# Patient Record
Sex: Male | Born: 1945 | Race: White | Hispanic: No | Marital: Married | Smoking: Former smoker
Health system: Southern US, Community
[De-identification: ages and names within clinical notes are randomized; demographics above are authoritative.]

## PROBLEM LIST (undated history)

## (undated) DIAGNOSIS — C801 Malignant (primary) neoplasm, unspecified: Secondary | ICD-10-CM

## (undated) DIAGNOSIS — G8929 Other chronic pain: Secondary | ICD-10-CM

## (undated) DIAGNOSIS — N400 Enlarged prostate without lower urinary tract symptoms: Secondary | ICD-10-CM

## (undated) DIAGNOSIS — U071 COVID-19: Secondary | ICD-10-CM

## (undated) DIAGNOSIS — M4800 Spinal stenosis, site unspecified: Secondary | ICD-10-CM

## (undated) DIAGNOSIS — M1990 Unspecified osteoarthritis, unspecified site: Secondary | ICD-10-CM

## (undated) DIAGNOSIS — R519 Headache, unspecified: Secondary | ICD-10-CM

## (undated) DIAGNOSIS — K219 Gastro-esophageal reflux disease without esophagitis: Secondary | ICD-10-CM

## (undated) DIAGNOSIS — K589 Irritable bowel syndrome without diarrhea: Secondary | ICD-10-CM

## (undated) DIAGNOSIS — Z87898 Personal history of other specified conditions: Secondary | ICD-10-CM

## (undated) DIAGNOSIS — I4891 Unspecified atrial fibrillation: Secondary | ICD-10-CM

## (undated) DIAGNOSIS — Z9889 Other specified postprocedural states: Secondary | ICD-10-CM

## (undated) DIAGNOSIS — R112 Nausea with vomiting, unspecified: Secondary | ICD-10-CM

## (undated) DIAGNOSIS — R51 Headache: Secondary | ICD-10-CM

## (undated) DIAGNOSIS — R42 Dizziness and giddiness: Secondary | ICD-10-CM

## (undated) DIAGNOSIS — M75100 Unspecified rotator cuff tear or rupture of unspecified shoulder, not specified as traumatic: Secondary | ICD-10-CM

## (undated) DIAGNOSIS — J449 Chronic obstructive pulmonary disease, unspecified: Secondary | ICD-10-CM

## (undated) HISTORY — PX: UPPER GASTROINTESTINAL ENDOSCOPY: SHX188

## (undated) HISTORY — PX: NASAL POLYP SURGERY: SHX186

## (undated) HISTORY — PX: COLONOSCOPY: SHX174

## (undated) HISTORY — PX: HERNIA REPAIR: SHX51

---

## 2004-07-04 ENCOUNTER — Ambulatory Visit: Payer: Self-pay | Admitting: Rheumatology

## 2005-04-24 ENCOUNTER — Ambulatory Visit: Payer: Self-pay | Admitting: General Practice

## 2005-05-12 ENCOUNTER — Emergency Department: Payer: Self-pay | Admitting: Emergency Medicine

## 2005-05-12 ENCOUNTER — Other Ambulatory Visit: Payer: Self-pay

## 2005-06-21 ENCOUNTER — Ambulatory Visit: Payer: Self-pay | Admitting: Pain Medicine

## 2005-06-29 ENCOUNTER — Ambulatory Visit: Payer: Self-pay | Admitting: Pain Medicine

## 2005-07-05 ENCOUNTER — Ambulatory Visit: Payer: Self-pay | Admitting: Pain Medicine

## 2005-07-27 ENCOUNTER — Ambulatory Visit: Payer: Self-pay | Admitting: Pain Medicine

## 2005-08-17 ENCOUNTER — Ambulatory Visit: Payer: Self-pay | Admitting: Pain Medicine

## 2005-08-31 ENCOUNTER — Ambulatory Visit: Payer: Self-pay | Admitting: Pain Medicine

## 2005-09-18 ENCOUNTER — Ambulatory Visit: Payer: Self-pay | Admitting: Pain Medicine

## 2005-10-03 ENCOUNTER — Encounter: Payer: Self-pay | Admitting: Pain Medicine

## 2005-10-05 ENCOUNTER — Ambulatory Visit: Payer: Self-pay | Admitting: Pain Medicine

## 2005-10-06 ENCOUNTER — Encounter: Payer: Self-pay | Admitting: Pain Medicine

## 2005-11-01 ENCOUNTER — Ambulatory Visit: Payer: Self-pay | Admitting: Pain Medicine

## 2005-11-29 ENCOUNTER — Ambulatory Visit: Payer: Self-pay | Admitting: Pain Medicine

## 2006-01-10 ENCOUNTER — Ambulatory Visit: Payer: Self-pay | Admitting: Pain Medicine

## 2006-02-07 ENCOUNTER — Ambulatory Visit: Payer: Self-pay | Admitting: Physician Assistant

## 2006-03-12 ENCOUNTER — Ambulatory Visit: Payer: Self-pay | Admitting: Pain Medicine

## 2006-04-06 ENCOUNTER — Ambulatory Visit: Payer: Self-pay | Admitting: Physician Assistant

## 2006-05-08 HISTORY — PX: NECK SURGERY: SHX720

## 2006-05-16 ENCOUNTER — Ambulatory Visit: Payer: Self-pay | Admitting: Pain Medicine

## 2006-06-14 ENCOUNTER — Ambulatory Visit: Payer: Self-pay | Admitting: Physician Assistant

## 2006-07-13 ENCOUNTER — Ambulatory Visit: Payer: Self-pay | Admitting: Physician Assistant

## 2006-08-14 ENCOUNTER — Ambulatory Visit: Payer: Self-pay | Admitting: Physician Assistant

## 2006-08-21 ENCOUNTER — Ambulatory Visit: Payer: Self-pay | Admitting: Internal Medicine

## 2006-09-14 ENCOUNTER — Ambulatory Visit: Payer: Self-pay | Admitting: Physician Assistant

## 2006-10-19 ENCOUNTER — Ambulatory Visit: Payer: Self-pay | Admitting: Physician Assistant

## 2006-11-14 ENCOUNTER — Ambulatory Visit: Payer: Self-pay | Admitting: Physician Assistant

## 2006-12-14 ENCOUNTER — Ambulatory Visit: Payer: Self-pay | Admitting: Physician Assistant

## 2006-12-28 ENCOUNTER — Ambulatory Visit: Payer: Self-pay | Admitting: Physician Assistant

## 2007-04-30 ENCOUNTER — Emergency Department: Payer: Self-pay | Admitting: Emergency Medicine

## 2007-06-18 ENCOUNTER — Ambulatory Visit: Payer: Self-pay | Admitting: Physician Assistant

## 2009-02-17 ENCOUNTER — Ambulatory Visit: Payer: Self-pay | Admitting: Internal Medicine

## 2011-04-18 ENCOUNTER — Ambulatory Visit: Payer: Self-pay | Admitting: Internal Medicine

## 2011-12-19 ENCOUNTER — Ambulatory Visit: Payer: Self-pay

## 2012-06-14 ENCOUNTER — Emergency Department: Payer: Self-pay | Admitting: Emergency Medicine

## 2012-06-14 LAB — BASIC METABOLIC PANEL
Anion Gap: 6 — ABNORMAL LOW (ref 7–16)
BUN: 22 mg/dL — ABNORMAL HIGH (ref 7–18)
Calcium, Total: 8.6 mg/dL (ref 8.5–10.1)
Chloride: 103 mmol/L (ref 98–107)
Co2: 31 mmol/L (ref 21–32)
EGFR (African American): >60
Glucose: 91 mg/dL (ref 65–99)
Potassium: 3.9 mmol/L (ref 3.5–5.1)
Sodium: 140 mmol/L (ref 136–145)

## 2012-06-14 LAB — CK TOTAL AND CKMB (NOT AT ARMC): CK, Total: 59 U/L (ref 35–232)

## 2012-06-14 LAB — CBC
Platelet: 290 10*3/uL (ref 150–440)
WBC: 7 10*3/uL (ref 3.8–10.6)

## 2012-06-14 LAB — TROPONIN I: Troponin-I: <0.02 ng/mL

## 2012-06-27 ENCOUNTER — Ambulatory Visit: Payer: Self-pay | Admitting: Otolaryngology

## 2013-04-02 ENCOUNTER — Ambulatory Visit: Payer: Self-pay | Admitting: Otolaryngology

## 2014-02-09 ENCOUNTER — Ambulatory Visit: Payer: Self-pay | Admitting: Otolaryngology

## 2014-03-18 ENCOUNTER — Ambulatory Visit: Payer: Self-pay | Admitting: Unknown Physician Specialty

## 2014-03-20 LAB — EXPECTORATED SPUTUM ASSESSMENT W REFEX TO RESP CULTURE

## 2014-10-08 ENCOUNTER — Other Ambulatory Visit: Payer: Self-pay | Admitting: Otolaryngology

## 2014-10-08 DIAGNOSIS — R5382 Chronic fatigue, unspecified: Secondary | ICD-10-CM

## 2014-10-08 DIAGNOSIS — R911 Solitary pulmonary nodule: Secondary | ICD-10-CM

## 2014-10-08 DIAGNOSIS — R059 Cough, unspecified: Secondary | ICD-10-CM

## 2014-10-08 DIAGNOSIS — R05 Cough: Secondary | ICD-10-CM

## 2014-10-08 DIAGNOSIS — J320 Chronic maxillary sinusitis: Secondary | ICD-10-CM

## 2014-10-14 ENCOUNTER — Ambulatory Visit: Admission: RE | Admit: 2014-10-14 | Payer: BLUE CROSS/BLUE SHIELD | Source: Ambulatory Visit

## 2014-10-14 ENCOUNTER — Ambulatory Visit
Admission: RE | Admit: 2014-10-14 | Discharge: 2014-10-14 | Disposition: A | Discharge status: Home / Self Care | Payer: BLUE CROSS/BLUE SHIELD | Source: Ambulatory Visit | Attending: Otolaryngology | Admitting: Otolaryngology

## 2014-10-14 ENCOUNTER — Ambulatory Visit: Payer: BLUE CROSS/BLUE SHIELD

## 2014-10-14 DIAGNOSIS — R911 Solitary pulmonary nodule: Secondary | ICD-10-CM | POA: Diagnosis present

## 2014-10-14 DIAGNOSIS — J320 Chronic maxillary sinusitis: Secondary | ICD-10-CM

## 2014-10-14 DIAGNOSIS — J439 Emphysema, unspecified: Secondary | ICD-10-CM | POA: Insufficient documentation

## 2014-10-14 DIAGNOSIS — R059 Cough, unspecified: Secondary | ICD-10-CM

## 2014-10-14 DIAGNOSIS — R918 Other nonspecific abnormal finding of lung field: Secondary | ICD-10-CM | POA: Diagnosis not present

## 2014-10-14 DIAGNOSIS — I700 Atherosclerosis of aorta: Secondary | ICD-10-CM | POA: Diagnosis not present

## 2014-10-14 DIAGNOSIS — R5383 Other fatigue: Secondary | ICD-10-CM | POA: Diagnosis present

## 2014-10-14 DIAGNOSIS — R05 Cough: Secondary | ICD-10-CM

## 2014-10-14 DIAGNOSIS — R5382 Chronic fatigue, unspecified: Secondary | ICD-10-CM

## 2015-02-05 DIAGNOSIS — Z8719 Personal history of other diseases of the digestive system: Secondary | ICD-10-CM | POA: Insufficient documentation

## 2015-02-05 DIAGNOSIS — K219 Gastro-esophageal reflux disease without esophagitis: Secondary | ICD-10-CM | POA: Insufficient documentation

## 2015-02-05 DIAGNOSIS — K581 Irritable bowel syndrome with constipation: Secondary | ICD-10-CM | POA: Insufficient documentation

## 2015-02-05 DIAGNOSIS — Z87898 Personal history of other specified conditions: Secondary | ICD-10-CM | POA: Insufficient documentation

## 2015-02-05 DIAGNOSIS — Z86010 Personal history of colonic polyps: Secondary | ICD-10-CM | POA: Insufficient documentation

## 2015-04-02 DIAGNOSIS — E781 Pure hyperglyceridemia: Secondary | ICD-10-CM | POA: Insufficient documentation

## 2015-05-26 ENCOUNTER — Encounter: Payer: Self-pay | Admitting: *Deleted

## 2015-06-01 NOTE — Discharge Instructions (Signed)
[facility] °[facility] °ENDOSCOPIC SINUS SURGERY °[facility] ° °What is Functional Endoscopic Sinus Surgery? ° The Surgery involves making the natural openings of the sinuses larger by removing the bony partitions that separate the sinuses from the nasal cavity.  The natural sinus lining is preserved as much as possible to allow the sinuses to resume normal function after the surgery.  In some patients nasal polyps (excessively swollen lining of the sinuses) may be removed to relieve obstruction of the sinus openings.  The surgery is performed through the nose using lighted scopes, which eliminates the need for incisions on the face.  A septoplasty is a different procedure which is sometimes performed with sinus surgery.  It involves straightening the boy partition that separates the two sides of your nose.  A crooked or deviated septum may need repair if is obstructing the sinuses or nasal airflow.  Turbinate reduction is also often performed during sinus surgery.  The turbinates are bony proturberances from the side walls of the nose which swell and can obstruct the nose in patients with sinus and allergy problems.  Their size can be surgically reduced to help relieve nasal obstruction. ° °What Can Sinus Surgery Do For Me? ° Sinus surgery can reduce the frequency of sinus infections requiring antibiotic treatment.  This can provide improvement in nasal congestion, post-nasal drainage, facial pressure and nasal obstruction.  Surgery will NOT prevent you from ever having an infection again, so it usually only for patients who get infections 4 or more times yearly requiring antibiotics, or for infections that do not clear with antibiotics.  It will not cure nasal allergies, so patients with allergies may still require medication to treat their allergies after surgery. Surgery may improve headaches related to sinusitis, however, some people will continue to  require medication to control sinus headaches related to allergies.  Surgery will do nothing for other forms of headache (migraine, tension or cluster). ° °What Are the Risks of Endoscopic Sinus Surgery? ° Current techniques allow surgery to be performed safely with little risk, however, there are rare complications that patients should be aware of.  Because the sinuses are located around the eyes, there is risk of eye injury, including blindness, though again, this would be quite rare. This is usually a result of bleeding behind the eye during surgery, which puts the vision oat risk, though there are treatments to protect the vision and prevent permanent disrupted by surgery causing a leak of the spinal fluid that surrounds the brain.  More serious complications would include bleeding inside the brain cavity or damage to the brain.  Again, all of these complications are uncommon, and spinal fluid leaks can be safely managed surgically if they occur.  The most common complication of sinus surgery is bleeding from the nose, which may require packing or cauterization of the nose.  Continued sinus have polyps may experience recurrence of the polyps requiring revision surgery.  Alterations of sense of smell or injury to the tear ducts are also rare complications.  ° °What is the Surgery Like, and what is the Recovery? ° The Surgery usually takes a couple of hours to perform, and is usually performed under a general anesthetic (completely asleep).  Patients are usually discharged home after a couple of hours.  Sometimes during surgery it is necessary to pack the nose to control bleeding, and the packing is left in place for 24 - 48 hours, and removed by your surgeon.    If a septoplasty was performed during the procedure, there is often a splint placed which must be removed after 5-7 days.   °Discomfort: Pain is usually mild to moderate, and can be controlled by prescription pain medication or acetaminophen (Tylenol).   Aspirin, Ibuprofen (Advil, Motrin), or Naprosyn (Aleve) should be avoided, as they can cause increased bleeding.  Most patients feel sinus pressure like they have a bad head cold for several days.  Sleeping with your head elevated can help reduce swelling and facial pressure, as can ice packs over the face.  A humidifier may be helpful to keep the mucous and blood from drying in the nose.  ° °Diet: There are no specific diet restrictions, however, you should generally start with clear liquids and a light diet of bland foods because the anesthetic can cause some nausea.  Advance your diet depending on how your stomach feels.  Taking your pain medication with food will often help reduce stomach upset which pain medications can cause. ° °Nasal Saline Irrigation: It is important to remove blood clots and dried mucous from the nose as it is healing.  This is done by having you irrigate the nose at least 3 - 4 times daily with a salt water solution.  We recommend using NeilMed Sinus Rinse (available at the drug store).  Fill the squeeze bottle with the solution, bend over a sink, and insert the tip of the squeeze bottle into the nose ½ of an inch.  Point the tip of the squeeze bottle towards the inside corner of the eye on the same side your irrigating.  Squeeze the bottle and gently irrigate the nose.  If you bend forward as you do this, most of the fluid will flow back out of the nose, instead of down your throat.   The solution should be warm, near body temperature, when you irrigate.   Each time you irrigate, you should use a full squeeze bottle.  ° °Note that if you are instructed to use Nasal Steroid Sprays at any time after your surgery, irrigate with saline BEFORE using the steroid spray, so you do not wash it all out of the nose. °Another product, Nasal Saline Gel (such as AYR Nasal Saline Gel) can be applied in each nostril 3 - 4 times daily to moisture the nose and reduce scabbing or crusting. ° °Bleeding:   Bloody drainage from the nose can be expected for several days, and patients are instructed to irrigate their nose frequently with salt water to help remove mucous and blood clots.  The drainage may be dark red or brown, though some fresh blood may be seen intermittently, especially after irrigation.  Do not blow you nose, as bleeding may occur. If you must sneeze, keep your mouth open to allow air to escape through your mouth. ° °If heavy bleeding occurs: Irrigate the nose with saline to rinse out clots, then spray the nose 3 - 4 times with Afrin Nasal Decongestant Spray.  The spray will constrict the blood vessels to slow bleeding.  Pinch the lower half of your nose shut to apply pressure, and lay down with your head elevated.  Ice packs over the nose may help as well. If bleeding persists despite these measures, you should notify your doctor.  Do not use the Afrin routinely to control nasal congestion after surgery, as it can result in worsening congestion and may affect healing.  ° ° ° °Activity: Return to work varies among patients. Most patients will be   out of work at least 5 - 7 days to recover.  Patient may return to work after they are off of narcotic pain medication, and feeling well enough to perform the functions of their job.  Patients must avoid heavy lifting (over 10 pounds) or strenuous physical for 2 weeks after surgery, so your employer may need to assign you to light duty, or keep you out of work longer if light duty is not possible.  NOTE: you should not drive, operate dangerous machinery, do any mentally demanding tasks or make any important legal or financial decisions while on narcotic pain medication and recovering from the general anesthetic.  °  °Call Your Doctor Immediately if You Have Any of the Following: °1. Bleeding that you cannot control with the above measures °2. Loss of vision, double vision, bulging of the eye or black eyes. °3. Fever over 101 degrees °4. Neck stiffness with  severe headache, fever, nausea and change in mental state. °You are always encourage to call anytime with concerns, however, please call with requests for pain medication refills during office hours. ° °Office Endoscopy: During follow-up visits your doctor will remove any packing or splints that may have been placed and evaluate and clean your sinuses endoscopically.  Topical anesthetic will be used to make this as comfortable as possible, though you may want to take your pain medication prior to the visit.  How often this will need to be done varies from patient to patient.  After complete recovery from the surgery, you may need follow-up endoscopy from time to time, particularly if there is concern of recurrent infection or nasal polyps. ° °General Anesthesia, Adult, Care After °Refer to this sheet in the next few weeks. These instructions provide you with information on caring for yourself after your procedure. Your health care provider may also give you more specific instructions. Your treatment has been planned according to current medical practices, but problems sometimes occur. Call your health care provider if you have any problems or questions after your procedure. °WHAT TO EXPECT AFTER THE PROCEDURE °After the procedure, it is typical to experience: °· Sleepiness. °· Nausea and vomiting. °HOME CARE INSTRUCTIONS °· For the first 24 hours after general anesthesia: °¨ Have a responsible person with you. °¨ Do not drive a car. If you are alone, do not take public transportation. °¨ Do not drink alcohol. °¨ Do not take medicine that has not been prescribed by your health care provider. °¨ Do not sign important papers or make important decisions. °¨ You may resume a normal diet and activities as directed by your health care provider. °· Change bandages (dressings) as directed. °· If you have questions or problems that seem related to general anesthesia, call the hospital and ask for the anesthetist or  anesthesiologist on call. °SEEK MEDICAL CARE IF: °· You have nausea and vomiting that continue the day after anesthesia. °· You develop a rash. °SEEK IMMEDIATE MEDICAL CARE IF:  °· You have difficulty breathing. °· You have chest pain. °· You have any allergic problems. °  °This information is not intended to replace advice given to you by your health care provider. Make sure you discuss any questions you have with your health care provider. °  °Document Released: 07/31/2000 Document Revised: 05/15/2014 Document Reviewed: 08/23/2011 °Elsevier Interactive Patient Education ©2016 [employer] ° °

## 2015-06-02 ENCOUNTER — Ambulatory Visit: Payer: BLUE CROSS/BLUE SHIELD | Admitting: Anesthesiology

## 2015-06-02 ENCOUNTER — Encounter
Admission: RE | Disposition: A | Discharge status: Home / Self Care | Payer: Self-pay | Source: Ambulatory Visit | Attending: Otolaryngology

## 2015-06-02 ENCOUNTER — Ambulatory Visit
Admission: RE | Admit: 2015-06-02 | Discharge: 2015-06-02 | Disposition: A | Discharge status: Home / Self Care | Payer: BLUE CROSS/BLUE SHIELD | Source: Ambulatory Visit | Attending: Otolaryngology | Admitting: Otolaryngology

## 2015-06-02 DIAGNOSIS — K219 Gastro-esophageal reflux disease without esophagitis: Secondary | ICD-10-CM | POA: Diagnosis not present

## 2015-06-02 DIAGNOSIS — J449 Chronic obstructive pulmonary disease, unspecified: Secondary | ICD-10-CM | POA: Diagnosis not present

## 2015-06-02 DIAGNOSIS — F17200 Nicotine dependence, unspecified, uncomplicated: Secondary | ICD-10-CM | POA: Insufficient documentation

## 2015-06-02 DIAGNOSIS — J329 Chronic sinusitis, unspecified: Secondary | ICD-10-CM | POA: Insufficient documentation

## 2015-06-02 HISTORY — DX: Headache, unspecified: R51.9

## 2015-06-02 HISTORY — PX: EXCISION CHONCHA BULLOSA: SHX6435

## 2015-06-02 HISTORY — DX: Nausea with vomiting, unspecified: R11.2

## 2015-06-02 HISTORY — DX: Dizziness and giddiness: R42

## 2015-06-02 HISTORY — DX: Unspecified rotator cuff tear or rupture of unspecified shoulder, not specified as traumatic: M75.100

## 2015-06-02 HISTORY — DX: Headache: R51

## 2015-06-02 HISTORY — DX: Gastro-esophageal reflux disease without esophagitis: K21.9

## 2015-06-02 HISTORY — DX: Unspecified osteoarthritis, unspecified site: M19.90

## 2015-06-02 HISTORY — DX: Other specified postprocedural states: Z98.890

## 2015-06-02 HISTORY — DX: Chronic obstructive pulmonary disease, unspecified: J44.9

## 2015-06-02 HISTORY — PX: MAXILLARY ANTROSTOMY: SHX2003

## 2015-06-02 SURGERY — MAXILLARY ANTROSTOMY
Anesthesia: General | Site: Nose | Laterality: Bilateral | Wound class: Clean Contaminated

## 2015-06-02 MED ORDER — LACTATED RINGERS IV SOLN
INTRAVENOUS | Status: DC
  Administered 2015-06-02: 09:00:00 via INTRAVENOUS

## 2015-06-02 MED ORDER — DEXAMETHASONE SODIUM PHOSPHATE 4 MG/ML IJ SOLN
INTRAMUSCULAR | Status: DC | PRN
  Administered 2015-06-02: 8 mg via INTRAVENOUS

## 2015-06-02 MED ORDER — PROMETHAZINE HCL 12.5 MG PO TABS: 12.5000 mg | ORAL_TABLET | Freq: Four times a day (QID) | ORAL | Status: DC | PRN

## 2015-06-02 MED ORDER — ONDANSETRON HCL 4 MG/2ML IJ SOLN
4.0000 mg | Freq: Once | INTRAMUSCULAR | Status: AC
  Administered 2015-06-02: 4 mg via INTRAVENOUS

## 2015-06-02 MED ORDER — LACTATED RINGERS IV SOLN: 500.0000 mL | INTRAVENOUS | Status: DC

## 2015-06-02 MED ORDER — FENTANYL CITRATE (PF) 100 MCG/2ML IJ SOLN
INTRAMUSCULAR | Status: DC | PRN
  Administered 2015-06-02: 100 ug via INTRAVENOUS

## 2015-06-02 MED ORDER — PROPOFOL 10 MG/ML IV BOLUS
INTRAVENOUS | Status: DC | PRN
  Administered 2015-06-02: 150 mg via INTRAVENOUS

## 2015-06-02 MED ORDER — ROCURONIUM BROMIDE 100 MG/10ML IV SOLN
INTRAVENOUS | Status: DC | PRN
  Administered 2015-06-02: 20 mg via INTRAVENOUS

## 2015-06-02 MED ORDER — SUCCINYLCHOLINE CHLORIDE 20 MG/ML IJ SOLN
INTRAMUSCULAR | Status: DC | PRN
  Administered 2015-06-02: 100 mg via INTRAVENOUS

## 2015-06-02 MED ORDER — OXYCODONE HCL 5 MG/5ML PO SOLN: 5.0000 mg | Freq: Once | ORAL | Status: AC | PRN

## 2015-06-02 MED ORDER — ALBUTEROL SULFATE HFA 108 (90 BASE) MCG/ACT IN AERS
2.0000 | INHALATION_SPRAY | RESPIRATORY_TRACT | Status: AC
  Administered 2015-06-02: 2 via RESPIRATORY_TRACT

## 2015-06-02 MED ORDER — ACETAMINOPHEN 10 MG/ML IV SOLN
1000.0000 mg | Freq: Once | INTRAVENOUS | Status: AC
  Administered 2015-06-02: 1000 mg via INTRAVENOUS

## 2015-06-02 MED ORDER — LIDOCAINE HCL (CARDIAC) 20 MG/ML IV SOLN
INTRAVENOUS | Status: DC | PRN
  Administered 2015-06-02: 50 mg via INTRAVENOUS

## 2015-06-02 MED ORDER — OXYCODONE-ACETAMINOPHEN 5-325 MG PO TABS: 1.0000 | ORAL_TABLET | ORAL | Status: DC | PRN

## 2015-06-02 MED ORDER — HYDROMORPHONE HCL 1 MG/ML IJ SOLN
0.2500 mg | INTRAMUSCULAR | Status: DC | PRN
  Administered 2015-06-02 (×2): 0.3 mg via INTRAVENOUS
  Administered 2015-06-02: 0.4 mg via INTRAVENOUS

## 2015-06-02 MED ORDER — ONDANSETRON HCL 4 MG/2ML IJ SOLN
4.0000 mg | Freq: Once | INTRAMUSCULAR | Status: AC | PRN
  Administered 2015-06-02: 4 mg via INTRAVENOUS

## 2015-06-02 MED ORDER — FENTANYL CITRATE (PF) 100 MCG/2ML IJ SOLN
25.0000 ug | INTRAMUSCULAR | Status: DC | PRN
  Administered 2015-06-02 (×4): 25 ug via INTRAVENOUS

## 2015-06-02 MED ORDER — MIDAZOLAM HCL 5 MG/5ML IJ SOLN
INTRAMUSCULAR | Status: DC | PRN
  Administered 2015-06-02: 2 mg via INTRAVENOUS

## 2015-06-02 MED ORDER — OXYMETAZOLINE HCL 0.05 % NA SOLN
NASAL | Status: DC | PRN
  Administered 2015-06-02: 1 via TOPICAL

## 2015-06-02 MED ORDER — OXYCODONE HCL 5 MG PO TABS
5.0000 mg | ORAL_TABLET | Freq: Once | ORAL | Status: AC | PRN
  Administered 2015-06-02: 5 mg via ORAL

## 2015-06-02 MED ORDER — SULFAMETHOXAZOLE-TRIMETHOPRIM 800-160 MG PO TABS: 1.0000 | ORAL_TABLET | Freq: Two times a day (BID) | ORAL | Status: DC

## 2015-06-02 MED ORDER — LIDOCAINE-EPINEPHRINE 1 %-1:100000 IJ SOLN
INTRAMUSCULAR | Status: DC | PRN
  Administered 2015-06-02: 4 mL

## 2015-06-02 SURGICAL SUPPLY — 25 items
BATTERY INSTRU NAVIGATION (MISCELLANEOUS) ×12 IMPLANT
CANISTER SUCT 1200ML W/VALVE (MISCELLANEOUS) ×3 IMPLANT
COAG SUCT 10F 3.5MM HAND CTRL (MISCELLANEOUS) ×3 IMPLANT
DEVICE INFLATION 20/61 (MISCELLANEOUS) IMPLANT
DRAPE HEAD BAR (DRAPES) ×3 IMPLANT
DRESSING NASL FOAM PST OP SINU (MISCELLANEOUS) IMPLANT
DRSG NASAL 4CM NASOPORE (MISCELLANEOUS) IMPLANT
DRSG NASAL FOAM POST OP SINU (MISCELLANEOUS)
GLOVE BIO SURGEON STRL SZ7.5 (GLOVE) ×9 IMPLANT
IRRIGATOR 4MM STR (IRRIGATION / IRRIGATOR) ×3 IMPLANT
IV NS 500ML (IV SOLUTION) ×2
IV NS 500ML BAXH (IV SOLUTION) ×1 IMPLANT
KIT ROOM TURNOVER OR (KITS) ×3 IMPLANT
NAVIGATION MASK REG  ST (MISCELLANEOUS) ×3 IMPLANT
NS IRRIG 500ML POUR BTL (IV SOLUTION) ×3 IMPLANT
PACK DRAPE NASAL/ENT (PACKS) ×3 IMPLANT
PACKING NASAL EPIS 4X2.4 XEROG (MISCELLANEOUS) IMPLANT
PAD GROUND ADULT SPLIT (MISCELLANEOUS) ×3 IMPLANT
PATTIES SURGICAL .5 X3 (DISPOSABLE) ×3 IMPLANT
SET HANDPIECE IRR DIEGO (MISCELLANEOUS) ×3 IMPLANT
SOL ANTI-FOG 6CC FOG-OUT (MISCELLANEOUS) ×1 IMPLANT
SOL FOG-OUT ANTI-FOG 6CC (MISCELLANEOUS) ×2
STRAP BODY AND KNEE 60X3 (MISCELLANEOUS) ×6 IMPLANT
SYRINGE 10CC LL (SYRINGE) ×3 IMPLANT
WATER STERILE IRR 500ML POUR (IV SOLUTION) IMPLANT

## 2015-06-02 NOTE — Anesthesia Postprocedure Evaluation (Signed)
Anesthesia Post Note  Patient: Riley Martin  Procedure(s) Performed: Procedure(s) (LRB): MAXILLARY ANTROSTOMY (Bilateral) BILATERAL CHONCHA BULLOSA (Bilateral)  Patient location during evaluation: PACU Anesthesia Type: General Level of consciousness: awake and alert Pain management: pain level controlled Vital Signs Assessment: post-procedure vital signs reviewed and stable Respiratory status: spontaneous breathing, nonlabored ventilation and respiratory function stable Cardiovascular status: blood pressure returned to baseline and stable Postop Assessment: no signs of nausea or vomiting Anesthetic complications: no    DANIEL D KOVACS

## 2015-06-02 NOTE — Progress Notes (Signed)
MD vaught notified that pt stating pain was 10/10. No new orders at this time

## 2015-06-02 NOTE — Progress Notes (Signed)
Pt stating his pain was much improved with pain medicine. Pt stated that he normally drinks coffee everyday, pt given 2 cups of coffee stating he believed that may have helped his headache

## 2015-06-02 NOTE — Anesthesia Preprocedure Evaluation (Addendum)
Anesthesia Evaluation  Patient identified by MRN, date of birth, ID band Patient awake    Reviewed: Allergy & Precautions, H&P , NPO status , Patient's Chart, lab work & pertinent test results, reviewed documented beta blocker date and time   History of Anesthesia Complications (+) PONV and history of anesthetic complications  Airway Mallampati: II  TM Distance: >3 FB Neck ROM: full    Dental no notable dental hx.    Pulmonary COPD,  COPD inhaler, Current Smoker,   Mild expiratory wheeze at the bases B/L Pulmonary exam normal  + wheezing      Cardiovascular Exercise Tolerance: Good negative cardio ROS   Rhythm:regular Rate:Normal     Neuro/Psych  Headaches, negative psych ROS   GI/Hepatic Neg liver ROS, GERD  Medicated,  Endo/Other  negative endocrine ROS  Renal/GU negative Renal ROS  negative genitourinary   Musculoskeletal   Abdominal   Peds  Hematology negative hematology ROS (+)   Anesthesia Other Findings   Reproductive/Obstetrics negative OB ROS                            Anesthesia Physical Anesthesia Plan  ASA: II  Anesthesia Plan: General   Post-op Pain Management:    Induction:   Airway Management Planned:   Additional Equipment:   Intra-op Plan:   Post-operative Plan:   Informed Consent: I have reviewed the patients History and Physical, chart, labs and discussed the procedure including the risks, benefits and alternatives for the proposed anesthesia with the patient or authorized representative who has indicated his/her understanding and acceptance.   Dental Advisory Given  Plan Discussed with: CRNA  Anesthesia Plan Comments:         Anesthesia Quick Evaluation

## 2015-06-02 NOTE — Progress Notes (Signed)
Patient stating his pain is 10/10 but noted to fall asleep and sats drop to 88%. Will continue to monitor.

## 2015-06-02 NOTE — Transfer of Care (Signed)
Immediate Anesthesia Transfer of Care Note  Patient: Riley Martin  Procedure(s) Performed: Procedure(s): MAXILLARY ANTROSTOMY (Bilateral) BILATERAL CHONCHA BULLOSA (Bilateral)  Patient Location: PACU  Anesthesia Type: General  Level of Consciousness: awake, alert  and patient cooperative  Airway and Oxygen Therapy: Patient Spontanous Breathing and Patient connected to supplemental oxygen  Post-op Assessment: Post-op Vital signs reviewed, Patient's Cardiovascular Status Stable, Respiratory Function Stable, Patent Airway and No signs of Nausea or vomiting  Post-op Vital Signs: Reviewed and stable  Complications: No apparent anesthesia complications

## 2015-06-02 NOTE — Addendum Note (Signed)
Addendum  created 06/02/15 1155 by Esmeralda Links, MD   Modules edited: Orders, PRL Based Order Sets

## 2015-06-02 NOTE — Op Note (Signed)
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  The patient was taken to the operating room and with the patient in a comfortable supine position,  general orotracheal anesthesia was induced without difficulty.  A proper time-out was performed.  The Stryker image guidance system was set up and calibrated in the normal fashion with an acceptable error of 0.70mm.       The patient next received preoperative Afrin spray for topical decongestion and vasoconstriction and 1% Xylocaine with 1:100,000 epinephrine, 4 cc's, was infiltrated into the inferior turbinates, septum, and anterior middle turbinates bilaterally.  Several minutes were allowed for this to take effect.  Cottoniod pledgets soaked in Afrin were placed into both nasal cavities and left while the patient was prepped and draped in the standard fashion.  The materials were removed from the nose and observed to be intact and correct in number.  The nose was next inspected with a zero degree endoscope and the concha bullosa was noted to be blocking the airway bilaterally. Afin soaked pledgets were placed lateral to the turbinates for approximately one minute.  After this, a Soil scientist was used to open the left concha bullosa and the lateral wall was removed with Grimwald forceps.  Mucoid and polypoid contents was noted.  Hemostasis  was achieved with a Bovie suction cautery and the remaining medial wall of the concha bullosa was medialized and Afrin soaked pledgets were placed.  The patient's large obstructing right concha bullosa was opened in a the exact same manner with similar findings except no polypoid contents was noted.  At this time attention was directed to the patient's maxillary sinuses.  The patient's uncinate process was brought forward and infractured bilaterally with a ball tipped probe.  Using a pediatric backbiting forcep, the uncinate was removed bilaterally.  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  On the left, a ball tipped probe was placed through the natural ostia and this was used to create a larger opening.  Using a Diego microdebrider, the maxillary antrostomy was enlarged for a widely patent maxillary antrostomy.  This was repeated in a simlar fashion on the patient's right side.  Hemostasis was performed with topical Afrin soaked pledgets.  Visualization with a 30 degree endoscope was used to examine the bilateral maxillary antrostomies which were noted to be widely patent and in continuity with the natural os bilaterally.  At this time with all diseased sinuses opened, the patient's nasal cavity was examinated and copiously irrigated with sterile saline.  Meticulous hemostasis was continued and all sinuses were examined and noted to be widely patent.  Stamberger sinufoam was placed into the patient's open sinuses as well as lateral to the middle turbinate bilaterally.  Xerogel was placed lateral to the residual middle turbinate bilaterally to ensure proper medialization.  Hemostasis was continued and care of the patient was transferred to Anesthesia.  Dispo:   PACU to home  Plan: Ice, elevation, narcotic analgesia and prophylactic antibiotics f  We will reevaluate the patient in the office in 7 days.  Return to work in 7-10 days, strenuous activities in two  weeks.   Riley Martin 06/02/2015 10:53 AM

## 2015-06-02 NOTE — Anesthesia Procedure Notes (Addendum)
Procedure Name: Intubation Date/Time: 06/02/2015 9:43 AM Performed by: Londell Moh Pre-anesthesia Checklist: Patient identified, Emergency Drugs available, Suction available, Patient being monitored and Timeout performed Patient Re-evaluated:Patient Re-evaluated prior to inductionOxygen Delivery Method: Circle system utilized Preoxygenation: Pre-oxygenation with 100% oxygen Intubation Type: IV induction Ventilation: Mask ventilation without difficulty Laryngoscope Size: Mac and 3 Grade View: Grade I Tube type: Oral Rae Tube size: 7.5 mm Number of attempts: 1 Airway Equipment and Method: LTA kit utilized Placement Confirmation: ETT inserted through vocal cords under direct vision,  positive ETCO2 and breath sounds checked- equal and bilateral Tube secured with: Tape Dental Injury: Teeth and Oropharynx as per pre-operative assessment

## 2015-06-03 ENCOUNTER — Encounter: Payer: Self-pay | Admitting: Otolaryngology

## 2015-06-03 NOTE — H&P (Signed)
..  History and Physical paper copy reviewed and updated date of procedure and will be scanned into system.  

## 2015-06-04 LAB — SURGICAL PATHOLOGY

## 2015-12-17 ENCOUNTER — Other Ambulatory Visit: Payer: Self-pay | Admitting: Family Medicine

## 2015-12-17 DIAGNOSIS — R634 Abnormal weight loss: Secondary | ICD-10-CM

## 2015-12-21 ENCOUNTER — Ambulatory Visit
Admission: RE | Admit: 2015-12-21 | Discharge: 2015-12-21 | Disposition: A | Discharge status: Home / Self Care | Payer: BLUE CROSS/BLUE SHIELD | Source: Ambulatory Visit | Attending: Family Medicine | Admitting: Family Medicine

## 2015-12-21 DIAGNOSIS — M5136 Other intervertebral disc degeneration, lumbar region: Secondary | ICD-10-CM | POA: Insufficient documentation

## 2015-12-21 DIAGNOSIS — R634 Abnormal weight loss: Secondary | ICD-10-CM

## 2015-12-21 DIAGNOSIS — I700 Atherosclerosis of aorta: Secondary | ICD-10-CM | POA: Diagnosis not present

## 2015-12-21 DIAGNOSIS — K5730 Diverticulosis of large intestine without perforation or abscess without bleeding: Secondary | ICD-10-CM | POA: Diagnosis not present

## 2015-12-24 ENCOUNTER — Emergency Department
Admission: EM | Admit: 2015-12-24 | Discharge: 2015-12-24 | Disposition: A | Discharge status: Home / Self Care | Payer: BLUE CROSS/BLUE SHIELD | Attending: Emergency Medicine | Admitting: Emergency Medicine

## 2015-12-24 ENCOUNTER — Encounter: Payer: Self-pay | Admitting: Emergency Medicine

## 2015-12-24 DIAGNOSIS — J449 Chronic obstructive pulmonary disease, unspecified: Secondary | ICD-10-CM | POA: Diagnosis not present

## 2015-12-24 DIAGNOSIS — F17200 Nicotine dependence, unspecified, uncomplicated: Secondary | ICD-10-CM | POA: Diagnosis not present

## 2015-12-24 DIAGNOSIS — R531 Weakness: Secondary | ICD-10-CM | POA: Diagnosis present

## 2015-12-24 DIAGNOSIS — R112 Nausea with vomiting, unspecified: Secondary | ICD-10-CM | POA: Insufficient documentation

## 2015-12-24 DIAGNOSIS — Z79899 Other long term (current) drug therapy: Secondary | ICD-10-CM | POA: Insufficient documentation

## 2015-12-24 DIAGNOSIS — R197 Diarrhea, unspecified: Secondary | ICD-10-CM | POA: Insufficient documentation

## 2015-12-24 LAB — URINALYSIS COMPLETE WITH MICROSCOPIC (ARMC ONLY)
BACTERIA UA: NONE SEEN
Bilirubin Urine: NEGATIVE
GLUCOSE, UA: NEGATIVE mg/dL
HGB URINE DIPSTICK: NEGATIVE
Ketones, ur: NEGATIVE mg/dL
Leukocytes, UA: NEGATIVE
NITRITE: NEGATIVE
PH: 5 (ref 5.0–8.0)
PROTEIN: NEGATIVE mg/dL
SPECIFIC GRAVITY, URINE: 1.02 (ref 1.005–1.030)

## 2015-12-24 LAB — BASIC METABOLIC PANEL
ANION GAP: 7 (ref 5–15)
BUN: 22 mg/dL — ABNORMAL HIGH (ref 6–20)
CO2: 26 mmol/L (ref 22–32)
Calcium: 8.9 mg/dL (ref 8.9–10.3)
Chloride: 105 mmol/L (ref 101–111)
Creatinine, Ser: 1.17 mg/dL (ref 0.61–1.24)
GLUCOSE: 155 mg/dL — AB (ref 65–99)
POTASSIUM: 4 mmol/L (ref 3.5–5.1)
Sodium: 138 mmol/L (ref 135–145)

## 2015-12-24 LAB — CBC
HEMATOCRIT: 40.7 % (ref 40.0–52.0)
HEMOGLOBIN: 14.1 g/dL (ref 13.0–18.0)
MCH: 33.7 pg (ref 26.0–34.0)
MCHC: 34.6 g/dL (ref 32.0–36.0)
MCV: 97.2 fL (ref 80.0–100.0)
Platelets: 253 10*3/uL (ref 150–440)
RBC: 4.19 MIL/uL — AB (ref 4.40–5.90)
RDW: 12.3 % (ref 11.5–14.5)
WBC: 9.1 10*3/uL (ref 3.8–10.6)

## 2015-12-24 LAB — HEPATIC FUNCTION PANEL
ALBUMIN: 3.8 g/dL (ref 3.5–5.0)
ALK PHOS: 50 U/L (ref 38–126)
ALT: 10 U/L — ABNORMAL LOW (ref 17–63)
AST: 19 U/L (ref 15–41)
Bilirubin, Direct: <0.1 mg/dL — ABNORMAL LOW (ref 0.1–0.5)
TOTAL PROTEIN: 7.2 g/dL (ref 6.5–8.1)
Total Bilirubin: 0.4 mg/dL (ref 0.3–1.2)

## 2015-12-24 LAB — LIPASE, BLOOD: LIPASE: 13 U/L (ref 11–51)

## 2015-12-24 MED ORDER — SODIUM CHLORIDE 0.9 % IV BOLUS (SEPSIS)
1000.0000 mL | Freq: Once | INTRAVENOUS | Status: AC
  Administered 2015-12-24: 1000 mL via INTRAVENOUS

## 2015-12-24 MED ORDER — DICYCLOMINE HCL 10 MG/ML IM SOLN
20.0000 mg | Freq: Once | INTRAMUSCULAR | Status: AC
  Administered 2015-12-24: 20 mg via INTRAMUSCULAR
  Filled 2015-12-24: qty 2

## 2015-12-24 MED ORDER — KETOROLAC TROMETHAMINE 30 MG/ML IJ SOLN
30.0000 mg | Freq: Once | INTRAMUSCULAR | Status: AC
  Administered 2015-12-24: 30 mg via INTRAVENOUS
  Filled 2015-12-24: qty 1

## 2015-12-24 MED ORDER — ONDANSETRON 4 MG PO TBDP: ORAL_TABLET | ORAL | 0 refills | Status: DC

## 2015-12-24 MED ORDER — ONDANSETRON HCL 4 MG/2ML IJ SOLN
4.0000 mg | Freq: Once | INTRAMUSCULAR | Status: AC
  Administered 2015-12-24: 4 mg via INTRAVENOUS
  Filled 2015-12-24: qty 2

## 2015-12-24 MED ORDER — DICYCLOMINE HCL 10 MG PO CAPS: 10.0000 mg | ORAL_CAPSULE | Freq: Three times a day (TID) | ORAL | 0 refills | Status: DC | PRN

## 2015-12-24 NOTE — ED Triage Notes (Signed)
Pt to ed with c/o weakness x several days.  Pt family reports abd cramping and vomiting yesterday.  Reports had ct scan 3 days ago.  Pt also reports diarrhea.

## 2015-12-24 NOTE — Discharge Instructions (Signed)
Stay hydrated.   Take zofran for nausea.   Take bentyl for cramps.   Take tylenol, motrin for fevers.  See your doctor.   Return to ER if you have worse nausea, vomiting, dehydration, abdominal pain

## 2015-12-24 NOTE — ED Provider Notes (Signed)
[facility] Provider Note   CSN: [id number] Arrival date & time: 12/24/15  1318     History   Chief Complaint Chief Complaint  Patient presents with  . Weakness    HPI Riley Martin is a 70 y.o. male hx of COPD, GERD, here with abdominal pain, vomiting. Patient states that he has been having abdominal cramps for the last 4-5 days. Patient saw primary care doctor 3 days ago and had a CT abdomen pelvis that was unremarkable. He states that he had to drink PO contrast for the CT and since then has worsening abdominal cramps as well as loose stool and vomiting. Worsening vomiting and loose stools and abdominal cramps since yesterday. Denies any sick contacts or recent travel or fevers or bad food.  The history is provided by the patient.    Past Medical History:  Diagnosis Date  . Arthritis   . COPD (chronic obstructive pulmonary disease) ([place])   . GERD (gastroesophageal reflux disease)   . Headache   . Orthostatic dizziness   . PONV (postoperative nausea and vomiting)   . Torn rotator cuff    left    There are no active problems to display for this patient.   Past Surgical History:  Procedure Laterality Date  . COLONOSCOPY    . EXCISION CHONCHA BULLOSA Bilateral 06/02/2015   Procedure: BILATERAL CHONCHA BULLOSA;  Surgeon: Carloyn Manner, MD;  Location: [facility];  Service: ENT;  Laterality: Bilateral;  . MAXILLARY ANTROSTOMY Bilateral 06/02/2015   Procedure: MAXILLARY ANTROSTOMY;  Surgeon: Carloyn Manner, MD;  Location: [facility];  Service: ENT;  Laterality: Bilateral;  . NASAL POLYP SURGERY    . NECK SURGERY  2008   no limitations       Home Medications    Prior to Admission medications   Medication Sig Start Date End Date Taking? Authorizing Provider  Fluticasone-Salmeterol (ADVAIR) 500-50 MCG/DOSE AEPB Inhale 1 puff into the lungs 2 (two) times daily as needed.    Historical Provider, MD  omeprazole (PRILOSEC) 40 MG  capsule Take 40 mg by mouth 2 (two) times daily.    Historical Provider, MD  oxyCODONE-acetaminophen (ROXICET) 5-325 MG tablet Take 1-2 tablets by mouth every 4 (four) hours as needed for severe pain. 06/02/15   Carloyn Manner, MD  promethazine (PHENERGAN) 12.5 MG tablet Take 1 tablet (12.5 mg total) by mouth every 6 (six) hours as needed for nausea or vomiting. 06/02/15   Carloyn Manner, MD  sulfamethoxazole-trimethoprim (BACTRIM DS,SEPTRA DS) 800-160 MG tablet Take 1 tablet by mouth 2 (two) times daily. 06/02/15   Carloyn Manner, MD    Family History History reviewed. No pertinent family history.  Social History Social History  Substance Use Topics  . Smoking status: Current Every Day Smoker    Packs/day: 0.50    Years: 35.00  . Smokeless tobacco: Never Used  . Alcohol use No     Allergies   Ivp dye [iodinated diagnostic agents] and Tramadol   Review of Systems Review of Systems  Gastrointestinal: Positive for abdominal pain, diarrhea and vomiting.  All other systems reviewed and are negative.    Physical Exam Updated Vital Signs BP (!) 107/54 (BP Location: Right Arm)   Pulse 76   Temp 99 F (37.2 C) (Oral)   Resp 16   Ht 6' (1.829 m)   Wt 131 lb (59.4 kg)   SpO2 93%   BMI 17.77 kg/m   Physical Exam  Constitutional: He is oriented to person, place,  and time. He appears well-developed.  HENT:  Head: Normocephalic.  MM slightly dry   Eyes: EOM are normal. Pupils are equal, round, and reactive to light.  Neck: Normal range of motion. Neck supple.  Cardiovascular: Normal rate, regular rhythm and normal heart sounds.   Pulmonary/Chest: Effort normal and breath sounds normal.  Abdominal: Soft. Bowel sounds are normal. He exhibits no distension. There is no tenderness. There is no guarding.  Musculoskeletal: Normal range of motion.  Neurological: He is alert and oriented to person, place, and time.  Skin: Skin is warm.  Psychiatric: He has a normal mood and  affect.  Nursing note and vitals reviewed.    ED Treatments / Results  Labs (all labs ordered are listed, but only abnormal results are displayed) Labs Reviewed  BASIC METABOLIC PANEL - Abnormal; Notable for the following:       Result Value   Glucose, Bld 155 (*)    BUN 22 (*)    All other components within normal limits  CBC - Abnormal; Notable for the following:    RBC 4.19 (*)    All other components within normal limits  URINALYSIS COMPLETEWITH MICROSCOPIC ([facility] ONLY) - Abnormal; Notable for the following:    Color, Urine YELLOW (*)    APPearance CLEAR (*)    Squamous Epithelial / LPF 0-5 (*)    All other components within normal limits  HEPATIC FUNCTION PANEL - Abnormal; Notable for the following:    ALT 10 (*)    Bilirubin, Direct <0.1 (*)    All other components within normal limits  LIPASE, BLOOD    EKG  EKG Interpretation None      ED ECG REPORT I, Wandra Arthurs, the attending physician, personally viewed and interpreted this ECG.   Date: 12/24/2015  EKG Time: 13:30 pm  Rate: 111  Rhythm: sinus tachycardia  Axis: normal  Intervals:none  ST&T Change: PVC, nonspecific changes     Radiology No results found.  Procedures Procedures (including critical care time)  Medications Ordered in ED Medications  sodium chloride 0.9 % bolus 1,000 mL (0 mLs Intravenous Stopped 12/24/15 1806)  ondansetron (ZOFRAN) injection 4 mg (4 mg Intravenous Given 12/24/15 1658)  ketorolac (TORADOL) 30 MG/ML injection 30 mg (30 mg Intravenous Given 12/24/15 1658)  dicyclomine (BENTYL) injection 20 mg (20 mg Intramuscular Given 12/24/15 1658)  sodium chloride 0.9 % bolus 1,000 mL (0 mLs Intravenous Stopped 12/24/15 1933)     Initial Impression / Assessment and Plan / ED Course  I have reviewed the triage vital signs and the nursing notes.  Pertinent labs & imaging results that were available during my care of the patient were reviewed by me and considered in my medical  decision making (see chart for details).  Clinical Course    Riley Martin is a 70 y.o. male here with abdominal cramps, vomiting, diarrhea. CT 3 days ago unremarkable. He has low grade temp and slightly tachycardic in the ED. Likely gastro. Will get labs, UA. Will hydrate and give zofran and bentyl.   8:41 PM Tolerated PO fluids and food. Labs and UA unremarkable. Will dc home with zofran, bentyl.    Final Clinical Impressions(s) / ED Diagnoses   Final diagnoses:  None    New Prescriptions New Prescriptions   No medications on file     Drenda Freeze, MD 12/24/15 2041

## 2016-01-27 ENCOUNTER — Other Ambulatory Visit: Payer: Self-pay | Admitting: Physical Medicine and Rehabilitation

## 2016-01-27 DIAGNOSIS — M47812 Spondylosis without myelopathy or radiculopathy, cervical region: Secondary | ICD-10-CM

## 2016-02-05 ENCOUNTER — Ambulatory Visit
Admission: RE | Admit: 2016-02-05 | Discharge: 2016-02-05 | Disposition: A | Discharge status: Home / Self Care | Payer: BLUE CROSS/BLUE SHIELD | Source: Ambulatory Visit | Attending: Physical Medicine and Rehabilitation | Admitting: Physical Medicine and Rehabilitation

## 2016-02-05 DIAGNOSIS — M47812 Spondylosis without myelopathy or radiculopathy, cervical region: Secondary | ICD-10-CM

## 2016-06-21 DIAGNOSIS — Z7185 Encounter for immunization safety counseling: Secondary | ICD-10-CM | POA: Insufficient documentation

## 2016-06-21 DIAGNOSIS — Z7189 Other specified counseling: Secondary | ICD-10-CM | POA: Insufficient documentation

## 2016-06-21 DIAGNOSIS — Z862 Personal history of diseases of the blood and blood-forming organs and certain disorders involving the immune mechanism: Secondary | ICD-10-CM | POA: Insufficient documentation

## 2016-07-28 ENCOUNTER — Other Ambulatory Visit: Payer: Self-pay | Admitting: Otolaryngology

## 2016-07-28 ENCOUNTER — Ambulatory Visit
Admission: RE | Admit: 2016-07-28 | Discharge: 2016-07-28 | Disposition: A | Discharge status: Home / Self Care | Payer: BLUE CROSS/BLUE SHIELD | Source: Ambulatory Visit | Attending: Otolaryngology | Admitting: Otolaryngology

## 2016-07-28 DIAGNOSIS — R05 Cough: Secondary | ICD-10-CM

## 2016-07-28 DIAGNOSIS — R059 Cough, unspecified: Secondary | ICD-10-CM

## 2016-09-11 NOTE — Progress Notes (Signed)
[facility] Consultation      Assessment and Plan:  Asthmatic bronchitis-- bacterial (strep pneumonia and moraxella).  -Has already been treated appropriately with a course of Bactrim, which should be sufficient. --Will use Incruz inhaler once daily for 2 weeks, for residual bronchitis symptoms, in addition to his Advair, which should be continued.  Chronic rhinitis.  --Use flonase daily regularly. May be contributing to cough.   Nicotine abuse.  --Discussed the importance of smoking cessation, spent > 3 min in discussion.   Possible COPD.  --Will send for PFT.     Date: 09/11/2016  MRN# [id number] Riley Martin 10-Jun-1945    Riley Martin is a 71 y.o. old male seen in consultation for chief complaint of:    Chief Complaint  Patient presents with  . Advice Only    Referred by Dr. Pryor Ochoa cough. Patient says cough getting much better. He had pneumonia. Patient has some chest tightness.  . Cough    HPI:   The patient had been seeing Dr. Pryor Ochoa for sinus issues. His grand daughter is 79 yo and gives his several colds.  His most recent one was about 6 weeks ago he has had a persistent cough, he was producing a lot phlegm. He has been on advair and feels that it is helping. He does feel that his chest is a bit tight, he continues to smoke about half ppd. He notes that his chest is less open than it was before this came on him.  He has never quit for more than a day. He has tried chantix but did not like it, it made him depressed.   He has flonase but does not use it regularly. He does have reflux symptoms which are controlled with omeprazole every night, if he does not take it his symptoms are worse.   Images personally reviewed, chest x-ray; 07/28/16 ; normal. Lower cuts of lungs on CTAP 12/21/15; **CT chest 02/10/15; normal. Lungs, a few tiny lung nodules, unchanged from 2015.   **Review of outside ENT records, Dr. Pryor Ochoa; patient was seen for chronic  cough, history of chronic maxillary sinusitis. Has been noted to have periodic wheezing, was started empirically on Advair, sinus rinse. Upper respiratory culture. 07/19/16 is positive for heavy growth of both strep pneumonia and Moraxella catarrhalis. This appeared to be some sensitive to the Bactrim that the patient had been on  PMHX:   Past Medical History:  Diagnosis Date  . Arthritis   . COPD (chronic obstructive pulmonary disease) ([place])   . GERD (gastroesophageal reflux disease)   . Headache   . Orthostatic dizziness   . PONV (postoperative nausea and vomiting)   . Torn rotator cuff    left   Surgical Hx:  Past Surgical History:  Procedure Laterality Date  . COLONOSCOPY    . EXCISION CHONCHA BULLOSA Bilateral 06/02/2015   Procedure: BILATERAL CHONCHA BULLOSA;  Surgeon: Carloyn Manner, MD;  Location: [facility];  Service: ENT;  Laterality: Bilateral;  . MAXILLARY ANTROSTOMY Bilateral 06/02/2015   Procedure: MAXILLARY ANTROSTOMY;  Surgeon: Carloyn Manner, MD;  Location: [facility];  Service: ENT;  Laterality: Bilateral;  . NASAL POLYP SURGERY    . NECK SURGERY  2008   no limitations   Family Hx:  History reviewed. No pertinent family history. Social Hx:   Social History  Substance Use Topics  . Smoking status: Current Every Day Smoker    Packs/day: 0.50    Years: 35.00  . Smokeless  tobacco: Never Used  . Alcohol use No   Medication:   Reviewed.    Allergies:  Ivp dye [iodinated diagnostic agents] and Tramadol  Review of Systems: Gen:  Denies  fever, sweats, chills HEENT: Denies blurred vision, double vision. bleeds, sore throat Cvc:  No dizziness, chest pain. Resp:   Denies cough or sputum production, shortness of breath Gi: Denies swallowing difficulty, stomach pain. Gu:  Denies bladder incontinence, burning urine Ext:   No Joint pain, stiffness. Skin: No skin rash,  hives  Endoc:  No polyuria, polydipsia. Psych: No depression,  insomnia. Other:  All other systems were reviewed with the patient and were negative other that what is mentioned in the HPI.   Physical Examination:   VS: BP 110/64 (BP Location: Right Arm, Patient Position: Sitting, Cuff Size: Normal)   Pulse (!) 47   Resp 16   Ht 6' (1.829 m)   Wt 142 lb (64.4 kg)   SpO2 96%   BMI 19.26 kg/m   General Appearance: No distress  Neuro:without focal findings,  speech normal,  HEENT: PERRLA, EOM intact.   Pulmonary: normal breath sounds, No wheezing.  CardiovascularNormal S1,S2.  No m/r/g.   Abdomen: Benign, Soft, non-tender. Renal:  No costovertebral tenderness  GU:  No performed at this time. Endoc: No evident thyromegaly, no signs of acromegaly. Skin:   warm, no rashes, no ecchymosis  Extremities: normal, no cyanosis, clubbing.  Other findings:    LABORATORY PANEL:   CBC No results for input(s): WBC, HGB, HCT, PLT in the last 168 hours. ------------------------------------------------------------------------------------------------------------------  Chemistries  No results for input(s): NA, K, CL, CO2, GLUCOSE, BUN, CREATININE, CALCIUM, MG, AST, ALT, ALKPHOS, BILITOT in the last 168 hours.  Invalid input(s): GFRCGP ------------------------------------------------------------------------------------------------------------------  Cardiac Enzymes No results for input(s): TROPONINI in the last 168 hours. ------------------------------------------------------------  RADIOLOGY:  No results found.     Thank  you for the consultation and for allowing [facility] to assist in the care of your patient. Our recommendations are noted above.  Please contact us if we can be of further service.   Marda Stalker, MD.  Board Certified in Internal Medicine, Pulmonary Medicine, [place], and Sleep Medicine.  [facility] Office Number: [phone]  Patricia Pesa, M.D.  Merton Border, M.D  09/11/2016

## 2016-09-13 ENCOUNTER — Encounter: Payer: Self-pay | Admitting: Internal Medicine

## 2016-09-13 ENCOUNTER — Ambulatory Visit (INDEPENDENT_AMBULATORY_CARE_PROVIDER_SITE_OTHER): Payer: BLUE CROSS/BLUE SHIELD | Admitting: Internal Medicine

## 2016-09-13 VITALS — BP 110/64 | HR 47 | Resp 16 | Ht 72.0 in | Wt 142.0 lb

## 2016-09-13 DIAGNOSIS — R05 Cough: Secondary | ICD-10-CM | POA: Diagnosis not present

## 2016-09-13 DIAGNOSIS — J4541 Moderate persistent asthma with (acute) exacerbation: Secondary | ICD-10-CM

## 2016-09-13 DIAGNOSIS — M4800 Spinal stenosis, site unspecified: Secondary | ICD-10-CM | POA: Insufficient documentation

## 2016-09-13 DIAGNOSIS — N400 Enlarged prostate without lower urinary tract symptoms: Secondary | ICD-10-CM | POA: Insufficient documentation

## 2016-09-13 DIAGNOSIS — R059 Cough, unspecified: Secondary | ICD-10-CM

## 2016-09-13 DIAGNOSIS — F17200 Nicotine dependence, unspecified, uncomplicated: Secondary | ICD-10-CM | POA: Insufficient documentation

## 2016-09-13 MED ORDER — UMECLIDINIUM BROMIDE 62.5 MCG/INH IN AEPB: 1.0000 | INHALATION_SPRAY | Freq: Every day | RESPIRATORY_TRACT | 0 refills | Status: DC

## 2016-09-13 MED ORDER — FLUTICASONE PROPIONATE 50 MCG/ACT NA SUSP: 2.0000 | Freq: Every day | NASAL | 2 refills | Status: DC

## 2016-09-13 NOTE — Patient Instructions (Addendum)
--  Will perform lung function test ( complete) in 2 months.   --Will give sample of Incruz daily for 2 weeks. Then stop.   --Use flonase 2 sprays in each nostril once daily.   --Quitting smoking is the most important thing that you can do for your health.  --Quitting smoking will have greater affect on your health than any medicine that we can give you.

## 2016-11-14 ENCOUNTER — Ambulatory Visit: Payer: Medicare Other | Attending: Internal Medicine

## 2017-02-07 ENCOUNTER — Other Ambulatory Visit: Payer: Self-pay | Admitting: Internal Medicine

## 2017-02-20 ENCOUNTER — Other Ambulatory Visit: Payer: Self-pay | Admitting: Family Medicine

## 2017-02-20 DIAGNOSIS — R1032 Left lower quadrant pain: Secondary | ICD-10-CM

## 2017-05-03 ENCOUNTER — Other Ambulatory Visit: Payer: Self-pay | Admitting: Internal Medicine

## 2017-05-03 ENCOUNTER — Telehealth: Payer: Self-pay | Admitting: Internal Medicine

## 2017-05-03 DIAGNOSIS — J4520 Mild intermittent asthma, uncomplicated: Secondary | ICD-10-CM

## 2017-05-03 DIAGNOSIS — R059 Cough, unspecified: Secondary | ICD-10-CM

## 2017-05-03 DIAGNOSIS — R05 Cough: Secondary | ICD-10-CM

## 2017-05-03 DIAGNOSIS — J4541 Moderate persistent asthma with (acute) exacerbation: Secondary | ICD-10-CM

## 2017-05-03 NOTE — Telephone Encounter (Signed)
Scheduled for over due fu with Ram 1-28 needs pft prior to visit

## 2017-05-29 ENCOUNTER — Ambulatory Visit: Payer: BLUE CROSS/BLUE SHIELD | Attending: Internal Medicine

## 2017-05-29 DIAGNOSIS — J4541 Moderate persistent asthma with (acute) exacerbation: Secondary | ICD-10-CM | POA: Insufficient documentation

## 2017-05-29 DIAGNOSIS — R05 Cough: Secondary | ICD-10-CM | POA: Diagnosis not present

## 2017-05-29 DIAGNOSIS — R059 Cough, unspecified: Secondary | ICD-10-CM

## 2017-05-29 MED ORDER — ALBUTEROL SULFATE (2.5 MG/3ML) 0.083% IN NEBU
2.5000 mg | INHALATION_SOLUTION | Freq: Once | RESPIRATORY_TRACT | Status: AC
  Administered 2017-05-29: 2.5 mg via RESPIRATORY_TRACT
  Filled 2017-05-29: qty 3

## 2017-06-01 NOTE — Progress Notes (Addendum)
[facility] Consultation      Assessment and Plan:  COPD.  -Moderate COPD seen on most recent pulmonary function testing, with significant bronchial reactivity, likely related to smoking. --Restart Incruse inhaler.  --Discussed that if he stopped smoking he may be able to get off this inhaler. -Discussed lung cancer screening today, he is going to consider it. -Prevnar vaccine status unknown  Chronic rhinitis.  --doing better.   Nicotine abuse.  --Discussed the importance of smoking cessation, spent > 3 min in discussion.  -I sent the prescription for Chantix starter pack.  Meds ordered this encounter  Medications  . varenicline (CHANTIX STARTING MONTH PAK) 0.5 MG X 11 & 1 MG X 42 tablet    Sig: Take one 0.5 mg tablet by mouth once daily for 3 days, then increase to one 0.5 mg tablet twice daily for 4 days, then increase to one 1 mg tablet twice daily.    Dispense:  53 tablet    Refill:  0  . umeclidinium bromide (INCRUSE ELLIPTA) 62.5 MCG/INH AEPB    Sig: Inhale 1 puff into the lungs daily.    Dispense:  1 each    Refill:  6   Return in about 6 months (around 12/02/2017).      Date: 06/01/2017  MRN# [id number] Fernie Grimm Forbush May 25, 1945    Wrigley Plasencia Koenigsberg is a 72 y.o. old male seen in consultation for chief complaint of:    Chief Complaint  Patient presents with  . Asthma    Pt here f/u with PFT: pt is only using Albuterol rescue which is controlling asthma.    HPI:   The patient had been seeing Dr. Pryor Ochoa for sinus issues. His grand daughter is 39 yo and gives his several colds.  He notes that his breathing has been doing ok. He is not using advair or incruse because they ran out and he did not continue them. He is not sure that they were helping.   He has not had any further exacerbations and has overall been feeling well. He is smoking half ppd and is wanting to quit.   He has flonase but does not use it regularly. He does have reflux  symptoms which are controlled with omeprazole every night, if he does not take it his symptoms are worse.   Images personally reviewed, chest x-ray; 07/28/16 ; normal. Lower cuts of lungs on CTAP 12/21/15; **CT chest 02/10/15; normal. Lungs, a few tiny lung nodules, unchanged from 2015.  **PFT 05/29/17; FVC equals 83% predicted, FEV1 equals 55% predicted there is 24% improvement with bronchodilator therapy.  Ratio is 52%.  Flow volume loop is obstructed forced expiratory time is 12 seconds TLC equals 120% predicted, FRC equals 122%, ratio is elevated consistent with air trapping.  DLCO equal 71%. -Overall this test is consistent with moderate obstructive lung disease with reversibility, and air trapping.   **Review of outside ENT records, Dr. Pryor Ochoa; patient was seen for chronic cough, history of chronic maxillary sinusitis. Has been noted to have periodic wheezing, was started empirically on Advair, sinus rinse. Upper respiratory culture. 07/19/16 is positive for heavy growth of both strep pneumonia and Moraxella catarrhalis. This appeared to be some sensitive to the Bactrim that the patient had been on  Social Hx:   Social History   Tobacco Use  . Smoking status: Current Every Day Smoker    Packs/day: 0.50    Years: 35.00    Pack years: 17.50  . Smokeless tobacco:  Never Used  Substance Use Topics  . Alcohol use: No  . Drug use: No   Medication:   Reviewed.    Allergies:  Tramadol; Ivp dye [iodinated diagnostic agents]; and Pregabalin  Review of Systems: Gen:  Denies  fever, sweats, chills HEENT: Denies blurred vision, double vision. bleeds, sore throat Cvc:  No dizziness, chest pain. Resp:   Denies cough or sputum production, shortness of breath Gi: Denies swallowing difficulty, stomach pain. Gu:  Denies bladder incontinence, burning urine Ext:   No Joint pain, stiffness. Skin: No skin rash,  hives  Endoc:  No polyuria, polydipsia. Psych: No depression, insomnia. Other:  All  other systems were reviewed with the patient and were negative other that what is mentioned in the HPI.   Physical Examination:   VS: BP (!) 148/72 (BP Location: Left Arm, Cuff Size: Normal)   Pulse (!) 104   Resp 16   Ht 6' (1.829 m)   Wt 143 lb (64.9 kg)   SpO2 94%   BMI 19.39 kg/m   General Appearance: No distress  Neuro:without focal findings,  speech normal,  HEENT: PERRLA, EOM intact.   Pulmonary: normal breath sounds, No wheezing.  CardiovascularNormal S1,S2.  No m/r/g.   Abdomen: Benign, Soft, non-tender. Renal:  No costovertebral tenderness  GU:  No performed at this time. Endoc: No evident thyromegaly, no signs of acromegaly. Skin:   warm, no rashes, no ecchymosis  Extremities: normal, no cyanosis, clubbing.  Other findings:    LABORATORY PANEL:   CBC No results for input(s): WBC, HGB, HCT, PLT in the last 168 hours. ------------------------------------------------------------------------------------------------------------------  Chemistries  No results for input(s): NA, K, CL, CO2, GLUCOSE, BUN, CREATININE, CALCIUM, MG, AST, ALT, ALKPHOS, BILITOT in the last 168 hours.  Invalid input(s): GFRCGP ------------------------------------------------------------------------------------------------------------------  Cardiac Enzymes No results for input(s): TROPONINI in the last 168 hours. ------------------------------------------------------------  RADIOLOGY:  No results found.     Thank  you for the consultation and for allowing [facility] to assist in the care of your patient. Our recommendations are noted above.  Please contact us if we can be of further service.   Marda Stalker, MD.  Board Certified in Internal Medicine, Pulmonary Medicine, [place], and Sleep Medicine.  [facility] Office Number: [phone]  Patricia Pesa, M.D.  Merton Border, M.D  06/01/2017

## 2017-06-04 ENCOUNTER — Ambulatory Visit (INDEPENDENT_AMBULATORY_CARE_PROVIDER_SITE_OTHER): Payer: BLUE CROSS/BLUE SHIELD | Admitting: Internal Medicine

## 2017-06-04 ENCOUNTER — Encounter: Payer: Self-pay | Admitting: Internal Medicine

## 2017-06-04 VITALS — BP 148/72 | HR 104 | Resp 16 | Ht 72.0 in | Wt 143.0 lb

## 2017-06-04 DIAGNOSIS — J449 Chronic obstructive pulmonary disease, unspecified: Secondary | ICD-10-CM | POA: Diagnosis not present

## 2017-06-04 DIAGNOSIS — F17210 Nicotine dependence, cigarettes, uncomplicated: Secondary | ICD-10-CM | POA: Diagnosis not present

## 2017-06-04 MED ORDER — UMECLIDINIUM BROMIDE 62.5 MCG/INH IN AEPB: 1.0000 | INHALATION_SPRAY | Freq: Every day | RESPIRATORY_TRACT | 6 refills | Status: DC

## 2017-06-04 MED ORDER — VARENICLINE TARTRATE 0.5 MG X 11 & 1 MG X 42 PO MISC: ORAL | 0 refills | Status: DC

## 2017-06-04 NOTE — Patient Instructions (Signed)
Use incruse inhaler one puff once daily, continue albuterol as needed.   --Quitting smoking is the most important thing that you can do for your health.  --Quitting smoking will have greater affect on your health than any medicine that we can give you.    --The best way to quit is to set a quit date, usually a day that has meaning like someone's birthday.  --Start any medication prescribed for quitting one week before you quit date. Then toss out the cigarettes on your quit date.  --If you start smoking again, start from scratch--set another quit day and try again!

## 2017-06-22 DIAGNOSIS — Z0000 Encounter for general adult medical examination without abnormal findings: Secondary | ICD-10-CM | POA: Insufficient documentation

## 2017-09-13 ENCOUNTER — Other Ambulatory Visit: Payer: Self-pay | Admitting: Internal Medicine

## 2017-09-14 ENCOUNTER — Other Ambulatory Visit: Payer: Self-pay | Admitting: Internal Medicine

## 2017-09-14 MED ORDER — TIOTROPIUM BROMIDE MONOHYDRATE 18 MCG IN CAPS: 18.0000 ug | ORAL_CAPSULE | Freq: Every day | RESPIRATORY_TRACT | 5 refills | Status: DC

## 2017-09-14 NOTE — Telephone Encounter (Signed)
Can try switch to spiriva handihaler once daily.

## 2017-09-14 NOTE — Telephone Encounter (Signed)
Incruse changed to Spiriva Resp.

## 2017-11-10 ENCOUNTER — Other Ambulatory Visit: Payer: Self-pay | Admitting: Internal Medicine

## 2017-11-12 ENCOUNTER — Other Ambulatory Visit: Payer: Self-pay | Admitting: Internal Medicine

## 2019-01-03 DIAGNOSIS — N1832 Chronic kidney disease, stage 3b: Secondary | ICD-10-CM | POA: Insufficient documentation

## 2019-01-03 DIAGNOSIS — N183 Chronic kidney disease, stage 3 unspecified: Secondary | ICD-10-CM | POA: Insufficient documentation

## 2019-01-09 ENCOUNTER — Other Ambulatory Visit: Payer: Self-pay | Admitting: Gastroenterology

## 2019-01-09 DIAGNOSIS — R634 Abnormal weight loss: Secondary | ICD-10-CM

## 2019-01-09 DIAGNOSIS — K5909 Other constipation: Secondary | ICD-10-CM

## 2019-01-09 DIAGNOSIS — R194 Change in bowel habit: Secondary | ICD-10-CM

## 2019-01-21 ENCOUNTER — Ambulatory Visit
Admission: RE | Admit: 2019-01-21 | Discharge: 2019-01-21 | Disposition: A | Discharge status: Home / Self Care | Payer: BC Managed Care – PPO | Source: Ambulatory Visit | Attending: Gastroenterology | Admitting: Gastroenterology

## 2019-01-21 ENCOUNTER — Other Ambulatory Visit: Payer: Self-pay

## 2019-01-21 DIAGNOSIS — R634 Abnormal weight loss: Secondary | ICD-10-CM | POA: Diagnosis not present

## 2019-01-21 DIAGNOSIS — K5909 Other constipation: Secondary | ICD-10-CM | POA: Diagnosis present

## 2019-01-21 DIAGNOSIS — R194 Change in bowel habit: Secondary | ICD-10-CM | POA: Diagnosis present

## 2019-03-07 ENCOUNTER — Other Ambulatory Visit: Payer: Self-pay | Admitting: Student

## 2019-03-07 DIAGNOSIS — R630 Anorexia: Secondary | ICD-10-CM

## 2019-03-07 DIAGNOSIS — R634 Abnormal weight loss: Secondary | ICD-10-CM

## 2019-03-07 DIAGNOSIS — R110 Nausea: Secondary | ICD-10-CM

## 2019-03-17 ENCOUNTER — Other Ambulatory Visit: Payer: Self-pay

## 2019-03-17 ENCOUNTER — Encounter
Admission: RE | Admit: 2019-03-17 | Discharge: 2019-03-17 | Disposition: A | Discharge status: Home / Self Care | Payer: BC Managed Care – PPO | Source: Ambulatory Visit | Attending: Student | Admitting: Student

## 2019-03-17 DIAGNOSIS — R630 Anorexia: Secondary | ICD-10-CM | POA: Diagnosis not present

## 2019-03-17 DIAGNOSIS — R110 Nausea: Secondary | ICD-10-CM | POA: Insufficient documentation

## 2019-03-17 DIAGNOSIS — R634 Abnormal weight loss: Secondary | ICD-10-CM

## 2019-03-17 MED ORDER — TECHNETIUM TC 99M SULFUR COLLOID: 2.0000 | Freq: Once | INTRAVENOUS | Status: DC | PRN

## 2019-11-06 ENCOUNTER — Other Ambulatory Visit: Payer: Self-pay | Admitting: Family Medicine

## 2019-11-06 ENCOUNTER — Other Ambulatory Visit (HOSPITAL_COMMUNITY): Payer: Self-pay | Admitting: Family Medicine

## 2019-11-06 DIAGNOSIS — F5101 Primary insomnia: Secondary | ICD-10-CM | POA: Insufficient documentation

## 2019-11-06 DIAGNOSIS — R1032 Left lower quadrant pain: Secondary | ICD-10-CM

## 2019-11-17 ENCOUNTER — Ambulatory Visit
Admission: RE | Admit: 2019-11-17 | Discharge: 2019-11-17 | Disposition: A | Discharge status: Home / Self Care | Payer: BC Managed Care – PPO | Source: Ambulatory Visit | Attending: Family Medicine | Admitting: Family Medicine

## 2019-11-17 ENCOUNTER — Other Ambulatory Visit: Payer: Self-pay

## 2019-11-17 DIAGNOSIS — R1032 Left lower quadrant pain: Secondary | ICD-10-CM | POA: Diagnosis present

## 2020-06-21 ENCOUNTER — Emergency Department: Payer: BC Managed Care – PPO

## 2020-06-21 ENCOUNTER — Inpatient Hospital Stay
Admission: EM | Admit: 2020-06-21 | Discharge: 2020-06-23 | DRG: 309 | Disposition: A | Discharge status: Home / Self Care | Payer: BC Managed Care – PPO | Attending: Internal Medicine | Admitting: Internal Medicine

## 2020-06-21 ENCOUNTER — Encounter: Payer: Self-pay | Admitting: Family Medicine

## 2020-06-21 ENCOUNTER — Inpatient Hospital Stay: Payer: BC Managed Care – PPO

## 2020-06-21 ENCOUNTER — Other Ambulatory Visit: Payer: Self-pay

## 2020-06-21 ENCOUNTER — Inpatient Hospital Stay
Admit: 2020-06-21 | Discharge: 2020-06-21 | Disposition: A | Discharge status: Home / Self Care | Payer: BC Managed Care – PPO | Attending: Internal Medicine | Admitting: Internal Medicine

## 2020-06-21 DIAGNOSIS — I452 Bifascicular block: Secondary | ICD-10-CM | POA: Diagnosis present

## 2020-06-21 DIAGNOSIS — J440 Chronic obstructive pulmonary disease with acute lower respiratory infection: Secondary | ICD-10-CM | POA: Diagnosis not present

## 2020-06-21 DIAGNOSIS — E861 Hypovolemia: Secondary | ICD-10-CM | POA: Diagnosis present

## 2020-06-21 DIAGNOSIS — N400 Enlarged prostate without lower urinary tract symptoms: Secondary | ICD-10-CM | POA: Diagnosis present

## 2020-06-21 DIAGNOSIS — I129 Hypertensive chronic kidney disease with stage 1 through stage 4 chronic kidney disease, or unspecified chronic kidney disease: Secondary | ICD-10-CM | POA: Diagnosis present

## 2020-06-21 DIAGNOSIS — K219 Gastro-esophageal reflux disease without esophagitis: Secondary | ICD-10-CM | POA: Diagnosis present

## 2020-06-21 DIAGNOSIS — G8929 Other chronic pain: Secondary | ICD-10-CM | POA: Diagnosis present

## 2020-06-21 DIAGNOSIS — J432 Centrilobular emphysema: Secondary | ICD-10-CM | POA: Diagnosis present

## 2020-06-21 DIAGNOSIS — Z888 Allergy status to other drugs, medicaments and biological substances status: Secondary | ICD-10-CM | POA: Diagnosis not present

## 2020-06-21 DIAGNOSIS — N1831 Chronic kidney disease, stage 3a: Secondary | ICD-10-CM | POA: Diagnosis present

## 2020-06-21 DIAGNOSIS — J209 Acute bronchitis, unspecified: Secondary | ICD-10-CM | POA: Diagnosis present

## 2020-06-21 DIAGNOSIS — N179 Acute kidney failure, unspecified: Secondary | ICD-10-CM | POA: Diagnosis present

## 2020-06-21 DIAGNOSIS — Z79899 Other long term (current) drug therapy: Secondary | ICD-10-CM

## 2020-06-21 DIAGNOSIS — I4891 Unspecified atrial fibrillation: Secondary | ICD-10-CM | POA: Diagnosis present

## 2020-06-21 DIAGNOSIS — I480 Paroxysmal atrial fibrillation: Secondary | ICD-10-CM | POA: Diagnosis present

## 2020-06-21 DIAGNOSIS — Z885 Allergy status to narcotic agent status: Secondary | ICD-10-CM

## 2020-06-21 DIAGNOSIS — J019 Acute sinusitis, unspecified: Secondary | ICD-10-CM | POA: Diagnosis present

## 2020-06-21 DIAGNOSIS — Z7952 Long term (current) use of systemic steroids: Secondary | ICD-10-CM | POA: Diagnosis not present

## 2020-06-21 DIAGNOSIS — K589 Irritable bowel syndrome without diarrhea: Secondary | ICD-10-CM | POA: Diagnosis present

## 2020-06-21 DIAGNOSIS — F17210 Nicotine dependence, cigarettes, uncomplicated: Secondary | ICD-10-CM | POA: Diagnosis present

## 2020-06-21 DIAGNOSIS — Z20822 Contact with and (suspected) exposure to covid-19: Secondary | ICD-10-CM | POA: Diagnosis present

## 2020-06-21 DIAGNOSIS — R7989 Other specified abnormal findings of blood chemistry: Secondary | ICD-10-CM | POA: Diagnosis present

## 2020-06-21 LAB — CBC
HCT: 38.3 % — ABNORMAL LOW (ref 39.0–52.0)
Hemoglobin: 12.6 g/dL — ABNORMAL LOW (ref 13.0–17.0)
MCH: 33.1 pg (ref 26.0–34.0)
MCHC: 32.9 g/dL (ref 30.0–36.0)
MCV: 100.5 fL — ABNORMAL HIGH (ref 80.0–100.0)
Platelets: 158 10*3/uL (ref 150–400)
RBC: 3.81 MIL/uL — ABNORMAL LOW (ref 4.22–5.81)
RDW: 12.8 % (ref 11.5–15.5)
WBC: 7.7 10*3/uL (ref 4.0–10.5)
nRBC: 0 % (ref 0.0–0.2)

## 2020-06-21 LAB — CBC WITH DIFFERENTIAL/PLATELET
Abs Immature Granulocytes: 0.03 10*3/uL (ref 0.00–0.07)
Basophils Absolute: 0 10*3/uL (ref 0.0–0.1)
Basophils Relative: 0 %
Eosinophils Absolute: 0 10*3/uL (ref 0.0–0.5)
Eosinophils Relative: 0 %
HCT: 42.3 % (ref 39.0–52.0)
Hemoglobin: 13.6 g/dL (ref 13.0–17.0)
Immature Granulocytes: 0 %
Lymphocytes Relative: 8 %
Lymphs Abs: 0.6 10*3/uL — ABNORMAL LOW (ref 0.7–4.0)
MCH: 32.7 pg (ref 26.0–34.0)
MCHC: 32.2 g/dL (ref 30.0–36.0)
MCV: 101.7 fL — ABNORMAL HIGH (ref 80.0–100.0)
Monocytes Absolute: 0.6 10*3/uL (ref 0.1–1.0)
Monocytes Relative: 8 %
Neutro Abs: 6.3 10*3/uL (ref 1.7–7.7)
Neutrophils Relative %: 84 %
Platelets: 179 10*3/uL (ref 150–400)
RBC: 4.16 MIL/uL — ABNORMAL LOW (ref 4.22–5.81)
RDW: 12.6 % (ref 11.5–15.5)
WBC: 7.6 10*3/uL (ref 4.0–10.5)
nRBC: 0 % (ref 0.0–0.2)

## 2020-06-21 LAB — COMPREHENSIVE METABOLIC PANEL
ALT: 15 U/L (ref 0–44)
AST: 29 U/L (ref 15–41)
Albumin: 4.2 g/dL (ref 3.5–5.0)
Alkaline Phosphatase: 36 U/L — ABNORMAL LOW (ref 38–126)
Anion gap: 14 (ref 5–15)
BUN: 37 mg/dL — ABNORMAL HIGH (ref 8–23)
CO2: 21 mmol/L — ABNORMAL LOW (ref 22–32)
Calcium: 9 mg/dL (ref 8.9–10.3)
Chloride: 102 mmol/L (ref 98–111)
Creatinine, Ser: 1.75 mg/dL — ABNORMAL HIGH (ref 0.61–1.24)
GFR, Estimated: 40 mL/min — ABNORMAL LOW (ref >60)
Glucose, Bld: 144 mg/dL — ABNORMAL HIGH (ref 70–99)
Potassium: 4.3 mmol/L (ref 3.5–5.1)
Sodium: 137 mmol/L (ref 135–145)
Total Bilirubin: 0.6 mg/dL (ref 0.3–1.2)
Total Protein: 7.3 g/dL (ref 6.5–8.1)

## 2020-06-21 LAB — BRAIN NATRIURETIC PEPTIDE: B Natriuretic Peptide: 152.9 pg/mL — ABNORMAL HIGH (ref 0.0–100.0)

## 2020-06-21 LAB — MAGNESIUM: Magnesium: 2 mg/dL (ref 1.7–2.4)

## 2020-06-21 LAB — URINALYSIS, COMPLETE (UACMP) WITH MICROSCOPIC
Bacteria, UA: NONE SEEN
Bilirubin Urine: NEGATIVE
Glucose, UA: NEGATIVE mg/dL
Ketones, ur: NEGATIVE mg/dL
Leukocytes,Ua: NEGATIVE
Nitrite: NEGATIVE
Protein, ur: NEGATIVE mg/dL
Specific Gravity, Urine: 1.012 (ref 1.005–1.030)
Squamous Epithelial / HPF: NONE SEEN (ref 0–5)
WBC, UA: NONE SEEN WBC/hpf (ref 0–5)
pH: 5 (ref 5.0–8.0)

## 2020-06-21 LAB — ECHOCARDIOGRAM COMPLETE
Height: 72 in
S' Lateral: 3 cm
Weight: 2320 oz

## 2020-06-21 LAB — RESP PANEL BY RT-PCR (FLU A&B, COVID) ARPGX2
Influenza A by PCR: NEGATIVE
Influenza B by PCR: NEGATIVE
SARS Coronavirus 2 by RT PCR: NEGATIVE

## 2020-06-21 LAB — TROPONIN I (HIGH SENSITIVITY)
Troponin I (High Sensitivity): 6 ng/L (ref <18)
Troponin I (High Sensitivity): 8 ng/L (ref <18)

## 2020-06-21 LAB — FIBRIN DERIVATIVES D-DIMER (ARMC ONLY): Fibrin derivatives D-dimer (ARMC): 1263.87 ng/mL (FEU) — ABNORMAL HIGH (ref 0.00–499.00)

## 2020-06-21 LAB — T4, FREE: Free T4: 0.78 ng/dL (ref 0.61–1.12)

## 2020-06-21 LAB — TSH: TSH: 1.334 u[IU]/mL (ref 0.350–4.500)

## 2020-06-21 MED ORDER — FLUTICASONE PROPIONATE 50 MCG/ACT NA SUSP
1.0000 | Freq: Every day | NASAL | Status: DC
  Administered 2020-06-21 – 2020-06-23 (×3): 1 via NASAL
  Filled 2020-06-21: qty 16

## 2020-06-21 MED ORDER — IOHEXOL 350 MG/ML SOLN
60.0000 mL | Freq: Once | INTRAVENOUS | Status: AC | PRN
  Administered 2020-06-21: 60 mL via INTRAVENOUS

## 2020-06-21 MED ORDER — OXYCODONE HCL 5 MG PO TABS
5.0000 mg | ORAL_TABLET | ORAL | Status: DC | PRN
Start: 2020-06-21 — End: 2020-06-23

## 2020-06-21 MED ORDER — PANTOPRAZOLE SODIUM 20 MG PO TBEC
20.0000 mg | DELAYED_RELEASE_TABLET | Freq: Every day | ORAL | Status: DC
Start: 2020-06-21 — End: 2020-06-23
  Administered 2020-06-21 – 2020-06-23 (×3): 20 mg via ORAL
  Filled 2020-06-21 (×3): qty 1

## 2020-06-21 MED ORDER — APIXABAN 5 MG PO TABS
5.0000 mg | ORAL_TABLET | Freq: Two times a day (BID) | ORAL | Status: DC
  Administered 2020-06-21 – 2020-06-23 (×5): 5 mg via ORAL
  Filled 2020-06-21 (×5): qty 1

## 2020-06-21 MED ORDER — DIPHENHYDRAMINE HCL 25 MG PO CAPS
50.0000 mg | ORAL_CAPSULE | Freq: Once | ORAL | Status: AC
  Administered 2020-06-21: 50 mg via ORAL
  Filled 2020-06-21: qty 2

## 2020-06-21 MED ORDER — DILTIAZEM HCL 60 MG PO TABS
30.0000 mg | ORAL_TABLET | Freq: Four times a day (QID) | ORAL | Status: DC
  Administered 2020-06-21: 30 mg via ORAL
  Filled 2020-06-21: qty 1

## 2020-06-21 MED ORDER — AMOXICILLIN-POT CLAVULANATE 875-125 MG PO TABS
1.0000 | ORAL_TABLET | Freq: Two times a day (BID) | ORAL | Status: DC
  Administered 2020-06-21 – 2020-06-22 (×3): 1 via ORAL
  Filled 2020-06-21 (×3): qty 1

## 2020-06-21 MED ORDER — ALBUTEROL SULFATE HFA 108 (90 BASE) MCG/ACT IN AERS
2.0000 | INHALATION_SPRAY | Freq: Four times a day (QID) | RESPIRATORY_TRACT | Status: DC | PRN
  Filled 2020-06-21 (×2): qty 6.7

## 2020-06-21 MED ORDER — PREDNISONE 10 MG (21) PO TBPK: ORAL_TABLET | ORAL | Status: DC

## 2020-06-21 MED ORDER — TAMSULOSIN HCL 0.4 MG PO CAPS
0.4000 mg | ORAL_CAPSULE | Freq: Every day | ORAL | Status: DC
  Administered 2020-06-21 – 2020-06-23 (×3): 0.4 mg via ORAL
  Filled 2020-06-21 (×3): qty 1

## 2020-06-21 MED ORDER — DILTIAZEM HCL 25 MG/5ML IV SOLN
10.0000 mg | Freq: Once | INTRAVENOUS | Status: AC
  Administered 2020-06-21: 10 mg via INTRAVENOUS
  Filled 2020-06-21: qty 5

## 2020-06-21 MED ORDER — HYDROCORTISONE NA SUCCINATE PF 250 MG IJ SOLR
200.0000 mg | Freq: Once | INTRAMUSCULAR | Status: AC
  Administered 2020-06-21: 200 mg via INTRAVENOUS
  Filled 2020-06-21: qty 200

## 2020-06-21 MED ORDER — ONDANSETRON HCL 4 MG/2ML IJ SOLN: 4.0000 mg | Freq: Four times a day (QID) | INTRAMUSCULAR | Status: DC | PRN

## 2020-06-21 MED ORDER — GUAIFENESIN ER 600 MG PO TB12
600.0000 mg | ORAL_TABLET | Freq: Two times a day (BID) | ORAL | Status: DC
  Administered 2020-06-21 – 2020-06-23 (×5): 600 mg via ORAL
  Filled 2020-06-21 (×5): qty 1

## 2020-06-21 MED ORDER — SODIUM CHLORIDE 0.9 % IV BOLUS
500.0000 mL | Freq: Once | INTRAVENOUS | Status: AC
  Administered 2020-06-21: 500 mL via INTRAVENOUS

## 2020-06-21 MED ORDER — OXYCODONE HCL ER 15 MG PO T12A
15.0000 mg | EXTENDED_RELEASE_TABLET | Freq: Three times a day (TID) | ORAL | Status: DC
  Administered 2020-06-21 – 2020-06-23 (×7): 15 mg via ORAL
  Filled 2020-06-21 (×7): qty 1

## 2020-06-21 MED ORDER — LINACLOTIDE 145 MCG PO CAPS
145.0000 ug | ORAL_CAPSULE | Freq: Every day | ORAL | Status: DC
  Administered 2020-06-21 – 2020-06-23 (×3): 145 ug via ORAL
  Filled 2020-06-21 (×4): qty 1

## 2020-06-21 MED ORDER — ACETAMINOPHEN 325 MG PO TABS: 650.0000 mg | ORAL_TABLET | ORAL | Status: DC | PRN

## 2020-06-21 MED ORDER — OXYCODONE-ACETAMINOPHEN 10-325 MG PO TABS: 1.0000 | ORAL_TABLET | ORAL | Status: DC | PRN

## 2020-06-21 MED ORDER — ONDANSETRON HCL 4 MG/2ML IJ SOLN: 4.0000 mg | Freq: Once | INTRAMUSCULAR | Status: DC | PRN

## 2020-06-21 MED ORDER — DILTIAZEM HCL 25 MG/5ML IV SOLN
15.0000 mg | Freq: Once | INTRAVENOUS | Status: AC
  Administered 2020-06-21: 15 mg via INTRAVENOUS
  Filled 2020-06-21: qty 5

## 2020-06-21 MED ORDER — ZOLPIDEM TARTRATE 5 MG PO TABS: 5.0000 mg | ORAL_TABLET | Freq: Every evening | ORAL | Status: DC | PRN

## 2020-06-21 MED ORDER — ALPRAZOLAM 0.25 MG PO TABS: 0.2500 mg | ORAL_TABLET | Freq: Two times a day (BID) | ORAL | Status: DC | PRN

## 2020-06-21 MED ORDER — ENOXAPARIN SODIUM 40 MG/0.4ML ~~LOC~~ SOLN
40.0000 mg | SUBCUTANEOUS | Status: DC
  Filled 2020-06-21: qty 0.4

## 2020-06-21 MED ORDER — ASPIRIN EC 325 MG PO TBEC
325.0000 mg | DELAYED_RELEASE_TABLET | Freq: Every day | ORAL | Status: DC
  Administered 2020-06-21: 325 mg via ORAL
  Filled 2020-06-21: qty 1

## 2020-06-21 MED ORDER — DILTIAZEM HCL 60 MG PO TABS
30.0000 mg | ORAL_TABLET | Freq: Once | ORAL | Status: AC
  Administered 2020-06-21: 30 mg via ORAL
  Filled 2020-06-21: qty 1

## 2020-06-21 MED ORDER — DILTIAZEM HCL ER COATED BEADS 180 MG PO CP24
180.0000 mg | ORAL_CAPSULE | Freq: Every day | ORAL | Status: DC
  Administered 2020-06-21 – 2020-06-23 (×3): 180 mg via ORAL
  Filled 2020-06-21 (×4): qty 1

## 2020-06-21 MED ORDER — OXYCODONE-ACETAMINOPHEN 5-325 MG PO TABS: 1.0000 | ORAL_TABLET | ORAL | Status: DC | PRN

## 2020-06-21 MED ORDER — LISINOPRIL 5 MG PO TABS
2.5000 mg | ORAL_TABLET | Freq: Every day | ORAL | Status: DC
  Administered 2020-06-21 – 2020-06-23 (×3): 2.5 mg via ORAL
  Filled 2020-06-21 (×3): qty 1

## 2020-06-21 NOTE — ED Provider Notes (Signed)
[facility] Emergency Department Provider Note  ____________________________________________   Event Date/Time   First MD Initiated Contact with Patient 06/21/20 [phone]     (approximate)  I have reviewed the triage vital signs and the nursing notes.   HISTORY  Chief Complaint Shortness of Breath    HPI Riley Martin is a 75 y.o. male with COPD who comes in with shortness of breath.  Patient states has not been feeling well for 3 weeks.  Had a recent COVID test and was put on antibiotics and steroids.  Patient called EMS due to worsening shortness of breath.  States has been getting worse over the past 3 days, constant, nothing makes it better including the steroids antibiotics, worse with ambulation.  Patient was noted to be in A. fib with RVR with EMS.  Patient states that he might of had a history of A. fib remotely but is not on any medications for it.  Denies any abdominal pain, leg swelling.  Patient does not have his COVID vaccines.  Patient's initial oxygen level was 91% with EMS.          Past Medical History:  Diagnosis Date  . Arthritis   . COPD (chronic obstructive pulmonary disease) ([place])   . GERD (gastroesophageal reflux disease)   . Headache   . Orthostatic dizziness   . PONV (postoperative nausea and vomiting)   . Torn rotator cuff    left    Patient Active Problem List   Diagnosis Date Noted  . BPH (benign prostatic hyperplasia) 09/13/2016  . Spinal stenosis 09/13/2016  . Tobacco dependence 09/13/2016  . History of normocytic normochromic anemia 06/21/2016  . Vaccine counseling 06/21/2016  . Essential hypertriglyceridemia 04/02/2015  . History of adenomatous polyp of colon 02/05/2015  . History of gastroesophageal reflux (GERD) 02/05/2015  . History of insomnia 02/05/2015  . Irritable bowel syndrome with constipation 02/05/2015    Past Surgical History:  Procedure Laterality Date  . COLONOSCOPY    . EXCISION CHONCHA  BULLOSA Bilateral 06/02/2015   Procedure: BILATERAL CHONCHA BULLOSA;  Surgeon: Carloyn Manner, MD;  Location: [facility];  Service: ENT;  Laterality: Bilateral;  . MAXILLARY ANTROSTOMY Bilateral 06/02/2015   Procedure: MAXILLARY ANTROSTOMY;  Surgeon: Carloyn Manner, MD;  Location: [facility];  Service: ENT;  Laterality: Bilateral;  . NASAL POLYP SURGERY    . NECK SURGERY  2008   no limitations    Prior to Admission medications   Medication Sig Start Date End Date Taking? Authorizing Provider  dicyclomine (BENTYL) 10 MG capsule Take 1 capsule (10 mg total) by mouth 3 (three) times daily as needed for spasms. 12/24/15 01/07/16  Drenda Freeze, MD  fluticasone [facility]) 50 MCG/ACT nasal spray SPRAY 2 SPRAYS INTO EACH NOSTRIL EVERY DAY 02/07/17   Laverle Hobby, MD  INCRUSE ELLIPTA 62.5 MCG/INH AEPB Please specify directions, refills and quantity 11/12/17   Laverle Hobby, MD  omeprazole (PRILOSEC) 40 MG capsule Take 40 mg by mouth 2 (two) times daily.    [provider]  oxyCODONE-acetaminophen (ROXICET) 5-325 MG tablet Take 1-2 tablets by mouth every 4 (four) hours as needed for severe pain. 06/02/15   Carloyn Manner, MD  SPIRIVA RESPIMAT 2.5 MCG/ACT AERS Please specify directions, refills and quantity 09/14/17   Laverle Hobby, MD  tamsulosin (FLOMAX) 0.4 MG CAPS capsule Take 0.4 mg by mouth daily. 09/03/16   [provider]  varenicline (CHANTIX STARTING MONTH PAK) 0.5 MG X 11 & 1 MG X  42 tablet Take one 0.5 mg tablet by mouth once daily for 3 days, then increase to one 0.5 mg tablet twice daily for 4 days, then increase to one 1 mg tablet twice daily. 06/04/17   Laverle Hobby, MD  VENTOLIN HFA 108 (90 Base) MCG/ACT inhaler Inhale 2 puffs into the lungs every 6 (six) hours as needed. 08/07/16   [provider]    Allergies Tramadol, Ivp dye [iodinated diagnostic agents], and Pregabalin  No family history on  file.  Social History Social History   Tobacco Use  . Smoking status: Current Every Day Smoker    Packs/day: 0.50    Years: 35.00    Pack years: 17.50    Types: Cigarettes  . Smokeless tobacco: Never Used  Vaping Use  . Vaping Use: Never used  Substance Use Topics  . Alcohol use: No  . Drug use: No      Review of Systems Constitutional: No fever/chills Eyes: No visual changes. ENT: No sore throat. Cardiovascular: Positive chest pain Respiratory: Positive for SOB, cough, congestion Gastrointestinal: No abdominal pain.  No nausea, no vomiting.  No diarrhea.  No constipation. Genitourinary: Negative for dysuria. Musculoskeletal: Negative for back pain. Skin: Negative for rash. Neurological: Negative for headaches, focal weakness or numbness. All other ROS negative ____________________________________________   PHYSICAL EXAM:  VITAL SIGNS: Blood pressure 126/65, pulse (!) 116, temperature 98.5 F (36.9 C), temperature source Oral, resp. rate (!) 25, height 6' (1.829 m), weight 65.8 kg, SpO2 96 %.   Constitutional: Alert and oriented. Well appearing with mild distress Eyes: Conjunctivae are normal. EOMI. Head: Atraumatic. Nose: No congestion/rhinnorhea. Mouth/Throat: Mucous membranes are moist.   Neck: No stridor. Trachea Midline. FROM Cardiovascular: Irregular and fast rhythm grossly normal heart sounds.  Good peripheral circulation. Respiratory: Increased work of breathing, frequently coughing Gastrointestinal: Soft and nontender. No distention. No abdominal bruits.  Musculoskeletal: No lower extremity tenderness nor edema.  No joint effusions. Neurologic:  Normal speech and language. No gross focal neurologic deficits are appreciated.  Skin:  Skin is warm, dry and intact. No rash noted. Psychiatric: Mood and affect are normal. Speech and behavior are normal. GU: Deferred   ____________________________________________   LABS (all labs ordered are listed, but  only abnormal results are displayed)  Labs Reviewed  CBC WITH DIFFERENTIAL/PLATELET - Abnormal; Notable for the following components:      Result Value   RBC 4.16 (*)    MCV 101.7 (*)    Lymphs Abs 0.6 (*)    All other components within normal limits  COMPREHENSIVE METABOLIC PANEL - Abnormal; Notable for the following components:   CO2 21 (*)    Glucose, Bld 144 (*)    BUN 37 (*)    Creatinine, Ser 1.75 (*)    Alkaline Phosphatase 36 (*)    GFR, Estimated 40 (*)    All other components within normal limits  BRAIN NATRIURETIC PEPTIDE - Abnormal; Notable for the following components:   B Natriuretic Peptide 152.9 (*)    All other components within normal limits  URINALYSIS, COMPLETE (UACMP) WITH MICROSCOPIC - Abnormal; Notable for the following components:   Color, Urine STRAW (*)    APPearance CLEAR (*)    Hgb urine dipstick SMALL (*)    All other components within normal limits  FIBRIN DERIVATIVES D-DIMER ([facility] ONLY) - Abnormal; Notable for the following components:   Fibrin derivatives D-dimer [facility]) 9,562.13 (*)    All other components within normal limits  CBC - Abnormal; Notable  for the following components:   RBC 3.81 (*)    Hemoglobin 12.6 (*)    HCT 38.3 (*)    MCV 100.5 (*)    All other components within normal limits  RESP PANEL BY RT-PCR (FLU A&B, COVID) ARPGX2  MAGNESIUM  TSH  T4, FREE  BASIC METABOLIC PANEL  CBC  TROPONIN I (HIGH SENSITIVITY)  TROPONIN I (HIGH SENSITIVITY)   ____________________________________________   ED ECG REPORT I, Vanessa [place], the attending physician, personally viewed and interpreted this ECG.  A. fib with RVR with widened QRS secondary to right bundle branch block, no ST elevation, T wave inversion lead III, occasional PVC  Atrial fibrillation rate of 137, no ST elevation, T wave inversion in lead V3, right bundle branch block and left posterior fascicular block ____________________________________________  RADIOLOGY I,  Vanessa [place], personally viewed and evaluated these images (plain radiographs) as part of my medical decision making, as well as reviewing the written report by the radiologist.  ED MD interpretation: No pneumonia  Official radiology report(s): DG Chest Portable 1 View  Result Date: 06/21/2020 CLINICAL DATA:  Shortness of breath. COVID negative. With RVR. Upon arrival patient is in supraventricular tachycardia. EXAM: PORTABLE CHEST 1 VIEW COMPARISON:  Chest x-ray 07/28/2016 FINDINGS: The heart size and mediastinal contours are unchanged. Aortic arch calcifications. No focal consolidation. No pulmonary edema. No pleural effusion. No pneumothorax. No acute osseous abnormality. IMPRESSION: No active disease. Electronically Signed   By: Iven Finn M.D.   On: 06/21/2020 02:13    ____________________________________________   PROCEDURES  Procedure(s) performed (including Critical Care):  .Critical Care Performed by: Vanessa [place], MD Authorized by: Vanessa [place], MD   Critical care provider statement:    Critical care time (minutes):  45   Critical care was necessary to treat or prevent imminent or life-threatening deterioration of the following conditions:  Cardiac failure   Critical care was time spent personally by me on the following activities:  Discussions with consultants, evaluation of patient's response to treatment, examination of patient, ordering and performing treatments and interventions, ordering and review of laboratory studies, ordering and review of radiographic studies, pulse oximetry, re-evaluation of patient's condition, obtaining history from patient or surrogate and review of old charts  .1-3 Lead EKG Interpretation Performed by: Vanessa [place], MD Authorized by: Vanessa [place], MD     Interpretation: abnormal     ECG rate:  Patient alternates between normal sinus, sinus tachycardia and A. fib with RVR   Ectopy: none     Conduction: normal        ____________________________________________   INITIAL IMPRESSION / ASSESSMENT AND PLAN / ED COURSE   Riley Martin was evaluated in Emergency Department on 06/21/2020 for the symptoms described in the history of present illness. He was evaluated in the context of the global COVID-19 pandemic, which necessitated consideration that the patient might be at risk for infection with the SARS-CoV-2 virus that causes COVID-19. Institutional protocols and algorithms that pertain to the evaluation of patients at risk for COVID-19 are in a state of rapid change based on information released by regulatory bodies including the CDC and federal and state organizations. These policies and algorithms were followed during the patient's care in the ED.     Pt presents with SOB.  Most likely secondary to arrhythmia but does not explain why he has been feeling ill for the past 3 weeks given when he was at his prior doctor's appointment he had  an EKG done that was normal sinus.   Differential includes: PNA-will get xray to evaluation Anemia-CBC to evaluate ACS- will get trops Arrhythmia-Will get EKG and keep on monitor.  COVID- will get testing per algorithm. PE-given shortness of breath for 3 weeks will get D-dimer  2:06 AM Patient was given 10 mg of diltiazem.  Initially slowed him down and went to reevaluate him 10 minutes later and he converted to normal sinus at that time.  Patient went back into atrial fibrillation have to be given another 15 mg of diltiazem and some oral dealt sure to keep them down.  Heart rates are now in the 100s.   Given continued SOB for prolong period I think pt needs ddimer to evalute for PE.  Ddimer elevated. Allergy to contrast so will prep. NO h/o anaphylaxis. Pt agreeable to trying prep.     Patient is a 75 year old with new onset atrial fibrillation will discuss with hospital team for admission for cardiac monitoring and further  work-up.   ____________________________________________   FINAL CLINICAL IMPRESSION(S) / ED DIAGNOSES   Final diagnoses:  Atrial fibrillation with rapid ventricular response ([place])     MEDICATIONS GIVEN DURING THIS VISIT:  Medications  diltiazem (CARDIZEM) injection 10 mg (10 mg Intravenous Given 06/21/20 0155)  diltiazem (CARDIZEM) tablet 30 mg (30 mg Oral Given 06/21/20 0226)  diltiazem (CARDIZEM) injection 15 mg (15 mg Intravenous Given 06/21/20 0244)  sodium chloride 0.9 % bolus 500 mL (0 mLs Intravenous Stopped 06/21/20 0357)     ED Discharge Orders    None       Note:  This document was prepared using Dragon voice recognition software and may include unintentional dictation errors.   Vanessa [place], MD 06/21/20 [phone]

## 2020-06-21 NOTE — Progress Notes (Signed)
Brief hospitalist update note.  This is a nonbillable note.  Please see same-day H&P from Dr. Sidney Ace for full billable details.  Briefly, this is a 75 year old male presented to the emergency department was found to be in atrial fibrillation with rapid ventricular response.  No previous endorse history of arrhythmia.  Patient presented with worsening dyspnea.  On admission the patient initially required Cardizem gtt. however has been transitioned to long-acting p.o. Cardizem per the cardiology service.  [facility] clinic cardiology on consult.  Echocardiogram ordered and pending.  CTA thorax negative for pulmonary embolism.  Cardiology discussed with patient and agree to start on anticoagulation.  Eliquis 5 twice daily started.  Further management pending initial clinical course.  Ralene Muskrat MD

## 2020-06-21 NOTE — Discharge Instructions (Addendum)
Atrial Fibrillation  Atrial fibrillation is a type of heartbeat that is irregular or fast. If you have this condition, your heart beats without any order. This makes it hard for your heart to pump blood in a normal way. Atrial fibrillation may come and go, or it may become a long-lasting problem. If this condition is not treated, it can put you at higher risk for stroke, heart failure, and other heart problems. What are the causes? This condition may be caused by diseases that damage the heart. They include:  High blood pressure.  Heart failure.  Heart valve disease.  Heart surgery. Other causes include:  Diabetes.  Thyroid disease.  Being overweight.  Kidney disease. Sometimes the cause is not known. What increases the risk? You are more likely to develop this condition if:  You are older.  You smoke.  You exercise often and very hard.  You have a family history of this condition.  You are a man.  You use drugs.  You drink a lot of alcohol.  You have lung conditions, such as emphysema, pneumonia, or COPD.  You have sleep apnea. What are the signs or symptoms? Common symptoms of this condition include:  A feeling that your heart is beating very fast.  Chest pain or discomfort.  Feeling short of breath.  Suddenly feeling light-headed or weak.  Getting tired easily during activity.  Fainting.  Sweating. In some cases, there are no symptoms. How is this treated? Treatment for this condition depends on underlying conditions and how you feel when you have atrial fibrillation. They include:  Medicines to: ? Prevent blood clots. ? Treat heart rate or heart rhythm problems.  Using devices, such as a pacemaker, to correct heart rhythm problems.  Doing surgery to remove the part of the heart that sends bad signals.  Closing an area where clots can form in the heart (left atrial appendage). In some cases, your doctor will treat other underlying  conditions. Follow these instructions at home: Medicines  Take over-the-counter and prescription medicines only as told by your doctor.  Do not take any new medicines without first talking to your doctor.  If you are taking blood thinners: ? Talk with your doctor before you take any medicines that have aspirin or NSAIDs, such as ibuprofen, in them. ? Take your medicine exactly as told by your doctor. Take it at the same time each day. ? Avoid activities that could hurt or bruise you. Follow instructions about how to prevent falls. ? Wear a bracelet that says you are taking blood thinners. Or, carry a card that lists what medicines you take. Lifestyle  Do not use any products that have nicotine or tobacco in them. These include cigarettes, e-cigarettes, and chewing tobacco. If you need help quitting, ask your doctor.  Eat heart-healthy foods. Talk with your doctor about the right eating plan for you.  Exercise regularly as told by your doctor.  Do not drink alcohol.  Lose weight if you are overweight.  Do not use drugs, including cannabis.      General instructions  If you have a condition that causes breathing to stop for a short period of time (apnea), treat it as told by your doctor.  Keep a healthy weight. Do not use diet pills unless your doctor says they are safe for you. Diet pills may make heart problems worse.  Keep all follow-up visits as told by your doctor. This is important. Contact a doctor if:  You notice  a change in the speed, rhythm, or strength of your heartbeat.  You are taking a blood-thinning medicine and you get more bruising.  You get tired more easily when you move or exercise.  You have a sudden change in weight. Get help right away if:  You have pain in your chest or your belly (abdomen).  You have trouble breathing.  You have side effects of blood thinners, such as blood in your vomit, poop (stool), or pee (urine), or bleeding that cannot  stop.  You have any signs of a stroke. "BE FAST" is an easy way to remember the main warning signs: ? B - Balance. Signs are dizziness, sudden trouble walking, or loss of balance. ? E - Eyes. Signs are trouble seeing or a change in how you see. ? F - Face. Signs are sudden weakness or loss of feeling in the face, or the face or eyelid drooping on one side. ? A - Arms. Signs are weakness or loss of feeling in an arm. This happens suddenly and usually on one side of the body. ? S - Speech. Signs are sudden trouble speaking, slurred speech, or trouble understanding what people say. ? T - Time. Time to call emergency services. Write down what time symptoms started.  You have other signs of a stroke, such as: ? A sudden, very bad headache with no known cause. ? Feeling like you may vomit (nausea). ? Vomiting. ? A seizure. These symptoms may be an emergency. Do not wait to see if the symptoms will go away. Get medical help right away. Call your local emergency services (911 in the [place] Do not drive yourself to the hospital.   Summary  Atrial fibrillation is a type of heartbeat that is irregular or fast.  You are at higher risk of this condition if you smoke, are older, have diabetes, or are overweight.  Follow your doctor's instructions about medicines, diet, exercise, and follow-up visits.  Get help right away if you have signs or symptoms of a stroke.  Get help right away if you cannot catch your breath, or you have chest pain or discomfort. This information is not intended to replace advice given to you by your health care provider. Make sure you discuss any questions you have with your health care provider. Document Revised: 10/16/2018 Document Reviewed: 10/16/2018 Elsevier Patient Education  [place] on my medicine - ELIQUIS (apixaban) This medication education was reviewed with me or my healthcare representative as part of my discharge preparation. The  pharmacist that spoke with me during my hospital stay was: ____________________________ (pharmacist name) WHY WAS [place]? Eliquis was prescribed for you to reduce the risk of a blood clot forming that can cause a stroke if you have a medical condition called atrial fibrillation (a type of irregular heartbeat). WHAT DO YOU NEED TO KNOW ABOUT ELIQUIS ? Take your Eliquis TWICE DAILY - one tablet in the morning and one tablet in the evening with or without food. If you have difficulty swallowing the tablet whole please discuss with your pharmacist how to take the medication safely. Take Eliquis exactly as prescribed by your doctor and DO NOT stop taking Eliquis without talking to the doctor who prescribed the medication. Stopping may increase your risk of developing a stroke. Refill your prescription before you run out. After discharge, you should have regular check-up appointments with your healthcare provider that is prescribing your Eliquis. In the future your dose  may need to be changed if your kidney function or weight changes by a significant amount or as you get older. WHAT DO YOU DO IF YOU MISS A DOSE? If you miss a dose, take it as soon as you remember on the same day and resume taking twice daily. Do not take more than one dose of ELIQUIS at the same time to make up a missed dose. IMPORTANT SAFETY INFORMATION A possible side effect of Eliquis is bleeding. You should call your healthcare provider right away if you experience any of the following: ? Bleeding from an injury or your nose that does not stop. ? Unusual colored urine (red or dark brown) or unusual colored stools (red or black). ? Unusual bruising for unknown reasons. ? A serious fall or if you hit your head (even if there is no bleeding). Some medicines may interact with Eliquis and might increase your risk of bleeding or clotting while on Eliquis. To help avoid this, consult your healthcare  provider or pharmacist prior to using any new prescription or non-prescription medications, including herbals, vitamins, non-steroidal anti-inflammatory drugs (NSAIDs) and supplements. This website has more information on Eliquis (apixaban): [email or web address].no.

## 2020-06-21 NOTE — ED Notes (Signed)
Ambulatory to bathroom no problem

## 2020-06-21 NOTE — ED Triage Notes (Signed)
Pt arrives EMS for c/o of shortness of breath. Pt states he has been sick for 3 weeks and was put on an antibiotic but was COVID neg. EMs reported pt in a-fib with RVR. Upon arrival pt is in SVT with HR 160s-170s.

## 2020-06-21 NOTE — Progress Notes (Signed)
*  PRELIMINARY RESULTS* Echocardiogram 2D Echocardiogram has been performed.  Wallie Char Oden [place] 06/21/2020, 12:43 PM

## 2020-06-21 NOTE — Consult Note (Signed)
CARDIOLOGY CONSULT NOTE               Patient ID: Riley Martin MRN: [id number] DOB/AGE: 77/13/1947 77 y.o.  Admit date: 06/21/2020 Referring Physician Priscella Mann Primary Physician [place] Primary Cardiologist none per patient Reason for Consultation atrial fibrillation with RVR  HPI: 75 year old male referred for evaluation of a atrial fibrillation with RVR. The patient has a history of centrilobular emphysema, GERD, and CKD on low dose lisinopril. Sinus arrhythmia was noted previously, but the patient denies a prior history of atrial fibrillation. The patient reports a 4-day history of worsening shortness of breath in the setting of a 46-month history of sinusitis. He was placed on antibiotics and steroids as outpatient. He reports a recent history of shortness of breath with minimal activities, such as showering. He denies orthopnea or exertional chest pain. He reports chest soreness secondary to excessive coughing. He reports a recent history of mild pedal edema. The patient presented to [facility] ER and was noted to be in atrial fibrillation with RVR with SpO2 91%. Chest xray was negative for pulmonary edema or active disease. ECG revealed atrial fibrillation at a rate of 137 bpm with RBBB and LPFB. The patient received 10 and 15 mg of diltiazem with improvement of rates to the 100s. Admission labs notable for creatinine 1.75, BUN 37, high sensitivity troponin 6 and 8, BNP 152, TSH 1.33, T4 0.78, D-dimer 1263, and Covid negative. The patient denies a history of MI, stroke, heart failure, diabetes, CKD, PE/DVT. Currently the patient reports feeling better than when he arrived, denies palpitations. He continues to cough. He has a chads vasc score of 2 (age). The patient has no documented history of hypertension.   Review of systems complete and found to be negative unless listed above     Past Medical History:  Diagnosis Date  . Arthritis   . COPD (chronic obstructive pulmonary disease)  ([place])   . GERD (gastroesophageal reflux disease)   . Headache   . Orthostatic dizziness   . PONV (postoperative nausea and vomiting)   . Torn rotator cuff    left    Past Surgical History:  Procedure Laterality Date  . COLONOSCOPY    . EXCISION CHONCHA BULLOSA Bilateral 06/02/2015   Procedure: BILATERAL CHONCHA BULLOSA;  Surgeon: Carloyn Manner, MD;  Location: [place];  Service: ENT;  Laterality: Bilateral;  . MAXILLARY ANTROSTOMY Bilateral 06/02/2015   Procedure: MAXILLARY ANTROSTOMY;  Surgeon: Carloyn Manner, MD;  Location: [place];  Service: ENT;  Laterality: Bilateral;  . NASAL POLYP SURGERY    . NECK SURGERY  2008   no limitations    (Not in a hospital admission)  Social History   Socioeconomic History  . Marital status: Married    Spouse name: Not on file  . Number of children: Not on file  . Years of education: Not on file  . Highest education level: Not on file  Occupational History  . Not on file  Tobacco Use  . Smoking status: Current Every Day Smoker    Packs/day: 0.50    Years: 35.00    Pack years: 17.50    Types: Cigarettes  . Smokeless tobacco: Never Used  Vaping Use  . Vaping Use: Never used  Substance and Sexual Activity  . Alcohol use: No  . Drug use: No  . Sexual activity: Not on file  Other Topics Concern  . Not on file  Social History Narrative  . Not on file   Social  Determinants of Health   Financial Resource Strain: Not on file  Food Insecurity: Not on file  Transportation Needs: Not on file  Physical Activity: Not on file  Stress: Not on file  Social Connections: Not on file  Intimate Partner Violence: Not on file    No family history on file.    Review of systems complete and found to be negative unless listed above      PHYSICAL EXAM  General: Well developed, well nourished, in no acute distress, lying at a slight incline in bed HEENT:  Normocephalic and atramatic Neck:  No JVD.  Lungs: normal  effort of breathing on room air, deep inspiration induces coughing Heart: irregularly irregular. without gallops or murmurs.  Abdomen: nondistended Msk:  Back normal, gait not assessed.Normal strength and tone for age. Extremities: No clubbing, cyanosis or edema.   Neuro: Alert and oriented X 3. Psych:  Good affect, responds appropriately  Labs:   Lab Results  Component Value Date   WBC 7.7 06/21/2020   HGB 12.6 (L) 06/21/2020   HCT 38.3 (L) 06/21/2020   MCV 100.5 (H) 06/21/2020   PLT 158 06/21/2020    Recent Labs  Lab 06/21/20 0152  NA 137  K 4.3  CL 102  CO2 21*  BUN 37*  CREATININE 1.75*  CALCIUM 9.0  PROT 7.3  BILITOT 0.6  ALKPHOS 36*  ALT 15  AST 29  GLUCOSE 144*   Lab Results  Component Value Date   CKTOTAL 59 06/14/2012   CKMB 1.4 06/14/2012   TROPONINI < 0.02 06/14/2012   No results found for: CHOL No results found for: HDL No results found for: LDLCALC No results found for: TRIG No results found for: CHOLHDL No results found for: LDLDIRECT    Radiology: DG Chest Portable 1 View  Result Date: 06/21/2020 CLINICAL DATA:  Shortness of breath. COVID negative. With RVR. Upon arrival patient is in supraventricular tachycardia. EXAM: PORTABLE CHEST 1 VIEW COMPARISON:  Chest x-ray 07/28/2016 FINDINGS: The heart size and mediastinal contours are unchanged. Aortic arch calcifications. No focal consolidation. No pulmonary edema. No pleural effusion. No pneumothorax. No acute osseous abnormality. IMPRESSION: No active disease. Electronically Signed   By: Iven Finn M.D.   On: 06/21/2020 02:13    EKG: atrial fibrillation, rate 137 bpm, RBBB, LPFB  ASSESSMENT AND PLAN:  1. New onset atrial fibrillation in the setting of sinusitis and bronchitis. Chads vasc score of 2 (age). On diltiazem 30 mg q 6 hours. 2. Centrilobular emphysema, on Trelegy and Advair at home, currently receiving DuoNebs and prednisone 3. AKI on CKD, on low dose lisinopril 4. Mildly elevated  BNP, does not appear to be in heart failure; likely elevated secondary to atrial fibrillation with RVR  Recommendations: 1. Agree with current therapy 2. Switch from diltiazem 30 mg q 6 hours to Cardizem CD 180 mg daily 3. Discussed anticoagulation with the patient and he opts to start. Will await findings of CTA before starting Eliquis; if patient has PE, will start Eliquis 10 mg BID 4. Obtain 2D echocardiogram 5. Avoid nephrotoxic agents 6. Further recommendations pending patient's initial course.  Signed: Clabe Seal PA-C 06/21/2020, 8:26 AM

## 2020-06-21 NOTE — H&P (Addendum)
[facility]   PATIENT NAME: Riley Martin    MR#:  [id number]  DATE OF BIRTH:  1945-10-24  DATE OF ADMISSION:  06/21/2020  PRIMARY CARE PHYSICIAN: [name], MD   Patient is coming from: Home REQUESTING/REFERRING PHYSICIAN: Marjean Donna, MD  CHIEF COMPLAINT:   Chief Complaint  Patient presents with  . Shortness of Breath    HISTORY OF PRESENT ILLNESS:  Riley Martin is a 75 y.o. Caucasian male with medical history significant for COPD, GERD, as well as previous history of brief atrial fibrillation, who presented to the emergency room with acute onset of acute onset of worsening dyspnea with associated chest pain and palpitations.  He has been having cough productive of clear thick sputum.  He was recently treated on an outpatient basis with p.o. Levaquin for acute sinusitis and bronchitis.  Outpatient chest x-ray was clear.  His T-max was 99.8.  No dysuria, oliguria or hematuria or flank pain.  No nausea vomiting or diarrhea.  He has not been vaccinated for COVID-19 and refuses to do so as he thinks people die from the shot and despite discussion about vaccine safety he continues to refuse being vaccinated while he is here. ED Course: When he came to the ER heart rate was 171 with otherwise normal vital signs.  Labs were remarkable for a BUN of 37 creatinine 1.75 compared to 22/1.17 on 12/24/2015 with magnesium of 2 and potassium 4.3 and unremarkable CBC.  BNP was 152.9 and high-sensitivity troponin I was 6, and later 8.  Influenza antigens and COVID-19 PCR came back negative.   EKG as reviewed by me : Showed wide-complex tachycardia initially with a rate of 171 with multiform PVCs, right bundle branch block and left posterior fascicular block.  Another repeat EKG within 10 minutes showed atrial fibrillation with rapid ventricle response of 137 with PVCs, right bundle branch block and left posterior fascicular block. Imaging: 2 view x-ray showed no acute cardiopulmonary  disease.  The patient was given 10 mg of IV Cardizem, 50 mg of IV Cardizem later around and then 30 mg of p.o. Cardizem and his heart rate was in the 90s during my interview but still in atrial fibrillation.  For preparation for chest CTA, he was given prep for IV contrast allergy with 50 mg p.o. Benadryl and 200 mg of IV Solu-Cortef.  He was also given 500 mill IV normal saline bolus and 4 mg of IV Zofran.  He will be admitted to a progressive unit bed for further evaluation and management.  PAST MEDICAL HISTORY:   Past Medical History:  Diagnosis Date  . Arthritis   . COPD (chronic obstructive pulmonary disease) ([place])   . GERD (gastroesophageal reflux disease)   . Headache   . Orthostatic dizziness   . PONV (postoperative nausea and vomiting)   . Torn rotator cuff    left    PAST SURGICAL HISTORY:   Past Surgical History:  Procedure Laterality Date  . COLONOSCOPY    . EXCISION CHONCHA BULLOSA Bilateral 06/02/2015   Procedure: BILATERAL CHONCHA BULLOSA;  Surgeon: Carloyn Manner, MD;  Location: [place];  Service: ENT;  Laterality: Bilateral;  . MAXILLARY ANTROSTOMY Bilateral 06/02/2015   Procedure: MAXILLARY ANTROSTOMY;  Surgeon: Carloyn Manner, MD;  Location: [place];  Service: ENT;  Laterality: Bilateral;  . NASAL POLYP SURGERY    . NECK SURGERY  2008   no limitations    SOCIAL HISTORY:   Social History  Tobacco Use  . Smoking status: Current Every Day Smoker    Packs/day: 0.50    Years: 35.00    Pack years: 17.50    Types: Cigarettes  . Smokeless tobacco: Never Used  Substance Use Topics  . Alcohol use: No    FAMILY HISTORY:  Positive for MI into these brothers. DRUG ALLERGIES:   Allergies  Allergen Reactions  . Tramadol Other (See Comments) and Shortness Of Breath    Other reaction(s): Other (See Comments) Other Reaction: tachy Dizziness and unsteady gait.  Clementeen Hoof [Iodinated Diagnostic Agents] Other (See Comments)    Dry  heaves, patient states he felt like he was on fire and about to pass out.   . Pregabalin Palpitations    Other reaction(s): Other (See Comments) Other Reaction: edema    REVIEW OF SYSTEMS:   ROS As per history of present illness. All pertinent systems were reviewed above. Constitutional, HEENT, cardiovascular, respiratory, GI, GU, musculoskeletal, neuro, psychiatric, endocrine, integumentary and hematologic systems were reviewed and are otherwise negative/unremarkable except for positive findings mentioned above in the HPI.   MEDICATIONS AT HOME:   Prior to Admission medications   Medication Sig Start Date End Date Taking? Authorizing Provider  ADVAIR DISKUS 250-50 MCG/DOSE AEPB INHALE ONE PUFF BY MOUTH TWICE A DAY. [place] MOUTH OUT AFTER USE 06/03/20  Yes [provider]  fluticasone (FLONASE) 50 MCG/ACT nasal spray SPRAY 2 SPRAYS INTO EACH NOSTRIL EVERY DAY 02/07/17  Yes Laverle Hobby, MD  levofloxacin (LEVAQUIN) 750 MG tablet ONE TAB BY MOUTH DAILY X 10 DAYS 06/15/20  Yes [provider]  LINZESS 290 MCG CAPS capsule TAKE 1 CAPSULE ([place]) BY MOUTH ONCE DAILY 04/16/20  Yes [provider]  lisinopril (ZESTRIL) 2.5 MG tablet Take 2.5 mg by mouth daily. 06/05/20  Yes [provider]  oxyCODONE-acetaminophen (PERCOCET) 10-325 MG tablet Take 1 tablet by mouth 5 (five) times daily as needed. 05/23/20  Yes [provider]  OXYCONTIN 15 MG 12 hr tablet Take 15 mg by mouth every 8 (eight) hours. 05/23/20  Yes [provider]  pantoprazole (PROTONIX) 20 MG tablet Take 20 mg by mouth daily. 06/15/20  Yes [provider]  predniSONE (STERAPRED UNI-PAK 21 TAB) 10 MG (21) TBPK tablet TAKE 6 TABLETS ON DAY 1 AS DIRECTED ON PACKAGE AND DECREASE BY 1 TAB EACH DAY FOR A TOTAL OF 6 DAYS 06/15/20  Yes [provider]  tamsulosin (FLOMAX) 0.4 MG CAPS capsule Take 0.4 mg by mouth daily. 09/03/16  Yes [provider]  VENTOLIN  HFA 108 (90 Base) MCG/ACT inhaler Inhale 2 puffs into the lungs every 6 (six) hours as needed. 08/07/16  Yes [provider]      VITAL SIGNS:  Blood pressure 127/77, pulse (!) 105, temperature 98.5 F (36.9 C), temperature source Oral, resp. rate 13, height 6' (1.829 m), weight 65.8 kg, SpO2 95 %.  PHYSICAL EXAMINATION:  Physical Exam  GENERAL:  75 y.o.-year-old Caucasian male patient lying in the bed with no acute distress.  EYES: Pupils equal, round, reactive to light and accommodation. No scleral icterus. Extraocular muscles intact.  HEENT: Head atraumatic, normocephalic. Oropharynx and nasopharynx clear.  NECK:  Supple, no jugular venous distention. No thyroid enlargement, no tenderness.  LUNGS: Normal breath sounds bilaterally, no wheezing, rales,rhonchi or crepitation. No use of accessory muscles of respiration.  CARDIOVASCULAR: Irregular irregular rhythm S1, S2 normal. No murmurs, rubs, or gallops.  ABDOMEN: Soft, nondistended, nontender. Bowel sounds present. No organomegaly  or mass.  EXTREMITIES: No pedal edema, cyanosis, or clubbing.  NEUROLOGIC: Cranial nerves II through XII are intact. Muscle strength 5/5 in all extremities. Sensation intact. Gait not checked.  PSYCHIATRIC: The patient is alert and oriented x 3.  Normal affect and good eye contact. SKIN: No obvious rash, lesion, or ulcer.   LABORATORY PANEL:   CBC Recent Labs  Lab 06/21/20 0152  WBC 7.6  HGB 13.6  HCT 42.3  PLT 179   ------------------------------------------------------------------------------------------------------------------  Chemistries  Recent Labs  Lab 06/21/20 0152  NA 137  K 4.3  CL 102  CO2 21*  GLUCOSE 144*  BUN 37*  CREATININE 1.75*  CALCIUM 9.0  MG 2.0  AST 29  ALT 15  ALKPHOS 36*  BILITOT 0.6   ------------------------------------------------------------------------------------------------------------------  Cardiac Enzymes No results for input(s): TROPONINI  in the last 168 hours. ------------------------------------------------------------------------------------------------------------------  RADIOLOGY:  DG Chest Portable 1 View  Result Date: 06/21/2020 CLINICAL DATA:  Shortness of breath. COVID negative. With RVR. Upon arrival patient is in supraventricular tachycardia. EXAM: PORTABLE CHEST 1 VIEW COMPARISON:  Chest x-ray 07/28/2016 FINDINGS: The heart size and mediastinal contours are unchanged. Aortic arch calcifications. No focal consolidation. No pulmonary edema. No pleural effusion. No pneumothorax. No acute osseous abnormality. IMPRESSION: No active disease. Electronically Signed   By: Iven Finn M.D.   On: 06/21/2020 02:13      IMPRESSION AND PLAN:  Active Problems:   Atrial fibrillation with rapid ventricular response ([place]) 1.  Paroxysmal atrial fibrillation with rapid ventricular response. -The patient will be admitted to a progressive unit bed. -We will continue him on p.o. Cardizem drip can be later on switched to long-acting Cardizem CD. -2D echo will be obtained and a cardiology consult. -We will obtain a TSH level.  Added magnesium level came back normal at 2. -We will hold off his long-acting beta agonist and his inhalers. -I notified Dr. Ubaldo Glassing about the patient as he is acute nodal clinic patient.  2.  Elevated BNP with negative chest x-ray. -This could certainly be related to his atrial fibrillation with RVR.  He does not manifest clinical acute CHF.  3.  Recent acute sinusitis and bronchitis. -We will replace his Levaquin with p.o. Augmentin to avoid QT prolongation and exacerbation of his arrhythmias. -We will add mucolytic therapy as well as as needed duo nebs.  4.  Acute kidney injury, likely hypovolemic. -We will hydrate with IV normal saline and follow BMP. -We will avoid nephrotoxins.  5.  COPD without acute exacerbation. -I will hold off his Advair Diskus and Trelegy and place him on duo nebs as mentioned  above. -We will continue his prednisone taper though.  6.  Essential hypertension. -We will hold off his Zestril given acute kidney injury.  7.  BPH. -We will continue his Flomax.  DVT prophylaxis: Lovenox. Code Status: full code.   Family Communication:  The plan of care was discussed in details with the patient and his wife who was with him in the room.. I answered all questions. The patient agreed to proceed with the above mentioned plan. Further management will depend upon hospital course. Disposition Plan: Back to previous home environment Consults called: Cardiology consult to Dr.Fath. All the records are reviewed and case discussed with ED provider.  Status is: Inpatient  Remains inpatient appropriate because:Hemodynamically unstable, Ongoing diagnostic testing needed not appropriate for outpatient work up, Unsafe d/c plan, IV treatments appropriate due to intensity of illness or inability to take PO and Inpatient level of  care appropriate due to severity of illness   Dispo: The patient is from: Home              Anticipated d/c is to: Home              Anticipated d/c date is: 2 days              Patient currently is not medically stable to d/c.   Difficult to place patient No   TOTAL TIME TAKING CARE OF THIS PATIENT: 55 minutes.    Christel Mormon M.D on 06/21/2020 at 5:29 AM  [facility]   From 7 PM-7 AM, contact night-coverage [email or web address]  CC: Primary care physician; [name], MD

## 2020-06-21 NOTE — Consult Note (Signed)
ANTICOAGULATION CONSULT NOTE - Initial Consult  Pharmacy Consult for apixaban Indication: atrial fibrillation  Allergies  Allergen Reactions  . Tramadol Other (See Comments) and Shortness Of Breath    Other reaction(s): Other (See Comments) Other Reaction: tachy Dizziness and unsteady gait.  Clementeen Hoof [Iodinated Diagnostic Agents] Other (See Comments)    Dry heaves, patient states he felt like he was on fire and about to pass out.   . Pregabalin Palpitations    Other reaction(s): Other (See Comments) Other Reaction: edema    Patient Measurements: Height: 6' (182.9 cm) Weight: 65.8 kg (145 lb) IBW/kg (Calculated) : 77.6  Vital Signs: BP: 125/58 (02/14 1300) Pulse Rate: 38 (02/14 1300)  Labs: Recent Labs    06/21/20 0152 06/21/20 0354 06/21/20 0603  HGB 13.6  --  12.6*  HCT 42.3  --  38.3*  PLT 179  --  158  CREATININE 1.75*  --   --   TROPONINIHS 6 8  --     Estimated Creatinine Clearance: 33.4 mL/min (A) (by C-G formula based on SCr of 1.75 mg/dL (H)).   Medical History: Past Medical History:  Diagnosis Date  . Arthritis   . COPD (chronic obstructive pulmonary disease) ([place])   . GERD (gastroesophageal reflux disease)   . Headache   . Orthostatic dizziness   . PONV (postoperative nausea and vomiting)   . Torn rotator cuff    left    Medications:  No PTA APT or AC No AC allergies  Assessment: 75 year old male presented to the emergency department with worsening dyspnea, was found to be in atrial fibrillation with rapid ventricular response. CTA negative for PE. Pharmacy has been consulted to start apixaban.  CHA2DS2-VASc score of 2 Hgb 12.6 and plts 158  Goal of Therapy:  Monitor platelets by anticoagulation protocol: Yes   Plan:  Start apixaban 5 mg BID Monitor CBC, s/s of bleed   Darnelle Bos, PharmD  06/21/2020,2:14 PM

## 2020-06-22 ENCOUNTER — Encounter: Payer: Self-pay | Admitting: Family Medicine

## 2020-06-22 DIAGNOSIS — I4891 Unspecified atrial fibrillation: Secondary | ICD-10-CM | POA: Diagnosis not present

## 2020-06-22 LAB — CBC
HCT: 37.6 % — ABNORMAL LOW (ref 39.0–52.0)
Hemoglobin: 12.4 g/dL — ABNORMAL LOW (ref 13.0–17.0)
MCH: 33.3 pg (ref 26.0–34.0)
MCHC: 33 g/dL (ref 30.0–36.0)
MCV: 101.1 fL — ABNORMAL HIGH (ref 80.0–100.0)
Platelets: 146 10*3/uL — ABNORMAL LOW (ref 150–400)
RBC: 3.72 MIL/uL — ABNORMAL LOW (ref 4.22–5.81)
RDW: 12.8 % (ref 11.5–15.5)
WBC: 6.4 10*3/uL (ref 4.0–10.5)
nRBC: 0 % (ref 0.0–0.2)

## 2020-06-22 LAB — BASIC METABOLIC PANEL
Anion gap: 6 (ref 5–15)
BUN: 39 mg/dL — ABNORMAL HIGH (ref 8–23)
CO2: 31 mmol/L (ref 22–32)
Calcium: 8.4 mg/dL — ABNORMAL LOW (ref 8.9–10.3)
Chloride: 101 mmol/L (ref 98–111)
Creatinine, Ser: 1.66 mg/dL — ABNORMAL HIGH (ref 0.61–1.24)
GFR, Estimated: 42 mL/min — ABNORMAL LOW (ref >60)
Glucose, Bld: 92 mg/dL (ref 70–99)
Potassium: 4.3 mmol/L (ref 3.5–5.1)
Sodium: 138 mmol/L (ref 135–145)

## 2020-06-22 MED ORDER — ACETAMINOPHEN 325 MG PO TABS: 650.0000 mg | ORAL_TABLET | ORAL | Status: DC | PRN

## 2020-06-22 MED ORDER — IPRATROPIUM-ALBUTEROL 0.5-2.5 (3) MG/3ML IN SOLN
3.0000 mL | RESPIRATORY_TRACT | Status: DC | PRN
  Administered 2020-06-22 – 2020-06-23 (×4): 3 mL via RESPIRATORY_TRACT
  Filled 2020-06-22 (×4): qty 3

## 2020-06-22 MED ORDER — MOMETASONE FURO-FORMOTEROL FUM 200-5 MCG/ACT IN AERO
2.0000 | INHALATION_SPRAY | Freq: Two times a day (BID) | RESPIRATORY_TRACT | Status: DC
  Administered 2020-06-22 – 2020-06-23 (×2): 2 via RESPIRATORY_TRACT
  Filled 2020-06-22: qty 8.8

## 2020-06-22 MED ORDER — SODIUM CHLORIDE 0.9% FLUSH
10.0000 mL | Freq: Two times a day (BID) | INTRAVENOUS | Status: DC
  Administered 2020-06-22: 10 mL via INTRAVENOUS

## 2020-06-22 NOTE — Progress Notes (Signed)
[facility] Cardiology    SUBJECTIVE: The patient reports feeling weak with exertional dyspnea this morning. He reports chest soreness with prolonged coughing. He denies palpitations. He states he thinks he would feel a lot better if he could cough up his chest congestion.   Vitals:   06/21/20 2200 06/22/20 0324 06/22/20 0632 06/22/20 0816  BP: (!) 128/56 113/64  135/61  Pulse: 67 66  71  Resp: 17 17  16   Temp: 97.7 F (36.5 C) 98.3 F (36.8 C)  98.4 F (36.9 C)  TempSrc: Oral   Oral  SpO2: 99% 96%  98%  Weight:   61.5 kg   Height:         Intake/Output Summary (Last 24 hours) at 06/22/2020 1751 Last data filed at 06/21/2020 2205 Gross per 24 hour  Intake 0 ml  Output 0 ml  Net 0 ml      PHYSICAL EXAM  General: Well developed, well nourished, in no acute distress, sitting on side of bed HEENT:  Normocephalic and atramatic Neck:  No JVD.  Lungs: course breath sounds, deep inspiration induces coughing, no wheezing Heart: irregularly irregular. Normal S1 and S2 without gallops or murmurs.  Abdomen: nondistended Msk:  Back normal, gait not assessed. Strength seems normal for age Extremities: No clubbing, cyanosis or edema.   Neuro: Alert and oriented X 3. Psych:  Good affect, responds appropriately   LABS: Basic Metabolic Panel: Recent Labs    06/21/20 0152 06/22/20 0554  NA 137 138  K 4.3 4.3  CL 102 101  CO2 21* 31  GLUCOSE 144* 92  BUN 37* 39*  CREATININE 1.75* 1.66*  CALCIUM 9.0 8.4*  MG 2.0  --    Liver Function Tests: Recent Labs    06/21/20 0152  AST 29  ALT 15  ALKPHOS 36*  BILITOT 0.6  PROT 7.3  ALBUMIN 4.2   No results for input(s): LIPASE, AMYLASE in the last 72 hours. CBC: Recent Labs    06/21/20 0152 06/21/20 0603 06/22/20 0554  WBC 7.6 7.7 6.4  NEUTROABS 6.3  --   --   HGB 13.6 12.6* 12.4*  HCT 42.3 38.3* 37.6*  MCV 101.7* 100.5* 101.1*  PLT 179 158 146*   Cardiac Enzymes: No results for input(s): CKTOTAL, CKMB, CKMBINDEX,  TROPONINI in the last 72 hours. BNP: Invalid input(s): POCBNP D-Dimer: No results for input(s): DDIMER in the last 72 hours. Hemoglobin A1C: No results for input(s): HGBA1C in the last 72 hours. Fasting Lipid Panel: No results for input(s): CHOL, HDL, LDLCALC, TRIG, CHOLHDL, LDLDIRECT in the last 72 hours. Thyroid Function Tests: Recent Labs    06/21/20 0152  TSH 1.334   Anemia Panel: No results for input(s): VITAMINB12, FOLATE, FERRITIN, TIBC, IRON, RETICCTPCT in the last 72 hours.  CT Angio Chest PE W and/or Wo Contrast  Result Date: 06/21/2020 CLINICAL DATA:  Shortness of breath. EXAM: CT ANGIOGRAPHY CHEST WITH CONTRAST TECHNIQUE: Multidetector CT imaging of the chest was performed using the standard protocol during bolus administration of intravenous contrast. Multiplanar CT image reconstructions and MIPs were obtained to evaluate the vascular anatomy. CONTRAST:  26mL OMNIPAQUE IOHEXOL 350 MG/ML SOLN COMPARISON:  October 14, 2014. FINDINGS: Cardiovascular: Satisfactory opacification of the pulmonary arteries to the segmental level. No evidence of pulmonary embolism. Normal heart size. No pericardial effusion. Atherosclerosis of thoracic aorta is noted without aneurysm formation. Mediastinum/Nodes: No enlarged mediastinal, hilar, or axillary lymph nodes. Thyroid gland, trachea, and esophagus demonstrate no significant findings. Lungs/Pleura: No pneumothorax or pleural  effusion is noted. Mild biapical scarring is noted. Stable calcified granuloma is noted in left upper lobe. No definite acute abnormality is noted. Upper Abdomen: No acute abnormality. Musculoskeletal: No chest wall abnormality. No acute or significant osseous findings. Review of the MIP images confirms the above findings. IMPRESSION: 1. No definite evidence of pulmonary embolus. 2. Aortic atherosclerosis. Aortic Atherosclerosis (ICD10-I70.0). Electronically Signed   By: Marijo Conception M.D.   On: 06/21/2020 10:41   DG Chest  Portable 1 View  Result Date: 06/21/2020 CLINICAL DATA:  Shortness of breath. COVID negative. With RVR. Upon arrival patient is in supraventricular tachycardia. EXAM: PORTABLE CHEST 1 VIEW COMPARISON:  Chest x-ray 07/28/2016 FINDINGS: The heart size and mediastinal contours are unchanged. Aortic arch calcifications. No focal consolidation. No pulmonary edema. No pleural effusion. No pneumothorax. No acute osseous abnormality. IMPRESSION: No active disease. Electronically Signed   By: Iven Finn M.D.   On: 06/21/2020 02:13   ECHOCARDIOGRAM COMPLETE  Result Date: 06/21/2020    ECHOCARDIOGRAM REPORT   Patient Name:   [facility] Date of Exam: 06/21/2020 Medical Rec #:  [id number]          Height:       72.0 in Accession #:    [id number]         Weight:       145.0 lb Date of Birth:  April 14, 1946          BSA:          1.858 m Patient Age:    75 years           BP:           128/70 mmHg Patient Gender: M                  HR:           80 bpm. Exam Location:  [facility] Procedure: 2D Echo, Limited Color Doppler and Cardiac Doppler Indications:     I48.91 Atrial Fibrillation  History:         Patient has no prior history of Echocardiogram examinations.                  COPD, Arrythmias:Atrial Fibrillation; Risk Factors:Former                  Smoker.  Sonographer:     Charmayne Sheer RDCS (AE) Referring Phys:  [id number] Sidney Ace Diagnosing Phys: Isaias Cowman MD  Sonographer Comments: Technically challenging study due to limited acoustic windows, no apical window and no subcostal window. IMPRESSIONS  1. Left ventricular ejection fraction, by estimation, is 50 to 55%. The left ventricle has low normal function. The left ventricle has no regional wall motion abnormalities. Left ventricular diastolic parameters are indeterminate.  2. Right ventricular systolic function is normal. The right ventricular size is normal.  3. The mitral valve is normal in structure. Mild mitral valve regurgitation. No evidence of  mitral stenosis.  4. The aortic valve is normal in structure. Aortic valve regurgitation is not visualized. No aortic stenosis is present.  5. The inferior vena cava is normal in size with greater than 50% respiratory variability, suggesting right atrial pressure of 3 mmHg. FINDINGS  Left Ventricle: Left ventricular ejection fraction, by estimation, is 50 to 55%. The left ventricle has low normal function. The left ventricle has no regional wall motion abnormalities. The left ventricular internal cavity size was normal in size. There is no left ventricular hypertrophy. Left ventricular  diastolic parameters are indeterminate. Right Ventricle: The right ventricular size is normal. No increase in right ventricular wall thickness. Right ventricular systolic function is normal. Left Atrium: Left atrial size was normal in size. Right Atrium: Right atrial size was normal in size. Pericardium: There is no evidence of pericardial effusion. Mitral Valve: The mitral valve is normal in structure. Mild mitral valve regurgitation. No evidence of mitral valve stenosis. Tricuspid Valve: The tricuspid valve is normal in structure. Tricuspid valve regurgitation is mild . No evidence of tricuspid stenosis. Aortic Valve: The aortic valve is normal in structure. Aortic valve regurgitation is not visualized. No aortic stenosis is present. Pulmonic Valve: The pulmonic valve was normal in structure. Pulmonic valve regurgitation is not visualized. No evidence of pulmonic stenosis. Aorta: The aortic root is normal in size and structure. Venous: The inferior vena cava is normal in size with greater than 50% respiratory variability, suggesting right atrial pressure of 3 mmHg. IAS/Shunts: No atrial level shunt detected by color flow Doppler.  LEFT VENTRICLE PLAX 2D LVIDd:         4.10 cm LVIDs:         3.00 cm LV PW:         1.00 cm LV IVS:        0.80 cm LVOT diam:     2.00 cm LVOT Area:     3.14 cm  LEFT ATRIUM         Index LA diam:    3.30  cm 1.78 cm/m                        PULMONIC VALVE AORTA                 PV Vmax:       1.03 m/s Ao Root diam: 2.50 cm PV Vmean:      75.200 cm/s                       PV VTI:        0.142 m                       PV Peak grad:  4.2 mmHg                       PV Mean grad:  3.0 mmHg   SHUNTS Systemic Diam: 2.00 cm Isaias Cowman MD Electronically signed by Isaias Cowman MD Signature Date/Time: 06/21/2020/1:10:30 PM    Final      Echo LVEF 50-55%. No RWMA, mild MR  TELEMETRY: atrial fibrillation, rate controlled  ASSESSMENT AND PLAN:  Active Problems:   Atrial fibrillation with rapid ventricular response (HCC)    1. New onset atrial fibrillation in the setting of sinusitis and bronchitis. Chads vasc score of 2 (age). Started on Cardizem CD 180 mg daily and is now rate controlled. Started on Eliquis 5 mg BID for stroke prevention. 2. Centrilobular emphysema, on Trelegy and Advair at home, currently receiving DuoNebs and prednisone 3. AKI on CKD, on low dose lisinopril 4. Mildly elevated BNP, does not appear to be in heart failure; likely elevated secondary to atrial fibrillation with RVR  Recommendations: 1. Agree with current therapy 2. Continue Cardizem CD 180 mg daily for rate control 3. Continue Eliquis 5 mg BID for stroke prevention 4. Avoid nephrotoxic agents 5. Recommend mucolytic and nebulizer 6. Consider discharge tomorrow if patient's overall condition  improves.   Clabe Seal, PA-C 06/22/2020 8:32 AM

## 2020-06-22 NOTE — Progress Notes (Signed)
PROGRESS NOTE    Riley Martin  ZDG:[id number] DOB: 1945/09/04 DOA: 06/21/2020 PCP: [name], MD  Brief Narrative:  75 year old male presented to the emergency department was found to be in atrial fibrillation with rapid ventricular response.  No previous endorse history of arrhythmia.  Patient presented with worsening dyspnea.  On admission the patient initially required Cardizem gtt. however has been transitioned to long-acting p.o. Cardizem per the cardiology service.  [facility] clinic cardiology on consult.CTA thorax negative for pulmonary embolism.  Cardiology discussed with patient and agree to start on anticoagulation.  Eliquis 5 twice daily started.  TTE performed, EF 50 to 55% with no wall motion abnormalities.  Diastolic parameters indeterminate.  Clinically does not appear in heart failure.  Patient remains in atrial fibrillation but rate is overall controlled.  Remains on p.o. Cardizem and p.o. Eliquis.  Endorses cough and chest congestion.   Assessment & Plan:   Active Problems:   Atrial fibrillation with rapid ventricular response (HCC)   Paroxysmal atrial fibrillation rate ventricular response Patient was on Cardizem gtt. Now switched to Cardizem CD Remains in atrial fibrillation but rate controlled Electrolytes overall within normal limits Echocardiogram EF 50 to 59%, diastolic parameters indeterminate Plan: Continue Cardizem CD Monitor on telemetry Patient may be stable for discharge 06/23/20 if rate remains controlled Appreciate cardiology involvement  Recent acute sinusitis and bronchitis Continue p.o. Augmentin Scheduled mucolytics Every 4 hours DuoNebs as needed Possible DC 2/16  Acute kidney injury Improving No IV fluids Possible prerenal azotemia Encourage p.o. fluid intake  COPD without acute exacerbation Resume home Dulera As needed duo nebs Replaced Levaquin with Augmentin given risk of QTC prolongation  IBS Continue home  Linzess  Chronic pain Continue home narcotic regimen  GERD PPI  Essential hypertension Continue lisinopril  DVT prophylaxis: Apixaban Code Status: Full Family Communication: None today Disposition Plan: Status is: Inpatient  Remains inpatient appropriate because:Inpatient level of care appropriate due to severity of illness   Dispo: The patient is from: Home              Anticipated d/c is to: Home              Anticipated d/c date is: 1 day              Patient currently is not medically stable to d/c.   Difficult to place patient No  Anticipated date of discharge 06/23/2020.  Discharge home if atrial fibrillation rate controlled and cough and congestion improved.     Level of care: Progressive Cardiac  Consultants:   Cardiology-[facility]  Procedures:   None  Antimicrobials:   Augmentin   Subjective: Patient seen and examined.  Denies chest pain however does endorse cough and chest congestion.  Objective: Vitals:   06/22/20 0324 06/22/20 0632 06/22/20 0816 06/22/20 1129  BP: 113/64  135/61 105/62  Pulse: 66  71 70  Resp: 17  16 14   Temp: 98.3 F (36.8 C)  98.4 F (36.9 C) 98.2 F (36.8 C)  TempSrc:   Oral Oral  SpO2: 96%  98% 98%  Weight:  61.5 kg    Height:        Intake/Output Summary (Last 24 hours) at 06/22/2020 1418 Last data filed at 06/22/2020 1358 Gross per 24 hour  Intake 480 ml  Output 0 ml  Net 480 ml   Filed Weights   06/21/20 0149 06/21/20 2159 06/22/20 5638  Weight: 65.8 kg 62.1 kg 61.5 kg    Examination:  General exam:  Appears calm and comfortable  Respiratory system: Scattered bibasilar crackles.  Normal work of breathing.  Room air  cardiovascular system: Regular rate, irregular rhythm, no murmurs, no pedal edema Gastrointestinal system: Abdomen is nondistended, soft and nontender. No organomegaly or masses felt. Normal bowel sounds heard. Central nervous system: Alert and oriented. No focal neurological  deficits. Extremities: Symmetric 5 x 5 power. Skin: No rashes, lesions or ulcers Psychiatry: Judgement and insight appear normal. Mood & affect appropriate.     Data Reviewed: I have personally reviewed following labs and imaging studies  CBC: Recent Labs  Lab 06/21/20 0152 06/21/20 0603 06/22/20 0554  WBC 7.6 7.7 6.4  NEUTROABS 6.3  --   --   HGB 13.6 12.6* 12.4*  HCT 42.3 38.3* 37.6*  MCV 101.7* 100.5* 101.1*  PLT 179 158 856*   Basic Metabolic Panel: Recent Labs  Lab 06/21/20 0152 06/22/20 0554  NA 137 138  K 4.3 4.3  CL 102 101  CO2 21* 31  GLUCOSE 144* 92  BUN 37* 39*  CREATININE 1.75* 1.66*  CALCIUM 9.0 8.4*  MG 2.0  --    GFR: Estimated Creatinine Clearance: 32.9 mL/min (A) (by C-G formula based on SCr of 1.66 mg/dL (H)). Liver Function Tests: Recent Labs  Lab 06/21/20 0152  AST 29  ALT 15  ALKPHOS 36*  BILITOT 0.6  PROT 7.3  ALBUMIN 4.2   No results for input(s): LIPASE, AMYLASE in the last 168 hours. No results for input(s): AMMONIA in the last 168 hours. Coagulation Profile: No results for input(s): INR, PROTIME in the last 168 hours. Cardiac Enzymes: No results for input(s): CKTOTAL, CKMB, CKMBINDEX, TROPONINI in the last 168 hours. BNP (last 3 results) No results for input(s): PROBNP in the last 8760 hours. HbA1C: No results for input(s): HGBA1C in the last 72 hours. CBG: No results for input(s): GLUCAP in the last 168 hours. Lipid Profile: No results for input(s): CHOL, HDL, LDLCALC, TRIG, CHOLHDL, LDLDIRECT in the last 72 hours. Thyroid Function Tests: Recent Labs    06/21/20 0152  TSH 1.334  FREET4 0.78   Anemia Panel: No results for input(s): VITAMINB12, FOLATE, FERRITIN, TIBC, IRON, RETICCTPCT in the last 72 hours. Sepsis Labs: No results for input(s): PROCALCITON, LATICACIDVEN in the last 168 hours.  Recent Results (from the past 240 hour(s))  Resp Panel by RT-PCR (Flu A&B, Covid) Nasopharyngeal Swab     Status: None    Collection Time: 06/21/20  1:54 AM   Specimen: Nasopharyngeal Swab; Nasopharyngeal(NP) swabs in vial transport medium  Result Value Ref Range Status   SARS Coronavirus 2 by RT PCR NEGATIVE NEGATIVE Final    Comment: (NOTE) SARS-CoV-2 target nucleic acids are NOT DETECTED.  The SARS-CoV-2 RNA is generally detectable in upper respiratory specimens during the acute phase of infection. The lowest concentration of SARS-CoV-2 viral copies this assay can detect is 138 copies/mL. A negative result does not preclude SARS-Cov-2 infection and should not be used as the sole basis for treatment or other patient management decisions. A negative result may occur with  improper specimen collection/handling, submission of specimen other than nasopharyngeal swab, presence of viral mutation(s) within the areas targeted by this assay, and inadequate number of viral copies(<138 copies/mL). A negative result must be combined with clinical observations, patient history, and epidemiological information. The expected result is Negative.  Fact Sheet for Patients:  EntrepreneurPulse.com.au  Fact Sheet for Healthcare Providers:  IncredibleEmployment.be  This test is no t yet approved or cleared by the [place]  States FDA and  has been authorized for detection and/or diagnosis of SARS-CoV-2 by FDA under an Emergency Use Authorization (EUA). This EUA will remain  in effect (meaning this test can be used) for the duration of the COVID-19 declaration under Section 564(b)(1) of the Act, 21 U.S.C.section 360bbb-3(b)(1), unless the authorization is terminated  or revoked sooner.       Influenza A by PCR NEGATIVE NEGATIVE Final   Influenza B by PCR NEGATIVE NEGATIVE Final    Comment: (NOTE) The Xpert Xpress SARS-CoV-2/FLU/RSV plus assay is intended as an aid in the diagnosis of influenza from Nasopharyngeal swab specimens and should not be used as a sole basis for treatment.  Nasal washings and aspirates are unacceptable for Xpert Xpress SARS-CoV-2/FLU/RSV testing.  Fact Sheet for Patients: EntrepreneurPulse.com.au  Fact Sheet for Healthcare Providers: IncredibleEmployment.be  This test is not yet approved or cleared by the [place] FDA and has been authorized for detection and/or diagnosis of SARS-CoV-2 by FDA under an Emergency Use Authorization (EUA). This EUA will remain in effect (meaning this test can be used) for the duration of the COVID-19 declaration under Section 564(b)(1) of the Act, 21 U.S.C. section 360bbb-3(b)(1), unless the authorization is terminated or revoked.  Performed at [facility], [address], [place], [place] [address]          Radiology Studies: CT Angio Chest PE W and/or Wo Contrast  Result Date: 06/21/2020 CLINICAL DATA:  Shortness of breath. EXAM: CT ANGIOGRAPHY CHEST WITH CONTRAST TECHNIQUE: Multidetector CT imaging of the chest was performed using the standard protocol during bolus administration of intravenous contrast. Multiplanar CT image reconstructions and MIPs were obtained to evaluate the vascular anatomy. CONTRAST:  46mL OMNIPAQUE IOHEXOL 350 MG/ML SOLN COMPARISON:  October 14, 2014. FINDINGS: Cardiovascular: Satisfactory opacification of the pulmonary arteries to the segmental level. No evidence of pulmonary embolism. Normal heart size. No pericardial effusion. Atherosclerosis of thoracic aorta is noted without aneurysm formation. Mediastinum/Nodes: No enlarged mediastinal, hilar, or axillary lymph nodes. Thyroid gland, trachea, and esophagus demonstrate no significant findings. Lungs/Pleura: No pneumothorax or pleural effusion is noted. Mild biapical scarring is noted. Stable calcified granuloma is noted in left upper lobe. No definite acute abnormality is noted. Upper Abdomen: No acute abnormality. Musculoskeletal: No chest wall abnormality. No acute or significant  osseous findings. Review of the MIP images confirms the above findings. IMPRESSION: 1. No definite evidence of pulmonary embolus. 2. Aortic atherosclerosis. Aortic Atherosclerosis (ICD10-I70.0). Electronically Signed   By: Marijo Conception M.D.   On: 06/21/2020 10:41   DG Chest Portable 1 View  Result Date: 06/21/2020 CLINICAL DATA:  Shortness of breath. COVID negative. With RVR. Upon arrival patient is in supraventricular tachycardia. EXAM: PORTABLE CHEST 1 VIEW COMPARISON:  Chest x-ray 07/28/2016 FINDINGS: The heart size and mediastinal contours are unchanged. Aortic arch calcifications. No focal consolidation. No pulmonary edema. No pleural effusion. No pneumothorax. No acute osseous abnormality. IMPRESSION: No active disease. Electronically Signed   By: Iven Finn M.D.   On: 06/21/2020 02:13   ECHOCARDIOGRAM COMPLETE  Result Date: 06/21/2020    ECHOCARDIOGRAM REPORT   Patient Name:   [facility] Date of Exam: 06/21/2020 Medical Rec #:  [id number]          Height:       72.0 in Accession #:    [id number]         Weight:       145.0 lb Date of Birth:  05-Aug-1945  BSA:          1.858 m Patient Age:    62 years           BP:           128/70 mmHg Patient Gender: M                  HR:           80 bpm. Exam Location:  [facility] Procedure: 2D Echo, Limited Color Doppler and Cardiac Doppler Indications:     I48.91 Atrial Fibrillation  History:         Patient has no prior history of Echocardiogram examinations.                  COPD, Arrythmias:Atrial Fibrillation; Risk Factors:Former                  Smoker.  Sonographer:     Charmayne Sheer RDCS (AE) Referring Phys:  [id number] Sidney Ace Diagnosing Phys: Isaias Cowman MD  Sonographer Comments: Technically challenging study due to limited acoustic windows, no apical window and no subcostal window. IMPRESSIONS  1. Left ventricular ejection fraction, by estimation, is 50 to 55%. The left ventricle has low normal function. The left ventricle  has no regional wall motion abnormalities. Left ventricular diastolic parameters are indeterminate.  2. Right ventricular systolic function is normal. The right ventricular size is normal.  3. The mitral valve is normal in structure. Mild mitral valve regurgitation. No evidence of mitral stenosis.  4. The aortic valve is normal in structure. Aortic valve regurgitation is not visualized. No aortic stenosis is present.  5. The inferior vena cava is normal in size with greater than 50% respiratory variability, suggesting right atrial pressure of 3 mmHg. FINDINGS  Left Ventricle: Left ventricular ejection fraction, by estimation, is 50 to 55%. The left ventricle has low normal function. The left ventricle has no regional wall motion abnormalities. The left ventricular internal cavity size was normal in size. There is no left ventricular hypertrophy. Left ventricular diastolic parameters are indeterminate. Right Ventricle: The right ventricular size is normal. No increase in right ventricular wall thickness. Right ventricular systolic function is normal. Left Atrium: Left atrial size was normal in size. Right Atrium: Right atrial size was normal in size. Pericardium: There is no evidence of pericardial effusion. Mitral Valve: The mitral valve is normal in structure. Mild mitral valve regurgitation. No evidence of mitral valve stenosis. Tricuspid Valve: The tricuspid valve is normal in structure. Tricuspid valve regurgitation is mild . No evidence of tricuspid stenosis. Aortic Valve: The aortic valve is normal in structure. Aortic valve regurgitation is not visualized. No aortic stenosis is present. Pulmonic Valve: The pulmonic valve was normal in structure. Pulmonic valve regurgitation is not visualized. No evidence of pulmonic stenosis. Aorta: The aortic root is normal in size and structure. Venous: The inferior vena cava is normal in size with greater than 50% respiratory variability, suggesting right atrial pressure  of 3 mmHg. IAS/Shunts: No atrial level shunt detected by color flow Doppler.  LEFT VENTRICLE PLAX 2D LVIDd:         4.10 cm LVIDs:         3.00 cm LV PW:         1.00 cm LV IVS:        0.80 cm LVOT diam:     2.00 cm LVOT Area:     3.14 cm  LEFT ATRIUM  Index LA diam:    3.30 cm 1.78 cm/m                        PULMONIC VALVE AORTA                 PV Vmax:       1.03 m/s Ao Root diam: 2.50 cm PV Vmean:      75.200 cm/s                       PV VTI:        0.142 m                       PV Peak grad:  4.2 mmHg                       PV Mean grad:  3.0 mmHg   SHUNTS Systemic Diam: 2.00 cm Isaias Cowman MD Electronically signed by Isaias Cowman MD Signature Date/Time: 06/21/2020/1:10:30 PM    Final         Scheduled Meds: . apixaban  5 mg Oral BID  . diltiazem  180 mg Oral Daily  . fluticasone  1 spray Each Nare Daily  . guaiFENesin  600 mg Oral BID  . linaclotide  145 mcg Oral QAC breakfast  . lisinopril  2.5 mg Oral Daily  . oxyCODONE  15 mg Oral Q8H  . pantoprazole  20 mg Oral Daily  . tamsulosin  0.4 mg Oral Daily   Continuous Infusions:   LOS: 1 day    Time spent: 25 minutes    Sidney Ace, MD [facility] Pager [phone]  If 7PM-7AM, please contact night-coverage 06/22/2020, 2:18 PM

## 2020-06-22 NOTE — Plan of Care (Signed)

## 2020-06-23 DIAGNOSIS — I4891 Unspecified atrial fibrillation: Secondary | ICD-10-CM | POA: Diagnosis not present

## 2020-06-23 MED ORDER — APIXABAN 5 MG PO TABS: 5.0000 mg | ORAL_TABLET | Freq: Two times a day (BID) | ORAL | 0 refills | Status: DC

## 2020-06-23 MED ORDER — DILTIAZEM HCL ER COATED BEADS 180 MG PO CP24: 180.0000 mg | ORAL_CAPSULE | Freq: Every day | ORAL | 0 refills | Status: DC

## 2020-06-23 MED ORDER — AMOXICILLIN-POT CLAVULANATE 875-125 MG PO TABS: 1.0000 | ORAL_TABLET | Freq: Two times a day (BID) | ORAL | 0 refills | Status: AC

## 2020-06-23 NOTE — Plan of Care (Signed)

## 2020-06-23 NOTE — Progress Notes (Signed)
[facility]    SUBJECTIVE: The patient reports feeling well this morning. He denies palpitations. He reports that his cough and breathing are better. He is eager to go home today.   Vitals:   06/22/20 1616 06/22/20 1925 06/23/20 0410 06/23/20 0822  BP: 105/60 (!) 96/59 111/60 124/74  Pulse: 68 68 (!) 57 66  Resp: 15 16 16 16   Temp: 98.6 F (37 C) 98.1 F (36.7 C) (!) 97.3 F (36.3 C) 98.2 F (36.8 C)  TempSrc: Oral Oral Oral Oral  SpO2: 95%  96% 95%  Weight:   60.8 kg   Height:         Intake/Output Summary (Last 24 hours) at 06/23/2020 2353 Last data filed at 06/22/2020 1358 Gross per 24 hour  Intake 480 ml  Output --  Net 480 ml      PHYSICAL EXAM  General: Well developed, well nourished, in no acute distress, smiling, sitting up in bed finishing breakfast HEENT:  Normocephalic and atramatic Neck:  No JVD.  Lungs: Clear bilaterally to auscultation, normal effort of breathing on room air Heart: irregularly irregular . Normal S1 and S2 without gallops or murmurs.  Abdomen: nondistended Msk:  Gait not assessed, normal strength and tone for age Extremities: No clubbing, cyanosis or edema.   Neuro: Alert and oriented X 3. Psych:  Good affect, responds appropriately   LABS: Basic Metabolic Panel: Recent Labs    06/21/20 0152 06/22/20 0554  NA 137 138  K 4.3 4.3  CL 102 101  CO2 21* 31  GLUCOSE 144* 92  BUN 37* 39*  CREATININE 1.75* 1.66*  CALCIUM 9.0 8.4*  MG 2.0  --    Liver Function Tests: Recent Labs    06/21/20 0152  AST 29  ALT 15  ALKPHOS 36*  BILITOT 0.6  PROT 7.3  ALBUMIN 4.2   No results for input(s): LIPASE, AMYLASE in the last 72 hours. CBC: Recent Labs    06/21/20 0152 06/21/20 0603 06/22/20 0554  WBC 7.6 7.7 6.4  NEUTROABS 6.3  --   --   HGB 13.6 12.6* 12.4*  HCT 42.3 38.3* 37.6*  MCV 101.7* 100.5* 101.1*  PLT 179 158 146*   Cardiac Enzymes: No results for input(s): CKTOTAL, CKMB, CKMBINDEX, TROPONINI in the last 72  hours. BNP: Invalid input(s): POCBNP D-Dimer: No results for input(s): DDIMER in the last 72 hours. Hemoglobin A1C: No results for input(s): HGBA1C in the last 72 hours. Fasting Lipid Panel: No results for input(s): CHOL, HDL, LDLCALC, TRIG, CHOLHDL, LDLDIRECT in the last 72 hours. Thyroid Function Tests: Recent Labs    06/21/20 0152  TSH 1.334   Anemia Panel: No results for input(s): VITAMINB12, FOLATE, FERRITIN, TIBC, IRON, RETICCTPCT in the last 72 hours.  CT Angio Chest PE W and/or Wo Contrast  Result Date: 06/21/2020 CLINICAL DATA:  Shortness of breath. EXAM: CT ANGIOGRAPHY CHEST WITH CONTRAST TECHNIQUE: Multidetector CT imaging of the chest was performed using the standard protocol during bolus administration of intravenous contrast. Multiplanar CT image reconstructions and MIPs were obtained to evaluate the vascular anatomy. CONTRAST:  14mL OMNIPAQUE IOHEXOL 350 MG/ML SOLN COMPARISON:  October 14, 2014. FINDINGS: Cardiovascular: Satisfactory opacification of the pulmonary arteries to the segmental level. No evidence of pulmonary embolism. Normal heart size. No pericardial effusion. Atherosclerosis of thoracic aorta is noted without aneurysm formation. Mediastinum/Nodes: No enlarged mediastinal, hilar, or axillary lymph nodes. Thyroid gland, trachea, and esophagus demonstrate no significant findings. Lungs/Pleura: No pneumothorax or pleural effusion is noted. Mild  biapical scarring is noted. Stable calcified granuloma is noted in left upper lobe. No definite acute abnormality is noted. Upper Abdomen: No acute abnormality. Musculoskeletal: No chest wall abnormality. No acute or significant osseous findings. Review of the MIP images confirms the above findings. IMPRESSION: 1. No definite evidence of pulmonary embolus. 2. Aortic atherosclerosis. Aortic Atherosclerosis (ICD10-I70.0). Electronically Signed   By: Marijo Conception M.D.   On: 06/21/2020 10:41   ECHOCARDIOGRAM COMPLETE  Result Date:  06/21/2020    ECHOCARDIOGRAM REPORT   Patient Name:   [facility] Chokshi Date of Exam: 06/21/2020 Medical Rec #:  [id number]          Height:       72.0 in Accession #:    [id number]         Weight:       145.0 lb Date of Birth:  1945/09/09          BSA:          1.858 m Patient Age:    75 years           BP:           128/70 mmHg Patient Gender: M                  HR:           80 bpm. Exam Location:  [facility] Procedure: 2D Echo, Limited Color Doppler and Cardiac Doppler Indications:     I48.91 Atrial Fibrillation  History:         Patient has no prior history of Echocardiogram examinations.                  COPD, Arrythmias:Atrial Fibrillation; Risk Factors:Former                  Smoker.  Sonographer:     Charmayne Sheer RDCS (AE) Referring Phys:  [id number] Sidney Ace Diagnosing Phys: Isaias Cowman MD  Sonographer Comments: Technically challenging study due to limited acoustic windows, no apical window and no subcostal window. IMPRESSIONS  1. Left ventricular ejection fraction, by estimation, is 50 to 55%. The left ventricle has low normal function. The left ventricle has no regional wall motion abnormalities. Left ventricular diastolic parameters are indeterminate.  2. Right ventricular systolic function is normal. The right ventricular size is normal.  3. The mitral valve is normal in structure. Mild mitral valve regurgitation. No evidence of mitral stenosis.  4. The aortic valve is normal in structure. Aortic valve regurgitation is not visualized. No aortic stenosis is present.  5. The inferior vena cava is normal in size with greater than 50% respiratory variability, suggesting right atrial pressure of 3 mmHg. FINDINGS  Left Ventricle: Left ventricular ejection fraction, by estimation, is 50 to 55%. The left ventricle has low normal function. The left ventricle has no regional wall motion abnormalities. The left ventricular internal cavity size was normal in size. There is no left ventricular hypertrophy.  Left ventricular diastolic parameters are indeterminate. Right Ventricle: The right ventricular size is normal. No increase in right ventricular wall thickness. Right ventricular systolic function is normal. Left Atrium: Left atrial size was normal in size. Right Atrium: Right atrial size was normal in size. Pericardium: There is no evidence of pericardial effusion. Mitral Valve: The mitral valve is normal in structure. Mild mitral valve regurgitation. No evidence of mitral valve stenosis. Tricuspid Valve: The tricuspid valve is normal in structure. Tricuspid valve regurgitation is mild . No evidence  of tricuspid stenosis. Aortic Valve: The aortic valve is normal in structure. Aortic valve regurgitation is not visualized. No aortic stenosis is present. Pulmonic Valve: The pulmonic valve was normal in structure. Pulmonic valve regurgitation is not visualized. No evidence of pulmonic stenosis. Aorta: The aortic root is normal in size and structure. Venous: The inferior vena cava is normal in size with greater than 50% respiratory variability, suggesting right atrial pressure of 3 mmHg. IAS/Shunts: No atrial level shunt detected by color flow Doppler.  LEFT VENTRICLE PLAX 2D LVIDd:         4.10 cm LVIDs:         3.00 cm LV PW:         1.00 cm LV IVS:        0.80 cm LVOT diam:     2.00 cm LVOT Area:     3.14 cm  LEFT ATRIUM         Index LA diam:    3.30 cm 1.78 cm/m                        PULMONIC VALVE AORTA                 PV Vmax:       1.03 m/s Ao Root diam: 2.50 cm PV Vmean:      75.200 cm/s                       PV VTI:        0.142 m                       PV Peak grad:  4.2 mmHg                       PV Mean grad:  3.0 mmHg   SHUNTS Systemic Diam: 2.00 cm Isaias Cowman MD Electronically signed by Isaias Cowman MD Signature Date/Time: 06/21/2020/1:10:30 PM    Final      Echo LVEF 50-55%, mild MR  TELEMETRY: atrial fibrillation, rate controlled  ASSESSMENT AND PLAN:  Active Problems:    Atrial fibrillation with rapid ventricular response ([place])    1. New onset atrial fibrillationin the setting of sinusitis and bronchitis. Chads vasc score of 2 (age). Started on Cardizem CD 180 mg daily and is now rate controlled. Started on Eliquis 5 mg BID for stroke prevention. 2. Centrilobular emphysema,on Trelegy and Advair at home, currently receiving DuoNebs, prednisone, and antibiotic 3. AKIon CKD, on low dose lisinopril 4. Mildly elevated BNP,does not appear to be in heart failure; likely elevated secondary to atrial fibrillation with RVR  Recommendations: 1. Agree with current therapy 2.Continue Cardizem CD 180 mg daily for rate control 3. Continue Eliquis 5 mg BID for stroke prevention 4. Follow up with Dr. Saralyn Pilar in 1 week 5. Recommend discharge today   Clabe Seal, Hershal Coria 06/23/2020 8:53 AM Sign off for now; please call with any questions.

## 2020-06-23 NOTE — TOC Progression Note (Signed)
Transition of Care [facility]) - Progression Note    Patient Details  Name: Riley Martin MRN: [id number] Date of Birth: [birth year]  [facility]) CM/SW [place], RN Phone Number: 06/23/2020, 11:35 AM  Clinical Narrative:  Patient for discharge today per Attending during Transition rounds, Eliquis coupon recommended. Patient not in room, Eliquis coupon left on bedside table.          Expected Discharge Plan and Services           Expected Discharge Date: 06/23/20                                     Social Determinants of Health (SDOH) Interventions    Readmission Risk Interventions No flowsheet data found.

## 2020-06-25 NOTE — Discharge Summary (Signed)
[facility] [place] NAME: Riley Martin    MR#:  [id number]  DATE OF BIRTH:  April 29, 1946  DATE OF ADMISSION:  06/21/2020   ADMITTING PHYSICIAN: Christel Mormon, MD  DATE OF DISCHARGE: 06/23/2020 11:29 AM  PRIMARY CARE PHYSICIAN: [name], MD   ADMISSION DIAGNOSIS:  Atrial fibrillation with rapid ventricular response ([place]) [I48.91] DISCHARGE DIAGNOSIS:  Active Problems:   Atrial fibrillation with rapid ventricular response ([place])  SECONDARY DIAGNOSIS:   Past Medical History:  Diagnosis Date  . Arthritis   . COPD (chronic obstructive pulmonary disease) ([place])   . GERD (gastroesophageal reflux disease)   . Headache   . Orthostatic dizziness   . PONV (postoperative nausea and vomiting)   . Torn rotator cuff    left   HOSPITAL COURSE:  75 year old male admitted for atrial fibrillation with rapid ventricular response. No previous endorse history of arrhythmia. Patient presented with worsening dyspnea. On admission the patient initially required Cardizem gtt. however has been transitioned to long-acting p.o. Cardizem per the cardiology service. CTA negative for pulmonary embolism. Cardiology discussed with patient and agree to start on anticoagulation. Eliquis 5 twice daily started.  TTE performed, EF 50 to 55% with no wall motion abnormalities.  Diastolic parameters indeterminate.  Clinically does not appear in heart failure.  Paroxysmal atrial fibrillation rate ventricular response In the setting of sinusitis and bronchitis ChadVasc score of 2 Patient was initially on Cardizem gtt -> switched to Cardizem CD 180 mg once daily with good control of HR Remains in atrial fibrillation but rate controlled Electrolytes within normal limits Echocardiogram EF 50 to 56%, diastolic parameters indeterminate Started Eliquis 5 mg po BID for stroke prevention  Recent acute sinusitis and bronchitis Treated with Augmentin, scheduled mucolytics & Every 4 hours  DuoNebs as needed with good improvement  Acute kidney injury Improved with IV hydration. Due to prerenal azotemia Encouraged p.o. fluid intake  COPD without acute exacerbation Stable.   IBS Continue home Linzess  Chronic pain Continue home narcotic regimen  GERD PPI  Essential hypertension Continue lisinopril  Patient was eager to go home on 2/16 and after d/w cardio he was D/C home in stable condition.   DISCHARGE CONDITIONS:  stable CONSULTS OBTAINED:   DRUG ALLERGIES:   Allergies  Allergen Reactions  . Tramadol Other (See Comments) and Shortness Of Breath    Other reaction(s): Other (See Comments) Other Reaction: tachy Dizziness and unsteady gait.  Clementeen Hoof [Iodinated Diagnostic Agents] Other (See Comments)    Dry heaves, patient states he felt like he was on fire and about to pass out.   . Pregabalin Palpitations    Other reaction(s): Other (See Comments) Other Reaction: edema   DISCHARGE MEDICATIONS:   Allergies as of 06/23/2020      Reactions   Tramadol Other (See Comments), Shortness Of Breath   Other reaction(s): Other (See Comments) Other Reaction: tachy Dizziness and unsteady gait.   Ivp Dye [iodinated Diagnostic Agents] Other (See Comments)   Dry heaves, patient states he felt like he was on fire and about to pass out.    Pregabalin Palpitations   Other reaction(s): Other (See Comments) Other Reaction: edema      Medication List    STOP taking these medications   levofloxacin 750 MG tablet Commonly known as: LEVAQUIN   predniSONE 10 MG (21) Tbpk tablet Commonly known as: STERAPRED UNI-PAK 21 TAB     TAKE these medications   Advair Diskus 250-50 MCG/DOSE Aepb  Generic drug: Fluticasone-Salmeterol INHALE ONE PUFF BY MOUTH TWICE A DAY. [place] MOUTH OUT AFTER USE   amoxicillin-clavulanate 875-125 MG tablet Commonly known as: Augmentin Take 1 tablet by mouth every 12 (twelve) hours for 3 days.   apixaban 5 MG Tabs tablet Commonly  known as: ELIQUIS Take 1 tablet (5 mg total) by mouth 2 (two) times daily.   diltiazem 180 MG 24 hr capsule Commonly known as: CARDIZEM CD Take 1 capsule (180 mg total) by mouth daily.   fluticasone 50 MCG/ACT nasal spray Commonly known as: FLONASE SPRAY 2 SPRAYS INTO EACH NOSTRIL EVERY DAY   Linzess 290 MCG Caps capsule Generic drug: linaclotide TAKE 1 CAPSULE (290 MCG TOTAL) BY MOUTH ONCE DAILY   lisinopril 2.5 MG tablet Commonly known as: ZESTRIL Take 2.5 mg by mouth daily.   oxyCODONE-acetaminophen 10-325 MG tablet Commonly known as: PERCOCET Take 1 tablet by mouth 5 (five) times daily as needed.   OxyCONTIN 15 mg 12 hr tablet Generic drug: oxyCODONE Take 15 mg by mouth every 8 (eight) hours.   pantoprazole 20 MG tablet Commonly known as: PROTONIX Take 20 mg by mouth daily.   tamsulosin 0.4 MG Caps capsule Commonly known as: FLOMAX Take 0.4 mg by mouth daily.   Ventolin HFA 108 (90 Base) MCG/ACT inhaler Generic drug: albuterol Inhale 2 puffs into the lungs every 6 (six) hours as needed.      DISCHARGE INSTRUCTIONS:   DIET:  Cardiac diet DISCHARGE CONDITION:  Good ACTIVITY:  Activity as tolerated OXYGEN:  Home Oxygen: No.  Oxygen Delivery: room air DISCHARGE LOCATION:  home   If you experience worsening of your admission symptoms, develop shortness of breath, life threatening emergency, suicidal or homicidal thoughts you must seek medical attention immediately by calling 911 or calling your MD immediately  if symptoms less severe.  You Must read complete instructions/literature along with all the possible adverse reactions/side effects for all the Medicines you take and that have been prescribed to you. Take any new Medicines after you have completely understood and accpet all the possible adverse reactions/side effects.   Please note  You were cared for by a hospitalist during your hospital stay. If you have any questions about your discharge  medications or the care you received while you were in the hospital after you are discharged, you can call the unit and asked to speak with the hospitalist on call if the hospitalist that took care of you is not available. Once you are discharged, your primary care physician will handle any further medical issues. Please note that NO REFILLS for any discharge medications will be authorized once you are discharged, as it is imperative that you return to your primary care physician (or establish a relationship with a primary care physician if you do not have one) for your aftercare needs so that they can reassess your need for medications and monitor your lab values.    On the day of Discharge:  VITAL SIGNS:  Blood pressure 124/74, pulse 66, temperature 98.2 F (36.8 C), temperature source Oral, resp. rate 16, height 6' (1.829 m), weight 60.8 kg, SpO2 95 %. PHYSICAL EXAMINATION:  GENERAL:  75 y.o.-year-old patient lying in the bed with no acute distress.  EYES: Pupils equal, round, reactive to light and accommodation. No scleral icterus. Extraocular muscles intact.  HEENT: Head atraumatic, normocephalic. Oropharynx and nasopharynx clear.  NECK:  Supple, no jugular venous distention. No thyroid enlargement, no tenderness.  LUNGS: Normal breath sounds bilaterally, no wheezing, rales,rhonchi or  crepitation. No use of accessory muscles of respiration.  CARDIOVASCULAR: S1, S2 normal. No murmurs, rubs, or gallops.  ABDOMEN: Soft, non-tender, non-distended. Bowel sounds present. No organomegaly or mass.  EXTREMITIES: No pedal edema, cyanosis, or clubbing.  NEUROLOGIC: Cranial nerves II through XII are intact. Muscle strength 5/5 in all extremities. Sensation intact. Gait not checked.  PSYCHIATRIC: The patient is alert and oriented x 3.  SKIN: No obvious rash, lesion, or ulcer.  DATA REVIEW:   CBC Recent Labs  Lab 06/22/20 0554  WBC 6.4  HGB 12.4*  HCT 37.6*  PLT 146*    Chemistries  Recent  Labs  Lab 06/21/20 0152 06/22/20 0554  NA 137 138  K 4.3 4.3  CL 102 101  CO2 21* 31  GLUCOSE 144* 92  BUN 37* 39*  CREATININE 1.75* 1.66*  CALCIUM 9.0 8.4*  MG 2.0  --   AST 29  --   ALT 15  --   ALKPHOS 36*  --   BILITOT 0.6  --      Outpatient follow-up  Follow-up Information    Isaias Cowman, MD. Go on 07/05/2020.   Specialty: Cardiology Why: @ 11:30am Contact information: [address] [facility] [place] [address] [phone]        [name], MD. Schedule an appointment as soon as possible for a visit on 06/25/2020.   Specialty: Family Medicine Why: @ 2pm Contact information: [place] [address] [phone]               30 Day Unplanned Readmission Risk Score   Flowsheet Row ED to Hosp-Admission (Discharged) from 06/21/2020 in [facility]  30 Day Unplanned Readmission Risk Score (%) 16.61 Filed at 06/23/2020 0801     This score is the patient's risk of an unplanned readmission within 30 days of being discharged (0 -100%). The score is based on dignosis, age, lab data, medications, orders, and past utilization.   Low:  0-14.9   Medium: 15-21.9   High: 22-29.9   Extreme: 30 and above         Management plans discussed with the patient, family and they are in agreement.  CODE STATUS: Prior   TOTAL TIME TAKING CARE OF THIS PATIENT: 45 minutes.    Max Sane M.D on 06/25/2020 at 7:58 PM  [facility]   CC: Primary care physician; [name], MD   Note: This dictation was prepared with Dragon dictation along with smaller phrase technology. Any transcriptional errors that result from this process are unintentional.

## 2020-07-11 ENCOUNTER — Other Ambulatory Visit: Payer: Self-pay

## 2020-07-11 ENCOUNTER — Emergency Department
Admission: EM | Admit: 2020-07-11 | Discharge: 2020-07-11 | Disposition: A | Discharge status: Home / Self Care | Payer: BC Managed Care – PPO | Attending: Emergency Medicine | Admitting: Emergency Medicine

## 2020-07-11 ENCOUNTER — Encounter: Payer: Self-pay | Admitting: Emergency Medicine

## 2020-07-11 DIAGNOSIS — Z7901 Long term (current) use of anticoagulants: Secondary | ICD-10-CM | POA: Diagnosis not present

## 2020-07-11 DIAGNOSIS — R220 Localized swelling, mass and lump, head: Secondary | ICD-10-CM | POA: Diagnosis not present

## 2020-07-11 DIAGNOSIS — T783XXA Angioneurotic edema, initial encounter: Secondary | ICD-10-CM | POA: Insufficient documentation

## 2020-07-11 DIAGNOSIS — I4891 Unspecified atrial fibrillation: Secondary | ICD-10-CM | POA: Diagnosis not present

## 2020-07-11 DIAGNOSIS — F17210 Nicotine dependence, cigarettes, uncomplicated: Secondary | ICD-10-CM | POA: Insufficient documentation

## 2020-07-11 DIAGNOSIS — Z79899 Other long term (current) drug therapy: Secondary | ICD-10-CM | POA: Diagnosis not present

## 2020-07-11 DIAGNOSIS — I10 Essential (primary) hypertension: Secondary | ICD-10-CM | POA: Diagnosis not present

## 2020-07-11 DIAGNOSIS — J449 Chronic obstructive pulmonary disease, unspecified: Secondary | ICD-10-CM | POA: Insufficient documentation

## 2020-07-11 LAB — CBC WITH DIFFERENTIAL/PLATELET
Abs Immature Granulocytes: 0.05 10*3/uL (ref 0.00–0.07)
Basophils Absolute: 0 10*3/uL (ref 0.0–0.1)
Basophils Relative: 0 %
Eosinophils Absolute: 0.1 10*3/uL (ref 0.0–0.5)
Eosinophils Relative: 1 %
HCT: 37.9 % — ABNORMAL LOW (ref 39.0–52.0)
Hemoglobin: 12.2 g/dL — ABNORMAL LOW (ref 13.0–17.0)
Immature Granulocytes: 1 %
Lymphocytes Relative: 21 %
Lymphs Abs: 1.4 10*3/uL (ref 0.7–4.0)
MCH: 32.8 pg (ref 26.0–34.0)
MCHC: 32.2 g/dL (ref 30.0–36.0)
MCV: 101.9 fL — ABNORMAL HIGH (ref 80.0–100.0)
Monocytes Absolute: 0.8 10*3/uL (ref 0.1–1.0)
Monocytes Relative: 12 %
Neutro Abs: 4.2 10*3/uL (ref 1.7–7.7)
Neutrophils Relative %: 65 %
Platelets: 271 10*3/uL (ref 150–400)
RBC: 3.72 MIL/uL — ABNORMAL LOW (ref 4.22–5.81)
RDW: 11.9 % (ref 11.5–15.5)
WBC: 6.4 10*3/uL (ref 4.0–10.5)
nRBC: 0 % (ref 0.0–0.2)

## 2020-07-11 LAB — BASIC METABOLIC PANEL
Anion gap: 8 (ref 5–15)
BUN: 35 mg/dL — ABNORMAL HIGH (ref 8–23)
CO2: 28 mmol/L (ref 22–32)
Calcium: 9.2 mg/dL (ref 8.9–10.3)
Chloride: 100 mmol/L (ref 98–111)
Creatinine, Ser: 1.66 mg/dL — ABNORMAL HIGH (ref 0.61–1.24)
GFR, Estimated: 42 mL/min — ABNORMAL LOW (ref >60)
Glucose, Bld: 99 mg/dL (ref 70–99)
Potassium: 4.3 mmol/L (ref 3.5–5.1)
Sodium: 136 mmol/L (ref 135–145)

## 2020-07-11 LAB — TROPONIN I (HIGH SENSITIVITY): Troponin I (High Sensitivity): 5 ng/L (ref <18)

## 2020-07-11 MED ORDER — FAMOTIDINE IN NACL 20-0.9 MG/50ML-% IV SOLN
20.0000 mg | Freq: Once | INTRAVENOUS | Status: AC
  Administered 2020-07-11: 20 mg via INTRAVENOUS
  Filled 2020-07-11: qty 50

## 2020-07-11 MED ORDER — TRANEXAMIC ACID-NACL 1000-0.7 MG/100ML-% IV SOLN
1000.0000 mg | Freq: Once | INTRAVENOUS | Status: AC
  Administered 2020-07-11: 1000 mg via INTRAVENOUS
  Filled 2020-07-11: qty 100

## 2020-07-11 MED ORDER — DIPHENHYDRAMINE HCL 50 MG/ML IJ SOLN
50.0000 mg | Freq: Once | INTRAMUSCULAR | Status: AC
  Administered 2020-07-11: 50 mg via INTRAVENOUS
  Filled 2020-07-11: qty 1

## 2020-07-11 MED ORDER — METHYLPREDNISOLONE SODIUM SUCC 125 MG IJ SOLR
125.0000 mg | Freq: Once | INTRAMUSCULAR | Status: AC
  Administered 2020-07-11: 125 mg via INTRAVENOUS
  Filled 2020-07-11: qty 2

## 2020-07-11 NOTE — ED Provider Notes (Signed)
[facility] Emergency Department Provider Note   ____________________________________________   Event Date/Time   First MD Initiated Contact with Patient 07/11/20 0121     (approximate)  I have reviewed the triage vital signs and the nursing notes.   HISTORY  Chief Complaint Oral Swelling    HPI Riley Martin is a 75 y.o. male with past medical history of hypertension, COPD, and atrial fibrillation on Eliquis who presents to the ED for oral swelling.  Patient reports that he started to notice swelling of the left side of his tongue as well as the floor of his mouth about 12 hours ago.  It has gradually worsened since then and has become very uncomfortable for him, making it difficult for him to breathe.  He states it has been hard for him to swallow, but he has not had any nausea or vomiting.  He denies any dental pain, fevers, or facial swelling.  He is concerned that the symptoms could be related to medications he was prescribed during a recent admission, when he was started on medication for atrial fibrillation.  He does take lisinopril for his hypertension.  He denies any rash, lightheadedness, vomiting, or diarrhea.        Past Medical History:  Diagnosis Date  . Arthritis   . COPD (chronic obstructive pulmonary disease) ([place])   . GERD (gastroesophageal reflux disease)   . Headache   . Orthostatic dizziness   . PONV (postoperative nausea and vomiting)   . Torn rotator cuff    left    Patient Active Problem List   Diagnosis Date Noted  . Atrial fibrillation with rapid ventricular response ([place]) 06/21/2020  . BPH (benign prostatic hyperplasia) 09/13/2016  . Spinal stenosis 09/13/2016  . Tobacco dependence 09/13/2016  . History of normocytic normochromic anemia 06/21/2016  . Vaccine counseling 06/21/2016  . Essential hypertriglyceridemia 04/02/2015  . History of adenomatous polyp of colon 02/05/2015  . History of gastroesophageal reflux  (GERD) 02/05/2015  . History of insomnia 02/05/2015  . Irritable bowel syndrome with constipation 02/05/2015    Past Surgical History:  Procedure Laterality Date  . COLONOSCOPY    . EXCISION CHONCHA BULLOSA Bilateral 06/02/2015   Procedure: BILATERAL CHONCHA BULLOSA;  Surgeon: Carloyn Manner, MD;  Location: [facility];  Service: ENT;  Laterality: Bilateral;  . MAXILLARY ANTROSTOMY Bilateral 06/02/2015   Procedure: MAXILLARY ANTROSTOMY;  Surgeon: Carloyn Manner, MD;  Location: [facility];  Service: ENT;  Laterality: Bilateral;  . NASAL POLYP SURGERY    . NECK SURGERY  2008   no limitations    Prior to Admission medications   Medication Sig Start Date End Date Taking? Authorizing Provider  ADVAIR DISKUS 250-50 MCG/DOSE AEPB INHALE ONE PUFF BY MOUTH TWICE A DAY. [place] MOUTH OUT AFTER USE 06/03/20   [provider]  apixaban (ELIQUIS) 5 MG TABS tablet Take 1 tablet (5 mg total) by mouth 2 (two) times daily. 06/23/20 07/23/20  Max Sane, MD  diltiazem (CARDIZEM CD) 180 MG 24 hr capsule Take 1 capsule (180 mg total) by mouth daily. 06/24/20 07/24/20  Max Sane, MD  fluticasone (FLONASE) 50 MCG/ACT nasal spray SPRAY 2 SPRAYS INTO EACH NOSTRIL EVERY DAY 02/07/17   Laverle Hobby, MD  LINZESS 290 MCG CAPS capsule TAKE 1 CAPSULE (290 MCG TOTAL) BY MOUTH ONCE DAILY 04/16/20   [provider]  lisinopril (ZESTRIL) 2.5 MG tablet Take 2.5 mg by mouth daily. 06/05/20   [provider]  oxyCODONE-acetaminophen ([place]) 13-244  MG tablet Take 1 tablet by mouth 5 (five) times daily as needed. 05/23/20   [provider]  OXYCONTIN 15 MG 12 hr tablet Take 15 mg by mouth every 8 (eight) hours. 05/23/20   [provider]  pantoprazole (PROTONIX) 20 MG tablet Take 20 mg by mouth daily. 06/15/20   [provider]  tamsulosin (FLOMAX) 0.4 MG CAPS capsule Take 0.4 mg by mouth daily. 09/03/16   [provider]  VENTOLIN HFA 108 (90  Base) MCG/ACT inhaler Inhale 2 puffs into the lungs every 6 (six) hours as needed. 08/07/16   [provider]    Allergies Tramadol, Ivp dye [iodinated diagnostic agents], and Pregabalin  No family history on file.  Social History Social History   Tobacco Use  . Smoking status: Current Every Day Smoker    Packs/day: 0.50    Years: 35.00    Pack years: 17.50    Types: Cigarettes  . Smokeless tobacco: Never Used  Vaping Use  . Vaping Use: Never used  Substance Use Topics  . Alcohol use: No  . Drug use: No    Review of Systems  Constitutional: No fever/chills Eyes: No visual changes. ENT: No sore throat.  Positive for tongue and mouth swelling. Cardiovascular: Denies chest pain. Respiratory: Denies shortness of breath. Gastrointestinal: No abdominal pain.  No nausea, no vomiting.  No diarrhea.  No constipation. Genitourinary: Negative for dysuria. Musculoskeletal: Negative for back pain. Skin: Negative for rash. Neurological: Negative for headaches, focal weakness or numbness.  ____________________________________________   PHYSICAL EXAM:  VITAL SIGNS: ED Triage Vitals  Enc Vitals Group     BP 07/11/20 0109 95/77     Pulse Rate 07/11/20 0109 (!) 110     Resp 07/11/20 0109 (!) 22     Temp 07/11/20 0109 99.9 F (37.7 C)     Temp Source 07/11/20 0109 Oral     SpO2 07/11/20 0109 99 %     Weight 07/11/20 0110 145 lb (65.8 kg)     Height 07/11/20 0110 6' (1.829 m)     Head Circumference --      Peak Flow --      Pain Score 07/11/20 0110 8     Pain Loc --      Pain Edu? --      Excl. in [place]? --     Constitutional: Alert and oriented. Eyes: Conjunctivae are normal. Head: Atraumatic. Nose: No congestion/rhinnorhea. Mouth/Throat: Mucous membranes are moist.  Unilateral left-sided tongue swelling with associated edema of the floor of mouth.  No posterior oropharyngeal edema noted. Neck: Normal ROM Cardiovascular: Normal rate, regular rhythm. Grossly normal  heart sounds. Respiratory: Normal respiratory effort.  No retractions. Lungs CTAB. Gastrointestinal: Soft and nontender. No distention. Genitourinary: deferred Musculoskeletal: No lower extremity tenderness nor edema. Neurologic:  Normal speech and language. No gross focal neurologic deficits are appreciated. Skin:  Skin is warm, dry and intact. No rash noted. Psychiatric: Mood and affect are normal. Speech and behavior are normal.  ____________________________________________   LABS (all labs ordered are listed, but only abnormal results are displayed)  Labs Reviewed  CBC WITH DIFFERENTIAL/PLATELET - Abnormal; Notable for the following components:      Result Value   RBC 3.72 (*)    Hemoglobin 12.2 (*)    HCT 37.9 (*)    MCV 101.9 (*)    All other components within normal limits  BASIC METABOLIC PANEL - Abnormal; Notable for the following components:   BUN 35 (*)  Creatinine, Ser 1.66 (*)    GFR, Estimated 42 (*)    All other components within normal limits  TROPONIN I (HIGH SENSITIVITY)   ____________________________________________  EKG  ED ECG REPORT I, Blake Divine, the attending physician, personally viewed and interpreted this ECG.   Date: 07/11/2020  EKG Time: 1:11  Rate: 100  Rhythm: normal sinus rhythm  Axis: Normal  Intervals:right bundle branch block  ST&T Change: None   PROCEDURES  Procedure(s) performed (including Critical Care):  Procedures   ____________________________________________   INITIAL IMPRESSION / ASSESSMENT AND PLAN / ED COURSE       75 year old male with past medical history of hypertension, COPD, and atrial fibrillation on Eliquis who presents to the ED with 12 hours of gradually worsening tongue and floor of mouth swelling.  He does not appear to have any posterior oropharyngeal swelling, does report some mild difficulty breathing but is not in any respiratory distress.  Presentation is consistent with angioedema and there  are no symptoms to suggest anaphylaxis.  This is likely ACE inhibitor induced angioedema as he currently takes lisinopril.  We will treat with Solu-Medrol, Benadryl, Pepcid, and tranexamic acid as some evidence suggests TXA can be beneficial in the setting of ACE inhibitor and induced angioedema.  We will monitor patient closely for worsening respiratory status.  Patient now complaining of some sharp pain in the center of his chest after being given Solu-Medrol.  EKG shows no evidence of arrhythmia or ischemia, we will add on troponin but low suspicion for ACS at this time.  Troponin is negative and patient states chest pain has resolved.  Tongue edema is also significantly improved after TXA and other medications.  He states his breathing is back to normal and he is no longer having any difficulty swallowing.  Given improving angioedema which has almost entirely resolved, patient is appropriate for discharge home with PCP follow-up.  He was reminded on multiple occasions that he should never again take lisinopril or other related medications.  He was counseled to return to the ED for new worsening symptoms, patient agrees with plan.      ____________________________________________   FINAL CLINICAL IMPRESSION(S) / ED DIAGNOSES  Final diagnoses:  Angioedema, initial encounter     ED Discharge Orders    None       Note:  This document was prepared using Dragon voice recognition software and may include unintentional dictation errors.   Blake Divine, MD 07/11/20 [phone]

## 2020-07-11 NOTE — ED Triage Notes (Signed)
Pt reports recently discharged from hospital with new medications reports he took all new medication yesterday Xarelto, prednisone, sulfamethoxazole, Breo Ellipta. Pt has hoarse voice able to talk in complete sentences left side of tongue swollen.

## 2020-07-11 NOTE — Discharge Instructions (Signed)
I am concerned that your tongue swelling today was due to your blood pressure medication, lisinopril.  You should stop taking this medication immediately and never take it again.  If you have worsening symptoms of swelling around your mouth or difficulty breathing, please return to the ER for reevaluation.  You should schedule follow-up with your PCP to discuss alternative blood pressure medications.

## 2020-12-09 DIAGNOSIS — R079 Chest pain, unspecified: Secondary | ICD-10-CM | POA: Insufficient documentation

## 2020-12-23 ENCOUNTER — Emergency Department: Payer: BC Managed Care – PPO

## 2020-12-23 ENCOUNTER — Inpatient Hospital Stay
Admission: EM | Admit: 2020-12-23 | Discharge: 2020-12-29 | DRG: 871 | Disposition: A | Discharge status: Home / Self Care | Payer: BC Managed Care – PPO | Attending: Internal Medicine | Admitting: Internal Medicine

## 2020-12-23 DIAGNOSIS — N184 Chronic kidney disease, stage 4 (severe): Secondary | ICD-10-CM | POA: Diagnosis present

## 2020-12-23 DIAGNOSIS — I480 Paroxysmal atrial fibrillation: Secondary | ICD-10-CM | POA: Diagnosis present

## 2020-12-23 DIAGNOSIS — G8929 Other chronic pain: Secondary | ICD-10-CM | POA: Diagnosis present

## 2020-12-23 DIAGNOSIS — Z91041 Radiographic dye allergy status: Secondary | ICD-10-CM

## 2020-12-23 DIAGNOSIS — J441 Chronic obstructive pulmonary disease with (acute) exacerbation: Secondary | ICD-10-CM | POA: Diagnosis present

## 2020-12-23 DIAGNOSIS — A4189 Other specified sepsis: Principal | ICD-10-CM | POA: Diagnosis present

## 2020-12-23 DIAGNOSIS — Z7901 Long term (current) use of anticoagulants: Secondary | ICD-10-CM | POA: Diagnosis not present

## 2020-12-23 DIAGNOSIS — E785 Hyperlipidemia, unspecified: Secondary | ICD-10-CM | POA: Diagnosis present

## 2020-12-23 DIAGNOSIS — J80 Acute respiratory distress syndrome: Secondary | ICD-10-CM | POA: Diagnosis present

## 2020-12-23 DIAGNOSIS — U071 COVID-19: Secondary | ICD-10-CM

## 2020-12-23 DIAGNOSIS — Z888 Allergy status to other drugs, medicaments and biological substances status: Secondary | ICD-10-CM | POA: Diagnosis not present

## 2020-12-23 DIAGNOSIS — M1990 Unspecified osteoarthritis, unspecified site: Secondary | ICD-10-CM | POA: Diagnosis present

## 2020-12-23 DIAGNOSIS — J9601 Acute respiratory failure with hypoxia: Secondary | ICD-10-CM | POA: Diagnosis not present

## 2020-12-23 DIAGNOSIS — N400 Enlarged prostate without lower urinary tract symptoms: Secondary | ICD-10-CM | POA: Diagnosis present

## 2020-12-23 DIAGNOSIS — F17210 Nicotine dependence, cigarettes, uncomplicated: Secondary | ICD-10-CM | POA: Diagnosis present

## 2020-12-23 DIAGNOSIS — Z79899 Other long term (current) drug therapy: Secondary | ICD-10-CM | POA: Diagnosis not present

## 2020-12-23 DIAGNOSIS — Z28310 Unvaccinated for covid-19: Secondary | ICD-10-CM | POA: Diagnosis not present

## 2020-12-23 DIAGNOSIS — Z885 Allergy status to narcotic agent status: Secondary | ICD-10-CM

## 2020-12-23 DIAGNOSIS — R0603 Acute respiratory distress: Secondary | ICD-10-CM | POA: Diagnosis not present

## 2020-12-23 DIAGNOSIS — J189 Pneumonia, unspecified organism: Secondary | ICD-10-CM

## 2020-12-23 DIAGNOSIS — R6520 Severe sepsis without septic shock: Secondary | ICD-10-CM | POA: Diagnosis present

## 2020-12-23 DIAGNOSIS — J1282 Pneumonia due to coronavirus disease 2019: Secondary | ICD-10-CM | POA: Diagnosis present

## 2020-12-23 DIAGNOSIS — K219 Gastro-esophageal reflux disease without esophagitis: Secondary | ICD-10-CM | POA: Diagnosis present

## 2020-12-23 DIAGNOSIS — R0602 Shortness of breath: Secondary | ICD-10-CM

## 2020-12-23 DIAGNOSIS — R112 Nausea with vomiting, unspecified: Secondary | ICD-10-CM

## 2020-12-23 HISTORY — DX: Other chronic pain: G89.29

## 2020-12-23 LAB — CBC WITH DIFFERENTIAL/PLATELET
Abs Immature Granulocytes: 0.07 10*3/uL (ref 0.00–0.07)
Basophils Absolute: 0 10*3/uL (ref 0.0–0.1)
Basophils Relative: 0 %
Eosinophils Absolute: 0 10*3/uL (ref 0.0–0.5)
Eosinophils Relative: 0 %
HCT: 36.9 % — ABNORMAL LOW (ref 39.0–52.0)
Hemoglobin: 12.4 g/dL — ABNORMAL LOW (ref 13.0–17.0)
Immature Granulocytes: 1 %
Lymphocytes Relative: 6 %
Lymphs Abs: 0.6 10*3/uL — ABNORMAL LOW (ref 0.7–4.0)
MCH: 33.5 pg (ref 26.0–34.0)
MCHC: 33.6 g/dL (ref 30.0–36.0)
MCV: 99.7 fL (ref 80.0–100.0)
Monocytes Absolute: 0.2 10*3/uL (ref 0.1–1.0)
Monocytes Relative: 2 %
Neutro Abs: 8.4 10*3/uL — ABNORMAL HIGH (ref 1.7–7.7)
Neutrophils Relative %: 91 %
Platelets: 185 10*3/uL (ref 150–400)
RBC: 3.7 MIL/uL — ABNORMAL LOW (ref 4.22–5.81)
RDW: 13.2 % (ref 11.5–15.5)
WBC: 9.3 10*3/uL (ref 4.0–10.5)
nRBC: 0 % (ref 0.0–0.2)

## 2020-12-23 LAB — BLOOD GAS, VENOUS
Acid-Base Excess: 0.2 mmol/L (ref 0.0–2.0)
Acid-Base Excess: 4.5 mmol/L — ABNORMAL HIGH (ref 0.0–2.0)
Bicarbonate: 26 mmol/L (ref 20.0–28.0)
Bicarbonate: 29.2 mmol/L — ABNORMAL HIGH (ref 20.0–28.0)
Delivery systems: POSITIVE
FIO2: 0.5
O2 Saturation: 22.1 %
O2 Saturation: 62.6 %
PEEP: 5 cmH2O
Patient temperature: 37
Patient temperature: 37
Pressure support: 5 cmH2O
pCO2, Ven: 46 mmHg (ref 44.0–60.0)
pCO2, Ven: 43 mmHg — ABNORMAL LOW (ref 44.0–60.0)
pH, Ven: 7.36 (ref 7.250–7.430)
pH, Ven: 7.44 — ABNORMAL HIGH (ref 7.250–7.430)
pO2, Ven: 31 mmHg — CL (ref 32.0–45.0)

## 2020-12-23 LAB — COMPREHENSIVE METABOLIC PANEL
ALT: 23 U/L (ref 0–44)
AST: 38 U/L (ref 15–41)
Albumin: 3.4 g/dL — ABNORMAL LOW (ref 3.5–5.0)
Alkaline Phosphatase: 35 U/L — ABNORMAL LOW (ref 38–126)
Anion gap: 9 (ref 5–15)
BUN: 47 mg/dL — ABNORMAL HIGH (ref 8–23)
CO2: 23 mmol/L (ref 22–32)
Calcium: 8.2 mg/dL — ABNORMAL LOW (ref 8.9–10.3)
Chloride: 101 mmol/L (ref 98–111)
Creatinine, Ser: 2.07 mg/dL — ABNORMAL HIGH (ref 0.61–1.24)
GFR, Estimated: 33 mL/min — ABNORMAL LOW (ref >60)
Glucose, Bld: 104 mg/dL — ABNORMAL HIGH (ref 70–99)
Potassium: 4.4 mmol/L (ref 3.5–5.1)
Sodium: 133 mmol/L — ABNORMAL LOW (ref 135–145)
Total Bilirubin: 0.7 mg/dL (ref 0.3–1.2)
Total Protein: 6.5 g/dL (ref 6.5–8.1)

## 2020-12-23 LAB — RESP PANEL BY RT-PCR (FLU A&B, COVID) ARPGX2
Influenza A by PCR: NEGATIVE
Influenza B by PCR: NEGATIVE
SARS Coronavirus 2 by RT PCR: POSITIVE — AB

## 2020-12-23 LAB — TROPONIN I (HIGH SENSITIVITY)
Troponin I (High Sensitivity): 21 ng/L — ABNORMAL HIGH (ref <18)
Troponin I (High Sensitivity): 20 ng/L — ABNORMAL HIGH (ref <18)

## 2020-12-23 LAB — PROCALCITONIN: Procalcitonin: 0.71 ng/mL

## 2020-12-23 LAB — CBG MONITORING, ED: Glucose-Capillary: 96 mg/dL (ref 70–99)

## 2020-12-23 LAB — GLUCOSE, CAPILLARY
Glucose-Capillary: 100 mg/dL — ABNORMAL HIGH (ref 70–99)
Glucose-Capillary: 151 mg/dL — ABNORMAL HIGH (ref 70–99)

## 2020-12-23 LAB — BRAIN NATRIURETIC PEPTIDE: B Natriuretic Peptide: 108 pg/mL — ABNORMAL HIGH (ref 0.0–100.0)

## 2020-12-23 LAB — MRSA NEXT GEN BY PCR, NASAL: MRSA by PCR Next Gen: NOT DETECTED

## 2020-12-23 MED ORDER — GUAIFENESIN-DM 100-10 MG/5ML PO SYRP: 10.0000 mL | ORAL_SOLUTION | ORAL | Status: DC | PRN

## 2020-12-23 MED ORDER — ALBUTEROL SULFATE HFA 108 (90 BASE) MCG/ACT IN AERS
2.0000 | INHALATION_SPRAY | Freq: Once | RESPIRATORY_TRACT | Status: DC
  Filled 2020-12-23: qty 6.7

## 2020-12-23 MED ORDER — IPRATROPIUM-ALBUTEROL 20-100 MCG/ACT IN AERS
1.0000 | INHALATION_SPRAY | Freq: Four times a day (QID) | RESPIRATORY_TRACT | Status: DC
  Administered 2020-12-24 (×2): 1 via RESPIRATORY_TRACT
  Filled 2020-12-23 (×3): qty 4

## 2020-12-23 MED ORDER — SODIUM CHLORIDE 0.9 % IV SOLN
1.0000 g | Freq: Once | INTRAVENOUS | Status: AC
  Administered 2020-12-23: 1 g via INTRAVENOUS
  Filled 2020-12-23: qty 10

## 2020-12-23 MED ORDER — METOPROLOL TARTRATE 5 MG/5ML IV SOLN
2.5000 mg | Freq: Once | INTRAVENOUS | Status: AC
  Administered 2020-12-23: 2.5 mg via INTRAVENOUS
  Filled 2020-12-23: qty 5

## 2020-12-23 MED ORDER — ORAL CARE MOUTH RINSE
15.0000 mL | Freq: Two times a day (BID) | OROMUCOSAL | Status: DC
  Administered 2020-12-24 – 2020-12-27 (×8): 15 mL via OROMUCOSAL

## 2020-12-23 MED ORDER — CHLORHEXIDINE GLUCONATE 0.12 % MT SOLN
15.0000 mL | Freq: Two times a day (BID) | OROMUCOSAL | Status: DC
  Administered 2020-12-23 – 2020-12-29 (×10): 15 mL via OROMUCOSAL
  Filled 2020-12-23 (×10): qty 15

## 2020-12-23 MED ORDER — SODIUM CHLORIDE 0.9 % IV SOLN
500.0000 mg | INTRAVENOUS | Status: AC
  Administered 2020-12-24 – 2020-12-25 (×2): 500 mg via INTRAVENOUS
  Filled 2020-12-23 (×2): qty 500

## 2020-12-23 MED ORDER — CHLORHEXIDINE GLUCONATE CLOTH 2 % EX PADS
6.0000 | MEDICATED_PAD | Freq: Every day | CUTANEOUS | Status: DC
  Administered 2020-12-23 – 2020-12-28 (×5): 6 via TOPICAL

## 2020-12-23 MED ORDER — ALBUTEROL SULFATE (2.5 MG/3ML) 0.083% IN NEBU: 3.0000 mL | INHALATION_SOLUTION | Freq: Four times a day (QID) | RESPIRATORY_TRACT | Status: DC | PRN

## 2020-12-23 MED ORDER — FLUTICASONE PROPIONATE 50 MCG/ACT NA SUSP
2.0000 | Freq: Every day | NASAL | Status: DC
  Administered 2020-12-24 – 2020-12-29 (×6): 2 via NASAL
  Filled 2020-12-23 (×2): qty 16

## 2020-12-23 MED ORDER — METOPROLOL TARTRATE 25 MG PO TABS
25.0000 mg | ORAL_TABLET | Freq: Two times a day (BID) | ORAL | Status: DC
  Administered 2020-12-24 – 2020-12-25 (×3): 25 mg via ORAL
  Filled 2020-12-23 (×4): qty 1

## 2020-12-23 MED ORDER — INSULIN ASPART 100 UNIT/ML IJ SOLN
0.0000 [IU] | INTRAMUSCULAR | Status: DC
  Administered 2020-12-23: 2 [IU] via SUBCUTANEOUS
  Filled 2020-12-23: qty 1

## 2020-12-23 MED ORDER — TAMSULOSIN HCL 0.4 MG PO CAPS
0.4000 mg | ORAL_CAPSULE | Freq: Every day | ORAL | Status: DC
  Administered 2020-12-24 – 2020-12-29 (×6): 0.4 mg via ORAL
  Filled 2020-12-23 (×6): qty 1

## 2020-12-23 MED ORDER — SODIUM CHLORIDE 0.9 % IV SOLN
200.0000 mg | Freq: Once | INTRAVENOUS | Status: AC
  Administered 2020-12-23: 200 mg via INTRAVENOUS
  Filled 2020-12-23: qty 40

## 2020-12-23 MED ORDER — ASCORBIC ACID 500 MG PO TABS
500.0000 mg | ORAL_TABLET | Freq: Every day | ORAL | Status: DC
  Administered 2020-12-23 – 2020-12-29 (×7): 500 mg via ORAL
  Filled 2020-12-23 (×7): qty 1

## 2020-12-23 MED ORDER — ACETAMINOPHEN 325 MG PO TABS
650.0000 mg | ORAL_TABLET | Freq: Four times a day (QID) | ORAL | Status: DC | PRN
  Administered 2020-12-24 – 2020-12-26 (×2): 650 mg via ORAL
  Filled 2020-12-23 (×2): qty 2

## 2020-12-23 MED ORDER — RIVAROXABAN 20 MG PO TABS
20.0000 mg | ORAL_TABLET | Freq: Every evening | ORAL | Status: DC
  Filled 2020-12-23: qty 1

## 2020-12-23 MED ORDER — POLYETHYLENE GLYCOL 3350 17 G PO PACK: 17.0000 g | PACK | Freq: Every day | ORAL | Status: DC | PRN

## 2020-12-23 MED ORDER — ONDANSETRON HCL 4 MG PO TABS: 4.0000 mg | ORAL_TABLET | Freq: Four times a day (QID) | ORAL | Status: DC | PRN

## 2020-12-23 MED ORDER — PANTOPRAZOLE SODIUM 40 MG IV SOLR: 40.0000 mg | Freq: Every day | INTRAVENOUS | Status: DC

## 2020-12-23 MED ORDER — LACTATED RINGERS IV BOLUS
1000.0000 mL | Freq: Once | INTRAVENOUS | Status: AC
  Administered 2020-12-23: 1000 mL via INTRAVENOUS

## 2020-12-23 MED ORDER — MIRTAZAPINE 15 MG PO TABS
15.0000 mg | ORAL_TABLET | Freq: Every day | ORAL | Status: DC
  Administered 2020-12-24 – 2020-12-28 (×5): 15 mg via ORAL
  Filled 2020-12-23 (×7): qty 1

## 2020-12-23 MED ORDER — SODIUM CHLORIDE 0.9 % IV SOLN
1.0000 g | INTRAVENOUS | Status: AC
  Administered 2020-12-24 – 2020-12-27 (×4): 1 g via INTRAVENOUS
  Filled 2020-12-23 (×4): qty 1

## 2020-12-23 MED ORDER — DEXAMETHASONE SODIUM PHOSPHATE 10 MG/ML IJ SOLN
6.0000 mg | INTRAMUSCULAR | Status: DC
  Filled 2020-12-23: qty 0.6

## 2020-12-23 MED ORDER — DEXAMETHASONE SODIUM PHOSPHATE 10 MG/ML IJ SOLN
10.0000 mg | Freq: Once | INTRAMUSCULAR | Status: AC
  Administered 2020-12-23: 10 mg via INTRAVENOUS
  Filled 2020-12-23: qty 1

## 2020-12-23 MED ORDER — DM-GUAIFENESIN ER 30-600 MG PO TB12
1.0000 | ORAL_TABLET | Freq: Two times a day (BID) | ORAL | Status: DC
  Administered 2020-12-24 – 2020-12-29 (×11): 1 via ORAL
  Filled 2020-12-23 (×11): qty 1

## 2020-12-23 MED ORDER — ONDANSETRON HCL 4 MG/2ML IJ SOLN
4.0000 mg | Freq: Four times a day (QID) | INTRAMUSCULAR | Status: DC | PRN
  Administered 2020-12-24 – 2020-12-29 (×12): 4 mg via INTRAVENOUS
  Filled 2020-12-23 (×12): qty 2

## 2020-12-23 MED ORDER — ZINC SULFATE 220 (50 ZN) MG PO CAPS
220.0000 mg | ORAL_CAPSULE | Freq: Every day | ORAL | Status: DC
  Administered 2020-12-23 – 2020-12-29 (×7): 220 mg via ORAL
  Filled 2020-12-23 (×7): qty 1

## 2020-12-23 MED ORDER — ENOXAPARIN SODIUM 40 MG/0.4ML IJ SOSY: 40.0000 mg | PREFILLED_SYRINGE | INTRAMUSCULAR | Status: DC

## 2020-12-23 MED ORDER — MORPHINE SULFATE (PF) 2 MG/ML IV SOLN
2.0000 mg | INTRAVENOUS | Status: DC | PRN
  Administered 2020-12-23: 2 mg via INTRAVENOUS
  Filled 2020-12-23: qty 1

## 2020-12-23 MED ORDER — TOCILIZUMAB 400 MG/20ML IV SOLN: 8.0000 mg/kg | Freq: Once | INTRAVENOUS | Status: DC

## 2020-12-23 MED ORDER — DEXAMETHASONE SODIUM PHOSPHATE 10 MG/ML IJ SOLN: 6.0000 mg | INTRAMUSCULAR | Status: DC

## 2020-12-23 MED ORDER — SODIUM CHLORIDE 0.9 % IV SOLN
100.0000 mg | Freq: Every day | INTRAVENOUS | Status: AC
  Administered 2020-12-24 – 2020-12-27 (×4): 100 mg via INTRAVENOUS
  Filled 2020-12-23 (×4): qty 100

## 2020-12-23 MED ORDER — PANTOPRAZOLE SODIUM 20 MG PO TBEC
20.0000 mg | DELAYED_RELEASE_TABLET | Freq: Every day | ORAL | Status: DC
  Administered 2020-12-24 – 2020-12-29 (×6): 20 mg via ORAL
  Filled 2020-12-23 (×9): qty 1

## 2020-12-23 MED ORDER — ENOXAPARIN SODIUM 30 MG/0.3ML IJ SOSY
30.0000 mg | PREFILLED_SYRINGE | INTRAMUSCULAR | Status: DC
  Administered 2020-12-23: 30 mg via SUBCUTANEOUS
  Filled 2020-12-23: qty 0.3

## 2020-12-23 MED ORDER — SODIUM CHLORIDE 0.9 % IV SOLN
500.0000 mg | Freq: Once | INTRAVENOUS | Status: AC
  Administered 2020-12-23: 500 mg via INTRAVENOUS
  Filled 2020-12-23: qty 500

## 2020-12-23 MED ORDER — INSULIN DETEMIR 100 UNIT/ML ~~LOC~~ SOLN
0.0750 [IU]/kg | Freq: Two times a day (BID) | SUBCUTANEOUS | Status: DC
  Administered 2020-12-23 – 2020-12-29 (×12): 5 [IU] via SUBCUTANEOUS
  Filled 2020-12-23 (×16): qty 0.05

## 2020-12-23 MED ORDER — HYDROCODONE-ACETAMINOPHEN 5-325 MG PO TABS
1.0000 | ORAL_TABLET | ORAL | Status: DC | PRN
  Administered 2020-12-24: 2 via ORAL
  Filled 2020-12-23: qty 2

## 2020-12-23 NOTE — ED Notes (Signed)
Pt given snacks to eat before food comes.

## 2020-12-23 NOTE — ED Notes (Signed)
Pt requested water due to dry mouth. Gave ice water to patient as approved by Dr. Charna Archer.

## 2020-12-23 NOTE — Progress Notes (Signed)
Transported patient to ICU, covering therapist aware, no distress noted. No changes at this time.

## 2020-12-23 NOTE — ED Notes (Signed)
Pt had accidentally removed an ECG lead- reattached. Patient reported he is feeling better and breathing more comfortably on BiPap and is tolerating well. Patient was saturating in high 90's however when talking began to desaturate to low 90's/high 80's.

## 2020-12-23 NOTE — Progress Notes (Signed)
GOALS OF CARE DISCUSSION  The Clinical status was relayed to family in detail. Wife over the phone Updated and notified of patients medical condition.  Patient with increased WOB and using accessory muscles to breathe On biPAP, alert and awake Explained to family course of therapy and the modalities  Resp distress due to [place] 19 infection Watch very closely High risk for intubation  Patient with Progressive multiorgan failure with a very high probablity of a very minimal chance of meaningful recovery despite all aggressive and optimal medical therapy.  PATIENT REMAINS FULL CODE  Family understands the situation.  Family are satisfied with Plan of action and management. All questions answered  Additional CC time 25 mins   Riley Martin Patricia Pesa, M.D.  [facility]  Medical Director [place] Director [facility] Cardio-Pulmonary Department

## 2020-12-23 NOTE — Consult Note (Signed)
Remdesivir - Pharmacy Brief Note   O:  ALT: 23 CXR: Bibasilar opacities which could represent an infectious inflammatory process. SpO2: 94% on 7L HFNC   A/P:  Remdesivir 200 mg IVPB once followed by 100 mg IVPB daily x 4 days.   Pearla Dubonnet, PharmD Clinical Pharmacist 12/23/2020 4:07 PM

## 2020-12-23 NOTE — ED Notes (Signed)
Report called to candace rn icu nurse

## 2020-12-23 NOTE — ED Notes (Signed)
Pt provided with phone to place order with dining services.

## 2020-12-23 NOTE — ED Provider Notes (Signed)
[facility] Emergency Department Provider Note   ____________________________________________   Event Date/Time   First MD Initiated Contact with Patient 12/23/20 1351     (approximate)  I have reviewed the triage vital signs and the nursing notes.   HISTORY  Chief Complaint Shortness of Breath (SOB worsening this am, has chest discomfort for 2 weeks)    HPI Riley Martin is a 75 y.o. male with past medical history of hyperlipidemia she reportedly, atrial fibrillation on Eliquis, CKD, and GERD who presents to the ED for shortness of breath.  Patient reports that he has been having increasing difficulty breathing for about the past 10 days.  He initially took a home test that was positive for COVID-19 on August 8, states his symptoms have been worsening since then.  He has been having fevers as high as 101.0 yesterday, weakness, malaise, cough, chest pain, shortness of breath, nausea, and diarrhea.  He was seen by his pulmonologist a couple of days ago due to concern for COPD exacerbation on top of his COVID-19, was prescribed steroids at that time.  He states his breathing has worsened despite that and he decided to call EMS this morning.  He reportedly had a room air O2 sat of 88% and was placed on 4 L nasal cannula with improvement.  Patient has not received the COVID-19 vaccine.        Past Medical History:  Diagnosis Date   Arthritis    COPD (chronic obstructive pulmonary disease) (HCC)    GERD (gastroesophageal reflux disease)    Headache    Orthostatic dizziness    PONV (postoperative nausea and vomiting)    Torn rotator cuff    left    Patient Active Problem List   Diagnosis Date Noted   Atrial fibrillation with rapid ventricular response ([place]) 06/21/2020   BPH (benign prostatic hyperplasia) 09/13/2016   Spinal stenosis 09/13/2016   Tobacco dependence 09/13/2016   History of normocytic normochromic anemia 06/21/2016   Vaccine  counseling 06/21/2016   Essential hypertriglyceridemia 04/02/2015   History of adenomatous polyp of colon 02/05/2015   History of gastroesophageal reflux (GERD) 02/05/2015   History of insomnia 02/05/2015   Irritable bowel syndrome with constipation 02/05/2015    Past Surgical History:  Procedure Laterality Date   COLONOSCOPY     EXCISION CHONCHA BULLOSA Bilateral 06/02/2015   Procedure: BILATERAL CHONCHA BULLOSA;  Surgeon: Carloyn Manner, MD;  Location: [facility] [facility];  Service: ENT;  Laterality: Bilateral;   MAXILLARY ANTROSTOMY Bilateral 06/02/2015   Procedure: MAXILLARY ANTROSTOMY;  Surgeon: Carloyn Manner, MD;  Location: [place];  Service: ENT;  Laterality: Bilateral;   [place]  2008   no limitations    Prior to Admission medications   Medication Sig Start Date End Date Taking? Authorizing Provider  ADVAIR DISKUS 250-50 MCG/DOSE AEPB INHALE ONE PUFF BY MOUTH TWICE A DAY. [place] MOUTH OUT AFTER USE 06/03/20   [provider]  apixaban (ELIQUIS) 5 MG TABS tablet Take 1 tablet (5 mg total) by mouth 2 (two) times daily. 06/23/20 07/23/20  Max Sane, MD  diltiazem (CARDIZEM CD) 180 MG 24 hr capsule Take 1 capsule (180 mg total) by mouth daily. 06/24/20 07/24/20  Max Sane, MD  fluticasone (FLONASE) 50 MCG/ACT nasal spray SPRAY 2 SPRAYS INTO EACH NOSTRIL EVERY DAY 02/07/17   Laverle Hobby, MD  LINZESS 290 MCG CAPS capsule TAKE 1 CAPSULE (290 MCG TOTAL) BY MOUTH ONCE DAILY  04/16/20   [provider]  lisinopril (ZESTRIL) 2.5 MG tablet Take 2.5 mg by mouth daily. 06/05/20   [provider]  oxyCODONE-acetaminophen (PERCOCET) 10-325 MG tablet Take 1 tablet by mouth 5 (five) times daily as needed. 05/23/20   [provider]  OXYCONTIN 15 MG 12 hr tablet Take 15 mg by mouth every 8 (eight) hours. 05/23/20   [provider]  pantoprazole (PROTONIX) 20 MG tablet Take 20 mg by mouth daily. 06/15/20    [provider]  tamsulosin (FLOMAX) 0.4 MG CAPS capsule Take 0.4 mg by mouth daily. 09/03/16   [provider]  VENTOLIN HFA 108 (90 Base) MCG/ACT inhaler Inhale 2 puffs into the lungs every 6 (six) hours as needed. 08/07/16   [provider]    Allergies Tramadol, Ivp dye [iodinated diagnostic agents], and Pregabalin  No family history on file.  Social History Social History   Tobacco Use   Smoking status: Every Day    Packs/day: 0.50    Years: 35.00    Pack years: 17.50    Types: Cigarettes   Smokeless tobacco: Never  Vaping Use   Vaping Use: Never used  Substance Use Topics   Alcohol use: No   Drug use: No    Review of Systems  Constitutional: Positive for fever/chills Eyes: No visual changes. ENT: No sore throat. Cardiovascular: Positive for chest pain. Respiratory: Positive for cough and shortness of breath. Gastrointestinal: No abdominal pain.  Positive for nausea, no vomiting.  Positive for diarrhea.  No constipation. Genitourinary: Negative for dysuria. Musculoskeletal: Negative for back pain. Skin: Negative for rash. Neurological: Negative for headaches, focal weakness or numbness.  ____________________________________________   PHYSICAL EXAM:  VITAL SIGNS: ED Triage Vitals  Enc Vitals Group     BP      Pulse      Resp      Temp      Temp src      SpO2      Weight      Height      Head Circumference      Peak Flow      Pain Score      Pain Loc      Pain Edu?      Excl. in [place]?     Constitutional: Alert and oriented. Eyes: Conjunctivae are normal. Head: Atraumatic. Nose: No congestion/rhinnorhea. Mouth/Throat: Mucous membranes are moist. Neck: Normal ROM Cardiovascular: Normal rate, regular rhythm. Grossly normal heart sounds.  2+ radial pulses bilaterally. Respiratory: Tachypneic with increased respiratory effort.  No retractions. Lungs with very mild expiratory wheezing. Gastrointestinal: Soft and nontender. No  distention. Genitourinary: deferred Musculoskeletal: No lower extremity tenderness nor edema. Neurologic:  Normal speech and language. No gross focal neurologic deficits are appreciated. Skin:  Skin is warm, dry and intact. No rash noted. Psychiatric: Mood and affect are normal. Speech and behavior are normal.  ____________________________________________   LABS (all labs ordered are listed, but only abnormal results are displayed)  Labs Reviewed  CBC WITH DIFFERENTIAL/PLATELET - Abnormal; Notable for the following components:      Result Value   RBC 3.70 (*)    Hemoglobin 12.4 (*)    HCT 36.9 (*)    Neutro Abs 8.4 (*)    Lymphs Abs 0.6 (*)    All other components within normal limits  COMPREHENSIVE METABOLIC PANEL - Abnormal; Notable for the following components:   Sodium 133 (*)    Glucose, Bld 104 (*)  BUN 47 (*)    Creatinine, Ser 2.07 (*)    Calcium 8.2 (*)    Albumin 3.4 (*)    Alkaline Phosphatase 35 (*)    GFR, Estimated 33 (*)    All other components within normal limits  TROPONIN I (HIGH SENSITIVITY) - Abnormal; Notable for the following components:   Troponin I (High Sensitivity) 21 (*)    All other components within normal limits  RESP PANEL BY RT-PCR (FLU A&B, COVID) ARPGX2  PROCALCITONIN  BRAIN NATRIURETIC PEPTIDE   ____________________________________________  EKG  ED ECG REPORT I, Blake Divine, the attending physician, personally viewed and interpreted this ECG.   Date: 12/23/2020  EKG Time: 13:53  Rate: 102  Rhythm: sinus tachycardia  Axis: RAD  Intervals: Incomplete RBBB  ST&T Change: None   PROCEDURES  Procedure(s) performed (including Critical Care):  .Critical Care  Date/Time: 12/23/2020 2:13 PM Performed by: Blake Divine, MD Authorized by: Blake Divine, MD   Critical care provider statement:    Critical care time (minutes):  45   Critical care time was exclusive of:  Separately billable procedures and treating other  patients and teaching time   Critical care was necessary to treat or prevent imminent or life-threatening deterioration of the following conditions:  Respiratory failure   Critical care was time spent personally by me on the following activities:  Discussions with consultants, evaluation of patient's response to treatment, examination of patient, ordering and performing treatments and interventions, ordering and review of laboratory studies, ordering and review of radiographic studies, pulse oximetry, re-evaluation of patient's condition, obtaining history from patient or surrogate and review of old charts   I assumed direction of critical care for this patient from another provider in my specialty: no     Care discussed with: admitting provider     ____________________________________________   INITIAL IMPRESSION / ASSESSMENT AND PLAN / ED COURSE      75 year old male with past medical history of hyperlipidemia, COPD, CKD, GERD, and atrial fibrillation on Eliquis who presents to the ED with increasing difficulty breathing after taking a home test that was positive for COVID-19 10 days ago.  He is tachypneic with increased respiratory effort, noted to have O2 sats of 88% on 4 L nasal cannula that subsequently improved with 6 L nasal cannula.  I suspect his respiratory failure is due to COVID-19, especially given he is unvaccinated.  EKG shows no evidence of arrhythmia or ischemia, troponin is pending.  Chest x-ray reviewed by me and shows questionable infiltrate at his right lower lobe, procalcitonin is pending to ensure no component of bacterial pneumonia.  We will treat with dose of IV Decadron for COVID-19 and mild COPD exacerbation.  Low suspicion for PE at this time.  Chest x-ray read by radiology as developing infiltrate, patient also noted to have mildly elevated procalcitonin.  Given these findings, I am concerned patient has developed bacterial pneumonia on top of his COVID-19 infection, we  will cover with Rocephin and azithromycin.  Remainder of labs are remarkable for mild AKI and we will hydrate with IV fluids.  Patient remained stable on 6 L nasal cannula, plan to discuss with hospitalist for admission.      ____________________________________________   FINAL CLINICAL IMPRESSION(S) / ED DIAGNOSES  Final diagnoses:  Acute respiratory failure with hypoxia (HCC)  COPD exacerbation ([place])  COVID-19  Community acquired pneumonia, unspecified laterality     ED Discharge Orders     None  Note:  This document was prepared using Dragon voice recognition software and may include unintentional dictation errors.    Blake Divine, MD 12/23/20 1451

## 2020-12-23 NOTE — ED Notes (Signed)
RT at bedside setting up bipap

## 2020-12-23 NOTE — ED Notes (Signed)
Albuterol inhaler not available yet from pharmacy.

## 2020-12-23 NOTE — ED Notes (Signed)
Attempted to place pt on humidified oxygen 6L [place]. Spewing water everywhere. Called RT to come help.

## 2020-12-23 NOTE — ED Notes (Signed)
Resumed care from reina rn.  Pt sleeping  bipap in place   nsr on monitor with occ pvc.

## 2020-12-23 NOTE — ED Notes (Signed)
Noticed patient desaturating on monitor at nurses's station. Checked on pt- having tripod breathing, severe coughing fit and he had rapidly desaturated to 78%. During coughing he was saturating in the low 80's. After recovering from coughing spell, pt. Saturated up to approximately 95%. Pt is on 10L/min nasal cannula. Provider messaged.

## 2020-12-23 NOTE — ED Notes (Signed)
Lab called to report pt is Covid positive. Will notify DVM

## 2020-12-23 NOTE — ED Notes (Signed)
Spoke with Dr Rowe Pavy re: a cough suppressant and also re: holding PO meds for now. Will hold PO meds for now. Pt on Bipap.

## 2020-12-23 NOTE — ED Notes (Addendum)
RT came to bedside and placed pt on 7L [place] humidified oxygen.  Pt appears quite SOB. Has desaturated to 88% when not concentrating on breathing through nose.  VBG drawn and handed to RT. Currently 97% SPO2.

## 2020-12-23 NOTE — Consult Note (Signed)
NAME:  Riley Martin, MRN:  [id number], DOB:  10/13/1945, LOS: 0 ADMISSION DATE:  12/23/2020 CONSULTATION DATE:  12/23/2020  REFERRING MD:  Rowe Pavy CHIEF COMPLAINT:  COVID 19 infection  BRIEF SYNOPSIS 75 yo WM with severe COVID 190 infection and pneumonia  History of Present Illness:  75 y.o. male with past medical history of hyperlipidemia h/o trial fibrillation on Eliquis, CKD, and GERD  who presents to the ED for shortness of breath.    Patient reports that he has been having increasing difficulty breathing for about the past 10 days.   He initially took a home test that was positive for COVID-19 on August 8, states his symptoms have been worsening since then.    He has been having fevers as high as 101.0 yesterday, weakness, malaise, cough, chest pain, shortness of breath, nausea, and diarrhea.  He was seen by his pulmonologist a couple of days ago due to concern for COPD exacerbation on top of his COVID-19  he states his breathing has worsened despite that and he decided to call EMS this morning.   He reportedly had a room air O2 sat of 88% and was placed on 4 L nasal cannula with improvement.   Patient has not received the COVID-19 vaccine.  Patient placed on bIPAP for severe SOB  Pertinent  Medical History  COPD  Significant Hospital Events: Including procedures, antibiotic start and stop dates in addition to other pertinent events   8/17 admitted for severe COVID 19 pneumonia      Micro Data:  8/8 COVID 19 infection + home test  Antimicrobials:   Antibiotics Given (last 72 hours)     Date/Time Action Medication Dose Rate   12/23/20 1505 New Bag/Given   cefTRIAXone (ROCEPHIN) 1 g in sodium chloride 0.9 % 100 mL IVPB 1 g 200 mL/hr   12/23/20 1514 New Bag/Given   azithromycin (ZITHROMAX) 500 mg in sodium chloride 0.9 % 250 mL IVPB 500 mg 250 mL/hr   12/23/20 1732 New Bag/Given  [was not availablke from pharmacy until now.]   remdesivir 200 mg in sodium  chloride 0.9% 250 mL IVPB 200 mg 580 mL/hr             Objective   Blood pressure 101/67, pulse 100, temperature 99.2 F (37.3 C), temperature source Oral, resp. rate (!) 23, height 6' (1.829 m), weight 68.9 kg, SpO2 100 %.        Intake/Output Summary (Last 24 hours) at 12/23/2020 1753 Last data filed at 12/23/2020 1738 Gross per 24 hour  Intake 1000 ml  Output --  Net 1000 ml   Filed Weights   12/23/20 1355  Weight: 68.9 kg     Review of Systems: +fatigue +SOB +WOB +fevers +diarrhea Other:  All other systems negative    PHYSICAL EXAMINATION:  GENERAL:critically ill appearing, +resp distress on biPAP EYES: Pupils equal, round, reactive to light.  No scleral icterus.  MOUTH: Moist mucosal membrane. NECK: Supple.  PULMONARY: +rhonchi, +wheezing CARDIOVASCULAR: S1 and S2.  No murmurs  GASTROINTESTINAL: Soft, nontender, -distended. Positive bowel sounds.  MUSCULOSKELETAL: No swelling, clubbing, or edema.  NEUROLOGIC: alert and awake SKIN:intact,warm,dry       Labs/imaging that I havepersonally reviewed  (right click and "Reselect all SmartList Selections" daily)       ASSESSMENT AND PLAN SYNOPSIS  75 yo with severe COVID 19 infection and pneumonia with severe COPD exacerbation  Severe ACUTE Hypoxic and Hypercapnic Respiratory Failure BIPAP as needed  Severe COVID-19 infection,  ARDS and pneumonia/pneumonitis Continue IV steroids  Continue IV remdisivir Aggressive pulm toilet recommended OOB to chair as tolerated  Pulmonary hygiene Maintain airborne and contact precautions  Vitamin C and zinc Antitussives High risk for intubation    SEVERE COPD EXACERBATION -continue IV steroids as prescribed -wean fio2 as needed and tolerated  CARDIAC ICU monitoring   SEVERE SEPSIS DUE TO COVID SOURCE-LUNG  COVID 19  INFECTIOUS DISEASE -continue antibiotics as prescribed -follow up cultures  ENDO - ICU hypoglycemic\Hyperglycemia  protocol -check FSBS per protocol   GI GI PROPHYLAXIS as indicated  NUTRITIONAL STATUS DIET-->NPO Constipation protocol as indicated   ELECTROLYTES -follow labs as needed -replace as needed -pharmacy consultation and following   CHECK ECHO  Best practice (right click and "Reselect all SmartList Selections" daily)  Diet: NPO Mobility:  bed rest  Code Status:  FULL Disposition:ICU  Labs   CBC: Recent Labs  Lab 12/23/20 1347  WBC 9.3  NEUTROABS 8.4*  HGB 12.4*  HCT 36.9*  MCV 99.7  PLT 277    Basic Metabolic Panel: Recent Labs  Lab 12/23/20 1347  NA 133*  K 4.4  CL 101  CO2 23  GLUCOSE 104*  BUN 47*  CREATININE 2.07*  CALCIUM 8.2*   GFR: Estimated Creatinine Clearance: 29.6 mL/min (A) (by C-G formula based on SCr of 2.07 mg/dL (H)). Recent Labs  Lab 12/23/20 1347  PROCALCITON 0.71  WBC 9.3    Liver Function Tests: Recent Labs  Lab 12/23/20 1347  AST 38  ALT 23  ALKPHOS 35*  BILITOT 0.7  PROT 6.5  ALBUMIN 3.4*   No results for input(s): LIPASE, AMYLASE in the last 168 hours. No results for input(s): AMMONIA in the last 168 hours.  ABG    Component Value Date/Time   HCO3 26.0 12/23/2020 1517   O2SAT 22.1 12/23/2020 1517     Coagulation Profile: No results for input(s): INR, PROTIME in the last 168 hours.  Cardiac Enzymes: No results for input(s): CKTOTAL, CKMB, CKMBINDEX, TROPONINI in the last 168 hours.  HbA1C: No results found for: HGBA1C  CBG: Recent Labs  Lab 12/23/20 1633  GLUCAP 96     Past Medical History:  He,  has a past medical history of Arthritis, COPD (chronic obstructive pulmonary disease) ([place]), GERD (gastroesophageal reflux disease), Headache, Orthostatic dizziness, PONV (postoperative nausea and vomiting), and Torn rotator cuff.   Surgical History:   Past Surgical History:  Procedure Laterality Date   COLONOSCOPY     EXCISION CHONCHA BULLOSA Bilateral 06/02/2015   Procedure: BILATERAL CHONCHA  BULLOSA;  Surgeon: Carloyn Manner, MD;  Location: [facility];  Service: ENT;  Laterality: Bilateral;   MAXILLARY ANTROSTOMY Bilateral 06/02/2015   Procedure: MAXILLARY ANTROSTOMY;  Surgeon: Carloyn Manner, MD;  Location: [place];  Service: ENT;  Laterality: Bilateral;   [place]  2008   no limitations     Social History:   reports that he has been smoking cigarettes. He has a 17.50 pack-year smoking history. He has never used smokeless tobacco. He reports that he does not drink alcohol and does not use drugs.   Family History:  His family history is not on file.   Allergies Allergies  Allergen Reactions   Iodinated Diagnostic Agents Other (See Comments) and Nausea Only    Dry heaves, patient states he felt like he was on fire and about to pass out.  Pre-syncope, burning  Dry heaves, patient states he felt like he  was on fire and about to pass out.    Lisinopril Swelling    Tongue swelling, neck pain and Headaches    Tramadol Other (See Comments) and Shortness Of Breath    Other reaction(s): Other (See Comments) Other Reaction: tachy Dizziness and unsteady gait.   Pregabalin Palpitations    Other reaction(s): Other (See Comments) Other Reaction: edema     Home Medications  Prior to Admission medications   Medication Sig Start Date End Date Taking? Authorizing Provider  albuterol (PROVENTIL) (2.5 MG/3ML) 0.083% nebulizer solution Take 3 mLs by nebulization every 6 (six) hours as needed for wheezing or shortness of breath. 12/13/20  Yes [provider]  fluticasone (FLONASE) 50 MCG/ACT nasal spray SPRAY 2 SPRAYS INTO EACH NOSTRIL EVERY DAY 02/07/17  Yes Laverle Hobby, MD  metoprolol tartrate (LOPRESSOR) 25 MG tablet Take 25 mg by mouth 2 (two) times daily. 12/15/20  Yes [provider]  oxyCODONE-acetaminophen (PERCOCET) 10-325 MG tablet Take 1 tablet by mouth 5 (five) times daily as needed. 05/23/20  Yes  [provider]  OXYCONTIN 15 MG 12 hr tablet Take 15 mg by mouth every 8 (eight) hours. 05/23/20  Yes [provider]  pantoprazole (PROTONIX) 20 MG tablet Take 20 mg by mouth daily. 06/15/20  Yes [provider]  tamsulosin (FLOMAX) 0.4 MG CAPS capsule Take 0.4 mg by mouth daily. 09/03/16  Yes [provider]  VENTOLIN HFA 108 (90 Base) MCG/ACT inhaler Inhale 2 puffs into the lungs every 6 (six) hours as needed. 08/07/16  Yes [provider]  XARELTO 20 MG TABS tablet Take 20 mg by mouth daily. 10/08/20  Yes [provider]  megestrol (MEGACE ES) 625 MG/5ML suspension Take 625 mg by mouth daily. 08/25/20   [provider]  mirtazapine (REMERON) 15 MG tablet Take 15 mg by mouth at bedtime. 12/14/20   [provider]  OXYCONTIN 10 MG 12 hr tablet Take 10 mg by mouth 3 (three) times daily. 12/22/20   [provider]       DVT/GI PRX  assessed I Assessed the need for Labs I Assessed the need for Foley I Assessed the need for Central Venous Line Family Discussion when available I Assessed the need for Mobilization I made an Assessment of medications to be adjusted accordingly Safety Risk assessment completed  CASE DISCUSSED IN MULTIDISCIPLINARY ROUNDS WITH ICU TEAM   Critical Care Time devoted to patient care services described in this note is 65  minutes.   Critical care was necessary to treat /prevent imminent and life-threatening deterioration.  I ANTICIPATE PROLONGED HOSPITAL LOS   Charmion Hapke Patricia Pesa, M.D.  [facility]  Medical Director [place] Director [facility] Cardio-Pulmonary Department

## 2020-12-23 NOTE — ED Notes (Signed)
Called RT to come look at pt and consider upgrading oxygen delivery device. Dr Rowe Pavy agrees.

## 2020-12-23 NOTE — ED Notes (Signed)
CBG 96. 

## 2020-12-23 NOTE — ED Notes (Signed)
Spoke with Dr Rowe Pavy about pt. Pt is sitting in tripod position and appears SOB although VS have been mostly normal. Pt desatting to 87% on 7L. Spoke with RT, turned pt up to 10L. Pt currently 94% on 10L.

## 2020-12-23 NOTE — ED Notes (Signed)
Contacted Dr Rowe Pavy regarding pt status. Pt desaturates rapidly with coughing. Pt became irritable when nurses entered room to check on him when he had desaturated to 87%. Explained to pt that ER nurse job is to keep pt safe and oxygenation status is more important than having privacy while urinating. Pt currently 98% on 10L HFNC.

## 2020-12-23 NOTE — ED Triage Notes (Addendum)
Pt presents via EMS for SOB this am. Patient has had discomfort breathing for 2 week and coughing. Tested covid positive 12/13/2020. No covid vax hx. Pt was 88% on RA with EMS and increased to 90/91 with 4L/min nasal cannula. Hx COPD and is not on O2 at home. Pt went to [facility] 12/21/20 for SOB. BP 133/95, RR 22, EtCO2 high 20's. Pt was receiving nebulizer tx on presentation. Patient original SPO2 88% at 4L/min when connected at bedside, increased to 6L/min via nasal cannula and SPO2 increased to SPO2 at 92%

## 2020-12-23 NOTE — ED Notes (Signed)
Pulse ox site changed to ear. Better pleth. 100% on 10L humidified [place].

## 2020-12-23 NOTE — H&P (Signed)
History and Physical    Riley Martin HBZ:[id number] DOB: [birth year] DOA: 12/23/2020  PCP: [name], MD  Chief Complaint: Shortness of breath  HPI: Riley Martin is a 75 y.o. male with a past medical history of COPD not on home oxygen, hyperlipidemia, A. fib on Xarelto, chronic kidney disease, GERD, former smoker.  The patient presents to the emergency department due to shortness of breath that has been going on for about a week and a half now.  He tested positive for COVID-19 at home on August 8.  He states he was exposed with some of his church friends who have also tested positive.  He is unfortunately unvaccinated.  His symptoms have been worsening since then with persistent coughing and pleuritic chest pain as well as sore throat and headaches.  He is also experiencing some body aches and nausea.  No pain or diarrhea.  Had a fever of 101 F yesterday at home.  Upon EMS arrival to his house he was saturating 88% on room air.  Was put on 4 L of oxygen supplementation and SPO2 increased to 90-91.  Upon arrival to the emergency department he was noted to be 88% on 4 L.  He was subsequently increased to 6 L/min.  While in the emergency department if he can coughing and several episodes of desaturations.  Eventually titrated up from high flow nasal cannula to now being on BiPAP.  Currently on FiO2 40%.  PCCM aware and following in consultation.  He is not encephalopathic.  ED Course: Albuterol 2 puffs x 1, Zithromax 500 mg IV x1, Rocephin 1 g IV x1, dexamethasone 10 mg IV x1, lactated Ringer 1 L bolus; CMP, BNP, troponins, CBC, procalcitonin, VBG.  EKG and chest x-ray.  Review of Systems: 14 point review of systems is negative except for what is mentioned above in the HPI.   Past Medical History:  Diagnosis Date   Arthritis    COPD (chronic obstructive pulmonary disease) (HCC)    GERD (gastroesophageal reflux disease)    Headache    Orthostatic dizziness    PONV  (postoperative nausea and vomiting)    Torn rotator cuff    left    Past Surgical History:  Procedure Laterality Date   COLONOSCOPY     EXCISION CHONCHA BULLOSA Bilateral 06/02/2015   Procedure: BILATERAL CHONCHA BULLOSA;  Surgeon: Carloyn Manner, MD;  Location: [place];  Service: ENT;  Laterality: Bilateral;   MAXILLARY ANTROSTOMY Bilateral 06/02/2015   Procedure: MAXILLARY ANTROSTOMY;  Surgeon: Carloyn Manner, MD;  Location: [facility];  Service: ENT;  Laterality: Bilateral;   [place]  2008   no limitations    Social History   Socioeconomic History   Marital status: Married    Spouse name: Not on file   Number of children: Not on file   Years of education: Not on file   Highest education level: Not on file  Occupational History   Not on file  Tobacco Use   Smoking status: Every Day    Packs/day: 0.50    Years: 35.00    Pack years: 17.50    Types: Cigarettes   Smokeless tobacco: Never  Vaping Use   Vaping Use: Never used  Substance and Sexual Activity   Alcohol use: No   Drug use: No   Sexual activity: Not on file  Other Topics Concern   Not on file  Social History Narrative   Not on  file   Social Determinants of Health   Financial Resource Strain: Not on file  Food Insecurity: Not on file  Transportation Needs: Not on file  Physical Activity: Not on file  Stress: Not on file  Social Connections: Not on file  Intimate Partner Violence: Not on file    Allergies  Allergen Reactions   Iodinated Diagnostic Agents Other (See Comments) and Nausea Only    Dry heaves, patient states he felt like he was on fire and about to pass out.  Pre-syncope, burning  Dry heaves, patient states he felt like he was on fire and about to pass out.    Lisinopril Swelling    Tongue swelling, neck pain and Headaches    Tramadol Other (See Comments) and Shortness Of Breath    Other reaction(s): Other (See Comments) Other  Reaction: tachy Dizziness and unsteady gait.   Pregabalin Palpitations    Other reaction(s): Other (See Comments) Other Reaction: edema    No family history on file.  Prior to Admission medications   Medication Sig Start Date End Date Taking? Authorizing Provider  albuterol (PROVENTIL) (2.5 MG/3ML) 0.083% nebulizer solution Take 3 mLs by nebulization every 6 (six) hours as needed for wheezing or shortness of breath. 12/13/20  Yes [provider]  fluticasone (FLONASE) 50 MCG/ACT nasal spray SPRAY 2 SPRAYS INTO EACH NOSTRIL EVERY DAY 02/07/17  Yes Laverle Hobby, MD  metoprolol tartrate (LOPRESSOR) 25 MG tablet Take 25 mg by mouth 2 (two) times daily. 12/15/20  Yes [provider]  oxyCODONE-acetaminophen (PERCOCET) 10-325 MG tablet Take 1 tablet by mouth 5 (five) times daily as needed. 05/23/20  Yes [provider]  OXYCONTIN 15 MG 12 hr tablet Take 15 mg by mouth every 8 (eight) hours. 05/23/20  Yes [provider]  pantoprazole (PROTONIX) 20 MG tablet Take 20 mg by mouth daily. 06/15/20  Yes [provider]  tamsulosin (FLOMAX) 0.4 MG CAPS capsule Take 0.4 mg by mouth daily. 09/03/16  Yes [provider]  VENTOLIN HFA 108 (90 Base) MCG/ACT inhaler Inhale 2 puffs into the lungs every 6 (six) hours as needed. 08/07/16  Yes [provider]  XARELTO 20 MG TABS tablet Take 20 mg by mouth daily. 10/08/20  Yes [provider]  megestrol (MEGACE ES) 625 MG/5ML suspension Take 625 mg by mouth daily. 08/25/20   [provider]  mirtazapine (REMERON) 15 MG tablet Take 15 mg by mouth at bedtime. 12/14/20   [provider]  OXYCONTIN 10 MG 12 hr tablet Take 10 mg by mouth 3 (three) times daily. 12/22/20   [provider]    Physical Exam: Vitals:   12/23/20 1845 12/23/20 1900 12/23/20 1915 12/23/20 1930  BP:  (!) 148/69  (!) 152/68  Pulse: 94 87 89 92  Resp: (!) 23 17 17 18   Temp:      TempSrc:      SpO2:  100% 100% 100% 100%  Weight:      Height:         General: Critically ill-appearing with some moderate respiratory distress Cardiovascular:  RRR, no m/r/g.  Respiratory:   Mild to moderate rhonchi with some intermittent wheezing.  Noted persistent cough at bedside. Abdomen:  soft, NT, ND, NABS Skin:  no rash or induration seen on limited exam Musculoskeletal:  grossly normal tone BUE/BLE, good ROM, no bony abnormality Lower extremity:  No LE edema.  Limited foot exam with no ulcerations.  2+ distal pulses. Psychiatric:  grossly normal mood  and affect, speech fluent and appropriate, AOx3 Neurologic:  CN 2-12 grossly intact, moves all extremities in coordinated fashion, sensation intact    Radiological Exams on Admission: Independently reviewed - see discussion in A/P where applicable  DG Chest Portable 1 View  Result Date: 12/23/2020 CLINICAL DATA:  Shortness of breath EXAM: PORTABLE CHEST 1 VIEW COMPARISON:  Radiograph 06/21/2020, chest CT 06/21/2020 FINDINGS: Unchanged cardiomediastinal silhouette. There is unchanged apical predominant emphysema. There are bibasilar opacities. No large pleural effusion or visible pneumothorax. Bilateral shoulder degenerative changes. No acute osseous abnormality. Partially visualized cervical spine fusion hardware. IMPRESSION: Bibasilar opacities which could represent an infectious inflammatory process. Emphysema. Electronically Signed   By: Maurine Simmering M.D.   On: 12/23/2020 14:34    EKG: Independently reviewed.  Sinus tach rate 102.  Labs on Admission: I have personally reviewed the available labs and imaging studies at the time of the admission.  Pertinent labs: Hemoglobin 12.4, hematocrit 36.9, troponin 21, sodium 133, BUN 47, creatinine 2.07, blood glucose 104, calcium 8.2.   Assessment/Plan: Acute hypoxic and hypercapnic respiratory failure secondary to COVID-19 viral pneumonia with severe COPD exacerbation: The patient will be admitted to the  stepdown unit with cardiac monitoring under inpatient status.  Aggressive pulmonary toilet.  Maintain airborne and contact precautions.  Continue Decadron.  Start remdesivir.  Scheduled Combivent has been ordered.  Vitamin C and zinc supplementation have been ordered.  PCCM following in consultation.  Use BiPAP as needed.  Continue Rocephin and Zithromax. Repeat VBG scheduled for tonight.  Monitor blood sugars closely as the patient will be on IV steroids.  Utilizing COVID-19 insulin order set.  Urine Legionella and strep antigen ordered.  Sputum culture ordered.  Severe COPD exacerbation: Plan as above.  Paroxysmal atrial fibrillation: Currently in sinus rhythm.  Continue Xarelto 20 mg daily.  Continue home Lopressor 25 mg twice daily  Hyperlipidemia: Does not appear to be on any statin medication.  BPH: Continue home Flomax 0.4 mg daily.  GERD: Continue home Protonix 20 mg daily.   Level of Care: Stepdown DVT prophylaxis: Xarelto Code Status: Full code Consults: PCCM Admission status: Inpatient   Leslee Home DO [facility]   How to contact the [facility] Attending or Consulting provider [place] or covering provider during after hours [place], for this patient?  Check the care team in [facility] and look for a) attending/consulting [facility] provider listed and b) the [facility] team listed Log into [email or web address] and use [place]'s universal password to access. If you do not have the password, please contact the hospital operator. Locate the [facility] provider you are looking for under [facility] and page to a number that you can be directly reached. If you still have difficulty reaching the provider, please page the [facility] (Director on Call) for the Hospitalists listed on amion for assistance.   12/23/2020, 8:23 PM

## 2020-12-23 NOTE — ED Notes (Signed)
Dr Mortimer Fries at bedside. Pt de satted again to 87%. Currently 99%.

## 2020-12-23 NOTE — ED Notes (Signed)
This rn spoke to wife via phone and gave update about pt.

## 2020-12-24 ENCOUNTER — Inpatient Hospital Stay: Payer: BC Managed Care – PPO

## 2020-12-24 DIAGNOSIS — U071 COVID-19: Secondary | ICD-10-CM | POA: Diagnosis not present

## 2020-12-24 DIAGNOSIS — J441 Chronic obstructive pulmonary disease with (acute) exacerbation: Secondary | ICD-10-CM | POA: Diagnosis not present

## 2020-12-24 LAB — COMPREHENSIVE METABOLIC PANEL
ALT: 22 U/L (ref 0–44)
AST: 33 U/L (ref 15–41)
Albumin: 3.3 g/dL — ABNORMAL LOW (ref 3.5–5.0)
Alkaline Phosphatase: 31 U/L — ABNORMAL LOW (ref 38–126)
Anion gap: 7 (ref 5–15)
BUN: 38 mg/dL — ABNORMAL HIGH (ref 8–23)
CO2: 28 mmol/L (ref 22–32)
Calcium: 8.5 mg/dL — ABNORMAL LOW (ref 8.9–10.3)
Chloride: 101 mmol/L (ref 98–111)
Creatinine, Ser: 1.7 mg/dL — ABNORMAL HIGH (ref 0.61–1.24)
GFR, Estimated: 41 mL/min — ABNORMAL LOW (ref >60)
Glucose, Bld: 97 mg/dL (ref 70–99)
Potassium: 5 mmol/L (ref 3.5–5.1)
Sodium: 136 mmol/L (ref 135–145)
Total Bilirubin: 0.6 mg/dL (ref 0.3–1.2)
Total Protein: 6.4 g/dL — ABNORMAL LOW (ref 6.5–8.1)

## 2020-12-24 LAB — MAGNESIUM: Magnesium: 2.1 mg/dL (ref 1.7–2.4)

## 2020-12-24 LAB — D-DIMER, QUANTITATIVE: D-Dimer, Quant: 1.23 ug/mL-FEU — ABNORMAL HIGH (ref 0.00–0.50)

## 2020-12-24 LAB — GLUCOSE, CAPILLARY
Glucose-Capillary: 95 mg/dL (ref 70–99)
Glucose-Capillary: 81 mg/dL (ref 70–99)
Glucose-Capillary: 144 mg/dL — ABNORMAL HIGH (ref 70–99)
Glucose-Capillary: 122 mg/dL — ABNORMAL HIGH (ref 70–99)
Glucose-Capillary: 195 mg/dL — ABNORMAL HIGH (ref 70–99)

## 2020-12-24 LAB — CBC WITH DIFFERENTIAL/PLATELET
Abs Immature Granulocytes: 0.05 10*3/uL (ref 0.00–0.07)
Basophils Absolute: 0 10*3/uL (ref 0.0–0.1)
Basophils Relative: 0 %
Eosinophils Absolute: 0 10*3/uL (ref 0.0–0.5)
Eosinophils Relative: 0 %
HCT: 36.6 % — ABNORMAL LOW (ref 39.0–52.0)
Hemoglobin: 12.3 g/dL — ABNORMAL LOW (ref 13.0–17.0)
Immature Granulocytes: 1 %
Lymphocytes Relative: 3 %
Lymphs Abs: 0.3 10*3/uL — ABNORMAL LOW (ref 0.7–4.0)
MCH: 34.2 pg — ABNORMAL HIGH (ref 26.0–34.0)
MCHC: 33.6 g/dL (ref 30.0–36.0)
MCV: 101.7 fL — ABNORMAL HIGH (ref 80.0–100.0)
Monocytes Absolute: 0.3 10*3/uL (ref 0.1–1.0)
Monocytes Relative: 3 %
Neutro Abs: 8.3 10*3/uL — ABNORMAL HIGH (ref 1.7–7.7)
Neutrophils Relative %: 93 %
Platelets: 158 10*3/uL (ref 150–400)
RBC: 3.6 MIL/uL — ABNORMAL LOW (ref 4.22–5.81)
RDW: 13.2 % (ref 11.5–15.5)
WBC: 8.9 10*3/uL (ref 4.0–10.5)
nRBC: 0 % (ref 0.0–0.2)

## 2020-12-24 LAB — PHOSPHORUS: Phosphorus: 4.2 mg/dL (ref 2.5–4.6)

## 2020-12-24 LAB — STREP PNEUMONIAE URINARY ANTIGEN: Strep Pneumo Urinary Antigen: NEGATIVE

## 2020-12-24 LAB — C-REACTIVE PROTEIN: CRP: 19.5 mg/dL — ABNORMAL HIGH (ref <1.0)

## 2020-12-24 LAB — FERRITIN: Ferritin: 133 ng/mL (ref 24–336)

## 2020-12-24 LAB — BRAIN NATRIURETIC PEPTIDE: B Natriuretic Peptide: 88.4 pg/mL (ref 0.0–100.0)

## 2020-12-24 MED ORDER — METOPROLOL TARTRATE 5 MG/5ML IV SOLN
5.0000 mg | Freq: Three times a day (TID) | INTRAVENOUS | Status: DC | PRN
  Administered 2020-12-24 – 2020-12-26 (×5): 5 mg via INTRAVENOUS
  Filled 2020-12-24 (×5): qty 5

## 2020-12-24 MED ORDER — DILTIAZEM HCL 25 MG/5ML IV SOLN
10.0000 mg | Freq: Four times a day (QID) | INTRAVENOUS | Status: DC | PRN
  Administered 2020-12-24 – 2020-12-26 (×4): 10 mg via INTRAVENOUS
  Filled 2020-12-24 (×6): qty 5

## 2020-12-24 MED ORDER — ENOXAPARIN SODIUM 80 MG/0.8ML IJ SOSY
1.0000 mg/kg | PREFILLED_SYRINGE | Freq: Two times a day (BID) | INTRAMUSCULAR | Status: DC
  Administered 2020-12-24 – 2020-12-26 (×6): 70 mg via SUBCUTANEOUS
  Filled 2020-12-24 (×6): qty 0.8

## 2020-12-24 MED ORDER — MOMETASONE FURO-FORMOTEROL FUM 200-5 MCG/ACT IN AERO
2.0000 | INHALATION_SPRAY | Freq: Two times a day (BID) | RESPIRATORY_TRACT | Status: DC
  Administered 2020-12-24 – 2020-12-29 (×11): 2 via RESPIRATORY_TRACT
  Filled 2020-12-24: qty 8.8

## 2020-12-24 MED ORDER — ADULT MULTIVITAMIN W/MINERALS CH
1.0000 | ORAL_TABLET | Freq: Every day | ORAL | Status: DC
  Administered 2020-12-25 – 2020-12-29 (×5): 1 via ORAL
  Filled 2020-12-24 (×5): qty 1

## 2020-12-24 MED ORDER — OXYCODONE HCL 5 MG PO TABS
5.0000 mg | ORAL_TABLET | Freq: Three times a day (TID) | ORAL | Status: DC | PRN
Start: 2020-12-24 — End: 2020-12-29
  Administered 2020-12-24 – 2020-12-28 (×8): 5 mg via ORAL
  Filled 2020-12-24 (×9): qty 1

## 2020-12-24 MED ORDER — NALOXONE HCL 0.4 MG/ML IJ SOLN: 0.4000 mg | INTRAMUSCULAR | Status: DC | PRN

## 2020-12-24 MED ORDER — SODIUM CHLORIDE 0.9 % IV SOLN
12.5000 mg | Freq: Three times a day (TID) | INTRAVENOUS | Status: DC | PRN
  Administered 2020-12-27 (×2): 12.5 mg via INTRAVENOUS
  Filled 2020-12-24 (×3): qty 0.5

## 2020-12-24 MED ORDER — OXYCODONE-ACETAMINOPHEN 10-325 MG PO TABS
1.0000 | ORAL_TABLET | Freq: Three times a day (TID) | ORAL | Status: DC | PRN
Start: 2020-12-24 — End: 2020-12-24

## 2020-12-24 MED ORDER — NEPRO/CARBSTEADY PO LIQD
237.0000 mL | Freq: Three times a day (TID) | ORAL | Status: DC
  Administered 2020-12-24 – 2020-12-27 (×9): 237 mL via ORAL

## 2020-12-24 MED ORDER — ALBUTEROL SULFATE HFA 108 (90 BASE) MCG/ACT IN AERS
2.0000 | INHALATION_SPRAY | RESPIRATORY_TRACT | Status: DC | PRN
  Filled 2020-12-24: qty 6.7

## 2020-12-24 MED ORDER — OXYCODONE HCL ER 10 MG PO T12A
10.0000 mg | EXTENDED_RELEASE_TABLET | Freq: Three times a day (TID) | ORAL | Status: DC
  Administered 2020-12-24 – 2020-12-27 (×10): 10 mg via ORAL
  Filled 2020-12-24 (×10): qty 1

## 2020-12-24 MED ORDER — IPRATROPIUM-ALBUTEROL 20-100 MCG/ACT IN AERS: 1.0000 | INHALATION_SPRAY | Freq: Four times a day (QID) | RESPIRATORY_TRACT | Status: DC | PRN

## 2020-12-24 MED ORDER — OXYCODONE-ACETAMINOPHEN 5-325 MG PO TABS
1.0000 | ORAL_TABLET | Freq: Three times a day (TID) | ORAL | Status: DC | PRN
  Administered 2020-12-25 – 2020-12-29 (×9): 1 via ORAL
  Filled 2020-12-24 (×10): qty 1

## 2020-12-24 MED ORDER — INSULIN ASPART 100 UNIT/ML IJ SOLN
1.0000 [IU] | Freq: Once | INTRAMUSCULAR | Status: AC
  Administered 2020-12-24: 1 [IU] via SUBCUTANEOUS

## 2020-12-24 MED ORDER — TIOTROPIUM BROMIDE MONOHYDRATE 18 MCG IN CAPS
18.0000 ug | ORAL_CAPSULE | Freq: Every day | RESPIRATORY_TRACT | Status: DC
  Administered 2020-12-24 – 2020-12-29 (×6): 18 ug via RESPIRATORY_TRACT
  Filled 2020-12-24 (×3): qty 5

## 2020-12-24 MED ORDER — METHYLPREDNISOLONE SODIUM SUCC 125 MG IJ SOLR
60.0000 mg | Freq: Two times a day (BID) | INTRAMUSCULAR | Status: DC
  Administered 2020-12-24 – 2020-12-26 (×5): 60 mg via INTRAVENOUS
  Filled 2020-12-24 (×5): qty 2

## 2020-12-24 MED ORDER — TOCILIZUMAB 400 MG/20ML IV SOLN
8.0000 mg/kg | Freq: Once | INTRAVENOUS | Status: AC
  Administered 2020-12-24: 554 mg via INTRAVENOUS
  Filled 2020-12-24: qty 20

## 2020-12-24 MED ORDER — INSULIN ASPART 100 UNIT/ML IJ SOLN
0.0000 [IU] | Freq: Three times a day (TID) | INTRAMUSCULAR | Status: DC
  Administered 2020-12-24: 1 [IU] via SUBCUTANEOUS
  Administered 2020-12-25: 2 [IU] via SUBCUTANEOUS
  Administered 2020-12-25 – 2020-12-27 (×4): 1 [IU] via SUBCUTANEOUS
  Administered 2020-12-27 (×2): 2 [IU] via SUBCUTANEOUS
  Administered 2020-12-28: 5 [IU] via SUBCUTANEOUS
  Administered 2020-12-28: 3 [IU] via SUBCUTANEOUS
  Administered 2020-12-29: 2 [IU] via SUBCUTANEOUS
  Filled 2020-12-24 (×11): qty 1

## 2020-12-24 MED ORDER — SODIUM ZIRCONIUM CYCLOSILICATE 5 G PO PACK
10.0000 g | PACK | Freq: Once | ORAL | Status: AC
  Administered 2020-12-24: 10 g via ORAL
  Filled 2020-12-24: qty 2

## 2020-12-24 NOTE — Progress Notes (Signed)
PT Cancellation Note  Patient Details Name: Riley Martin MRN: [id number] DOB: 24-Sep-1945   Cancelled Treatment:    Reason Eval/Treat Not Completed: Medical issues which prohibited therapy (Pt with oxygen desaturation to 70s while eating on HFNC. Not demonstrating readiness for exertional activity at this time. RN in agreement.)   Gwynneth Aliment 12/24/2020, 12:41 PM

## 2020-12-24 NOTE — Progress Notes (Signed)
[place] for enoxaparin Indication: atrial fibrillation  Allergies  Allergen Reactions   Iodinated Diagnostic Agents Other (See Comments) and Nausea Only    Dry heaves, patient states he felt like he was on fire and about to pass out.  Pre-syncope, burning  Dry heaves, patient states he felt like he was on fire and about to pass out.    Lisinopril Swelling    Tongue swelling, neck pain and Headaches    Tramadol Other (See Comments) and Shortness Of Breath    Other reaction(s): Other (See Comments) Other Reaction: tachy Dizziness and unsteady gait.   Pregabalin Palpitations    Other reaction(s): Other (See Comments) Other Reaction: edema    Patient Measurements: Height: 6' (182.9 cm) Weight: 69.2 kg (152 lb 8.9 oz) IBW/kg (Calculated) : 77.6  Vital Signs: Temp: 98.9 F (37.2 C) (08/19 0400) Temp Source: Axillary (08/19 0400) BP: 176/78 (08/19 0600) Pulse Rate: 89 (08/19 0600)  Labs: Recent Labs    12/23/20 1347 12/23/20 1635 12/24/20 0555  HGB 12.4*  --  12.3*  HCT 36.9*  --  36.6*  PLT 185  --  158  CREATININE 2.07*  --  1.70*  TROPONINIHS 21* 20*  --     Estimated Creatinine Clearance: 36.2 mL/min (A) (by C-G formula based on SCr of 1.7 mg/dL (H)).   Medical History: Past Medical History:  Diagnosis Date   Arthritis    COPD (chronic obstructive pulmonary disease) (HCC)    GERD (gastroesophageal reflux disease)    Headache    Orthostatic dizziness    PONV (postoperative nausea and vomiting)    Torn rotator cuff    left     Assessment: 75 year old male with acute hypoxic respiratory secondary to Covid pneumonitis. Patient was on BiPAP and transitioned to heated high flow. He has a history of atrial fibrillation on Xarelto PTA and currently on hold. Pharmacy consult for enoxaparin.    Plan:  Enoxaparin 1 mg/kg (70 mg) BID. Monitor CBC approximately every 3 days per protocol.   Tawnya Crook, PharmD,  BCPS Clinical Pharmacist 12/24/2020 1:20 PM

## 2020-12-24 NOTE — Progress Notes (Signed)
NAME:  Riley Martin, MRN:  [id number], DOB:  [birth year], LOS: 1 ADMISSION DATE:  12/23/2020 CONSULTATION DATE:  12/24/2020  REFERRING MD:  Rowe Pavy CHIEF COMPLAINT:  COVID 19 infection  BRIEF SYNOPSIS 74 yo WM with severe COVID 190 infection and pneumonia  History of Present Illness:  75 y.o. 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    Patient reports that he has been having increasing difficulty breathing for about the past 10 days.   He initially took a home test that was positive for COVID-19 on August 8, states his symptoms have been worsening since then.    He has been having fevers as high as 101.0 yesterday, weakness, malaise, cough, chest pain, shortness of breath, nausea, and diarrhea.  He was seen by his pulmonologist a couple of days ago due to concern for COPD exacerbation on top of his COVID-19  he states his breathing has worsened despite that and he decided to call EMS this morning.   He reportedly had a room air O2 sat of 88% and was placed on 4 L nasal cannula with improvement.   Patient has not received the COVID-19 vaccine.  Patient placed on bIPAP for severe SOB  Pertinent  Medical History  COPD  Significant Hospital Events: Including procedures, antibiotic start and stop dates in addition to other pertinent events   8/17 admitted for severe COVID 19 pneumonia Off BiPAP, tolerating nasal cannula   Micro Data:  8/8 COVID 19 infection + home test  Antimicrobials:   Antibiotics Given (last 72 hours)     Date/Time Action Medication Dose Rate   12/23/20 1505 New Bag/Given   cefTRIAXone (ROCEPHIN) 1 g in sodium chloride 0.9 % 100 mL IVPB 1 g 200 mL/hr   12/23/20 1514 New Bag/Given   azithromycin (ZITHROMAX) 500 mg in sodium chloride 0.9 % 250 mL IVPB 500 mg 250 mL/hr   12/23/20 1732 New Bag/Given  [was not availablke from pharmacy until now.]    remdesivir 200 mg in sodium chloride 0.9% 250 mL IVPB 200 mg 580 mL/hr   12/24/20 0951 New Bag/Given   remdesivir 100 mg in sodium chloride 0.9 % 100 mL IVPB 100 mg 200 mL/hr        Objective   Blood pressure (!) 176/78, pulse 89, temperature 98.9 F (37.2 C), temperature source Axillary, resp. rate 20, height 6' (1.829 m), weight 69.2 kg, SpO2 96 %.    FiO2 (%):  [50 %-58 %] 58 %   Intake/Output Summary (Last 24 hours) at 12/24/2020 1052 Last data filed at 12/24/2020 0430 Gross per 24 hour  Intake 1250 ml  Output 2675 ml  Net -1425 ml   Filed Weights   12/23/20 1355 12/23/20 2025  Weight: 68.9 kg 69.2 kg    Review of Systems: +fatigue +SOB +WOB +fevers +diarrhea All other systems reviewed and negative.   PHYSICAL EXAMINATION:  GENERAL: thin Caucasian male in NAD EYES: Pupils equal, round, reactive to light.  No scleral icterus.  PULMONARY: scattered expiratory wheezing CARDIOVASCULAR: S1 and S2.  No murmurs  GASTROINTESTINAL: Soft, nontender, -distended. Positive bowel sounds.  MUSCULOSKELETAL: No swelling, clubbing, or edema.  NEUROLOGIC: alert and awake SKIN:intact,warm,dry   FiO2 (%):  [50 %-58 %] 58 %   PFT 05/29/17; FVC equals 83% predicted, FEV1 equals 55% predicted there is 24% improvement with bronchodilator therapy.  Ratio is 52%.  Flow volume loop is  obstructed forced expiratory time is 12 seconds. TLC equals 120% predicted, FRC equals 122%, ratio is elevated consistent with air trapping.  DLCO equal 71%.  Overall this test is consistent with moderate obstructive lung disease with reversibility, and air trapping.  Labs/imaging that I havepersonally reviewed  (right click and "Reselect all SmartList Selections" daily)    ASSESSMENT AND PLAN SYNOPSIS  75 y.o. with severe COVID 19 infection and pneumonia with severe COPD exacerbation  Acute hypoxic and hypercarbic respiratory failure 2/2 Covid-10 COPD exacerbation Continue steroids,  remdesivir Aggressive pulm toilet recommended Start Dulera, Spiriva for asthma-COPD overlap Albuterol HFA as needed OOB to chair as tolerated  Pulmonary hygiene Maintain airborne and contact precautions  Vitamin C and zinc Antitussives   SEVERE COPD EXACERBATION -continue steroids as prescribed -inhaler regimen as above -SpO2 goal 88-92%  CARDIAC ICU monitoring   SEVERE SEPSIS DUE TO COVID SOURCE-LUNG  COVID 19  INFECTIOUS DISEASE -Abx per primary team -follow up cultures  ENDO - ICU hypoglycemic\Hyperglycemia protocol -check FSBS per protocol   GI GI PROPHYLAXIS as indicated  NUTRITIONAL STATUS DIET-->NPO Constipation protocol as indicated   ELECTROLYTES -follow labs as needed -replace as needed -pharmacy consultation and following   PCCM will sign off. Please call back with any further needs or questions.   Best practice (right click and "Reselect all SmartList Selections" daily)  Diet: Advance as tolerated Mobility:  OOB  Code Status:  FULL Disposition: ICU  Labs   CBC: Recent Labs  Lab 12/23/20 1347 12/24/20 0555  WBC 9.3 8.9  NEUTROABS 8.4* 8.3*  HGB 12.4* 12.3*  HCT 36.9* 36.6*  MCV 99.7 101.7*  PLT 185 546    Basic Metabolic Panel: Recent Labs  Lab 12/23/20 1347 12/24/20 0555  NA 133* 136  K 4.4 5.0  CL 101 101  CO2 23 28  GLUCOSE 104* 97  BUN 47* 38*  CREATININE 2.07* 1.70*  CALCIUM 8.2* 8.5*  MG  --  2.1  PHOS  --  4.2   GFR: Estimated Creatinine Clearance: 36.2 mL/min (A) (by C-G formula based on SCr of 1.7 mg/dL (H)). Recent Labs  Lab 12/23/20 1347 12/24/20 0555  PROCALCITON 0.71  --   WBC 9.3 8.9    Liver Function Tests: Recent Labs  Lab 12/23/20 1347 12/24/20 0555  AST 38 33  ALT 23 22  ALKPHOS 35* 31*  BILITOT 0.7 0.6  PROT 6.5 6.4*  ALBUMIN 3.4* 3.3*   No results for input(s): LIPASE, AMYLASE in the last 168 hours. No results for input(s): AMMONIA in the last 168 hours.  ABG    Component  Value Date/Time   HCO3 29.2 (H) 12/23/2020 2146   O2SAT 62.6 12/23/2020 2146     Coagulation Profile: No results for input(s): INR, PROTIME in the last 168 hours.  Cardiac Enzymes: No results for input(s): CKTOTAL, CKMB, CKMBINDEX, TROPONINI in the last 168 hours.  HbA1C: No results found for: HGBA1C  CBG: Recent Labs  Lab 12/23/20 1633 12/23/20 2019 12/23/20 2340 12/24/20 0403 12/24/20 0750  GLUCAP 96 151* 100* 95 81     Past Medical History:  He,  has a past medical history of Arthritis, COPD (chronic obstructive pulmonary disease) ([place]), GERD (gastroesophageal reflux disease), Headache, Orthostatic dizziness, PONV (postoperative nausea and vomiting), and Torn rotator cuff.   Surgical History:   Past Surgical History:  Procedure Laterality Date   COLONOSCOPY     EXCISION CHONCHA BULLOSA Bilateral 06/02/2015   Procedure: BILATERAL CHONCHA BULLOSA;  Surgeon: Carloyn Manner, MD;  Location: [place];  Service: ENT;  Laterality: Bilateral;   MAXILLARY ANTROSTOMY Bilateral 06/02/2015   Procedure: MAXILLARY ANTROSTOMY;  Surgeon: Carloyn Manner, MD;  Location: [place];  Service: ENT;  Laterality: Bilateral;   [place] SURGERY  2008   no limitations     Social History:   reports that he has been smoking cigarettes. He has a 17.50 pack-year smoking history. He has never used smokeless tobacco. He reports that he does not drink alcohol and does not use drugs.   Family History:  His family history is not on file.   Allergies Allergies  Allergen Reactions   Iodinated Diagnostic Agents Other (See Comments) and Nausea Only    Dry heaves, patient states he felt like he was on fire and about to pass out.  Pre-syncope, burning  Dry heaves, patient states he felt like he was on fire and about to pass out.    Lisinopril Swelling    Tongue swelling, neck pain and Headaches    Tramadol Other (See Comments) and Shortness Of Breath     Other reaction(s): Other (See Comments) Other Reaction: tachy Dizziness and unsteady gait.   Pregabalin Palpitations    Other reaction(s): Other (See Comments) Other Reaction: edema     Home Medications  Prior to Admission medications   Medication Sig Start Date End Date Taking? Authorizing Provider  albuterol (PROVENTIL) (2.5 MG/3ML) 0.083% nebulizer solution Take 3 mLs by nebulization every 6 (six) hours as needed for wheezing or shortness of breath. 12/13/20  Yes [provider]  fluticasone (FLONASE) 50 MCG/ACT nasal spray SPRAY 2 SPRAYS INTO EACH NOSTRIL EVERY DAY 02/07/17  Yes Laverle Hobby, MD  metoprolol tartrate (LOPRESSOR) 25 MG tablet Take 25 mg by mouth 2 (two) times daily. 12/15/20  Yes [provider]  oxyCODONE-acetaminophen (PERCOCET) 10-325 MG tablet Take 1 tablet by mouth 5 (five) times daily as needed. 05/23/20  Yes [provider]  OXYCONTIN 15 MG 12 hr tablet Take 15 mg by mouth every 8 (eight) hours. 05/23/20  Yes [provider]  pantoprazole (PROTONIX) 20 MG tablet Take 20 mg by mouth daily. 06/15/20  Yes [provider]  tamsulosin (FLOMAX) 0.4 MG CAPS capsule Take 0.4 mg by mouth daily. 09/03/16  Yes [provider]  VENTOLIN HFA 108 (90 Base) MCG/ACT inhaler Inhale 2 puffs into the lungs every 6 (six) hours as needed. 08/07/16  Yes [provider]  XARELTO 20 MG TABS tablet Take 20 mg by mouth daily. 10/08/20  Yes [provider]  megestrol (MEGACE ES) 625 MG/5ML suspension Take 625 mg by mouth daily. 08/25/20   [provider]  mirtazapine (REMERON) 15 MG tablet Take 15 mg by mouth at bedtime. 12/14/20   [provider]  OXYCONTIN 10 MG 12 hr tablet Take 10 mg by mouth 3 (three) times daily. 12/22/20   [provider]       DVT/GI PRX  assessed I Assessed the need for Labs I Assessed the need for Foley I Assessed the need for Central Venous Line Family Discussion when  available I Assessed the need for Mobilization I made an Assessment of medications to be adjusted accordingly Safety Risk assessment completed  CASE DISCUSSED IN MULTIDISCIPLINARY ROUNDS WITH ICU TEAM  Critical Care Time devoted to patient care services described in this note is 35 minutes.   Critical care was necessary to treat /prevent imminent and life-threatening deterioration.   Otelia Limes, M.D.  [facility]

## 2020-12-24 NOTE — Progress Notes (Signed)
OT Cancellation Note  Patient Details Name: Riley Martin MRN: [id number] DOB: Apr 30, 1946   Cancelled Treatment:    Reason Eval/Treat Not Completed: Patient not medically ready. Consult received, chart reviewed. Spoke with PT who just attempted to see pt. Per PT, pt with oxygen desaturation to 70's while eating on HFNC. Not demonstrating readiness for exertional activity at this time. RN in agreement. Will re-attempt OT evaluation at later date/time as able to tolerate exertional activity assessment.   Hanley Hays, MPH, MS, OTR/L ascom [phone] 12/24/20, 2:02 PM

## 2020-12-24 NOTE — Progress Notes (Addendum)
PROGRESS NOTE                                                                                                                                                                                                             Patient Demographics:    Riley Martin, is a 75 y.o. male, DOB - September 01, 1945, XFG:[id number]  Outpatient Primary MD for the patient is [name], MD    LOS - 1  Admit date - 12/23/2020    Chief Complaint  Patient presents with   Shortness of Breath    SOB worsening this am, has chest discomfort for 2 weeks       Brief Narrative (HPI from H&P)  -  Riley Martin is a 75 y.o. male with a past medical history of COPD not on home oxygen, hyperlipidemia, A. fib on Xarelto, chronic kidney disease, GERD, former smoker.   The patient presents to the emergency department due to shortness of breath that has been going on for about a week and a half now.  He tested positive for COVID-19 at home on August 8.  He is unfortunately not vaccinated, he was diagnosed with severe acute hypoxic respiratory failure due to COVID-19 pneumonia and admitted to the hospital on BiPAP.   Subjective:     [facility] today has, No headache, No chest pain, No abdominal pain - No Nausea, No new weakness tingling or numbness, +ve SOB.    Assessment  & Plan :     Acute Hypoxic Resp. Failure due to Acute Covid 19 Viral Pneumonitis during the ongoing 2020 Covid 19 Pandemic - he fortunately not vaccinated and he has incurred severe parenchymal lung injury with severe hypoxia requiring BiPAP at the time of admission, currently on 45 L heated high flow, he is on IV steroids and Remdesivir, after consent we will use Actemra as he has significant inflammation with CRP around 19.5.  Note prognosis is extremely guarded.  He has consented for Actemra and Remdesivir use, discussed with ICU physician Dr. Jonnie Finner on 12/24/2020.  He has been started on IV  antibiotics which I will taper off in the next few days based on his clinical progress do not think he has a bacterial infection.  Encouraged the patient to sit up in chair in the daytime use I-S and flutter valve for pulmonary toiletry.  Will advance activity and titrate down oxygen as possible.  Actemra/Baricitinib  off label use - patient was told that if COVID-19 pneumonitis gets worse we might potentially use Actemra off label, patient denies any known history of active diverticulitis, tuberculosis or hepatitis, understands the risks and benefits and wants to proceed with Actemra treatment if required.    Recent Labs  Lab 12/23/20 1347 12/23/20 1354 12/24/20 0555 12/24/20 0903  WBC 9.3  --  8.9  --   HGB 12.4*  --  12.3*  --   HCT 36.9*  --  36.6*  --   PLT 185  --  158  --   CRP  --   --  19.5*  --   BNP 108.0*  --  88.4  --   DDIMER  --   --   --  1.23*  PROCALCITON 0.71  --   --   --   AST 38  --  33  --   ALT 23  --  22  --   ALKPHOS 35*  --  31*  --   BILITOT 0.7  --  0.6  --   ALBUMIN 3.4*  --  3.3*  --   SARSCOV2NAA  --  POSITIVE*  --   --     2.  COPD.  Could have had mild exacerbation, currently no wheezing continue steroids for #1 above.  3.  History of paroxysmal A. fib with [place] vas 2 score of greater than 3.  Continue Lopressor, instead of Xarelto switch to Lovenox while he is in the hospital.  4.  BPH.  On Flomax continue.  5.  GERD.  On PPI.  6. Chronic Pain - patient on ++ dose of narcotics at baseline, says he goes into withdrawals if he does not take them, home medications ordered with as needed Narcan.  Advised not to overuse.   Currently on insulin for glycemic control due to high dose steroid exposure.  CBG (last 3)  Recent Labs    12/24/20 0403 12/24/20 0750 12/24/20 1157  GLUCAP 95 81 144*         Condition - Extremely Guarded  Family Communication  :  Wife [name] [phone] on 12/24/20 - clearly told grave prognosis  Code Status  :  Full  Consults  :  PCCM  PUD Prophylaxis :  PPI   Procedures  :     TTE -       Disposition Plan  :    Status is: Inpatient  Remains inpatient appropriate because:IV treatments appropriate due to intensity of illness or inability to take PO  Dispo: The patient is from: Home              Anticipated d/c is to: Home              Patient currently is not medically stable to d/c.   Difficult to place patient No  DVT Prophylaxis  : Lovenox.     Lab Results  Component Value Date   PLT 158 12/24/2020    Diet :  Diet Order             DIET SOFT Room service appropriate? Yes; Fluid consistency: Nectar Thick  Diet effective now                    Inpatient Medications  Scheduled Meds:  albuterol  2 puff Inhalation Once   vitamin C  500 mg Oral Daily   chlorhexidine  15 mL Mouth Rinse BID   Chlorhexidine Gluconate Cloth  6 each Topical Q0600   dextromethorphan-guaiFENesin  1 tablet Oral BID   fluticasone  2 spray Each Nare Daily   insulin aspart  0-9 Units Subcutaneous Q4H   insulin detemir  0.075 Units/kg Subcutaneous BID   Ipratropium-Albuterol  1 puff Inhalation Q6H   mouth rinse  15 mL Mouth Rinse q12n4p   methylPREDNISolone (SOLU-MEDROL) injection  60 mg Intravenous Q12H   metoprolol tartrate  25 mg Oral BID   mirtazapine  15 mg Oral QHS   pantoprazole  20 mg Oral Daily   sodium zirconium cyclosilicate  10 g Oral Once   tamsulosin  0.4 mg Oral Daily   zinc sulfate  220 mg Oral Daily   Continuous Infusions:  azithromycin     cefTRIAXone (ROCEPHIN)  IV     remdesivir 100 mg in NS 100 mL 100 mg (12/24/20 0951)   tocilizumab (ACTEMRA) - non-COVID treatment     PRN Meds:.acetaminophen, albuterol, HYDROcodone-acetaminophen, morphine injection, [DISCONTINUED] ondansetron **OR** ondansetron (ZOFRAN) IV, polyethylene glycol  Antibiotics  :    Anti-infectives (From admission, onward)    Start     Dose/Rate Route Frequency Ordered Stop   12/24/20 1500   cefTRIAXone (ROCEPHIN) 1 g in sodium chloride 0.9 % 100 mL IVPB        1 g 200 mL/hr over 30 Minutes Intravenous Every 24 hours 12/23/20 1533 12/28/20 1459   12/24/20 1500  azithromycin (ZITHROMAX) 500 mg in sodium chloride 0.9 % 250 mL IVPB        500 mg 250 mL/hr over 60 Minutes Intravenous Every 24 hours 12/23/20 1533 12/26/20 1459   12/24/20 1000  remdesivir 100 mg in sodium chloride 0.9 % 100 mL IVPB       See Hyperspace for full Linked Orders Report.   100 mg 200 mL/hr over 30 Minutes Intravenous Daily 12/23/20 1529 12/28/20 0959   12/23/20 1630  remdesivir 200 mg in sodium chloride 0.9% 250 mL IVPB       See Hyperspace for full Linked Orders Report.   200 mg 580 mL/hr over 30 Minutes Intravenous Once 12/23/20 1529 12/23/20 1803   12/23/20 1500  cefTRIAXone (ROCEPHIN) 1 g in sodium chloride 0.9 % 100 mL IVPB        1 g 200 mL/hr over 30 Minutes Intravenous  Once 12/23/20 1447 12/23/20 1611   12/23/20 1500  azithromycin (ZITHROMAX) 500 mg in sodium chloride 0.9 % 250 mL IVPB        500 mg 250 mL/hr over 60 Minutes Intravenous  Once 12/23/20 1447 12/23/20 1638        Time Spent in minutes  30   Lala Lund M.D on 12/24/2020 at 12:06 PM  To page go to [email or web address]   [facility] -  Office  [phone]    See all Orders from today for further details    Objective:   Vitals:   12/24/20 0600 12/24/20 0736 12/24/20 0738 12/24/20 1100  BP: (!) 176/78     Pulse: 89     Resp: 20     Temp:      TempSrc:      SpO2: 98% 96% 96% 98%  Weight:      Height:        Wt Readings from Last 3 Encounters:  12/23/20 69.2 kg  07/11/20 65.8 kg  06/23/20 60.8 kg     Intake/Output Summary (Last 24 hours) at 12/24/2020 1206 Last data  filed at 12/24/2020 0430 Gross per 24 hour  Intake 1250 ml  Output 2675 ml  Net -1425 ml     Physical Exam  Awake Alert, No new F.N deficits, Normal affect [place].AT,PERRAL Supple Neck,No JVD, No cervical lymphadenopathy appriciated.   Symmetrical Chest wall movement, Good air movement bilaterally, few fine rales RRR,No Gallops,Rubs or new Murmurs, No Parasternal Heave +ve B.Sounds, Abd Soft, No tenderness, No organomegaly appriciated, No rebound - guarding or rigidity. No Cyanosis, Clubbing or edema, No new Rash or bruise      Data Review:    CBC Recent Labs  Lab 12/23/20 1347 12/24/20 0555  WBC 9.3 8.9  HGB 12.4* 12.3*  HCT 36.9* 36.6*  PLT 185 158  MCV 99.7 101.7*  MCH 33.5 34.2*  MCHC 33.6 33.6  RDW 13.2 13.2  LYMPHSABS 0.6* 0.3*  MONOABS 0.2 0.3  EOSABS 0.0 0.0  BASOSABS 0.0 0.0    Recent Labs  Lab 12/23/20 1347 12/24/20 0555 12/24/20 0903  NA 133* 136  --   K 4.4 5.0  --   CL 101 101  --   CO2 23 28  --   GLUCOSE 104* 97  --   BUN 47* 38*  --   CREATININE 2.07* 1.70*  --   CALCIUM 8.2* 8.5*  --   AST 38 33  --   ALT 23 22  --   ALKPHOS 35* 31*  --   BILITOT 0.7 0.6  --   ALBUMIN 3.4* 3.3*  --   MG  --  2.1  --   CRP  --  19.5*  --   DDIMER  --   --  1.23*  PROCALCITON 0.71  --   --   BNP 108.0* 88.4  --     ------------------------------------------------------------------------------------------------------------------ No results for input(s): CHOL, HDL, LDLCALC, TRIG, CHOLHDL, LDLDIRECT in the last 72 hours.  No results found for: HGBA1C ------------------------------------------------------------------------------------------------------------------ No results for input(s): TSH, T4TOTAL, T3FREE, THYROIDAB in the last 72 hours.  Invalid input(s): FREET3  Cardiac Enzymes No results for input(s): CKMB, TROPONINI, MYOGLOBIN in the last 168 hours.  Invalid input(s): CK ------------------------------------------------------------------------------------------------------------------    Component Value Date/Time   BNP 88.4 12/24/2020 0555     Radiology Reports DG Chest Port 1 View  Result Date: 12/24/2020 CLINICAL DATA:  COVID and shortness of breath EXAM: PORTABLE  CHEST 1 VIEW COMPARISON:  Yesterday FINDINGS: Reticular opacities at the bases which are increased. No fibrosis on a February 2022 chest CT. No edema, effusion, or pneumothorax. Normal heart size and mediastinal contours. IMPRESSION: Increased infiltrates at the lung bases. Electronically Signed   By: Monte Fantasia M.D.   On: 12/24/2020 07:37   DG Chest Portable 1 View  Result Date: 12/23/2020 CLINICAL DATA:  Shortness of breath EXAM: PORTABLE CHEST 1 VIEW COMPARISON:  Radiograph 06/21/2020, chest CT 06/21/2020 FINDINGS: Unchanged cardiomediastinal silhouette. There is unchanged apical predominant emphysema. There are bibasilar opacities. No large pleural effusion or visible pneumothorax. Bilateral shoulder degenerative changes. No acute osseous abnormality. Partially visualized cervical spine fusion hardware. IMPRESSION: Bibasilar opacities which could represent an infectious inflammatory process. Emphysema. Electronically Signed   By: Maurine Simmering M.D.   On: 12/23/2020 14:34

## 2020-12-25 ENCOUNTER — Inpatient Hospital Stay (HOSPITAL_COMMUNITY)
Admit: 2020-12-25 | Discharge: 2020-12-25 | Disposition: A | Discharge status: Home / Self Care | Payer: BC Managed Care – PPO | Attending: Internal Medicine | Admitting: Internal Medicine

## 2020-12-25 ENCOUNTER — Inpatient Hospital Stay: Payer: BC Managed Care – PPO

## 2020-12-25 DIAGNOSIS — R0603 Acute respiratory distress: Secondary | ICD-10-CM

## 2020-12-25 LAB — CBC WITH DIFFERENTIAL/PLATELET
Abs Immature Granulocytes: 0.04 10*3/uL (ref 0.00–0.07)
Basophils Absolute: 0 10*3/uL (ref 0.0–0.1)
Basophils Relative: 0 %
Eosinophils Absolute: 0 10*3/uL (ref 0.0–0.5)
Eosinophils Relative: 0 %
HCT: 37.3 % — ABNORMAL LOW (ref 39.0–52.0)
Hemoglobin: 12.6 g/dL — ABNORMAL LOW (ref 13.0–17.0)
Immature Granulocytes: 1 %
Lymphocytes Relative: 4 %
Lymphs Abs: 0.3 10*3/uL — ABNORMAL LOW (ref 0.7–4.0)
MCH: 34.1 pg — ABNORMAL HIGH (ref 26.0–34.0)
MCHC: 33.8 g/dL (ref 30.0–36.0)
MCV: 100.8 fL — ABNORMAL HIGH (ref 80.0–100.0)
Monocytes Absolute: 0.2 10*3/uL (ref 0.1–1.0)
Monocytes Relative: 3 %
Neutro Abs: 6.3 10*3/uL (ref 1.7–7.7)
Neutrophils Relative %: 92 %
Platelets: 174 10*3/uL (ref 150–400)
RBC: 3.7 MIL/uL — ABNORMAL LOW (ref 4.22–5.81)
RDW: 13 % (ref 11.5–15.5)
WBC: 6.8 10*3/uL (ref 4.0–10.5)
nRBC: 0 % (ref 0.0–0.2)

## 2020-12-25 LAB — GLUCOSE, CAPILLARY
Glucose-Capillary: 134 mg/dL — ABNORMAL HIGH (ref 70–99)
Glucose-Capillary: 147 mg/dL — ABNORMAL HIGH (ref 70–99)
Glucose-Capillary: 176 mg/dL — ABNORMAL HIGH (ref 70–99)
Glucose-Capillary: 116 mg/dL — ABNORMAL HIGH (ref 70–99)

## 2020-12-25 LAB — MAGNESIUM: Magnesium: 2.1 mg/dL (ref 1.7–2.4)

## 2020-12-25 LAB — COMPREHENSIVE METABOLIC PANEL
ALT: 22 U/L (ref 0–44)
AST: 26 U/L (ref 15–41)
Albumin: 2.7 g/dL — ABNORMAL LOW (ref 3.5–5.0)
Alkaline Phosphatase: 28 U/L — ABNORMAL LOW (ref 38–126)
Anion gap: 10 (ref 5–15)
BUN: 39 mg/dL — ABNORMAL HIGH (ref 8–23)
CO2: 24 mmol/L (ref 22–32)
Calcium: 8.1 mg/dL — ABNORMAL LOW (ref 8.9–10.3)
Chloride: 101 mmol/L (ref 98–111)
Creatinine, Ser: 1.57 mg/dL — ABNORMAL HIGH (ref 0.61–1.24)
GFR, Estimated: 45 mL/min — ABNORMAL LOW (ref >60)
Glucose, Bld: 169 mg/dL — ABNORMAL HIGH (ref 70–99)
Potassium: 4.3 mmol/L (ref 3.5–5.1)
Sodium: 135 mmol/L (ref 135–145)
Total Bilirubin: 0.7 mg/dL (ref 0.3–1.2)
Total Protein: 5.7 g/dL — ABNORMAL LOW (ref 6.5–8.1)

## 2020-12-25 LAB — FERRITIN: Ferritin: 163 ng/mL (ref 24–336)

## 2020-12-25 LAB — ECHOCARDIOGRAM COMPLETE
Area-P 1/2: 4.86 cm2
Height: 72 in
S' Lateral: 3.07 cm
Weight: 2440.93 oz

## 2020-12-25 LAB — PHOSPHORUS: Phosphorus: 3.7 mg/dL (ref 2.5–4.6)

## 2020-12-25 LAB — C-REACTIVE PROTEIN: CRP: 12.4 mg/dL — ABNORMAL HIGH (ref <1.0)

## 2020-12-25 LAB — BRAIN NATRIURETIC PEPTIDE: B Natriuretic Peptide: 635.8 pg/mL — ABNORMAL HIGH (ref 0.0–100.0)

## 2020-12-25 LAB — D-DIMER, QUANTITATIVE: D-Dimer, Quant: 0.69 ug/mL-FEU — ABNORMAL HIGH (ref 0.00–0.50)

## 2020-12-25 MED ORDER — METOPROLOL TARTRATE 50 MG PO TABS
50.0000 mg | ORAL_TABLET | Freq: Two times a day (BID) | ORAL | Status: DC
  Administered 2020-12-25 – 2020-12-29 (×8): 50 mg via ORAL
  Filled 2020-12-25 (×8): qty 1

## 2020-12-25 MED ORDER — DILTIAZEM HCL 60 MG PO TABS
60.0000 mg | ORAL_TABLET | Freq: Three times a day (TID) | ORAL | Status: DC
  Administered 2020-12-25: 60 mg via ORAL
  Filled 2020-12-25: qty 1

## 2020-12-25 MED ORDER — FUROSEMIDE 20 MG PO TABS
40.0000 mg | ORAL_TABLET | Freq: Once | ORAL | Status: AC
  Administered 2020-12-25: 40 mg via ORAL
  Filled 2020-12-25: qty 2

## 2020-12-25 MED ORDER — DIPHENHYDRAMINE HCL 25 MG PO CAPS
25.0000 mg | ORAL_CAPSULE | Freq: Every evening | ORAL | Status: DC | PRN
  Administered 2020-12-25 – 2020-12-28 (×4): 25 mg via ORAL
  Filled 2020-12-25 (×5): qty 1

## 2020-12-25 NOTE — Progress Notes (Signed)
*  PRELIMINARY RESULTS* Echocardiogram 2D Echocardiogram has been performed.  Claretta Fraise 12/25/2020, 11:48 AM

## 2020-12-25 NOTE — Evaluation (Signed)
Occupational Therapy Evaluation Patient Details Name: Riley Martin MRN: [id number] DOB: 1945/11/11 Today's Date: 12/25/2020    History of Present Illness Pt is a 75 y.o. male presenting to hospital 8/18 with SOB (increasing x10 days); chest discomfort for 2 weeks.  Home test (+) COVID-19 on August 8th.  Pt admitted with acute hypoxic and hypercapnic respiratory failure secondary to COVID-19 viral PNA with severe COPD exacerbation.   PMH includes HLD, a-fib on Eliquis, CKD, GERD, COPD, orthostatic dizziness, L torn rotator cuff, IBS with constipation, and neck surgery.   Clinical Impression   Pt seen for OT evaluation this date in setting of acute hospitalization d/t COVID. Pt reports being INDEP at baseline. Pt presents this date with decreased fxl activity tolerance and cardiopulmonary status impacting his ability to safely and efficiently complete ADLs/ADL mobility. On OT assessment this date, pt requires: SETUP/SUPV for energy conservation for seated UB ADLs and cues for pacing and modifying LB tasks to perform in sitting if able. Pt requires CGA/MIN A for standing LB tasks primarily d/t decreased tolerane for more dynamic standing ADLs 2/2 increased exertion requirements. OT engages pt in bathing tasks with SETUP, sponge bathing with cues for modification and pacing for energy conservation. OT completes ed re: RPE and pt with moderate reception. Will continue to follow acutely. Anticipate pt will require HHOT f/u to offer strategies for energy conservation in the natural environment. Pt might also benefit from f/u with pulmonary rehab should the team agree that this would be beneficial.    Follow Up Recommendations  Home health OT;Supervision - Intermittent    Equipment Recommendations  3 in 1 bedside commode;Tub/shower seat    Recommendations for Other Services       Precautions / Restrictions Precautions Precautions: Fall Restrictions Weight Bearing Restrictions: No       Mobility Bed Mobility Overal bed mobility: Modified Independent             General bed mobility comments: Deferred (pt sitting edge of bed with OT present end of session)    Transfers Overall transfer level: Needs assistance Equipment used: None Transfers: Sit to/from [facility] Sit to Stand: Supervision Stand pivot transfers: Supervision       General transfer comment: SBA for line/lead mgt, pt with good lift off and control. requires UE support intermittently primarily for energy conservation    Balance Overall balance assessment: Needs assistance Sitting-balance support: No upper extremity supported;Feet supported Sitting balance-Leahy Scale: Normal Sitting balance - Comments: normal static sitting, G dynamic sitting   Standing balance support: No upper extremity supported;During functional activity Standing balance-Leahy Scale: Good Standing balance comment: no sway or LOB in static standing with no AD, G/F dynamic standing with no AD for bathing tasks (UE support intermittently for energy conservation)                           ADL either performed or assessed with clinical judgement   ADL                                         General ADL Comments: requires SETUP/SUPV for energy conservation for seated UB ADLs and cues for pacing and modifying LB tasks to perform in sitting if able. Pt requires CGA/MIN A for standing LB tasks primarily d/t decreased tolerane for more dynamic standing ADLs 2/2 increased exertion requirements.  Vision Patient Visual Report: No change from baseline       Perception     Praxis      Pertinent Vitals/Pain Pain Assessment: No/denies pain     Hand Dominance     Extremity/Trunk Assessment Upper Extremity Assessment Upper Extremity Assessment: Overall WFL for tasks assessed;Generalized weakness (ROM WFL with some baseline shoulder limitations. grip MMT grossly 4-/5)   Lower  Extremity Assessment Lower Extremity Assessment: Defer to PT evaluation;Generalized weakness   Cervical / Trunk Assessment Cervical / Trunk Assessment: Normal   Communication Communication Communication: No difficulties   Cognition Arousal/Alertness: Awake/alert Behavior During Therapy: WFL for tasks assessed/performed Overall Cognitive Status: Within Functional Limits for tasks assessed                                     General Comments       Exercises Other Exercises Other Exercises: OT ed re: role of OT in acute setting, task pacing/energy conservation, RPE and when to take rest.   Shoulder Instructions      Home Living Family/patient expects to be discharged to:: Private residence Living Arrangements: Spouse/significant other Available Help at Discharge: Family Type of Home: House Home Access: Stairs to enter Technical brewer of Steps: 3 Entrance Stairs-Rails: Left Home Layout: One level     Bathroom Shower/Tub: Teacher, early years/pre: Handicapped height     Home Equipment: None          Prior Functioning/Environment Level of Independence: Independent        Comments: Limited to household distances/shorter walks d/t SOB last year        OT Problem List: Decreased strength;Decreased activity tolerance;Cardiopulmonary status limiting activity      OT Treatment/Interventions: Self-care/ADL training;DME and/or AE instruction;Therapeutic activities;Therapeutic exercise;Energy conservation;Patient/family education    OT Goals(Current goals can be found in the care plan section) Acute Rehab OT Goals Patient Stated Goal: to go home OT Goal Formulation: With patient Time For Goal Achievement: 01/08/21 Potential to Achieve Goals: Good ADL Goals Pt Will Perform Lower Body Dressing: with supervision;sit to/from stand (with LRAD/AE PRN for energy conservation) Pt Will Transfer to Toilet: with supervision;ambulating;bedside  commode (with LRAD to commode ~10-15' away to increase tolerance for fxl HH distances) Pt Will Perform Toileting - Clothing Manipulation and hygiene: with supervision;sit to/from stand (with use of grab bar and no cues for pacing to improve pt's energy budgeting for functional tasks) Pt Will Perform Tub/Shower Transfer: with supervision;ambulating;shower seat;grab bars;rolling walker (with 0% cues for pacing/self-initiating rest breaks as needed.) Pt/caregiver will Perform Home Exercise Program: Increased strength;Both right and left upper extremity;With Supervision  OT Frequency: Min 2X/week   Barriers to D/C:            Co-evaluation              AM-PAC OT "6 Clicks" Daily Activity     Outcome Measure Help from another person eating meals?: None Help from another person taking care of personal grooming?: A Little Help from another person toileting, which includes using toliet, bedpan, or urinal?: A Little Help from another person bathing (including washing, rinsing, drying)?: A Little Help from another person to put on and taking off regular upper body clothing?: None Help from another person to put on and taking off regular lower body clothing?: A Little 6 Click Score: 20   End of Session Equipment Utilized During Treatment: Oxygen Nurse  Communication: Mobility status;Other (comment) (HR/O2 with activity, titrate up to 15L with transitions and transfers)  Activity Tolerance: Patient tolerated treatment well Patient left: in bed;with call bell/phone within reach  OT Visit Diagnosis: Unsteadiness on feet (R26.81);Muscle weakness (generalized) (M62.81)                Time: 0920-0415 OT Time Calculation (min): 29 min Charges:  OT General Charges $OT Visit: 1 Visit OT Evaluation $OT Eval Moderate Complexity: 1 Mod OT Treatments $Self Care/Home Management : 8-22 mins  Gerrianne Scale, MS, OTR/L ascom [phone] 12/25/20, 4:23 PM

## 2020-12-25 NOTE — Progress Notes (Signed)
PROGRESS NOTE                                                                                                                                                                                                             Patient Demographics:    Riley Martin, is a 75 y.o. male, DOB - 10-Oct-1945, SKA:[id number]  Outpatient Primary MD for the patient is [name], MD    LOS - 2  Admit date - 12/23/2020    Chief Complaint  Patient presents with   Shortness of Breath    SOB worsening this am, has chest discomfort for 2 weeks       Brief Narrative (HPI from H&P)  -  Riley Martin is a 75 y.o. male with a past medical history of COPD not on home oxygen, hyperlipidemia, A. fib on Xarelto, chronic kidney disease, GERD, former smoker.   The patient presents to the emergency department due to shortness of breath that has been going on for about a week and a half now.  He tested positive for COVID-19 at home on August 8.  He is unfortunately not vaccinated, he was diagnosed with severe acute hypoxic respiratory failure due to COVID-19 pneumonia and admitted to the hospital on BiPAP.   Subjective:    [facility] today has, No headache, No chest pain, No abdominal pain - No Nausea, No new weakness tingling or numbness, +ve SOB.    Assessment  & Plan :     Acute Hypoxic Resp. Failure due to Acute Covid 19 Viral Pneumonitis during the ongoing 2020 Covid 19 Pandemic - he fortunately not vaccinated and he has incurred severe parenchymal lung injury with severe hypoxia requiring BiPAP at the time of admission >> 45 L heated high flow, he was placed on IV steroids, remdesivir and given Actemra on 12/24/2020 after appropriate consent, he is still severely sick with grave prognosis however in the last 24 hours has made good recovery, he is currently down to 11 to 12 L nasal cannula oxygen, still feels short of breath but much improved from  the time of admission.  Continue monitoring on IV steroids, completed remdesivir course, for now continue empiric antibiotics for a total of 3 days, do not think there is a bacterial component to his presentation however due to underlying COPD will complete short course  of antibiotics.  PCCM following appreciate their input.  Encouraged the patient to sit up in chair in the daytime use I-S and flutter valve for pulmonary toiletry.  Will advance activity and titrate down oxygen as possible.      Recent Labs  Lab 12/23/20 1347 12/23/20 1354 12/24/20 0555 12/24/20 0903 12/25/20 0624  WBC 9.3  --  8.9  --  6.8  HGB 12.4*  --  12.3*  --  12.6*  HCT 36.9*  --  36.6*  --  37.3*  PLT 185  --  158  --  174  CRP  --   --  19.5*  --   --   BNP 108.0*  --  88.4  --  635.8*  DDIMER  --   --   --  1.23* 0.69*  PROCALCITON 0.71  --   --   --   --   AST 38  --  33  --  26  ALT 23  --  22  --  22  ALKPHOS 35*  --  31*  --  28*  BILITOT 0.7  --  0.6  --  0.7  ALBUMIN 3.4*  --  3.3*  --  2.7*  SARSCOV2NAA  --  POSITIVE*  --   --   --     2.  COPD.  Could have had mild exacerbation, currently no wheezing continue steroids for #1 above.  3.  History of paroxysmal A. fib with [place] vas 2 score of greater than 3.  [place] on Lovenox, switch to Xarelto once he is better, Lopressor dose doubled, as needed IV Cardizem and IV Lopressor for rate control, if rate becomes a challenge will start on IV Cardizem drip if blood pressure low then IV amiodarone.  4.  BPH.  On Flomax continue.  5.  GERD.  On PPI.  6. Chronic Pain - patient on ++ dose of narcotics at baseline, says he goes into withdrawals if he does not take them, home medications ordered with as needed Narcan.  Advised not to overuse.   Currently on insulin for glycemic control due to high dose steroid exposure.  CBG (last 3)  Recent Labs    12/24/20 1535 12/24/20 2119 12/25/20 0748  GLUCAP 122* 195* 134*         Condition -  Extremely Guarded  Family Communication  :  Wife [name] [phone] on 12/24/20 - clearly told grave prognosis  Code Status :  Full  Consults  :  PCCM  PUD Prophylaxis :  PPI   Procedures  :     TTE -       Disposition Plan  :    Status is: Inpatient  Remains inpatient appropriate because:IV treatments appropriate due to intensity of illness or inability to take PO  Dispo: The patient is from: Home              Anticipated d/c is to: Home              Patient currently is not medically stable to d/c.   Difficult to place patient No  DVT Prophylaxis  : Lovenox.     Lab Results  Component Value Date   PLT 174 12/25/2020    Diet :  Diet Order             DIET SOFT Room service appropriate? Yes; Fluid consistency: Thin  Diet effective now  Inpatient Medications  Scheduled Meds:  vitamin C  500 mg Oral Daily   chlorhexidine  15 mL Mouth Rinse BID   Chlorhexidine Gluconate Cloth  6 each Topical Q0600   dextromethorphan-guaiFENesin  1 tablet Oral BID   enoxaparin (LOVENOX) injection  1 mg/kg Subcutaneous BID   feeding supplement (NEPRO CARB STEADY)  237 mL Oral TID BM   fluticasone  2 spray Each Nare Daily   furosemide  40 mg Oral Once   insulin aspart  0-9 Units Subcutaneous TID WC   insulin detemir  0.075 Units/kg Subcutaneous BID   mouth rinse  15 mL Mouth Rinse q12n4p   methylPREDNISolone (SOLU-MEDROL) injection  60 mg Intravenous Q12H   metoprolol tartrate  50 mg Oral BID   mirtazapine  15 mg Oral QHS   mometasone-formoterol  2 puff Inhalation BID   multivitamin with minerals  1 tablet Oral Daily   oxyCODONE  10 mg Oral TID   pantoprazole  20 mg Oral Daily   tamsulosin  0.4 mg Oral Daily   tiotropium  18 mcg Inhalation Daily   zinc sulfate  220 mg Oral Daily   Continuous Infusions:  azithromycin Stopped (12/24/20 1611)   cefTRIAXone (ROCEPHIN)  IV Stopped (12/24/20 1457)   promethazine (PHENERGAN) injection (IM or IVPB)      remdesivir 100 mg in NS 100 mL 100 mg (12/25/20 0851)   PRN Meds:.acetaminophen, albuterol, diltiazem, diphenhydrAMINE, metoprolol tartrate, naLOXone (NARCAN)  injection, [DISCONTINUED] ondansetron **OR** ondansetron (ZOFRAN) IV, oxyCODONE-acetaminophen **AND** oxyCODONE, polyethylene glycol, promethazine (PHENERGAN) injection (IM or IVPB)  Antibiotics  :    Anti-infectives (From admission, onward)    Start     Dose/Rate Route Frequency Ordered Stop   12/24/20 1500  cefTRIAXone (ROCEPHIN) 1 g in sodium chloride 0.9 % 100 mL IVPB        1 g 200 mL/hr over 30 Minutes Intravenous Every 24 hours 12/23/20 1533 12/28/20 1459   12/24/20 1500  azithromycin (ZITHROMAX) 500 mg in sodium chloride 0.9 % 250 mL IVPB        500 mg 250 mL/hr over 60 Minutes Intravenous Every 24 hours 12/23/20 1533 12/26/20 1459   12/24/20 1000  remdesivir 100 mg in sodium chloride 0.9 % 100 mL IVPB       See Hyperspace for full Linked Orders Report.   100 mg 200 mL/hr over 30 Minutes Intravenous Daily 12/23/20 1529 12/28/20 0959   12/23/20 1630  remdesivir 200 mg in sodium chloride 0.9% 250 mL IVPB       See Hyperspace for full Linked Orders Report.   200 mg 580 mL/hr over 30 Minutes Intravenous Once 12/23/20 1529 12/23/20 1803   12/23/20 1500  cefTRIAXone (ROCEPHIN) 1 g in sodium chloride 0.9 % 100 mL IVPB        1 g 200 mL/hr over 30 Minutes Intravenous  Once 12/23/20 1447 12/23/20 1611   12/23/20 1500  azithromycin (ZITHROMAX) 500 mg in sodium chloride 0.9 % 250 mL IVPB        500 mg 250 mL/hr over 60 Minutes Intravenous  Once 12/23/20 1447 12/23/20 1638        Time Spent in minutes  30   Lala Lund M.D on 12/25/2020 at 9:28 AM  To page go to [email or web address]   [facility] -  Office  [phone]    See all Orders from today for further details    Objective:   Vitals:   12/25/20 0500 12/25/20 0520 12/25/20 0559 12/25/20 0600  BP:  111/77  112/74  Pulse: (!) 55 88 (!) 45 67  Resp: 16 14  12 14   Temp:      TempSrc:      SpO2: 97% 97% 95% 95%  Weight:      Height:        Wt Readings from Last 3 Encounters:  12/23/20 69.2 kg  07/11/20 65.8 kg  06/23/20 60.8 kg     Intake/Output Summary (Last 24 hours) at 12/25/2020 0928 Last data filed at 12/25/2020 0600 Gross per 24 hour  Intake 930 ml  Output 2950 ml  Net -2020 ml     Physical Exam  Awake Alert, No new F.N deficits, Normal affect [place].AT,PERRAL Supple Neck,No JVD, No cervical lymphadenopathy appriciated.  Symmetrical Chest wall movement, Good air movement bilaterally, few crackles RRR,No Gallops, Rubs or new Murmurs, No Parasternal Heave +ve B.Sounds, Abd Soft, No tenderness, No organomegaly appriciated, No rebound - guarding or rigidity. No Cyanosis, Clubbing or edema, No new Rash or bruise    Data Review:    CBC Recent Labs  Lab 12/23/20 1347 12/24/20 0555 12/25/20 0624  WBC 9.3 8.9 6.8  HGB 12.4* 12.3* 12.6*  HCT 36.9* 36.6* 37.3*  PLT 185 158 174  MCV 99.7 101.7* 100.8*  MCH 33.5 34.2* 34.1*  MCHC 33.6 33.6 33.8  RDW 13.2 13.2 13.0  LYMPHSABS 0.6* 0.3* 0.3*  MONOABS 0.2 0.3 0.2  EOSABS 0.0 0.0 0.0  BASOSABS 0.0 0.0 0.0    Recent Labs  Lab 12/23/20 1347 12/24/20 0555 12/24/20 0903 12/25/20 0624  NA 133* 136  --  135  K 4.4 5.0  --  4.3  CL 101 101  --  101  CO2 23 28  --  24  GLUCOSE 104* 97  --  169*  BUN 47* 38*  --  39*  CREATININE 2.07* 1.70*  --  1.57*  CALCIUM 8.2* 8.5*  --  8.1*  AST 38 33  --  26  ALT 23 22  --  22  ALKPHOS 35* 31*  --  28*  BILITOT 0.7 0.6  --  0.7  ALBUMIN 3.4* 3.3*  --  2.7*  MG  --  2.1  --  2.1  CRP  --  19.5*  --   --   DDIMER  --   --  1.23* 0.69*  PROCALCITON 0.71  --   --   --   BNP 108.0* 88.4  --  635.8*    ------------------------------------------------------------------------------------------------------------------ No results for input(s): CHOL, HDL, LDLCALC, TRIG, CHOLHDL, LDLDIRECT in the last 72 hours.  No results found  for: HGBA1C ------------------------------------------------------------------------------------------------------------------ No results for input(s): TSH, T4TOTAL, T3FREE, THYROIDAB in the last 72 hours.  Invalid input(s): FREET3  Cardiac Enzymes No results for input(s): CKMB, TROPONINI, MYOGLOBIN in the last 168 hours.  Invalid input(s): CK ------------------------------------------------------------------------------------------------------------------    Component Value Date/Time   BNP 635.8 (H) 12/25/2020 0624     Radiology Reports DG Chest Port 1 View  Result Date: 12/25/2020 CLINICAL DATA:  Continued shortness of breath.  COVID. EXAM: PORTABLE CHEST 1 VIEW COMPARISON:  Yesterday FINDINGS: Stable infiltrates at the lung bases. Apical emphysematous markings. Normal heart size and mediastinal contours. No visible effusion or pneumothorax. IMPRESSION: Stable lower lobe infiltrates. Electronically Signed   By: Monte Fantasia M.D.   On: 12/25/2020 07:55   DG Chest Port 1 View  Result Date: 12/24/2020 CLINICAL DATA:  COVID and shortness of breath EXAM: PORTABLE CHEST 1 VIEW COMPARISON:  Yesterday FINDINGS: Reticular opacities  at the bases which are increased. No fibrosis on a February 2022 chest CT. No edema, effusion, or pneumothorax. Normal heart size and mediastinal contours. IMPRESSION: Increased infiltrates at the lung bases. Electronically Signed   By: Monte Fantasia M.D.   On: 12/24/2020 07:37   DG Chest Portable 1 View  Result Date: 12/23/2020 CLINICAL DATA:  Shortness of breath EXAM: PORTABLE CHEST 1 VIEW COMPARISON:  Radiograph 06/21/2020, chest CT 06/21/2020 FINDINGS: Unchanged cardiomediastinal silhouette. There is unchanged apical predominant emphysema. There are bibasilar opacities. No large pleural effusion or visible pneumothorax. Bilateral shoulder degenerative changes. No acute osseous abnormality. Partially visualized cervical spine fusion hardware. IMPRESSION:  Bibasilar opacities which could represent an infectious inflammatory process. Emphysema. Electronically Signed   By: Maurine Simmering M.D.   On: 12/23/2020 14:34   DG Abd Portable 1V  Result Date: 12/24/2020 CLINICAL DATA:  Nausea and vomiting EXAM: PORTABLE ABDOMEN - 1 VIEW COMPARISON:  11/17/2019 FINDINGS: 2 supine frontal views of the abdomen and pelvis are obtained. Portions of the right flank and right hemidiaphragm are excluded by collimation. The bowel gas pattern is unremarkable without obstruction or ileus. No masses or abnormal calcifications. Patchy bibasilar airspace disease again noted, not appreciably changed since preceding chest x-ray. IMPRESSION: 1. Unremarkable bowel gas pattern. 2. Bibasilar airspace disease unchanged. Electronically Signed   By: Randa Ngo M.D.   On: 12/24/2020 16:22

## 2020-12-25 NOTE — Evaluation (Signed)
Physical Therapy Evaluation Patient Details Name: Riley Venning Martin MRN: [id number] DOB: [birth year] Today's Date: 12/25/2020   History of Present Illness  Pt is a 75 y.o. male presenting to hospital 8/18 with SOB (increasing x10 days); chest discomfort for 2 weeks.  Home test (+) COVID-19 on August 8th.  Pt admitted with acute hypoxic and hypercapnic respiratory failure secondary to COVID-19 viral PNA with severe COPD exacerbation.   PMH includes HLD, a-fib on Eliquis, CKD, GERD, COPD, orthostatic dizziness, L torn rotator cuff, IBS with constipation, and neck surgery.  Clinical Impression  Prior to hospital admission, pt was independent with ambulation (limited distances d/t SOB for last year); lives with his wife in 1 level home with 3 STE L railing.  Currently pt is SBA with transfers and walking a few feet from recliner to bed (no AD).  Overall pt steady but noted with mild SOB after sitting on edge of bed.  Pt's O2 sats 92% on 12 L O2 via HFNC beginning of session at rest; per discussion with pt's nurse increased O2 to 15L HFNC for mobility back to bed (d/t O2 desaturation and elevated HR with transfer to recliner earlier today); and pt titrated back to 12 L O2 via HFNC end of session at 92%.  HR around 100 bpm at rest but increased up to 120 bpm with activity.  Pt sitting edge of bed with OT present end of session.  Pt would benefit from skilled PT to address noted impairments and functional limitations (see below for any additional details).  Upon hospital discharge, pt would benefit from [facility].    Follow Up Recommendations Home health PT    Equipment Recommendations  Other (comment) (TBD per pt's progress)    Recommendations for Other Services OT consult     Precautions / Restrictions Precautions Precautions: Fall Restrictions Weight Bearing Restrictions: No      Mobility  Bed Mobility               General bed mobility comments: Deferred (pt sitting edge of bed with OT  present end of session)    Transfers Overall transfer level: Needs assistance Equipment used: None Transfers: Sit to/from [facility] Sit to Stand: Supervision         General transfer comment: SBA for lines management; steady  Ambulation/Gait Ambulation/Gait assistance: Supervision Gait Distance (Feet): 3 Feet (recliner to bed) Assistive device: None       General Gait Details: steady  Science writer    Modified Rankin (Stroke Patients Only)       Balance Overall balance assessment: Needs assistance Sitting-balance support: No upper extremity supported;Feet supported Sitting balance-Leahy Scale: Normal Sitting balance - Comments: steady sitting reaching outside BOS   Standing balance support: No upper extremity supported;During functional activity Standing balance-Leahy Scale: Good Standing balance comment: no loss of balance taking steps recliner to bed and standing reaching forward to throw away tissue into trash can                             Pertinent Vitals/Pain Pain Assessment: No/denies pain    Home Living Family/patient expects to be discharged to:: Private residence Living Arrangements: Spouse/significant other Available Help at Discharge: Family Type of Home: House Home Access: Stairs to enter Entrance Stairs-Rails: Left Entrance Stairs-Number of Steps: 3 Home Layout: One level Home Equipment: None  Prior Function Level of Independence: Independent         Comments: Limited to household distances/shorter walks d/t SOB last year     Hand Dominance        Extremity/Trunk Assessment   Upper Extremity Assessment Upper Extremity Assessment: Defer to OT evaluation    Lower Extremity Assessment Lower Extremity Assessment: Generalized weakness    Cervical / Trunk Assessment Cervical / Trunk Assessment: Normal  Communication   Communication: No difficulties  Cognition  Arousal/Alertness: Awake/alert Behavior During Therapy: WFL for tasks assessed/performed Overall Cognitive Status: Within Functional Limits for tasks assessed                                        General Comments  Nursing cleared pt for participation in physical therapy.  Pt agreeable to PT session.    Exercises     Assessment/Plan    PT Assessment Patient needs continued PT services  PT Problem List Decreased strength;Decreased activity tolerance;Decreased mobility;Decreased knowledge of use of DME;Decreased knowledge of precautions;Cardiopulmonary status limiting activity       PT Treatment Interventions DME instruction;Gait training;Stair training;Functional mobility training;Therapeutic activities;Therapeutic exercise;Balance training;Patient/family education    PT Goals (Current goals can be found in the Care Plan section)  Acute Rehab PT Goals Patient Stated Goal: to go home PT Goal Formulation: With patient Time For Goal Achievement: 01/08/21 Potential to Achieve Goals: Good    Frequency Min 2X/week   Barriers to discharge        Co-evaluation               AM-PAC PT "6 Clicks" Mobility  Outcome Measure Help needed turning from your back to your side while in a flat bed without using bedrails?: None Help needed moving from lying on your back to sitting on the side of a flat bed without using bedrails?: None Help needed moving to and from a bed to a chair (including a wheelchair)?: A Little Help needed standing up from a chair using your arms (e.g., wheelchair or bedside chair)?: A Little Help needed to walk in hospital room?: A Little Help needed climbing 3-5 steps with a railing? : A Little 6 Click Score: 20    End of Session Equipment Utilized During Treatment: Gait belt Activity Tolerance: Patient tolerated treatment well Patient left:  (sitting on edge of bed with OT present) Nurse Communication: Mobility status;Precautions;Other  (comment) (pt's vitals during session and O2 needs) PT Visit Diagnosis: Muscle weakness (generalized) (M62.81);Difficulty in walking, not elsewhere classified (R26.2)    Time: 1335-1400 PT Time Calculation (min) (ACUTE ONLY): 25 min   Charges:   PT Evaluation $PT Eval Low Complexity: 1 Low         Tayvin Preslar, PT 12/25/20, 2:29 PM

## 2020-12-25 NOTE — Progress Notes (Signed)
[place] for enoxaparin Indication: atrial fibrillation  Allergies  Allergen Reactions   Iodinated Diagnostic Agents Other (See Comments) and Nausea Only    Dry heaves, patient states he felt like he was on fire and about to pass out.  Pre-syncope, burning  Dry heaves, patient states he felt like he was on fire and about to pass out.    Lisinopril Swelling    Tongue swelling, neck pain and Headaches    Tramadol Other (See Comments) and Shortness Of Breath    Other reaction(s): Other (See Comments) Other Reaction: tachy Dizziness and unsteady gait.   Pregabalin Palpitations    Other reaction(s): Other (See Comments) Other Reaction: edema    Patient Measurements: Height: 6' (182.9 cm) Weight: 69.2 kg (152 lb 8.9 oz) IBW/kg (Calculated) : 77.6  Vital Signs: Temp: 97.7 F (36.5 C) (08/20 0400) Temp Source: Oral (08/20 0400) BP: 112/74 (08/20 0600) Pulse Rate: 67 (08/20 0600)  Labs: Recent Labs    12/23/20 1347 12/23/20 1635 12/24/20 0555 12/25/20 0624  HGB 12.4*  --  12.3* 12.6*  HCT 36.9*  --  36.6* 37.3*  PLT 185  --  158 174  CREATININE 2.07*  --  1.70* 1.57*  TROPONINIHS 21* 20*  --   --      Estimated Creatinine Clearance: 39.2 mL/min (A) (by C-G formula based on SCr of 1.57 mg/dL (H)).   Medical History: Past Medical History:  Diagnosis Date   Arthritis    COPD (chronic obstructive pulmonary disease) (HCC)    GERD (gastroesophageal reflux disease)    Headache    Orthostatic dizziness    PONV (postoperative nausea and vomiting)    Torn rotator cuff    left     Assessment: 75 year old male with acute hypoxic respiratory secondary to Covid pneumonitis. Patient was on BiPAP and transitioned to heated high flow. He has a history of atrial fibrillation on Xarelto PTA and currently on hold. Pharmacy consult for enoxaparin.    Plan:  Continue Enoxaparin 1 mg/kg (70 mg) BID. Monitor CBC approximately every 3 days  per protocol.   Noralee Space, PharmD Clinical Pharmacist 12/25/2020 11:08 AM

## 2020-12-26 LAB — GLUCOSE, CAPILLARY
Glucose-Capillary: 110 mg/dL — ABNORMAL HIGH (ref 70–99)
Glucose-Capillary: 136 mg/dL — ABNORMAL HIGH (ref 70–99)
Glucose-Capillary: 123 mg/dL — ABNORMAL HIGH (ref 70–99)
Glucose-Capillary: 182 mg/dL — ABNORMAL HIGH (ref 70–99)

## 2020-12-26 LAB — PHOSPHORUS: Phosphorus: 3.6 mg/dL (ref 2.5–4.6)

## 2020-12-26 LAB — CBC WITH DIFFERENTIAL/PLATELET
Abs Immature Granulocytes: 0.07 10*3/uL (ref 0.00–0.07)
Basophils Absolute: 0 10*3/uL (ref 0.0–0.1)
Basophils Relative: 0 %
Eosinophils Absolute: 0 10*3/uL (ref 0.0–0.5)
Eosinophils Relative: 0 %
HCT: 40.8 % (ref 39.0–52.0)
Hemoglobin: 14.2 g/dL (ref 13.0–17.0)
Immature Granulocytes: 1 %
Lymphocytes Relative: 5 %
Lymphs Abs: 0.5 10*3/uL — ABNORMAL LOW (ref 0.7–4.0)
MCH: 35.5 pg — ABNORMAL HIGH (ref 26.0–34.0)
MCHC: 34.8 g/dL (ref 30.0–36.0)
MCV: 102 fL — ABNORMAL HIGH (ref 80.0–100.0)
Monocytes Absolute: 0.3 10*3/uL (ref 0.1–1.0)
Monocytes Relative: 2 %
Neutro Abs: 9.8 10*3/uL — ABNORMAL HIGH (ref 1.7–7.7)
Neutrophils Relative %: 92 %
Platelets: 215 10*3/uL (ref 150–400)
RBC: 4 MIL/uL — ABNORMAL LOW (ref 4.22–5.81)
RDW: 13 % (ref 11.5–15.5)
WBC: 10.6 10*3/uL — ABNORMAL HIGH (ref 4.0–10.5)
nRBC: 0 % (ref 0.0–0.2)

## 2020-12-26 LAB — COMPREHENSIVE METABOLIC PANEL
ALT: 35 U/L (ref 0–44)
AST: 29 U/L (ref 15–41)
Albumin: 3 g/dL — ABNORMAL LOW (ref 3.5–5.0)
Alkaline Phosphatase: 34 U/L — ABNORMAL LOW (ref 38–126)
Anion gap: 11 (ref 5–15)
BUN: 53 mg/dL — ABNORMAL HIGH (ref 8–23)
CO2: 29 mmol/L (ref 22–32)
Calcium: 8.4 mg/dL — ABNORMAL LOW (ref 8.9–10.3)
Chloride: 99 mmol/L (ref 98–111)
Creatinine, Ser: 1.79 mg/dL — ABNORMAL HIGH (ref 0.61–1.24)
GFR, Estimated: 39 mL/min — ABNORMAL LOW (ref >60)
Glucose, Bld: 123 mg/dL — ABNORMAL HIGH (ref 70–99)
Potassium: 3.9 mmol/L (ref 3.5–5.1)
Sodium: 139 mmol/L (ref 135–145)
Total Bilirubin: 0.8 mg/dL (ref 0.3–1.2)
Total Protein: 6.3 g/dL — ABNORMAL LOW (ref 6.5–8.1)

## 2020-12-26 LAB — FERRITIN: Ferritin: 165 ng/mL (ref 24–336)

## 2020-12-26 LAB — EXPECTORATED SPUTUM ASSESSMENT W GRAM STAIN, RFLX TO RESP C

## 2020-12-26 LAB — BRAIN NATRIURETIC PEPTIDE: B Natriuretic Peptide: 471.3 pg/mL — ABNORMAL HIGH (ref 0.0–100.0)

## 2020-12-26 LAB — LEGIONELLA PNEUMOPHILA SEROGP 1 UR AG: L. pneumophila Serogp 1 Ur Ag: NEGATIVE

## 2020-12-26 LAB — C-REACTIVE PROTEIN: CRP: 7.5 mg/dL — ABNORMAL HIGH (ref <1.0)

## 2020-12-26 LAB — MAGNESIUM: Magnesium: 2.1 mg/dL (ref 1.7–2.4)

## 2020-12-26 LAB — D-DIMER, QUANTITATIVE: D-Dimer, Quant: 0.62 ug/mL-FEU — ABNORMAL HIGH (ref 0.00–0.50)

## 2020-12-26 MED ORDER — AMIODARONE LOAD VIA INFUSION
150.0000 mg | Freq: Once | INTRAVENOUS | Status: AC
  Administered 2020-12-26: 150 mg via INTRAVENOUS
  Filled 2020-12-26: qty 83.34

## 2020-12-26 MED ORDER — METHYLPREDNISOLONE SODIUM SUCC 40 MG IJ SOLR
40.0000 mg | Freq: Two times a day (BID) | INTRAMUSCULAR | Status: DC
  Administered 2020-12-26 – 2020-12-29 (×6): 40 mg via INTRAVENOUS
  Filled 2020-12-26 (×6): qty 1

## 2020-12-26 MED ORDER — AMIODARONE HCL IN DEXTROSE 360-4.14 MG/200ML-% IV SOLN
60.0000 mg/h | INTRAVENOUS | Status: AC
  Administered 2020-12-26: 60 mg/h via INTRAVENOUS
  Administered 2020-12-26: 59.94 mg/h via INTRAVENOUS
  Filled 2020-12-26 (×2): qty 200

## 2020-12-26 MED ORDER — DIGOXIN 0.25 MG/ML IJ SOLN
0.1250 mg | Freq: Four times a day (QID) | INTRAMUSCULAR | Status: AC
  Administered 2020-12-26: 0.125 mg via INTRAVENOUS
  Filled 2020-12-26: qty 2

## 2020-12-26 MED ORDER — AMIODARONE HCL IN DEXTROSE 360-4.14 MG/200ML-% IV SOLN
30.0000 mg/h | INTRAVENOUS | Status: DC
  Administered 2020-12-26 – 2020-12-28 (×4): 30 mg/h via INTRAVENOUS
  Filled 2020-12-26 (×3): qty 200

## 2020-12-26 NOTE — Progress Notes (Signed)
Patient's son brought in home medications for patient. Patient was reminded of the medication policy however, patient refused to check the medication in with pharmacy. This RN encouraged patient not to consume any home medication and patient verbally agreed. Patient's wife [name] call while this RN was in another patient's room. This RN called [name] back with no answer. Patient is in no distress.

## 2020-12-26 NOTE — Progress Notes (Signed)
PROGRESS NOTE                                                                                                                                                                                                             Patient Demographics:    Riley Martin, is a 75 y.o. male, DOB - 1946/03/02, ZGY:[id number]  Outpatient Primary MD for the patient is Riley Body, MD    LOS - 3  Admit date - 12/23/2020    Chief Complaint  Patient presents with   Shortness of Breath    SOB worsening this am, has chest discomfort for 2 weeks       Brief Narrative (HPI from H&P)  -  Riley Martin is a 75 y.o. male with a past medical history of COPD not on home oxygen, hyperlipidemia, A. fib on Xarelto, chronic kidney disease, GERD, former smoker.   The patient presents to the emergency department due to shortness of breath that has been going on for about a week and a half now.  He tested positive for COVID-19 at home on August 8.  He is unfortunately not vaccinated, he was diagnosed with severe acute hypoxic respiratory failure due to COVID-19 pneumonia and admitted to the hospital on BiPAP.   Subjective:   Patient in bed, appears comfortable, denies any headache, no fever, no chest pain or pressure, improved shortness of breath , no abdominal pain. No new focal weakness.    Assessment  & Plan :     Acute Hypoxic Resp. Failure due to Acute Covid 19 Viral Pneumonitis during the ongoing 2020 Covid 19 Pandemic - he fortunately not vaccinated and he has incurred severe parenchymal lung injury with severe hypoxia requiring BiPAP at the time of admission >> 45 L heated high flow, he was placed on IV steroids, remdesivir and given Actemra on 12/24/2020 after appropriate consent, he is still severely sick with grave prognosis however in the last 24 hours has made good recovery, he is currently down to 11 to 12 L nasal cannula oxygen, still feels  short of breath but much improved from the time of admission.  Continue monitoring on IV steroids, completed remdesivir course, for now continue empiric antibiotics for a total of 3 days, do not think there is a bacterial component to his presentation however due to underlying COPD will  complete short course of antibiotics.  PCCM following appreciate their input.  Encouraged the patient to sit up in chair in the daytime use I-S and flutter valve for pulmonary toiletry.  Will advance activity and titrate down oxygen as possible.   Recent Labs  Lab 12/23/20 1347 12/23/20 1354 12/24/20 0555 12/24/20 0903 12/25/20 0624 12/26/20 0658  WBC 9.3  --  8.9  --  6.8 10.6*  HGB 12.4*  --  12.3*  --  12.6* 14.2  HCT 36.9*  --  36.6*  --  37.3* 40.8  PLT 185  --  158  --  174 215  CRP  --   --  19.5*  --  12.4*  --   BNP 108.0*  --  88.4  --  635.8* 471.3*  DDIMER  --   --   --  1.23* 0.69* 0.62*  PROCALCITON 0.71  --   --   --   --   --   AST 38  --  33  --  26 29  ALT 23  --  22  --  22 35  ALKPHOS 35*  --  31*  --  28* 34*  BILITOT 0.7  --  0.6  --  0.7 0.8  ALBUMIN 3.4*  --  3.3*  --  2.7* 3.0*  SARSCOV2NAA  --  POSITIVE*  --   --   --   --     2.  COPD.  Could have had mild exacerbation, currently no wheezing continue steroids for #1 above.  3.  History of paroxysmal A. fib with [place] vas 2 score of greater than 3. Now on Lovenox, switch to Xarelto once he is better, Lopressor dose doubled - BP is the limiting factor, will give 2 doses of IV digoxin only on 12/26/2020 for better rate control, as needed IV Cardizem and IV Lopressor for rate control, if rate becomes a challenge will start on IV Cardizem drip if blood pressure low then IV amiodarone. TTE stable.  4.  BPH.  On Flomax continue.  5.  GERD.  On PPI.  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  7. Chronic Pain - patient on ++ dose of narcotics at baseline, says he goes into withdrawals if he does not take them, home  medications ordered with as needed Narcan.  Advised not to overuse.   Currently on insulin for glycemic control due to high dose steroid exposure.  CBG (last 3)  Recent Labs    12/25/20 1547 12/25/20 2109 12/26/20 0746  GLUCAP 176* 116* 110*         Condition - Extremely Guarded  Family Communication  :  Wife Riley Martin [phone] on 12/24/20 - clearly told grave prognosis  Code Status :  Full  Consults  :  PCCM  PUD Prophylaxis :  PPI   Procedures  :     TTE -  1. Left ventricular ejection fraction, by estimation, is 60 to 65%. The left ventricle has normal function. The left ventricle has no regional wall motion abnormalities. Left ventricular diastolic parameters are indeterminate.  2. Right ventricular systolic function is normal. The right ventricular size is normal. Tricuspid regurgitation signal is inadequate for assessing PA pressure.  3. The mitral valve is normal in structure. Mild mitral valve regurgitation.  4. The aortic valve was not well visualized. Aortic valve regurgitation is not visualized. Mild to moderate aortic valve sclerosis/calcification is present, unable to exclude mild stenosis, gradient was not  measured      Disposition Plan  :    Status is: Inpatient  Remains inpatient appropriate because:IV treatments appropriate due to intensity of illness or inability to take PO  Dispo: The patient is from: Home              Anticipated d/c is to: Home              Patient currently is not medically stable to d/c.   Difficult to place patient No  DVT Prophylaxis  : Lovenox.     Lab Results  Component Value Date   PLT 215 12/26/2020    Diet :  Diet Order             DIET SOFT Room service appropriate? Yes; Fluid consistency: Thin  Diet effective now                    Inpatient Medications  Scheduled Meds:  vitamin C  500 mg Oral Daily   chlorhexidine  15 mL Mouth Rinse BID   Chlorhexidine Gluconate Cloth  6 each Topical Q0600    dextromethorphan-guaiFENesin  1 tablet Oral BID   digoxin  0.125 mg Intravenous Q6H   enoxaparin (LOVENOX) injection  1 mg/kg Subcutaneous BID   feeding supplement (NEPRO CARB STEADY)  237 mL Oral TID BM   fluticasone  2 spray Each Nare Daily   insulin aspart  0-9 Units Subcutaneous TID WC   insulin detemir  0.075 Units/kg Subcutaneous BID   mouth rinse  15 mL Mouth Rinse q12n4p   methylPREDNISolone (SOLU-MEDROL) injection  60 mg Intravenous Q12H   metoprolol tartrate  50 mg Oral BID   mirtazapine  15 mg Oral QHS   mometasone-formoterol  2 puff Inhalation BID   multivitamin with minerals  1 tablet Oral Daily   oxyCODONE  10 mg Oral TID   pantoprazole  20 mg Oral Daily   tamsulosin  0.4 mg Oral Daily   tiotropium  18 mcg Inhalation Daily   zinc sulfate  220 mg Oral Daily   Continuous Infusions:  cefTRIAXone (ROCEPHIN)  IV 1 g (12/25/20 1522)   promethazine (PHENERGAN) injection (IM or IVPB)     remdesivir 100 mg in NS 100 mL 100 mg (12/26/20 0827)   PRN Meds:.acetaminophen, albuterol, diltiazem, diphenhydrAMINE, metoprolol tartrate, naLOXone (NARCAN)  injection, [DISCONTINUED] ondansetron **OR** ondansetron (ZOFRAN) IV, oxyCODONE-acetaminophen **AND** oxyCODONE, polyethylene glycol, promethazine (PHENERGAN) injection (IM or IVPB)  Antibiotics  :    Anti-infectives (From admission, onward)    Start     Dose/Rate Route Frequency Ordered Stop   12/24/20 1500  cefTRIAXone (ROCEPHIN) 1 g in sodium chloride 0.9 % 100 mL IVPB        1 g 200 mL/hr over 30 Minutes Intravenous Every 24 hours 12/23/20 1533 12/28/20 1459   12/24/20 1500  azithromycin (ZITHROMAX) 500 mg in sodium chloride 0.9 % 250 mL IVPB        500 mg 250 mL/hr over 60 Minutes Intravenous Every 24 hours 12/23/20 1533 12/25/20 1619   12/24/20 1000  remdesivir 100 mg in sodium chloride 0.9 % 100 mL IVPB       See Hyperspace for full Linked Orders Report.   100 mg 200 mL/hr over 30 Minutes Intravenous Daily 12/23/20 1529  12/28/20 0959   12/23/20 1630  remdesivir 200 mg in sodium chloride 0.9% 250 mL IVPB       See Hyperspace for full Linked Orders Report.   200 mg  580 mL/hr over 30 Minutes Intravenous Once 12/23/20 1529 12/23/20 1803   12/23/20 1500  cefTRIAXone (ROCEPHIN) 1 g in sodium chloride 0.9 % 100 mL IVPB        1 g 200 mL/hr over 30 Minutes Intravenous  Once 12/23/20 1447 12/23/20 1611   12/23/20 1500  azithromycin (ZITHROMAX) 500 mg in sodium chloride 0.9 % 250 mL IVPB        500 mg 250 mL/hr over 60 Minutes Intravenous  Once 12/23/20 1447 12/23/20 1638        Time Spent in minutes  30   Lala Lund M.D on 12/26/2020 at 8:52 AM  To page go to [email or web address]   [facility] -  Office  [phone]    See all Orders from today for further details    Objective:   Vitals:   12/26/20 0600 12/26/20 0605 12/26/20 0607 12/26/20 0646  BP: 129/82     Pulse: (!) 155 91 (!) 115 77  Resp: 20 19 (!) 22 10  Temp:      TempSrc:      SpO2: 93% 97% 94% 97%  Weight:      Height:        Wt Readings from Last 3 Encounters:  12/23/20 69.2 kg  07/11/20 65.8 kg  06/23/20 60.8 kg     Intake/Output Summary (Last 24 hours) at 12/26/2020 0852 Last data filed at 12/26/2020 0600 Gross per 24 hour  Intake 500 ml  Output 1726 ml  Net -1226 ml     Physical Exam  Awake Alert, No new F.N deficits, Normal affect [place].AT,PERRAL Supple Neck,No JVD, No cervical lymphadenopathy appriciated.  Symmetrical Chest wall movement, Good air movement bilaterally, few crackles RRR,No Gallops, Rubs or new Murmurs, No Parasternal Heave +ve B.Sounds, Abd Soft, No tenderness, No organomegaly appriciated, No rebound - guarding or rigidity. No Cyanosis, Clubbing or edema, No new Rash or bruise    Data Review:    CBC Recent Labs  Lab 12/23/20 1347 12/24/20 0555 12/25/20 0624 12/26/20 0658  WBC 9.3 8.9 6.8 10.6*  HGB 12.4* 12.3* 12.6* 14.2  HCT 36.9* 36.6* 37.3* 40.8  PLT 185 158 174 215  MCV  99.7 101.7* 100.8* 102.0*  MCH 33.5 34.2* 34.1* 35.5*  MCHC 33.6 33.6 33.8 34.8  RDW 13.2 13.2 13.0 13.0  LYMPHSABS 0.6* 0.3* 0.3* 0.5*  MONOABS 0.2 0.3 0.2 0.3  EOSABS 0.0 0.0 0.0 0.0  BASOSABS 0.0 0.0 0.0 0.0    Recent Labs  Lab 12/23/20 1347 12/24/20 0555 12/24/20 0903 12/25/20 0624 12/26/20 0658  NA 133* 136  --  135 139  K 4.4 5.0  --  4.3 3.9  CL 101 101  --  101 99  CO2 23 28  --  24 29  GLUCOSE 104* 97  --  169* 123*  BUN 47* 38*  --  39* 53*  CREATININE 2.07* 1.70*  --  1.57* 1.79*  CALCIUM 8.2* 8.5*  --  8.1* 8.4*  AST 38 33  --  26 29  ALT 23 22  --  22 35  ALKPHOS 35* 31*  --  28* 34*  BILITOT 0.7 0.6  --  0.7 0.8  ALBUMIN 3.4* 3.3*  --  2.7* 3.0*  MG  --  2.1  --  2.1 2.1  CRP  --  19.5*  --  12.4*  --   DDIMER  --   --  1.23* 0.69* 0.62*  PROCALCITON 0.71  --   --   --   --  BNP 108.0* 88.4  --  635.8* 471.3*    ------------------------------------------------------------------------------------------------------------------ No results for input(s): CHOL, HDL, LDLCALC, TRIG, CHOLHDL, LDLDIRECT in the last 72 hours.  No results found for: HGBA1C ------------------------------------------------------------------------------------------------------------------ No results for input(s): TSH, T4TOTAL, T3FREE, THYROIDAB in the last 72 hours.  Invalid input(s): FREET3  Cardiac Enzymes No results for input(s): CKMB, TROPONINI, MYOGLOBIN in the last 168 hours.  Invalid input(s): CK ------------------------------------------------------------------------------------------------------------------    Component Value Date/Time   BNP 471.3 (H) 12/26/2020 0658     Radiology Reports DG Chest Port 1 View  Result Date: 12/25/2020 CLINICAL DATA:  Continued shortness of breath.  COVID. EXAM: PORTABLE CHEST 1 VIEW COMPARISON:  Yesterday FINDINGS: Stable infiltrates at the lung bases. Apical emphysematous markings. Normal heart size and mediastinal contours. No  visible effusion or pneumothorax. IMPRESSION: Stable lower lobe infiltrates. Electronically Signed   By: Monte Fantasia M.D.   On: 12/25/2020 07:55   DG Chest Port 1 View  Result Date: 12/24/2020 CLINICAL DATA:  COVID and shortness of breath EXAM: PORTABLE CHEST 1 VIEW COMPARISON:  Yesterday FINDINGS: Reticular opacities at the bases which are increased. No fibrosis on a February 2022 chest CT. No edema, effusion, or pneumothorax. Normal heart size and mediastinal contours. IMPRESSION: Increased infiltrates at the lung bases. Electronically Signed   By: Monte Fantasia M.D.   On: 12/24/2020 07:37   DG Chest Portable 1 View  Result Date: 12/23/2020 CLINICAL DATA:  Shortness of breath EXAM: PORTABLE CHEST 1 VIEW COMPARISON:  Radiograph 06/21/2020, chest CT 06/21/2020 FINDINGS: Unchanged cardiomediastinal silhouette. There is unchanged apical predominant emphysema. There are bibasilar opacities. No large pleural effusion or visible pneumothorax. Bilateral shoulder degenerative changes. No acute osseous abnormality. Partially visualized cervical spine fusion hardware. IMPRESSION: Bibasilar opacities which could represent an infectious inflammatory process. Emphysema. Electronically Signed   By: Maurine Simmering M.D.   On: 12/23/2020 14:34   DG Abd Portable 1V  Result Date: 12/24/2020 CLINICAL DATA:  Nausea and vomiting EXAM: PORTABLE ABDOMEN - 1 VIEW COMPARISON:  11/17/2019 FINDINGS: 2 supine frontal views of the abdomen and pelvis are obtained. Portions of the right flank and right hemidiaphragm are excluded by collimation. The bowel gas pattern is unremarkable without obstruction or ileus. No masses or abnormal calcifications. Patchy bibasilar airspace disease again noted, not appreciably changed since preceding chest x-ray. IMPRESSION: 1. Unremarkable bowel gas pattern. 2. Bibasilar airspace disease unchanged. Electronically Signed   By: Randa Ngo M.D.   On: 12/24/2020 16:22   ECHOCARDIOGRAM  COMPLETE  Result Date: 12/25/2020    ECHOCARDIOGRAM REPORT   Patient Name:   [facility] [place] Date of Exam: 12/25/2020 Medical Rec #:  [id number]          Height:       72.0 in Accession #:    [id number]         Weight:       152.6 lb Date of Birth:  1945-11-24          BSA:          1.899 m Patient Age:    23 years           BP:           112/74 mmHg Patient Gender: M                  HR:           112 bpm. Exam Location:  [facility] Procedure: 2D Echo Indications:     Acute Respiratory  Distress R06.03  History:         Patient has prior history of Echocardiogram examinations, most                  recent 06/21/2020.  Sonographer:     Kathlen Brunswick RDCS Referring Phys:  [id number] Flora Lipps Diagnosing Phys: Ida Rogue MD IMPRESSIONS  1. Left ventricular ejection fraction, by estimation, is 60 to 65%. The left ventricle has normal function. The left ventricle has no regional wall motion abnormalities. Left ventricular diastolic parameters are indeterminate.  2. Right ventricular systolic function is normal. The right ventricular size is normal. Tricuspid regurgitation signal is inadequate for assessing PA pressure.  3. The mitral valve is normal in structure. Mild mitral valve regurgitation.  4. The aortic valve was not well visualized. Aortic valve regurgitation is not visualized. Mild to moderate aortic valve sclerosis/calcification is present, unable to exclude mild stenosis, gradient was not measured. FINDINGS  Left Ventricle: Left ventricular ejection fraction, by estimation, is 60 to 65%. The left ventricle has normal function. The left ventricle has no regional wall motion abnormalities. The left ventricular internal cavity size was normal in size. There is  no left ventricular hypertrophy. Left ventricular diastolic parameters are indeterminate. Right Ventricle: The right ventricular size is normal. No increase in right ventricular wall thickness. Right ventricular systolic function is normal. Tricuspid  regurgitation signal is inadequate for assessing PA pressure. Left Atrium: Left atrial size was normal in size. Right Atrium: Right atrial size was normal in size. Pericardium: There is no evidence of pericardial effusion. Mitral Valve: The mitral valve is normal in structure. Mild mitral valve regurgitation. No evidence of mitral valve stenosis. Tricuspid Valve: The tricuspid valve is normal in structure. Tricuspid valve regurgitation is mild . No evidence of tricuspid stenosis. Aortic Valve: The aortic valve was not well visualized. Aortic valve regurgitation is not visualized. Mild to moderate aortic valve sclerosis/calcification is present, unable to exclude mild stenosis, gradient was not measured.  Pulmonic Valve: The pulmonic valve was normal in structure. Pulmonic valve regurgitation is not visualized. No evidence of pulmonic stenosis. Aorta: The aortic root is normal in size and structure. Venous: The pulmonary veins were not well visualized. The inferior vena cava was not well visualized. The inferior vena cava is normal in size with greater than 50% respiratory variability, suggesting right atrial pressure of 3 mmHg. IAS/Shunts: No atrial level shunt detected by color flow Doppler.  LEFT VENTRICLE PLAX 2D LVIDd:         4.31 cm  Diastology LVIDs:         3.07 cm  LV e' medial:    12.10 cm/s LV PW:         1.33 cm  LV E/e' medial:  6.2 LV IVS:        1.10 cm  LV e' lateral:   15.00 cm/s LVOT diam:     1.80 cm  LV E/e' lateral: 5.0 LV SV:         29 LV SV Index:   15 LVOT Area:     2.54 cm  RIGHT VENTRICLE RV Basal diam:  3.64 cm RV S prime:     19.70 cm/s TAPSE (M-mode): 1.9 cm LEFT ATRIUM             Index       RIGHT ATRIUM           Index LA diam:        3.00 cm 1.58  cm/m  RA Area:     15.20 cm LA Vol (A2C):   29.5 ml 15.53 ml/m RA Volume:   41.20 ml  21.70 ml/m LA Vol (A4C):   26.9 ml 14.17 ml/m LA Biplane Vol: 29.9 ml 15.75 ml/m  AORTIC VALVE             PULMONIC VALVE LVOT Vmax:   65.90 cm/s   PV Vmax:       1.11 m/s LVOT Vmean:  45.100 cm/s PV Peak grad:  4.9 mmHg LVOT VTI:    0.114 m  AORTA Ao Root diam: 3.20 cm Ao Asc diam:  3.40 cm MITRAL VALVE               TRICUSPID VALVE MV Area (PHT): 4.86 cm    TV Peak grad:   15.5 mmHg MV Decel Time: 156 msec    TV Vmax:        1.97 m/s MV E velocity: 75.40 cm/s                            SHUNTS                            Systemic VTI:  0.11 m                            Systemic Diam: 1.80 cm Ida Rogue MD Electronically signed by Ida Rogue MD Signature Date/Time: 12/25/2020/12:36:16 PM    Final

## 2020-12-26 NOTE — Progress Notes (Signed)
[place] for enoxaparin Indication: atrial fibrillation  Allergies  Allergen Reactions   Iodinated Diagnostic Agents Other (See Comments) and Nausea Only    Dry heaves, patient states he felt like he was on fire and about to pass out.  Pre-syncope, burning  Dry heaves, patient states he felt like he was on fire and about to pass out.    Lisinopril Swelling    Tongue swelling, neck pain and Headaches    Tramadol Other (See Comments) and Shortness Of Breath    Other reaction(s): Other (See Comments) Other Reaction: tachy Dizziness and unsteady gait.   Pregabalin Palpitations    Other reaction(s): Other (See Comments) Other Reaction: edema    Patient Measurements: Height: 6' (182.9 cm) Weight: 69.2 kg (152 lb 8.9 oz) IBW/kg (Calculated) : 77.6  Vital Signs: Temp: 97.5 F (36.4 C) (08/21 0400) Temp Source: Oral (08/21 0400) BP: 129/82 (08/21 0600) Pulse Rate: 77 (08/21 0646)  Labs: Recent Labs    12/23/20 1347 12/23/20 1635 12/24/20 0555 12/25/20 0624 12/26/20 0658  HGB 12.4*  --  12.3* 12.6* 14.2  HCT 36.9*  --  36.6* 37.3* 40.8  PLT 185  --  158 174 215  CREATININE 2.07*  --  1.70* 1.57* 1.79*  TROPONINIHS 21* 20*  --   --   --      Estimated Creatinine Clearance: 34.4 mL/min (A) (by C-G formula based on SCr of 1.79 mg/dL (H)).   Medical History: Past Medical History:  Diagnosis Date   Arthritis    COPD (chronic obstructive pulmonary disease) (HCC)    GERD (gastroesophageal reflux disease)    Headache    Orthostatic dizziness    PONV (postoperative nausea and vomiting)    Torn rotator cuff    left     Assessment: 75 year old male with acute hypoxic respiratory secondary to Covid pneumonitis. Patient was on BiPAP and transitioned to heated high flow. He has a history of atrial fibrillation on Xarelto PTA and currently on hold. Pharmacy consult for enoxaparin.    Plan:  Continue Enoxaparin 1 mg/kg (70 mg) BID.   Wt=69.2 kg Monitor CBC approximately every 3 days per protocol.   Noralee Space, PharmD Clinical Pharmacist 12/26/2020 8:52 AM

## 2020-12-26 NOTE — Consult Note (Signed)
[name]  CARDIOLOGY CONSULT NOTE  Patient ID: Riley Martin MRN: [id number] DOB/AGE: January 01, 1946 75 y.o.  Admit date: 12/23/2020 Referring Physician Candiss Norse Primary Physician [place] Primary Cardiologist Bill Yohn Reason for Consultation atrial fibrillation  HPI: 75 year old gentleman referred for atrial fibrillation with rapid ventricular rate.  The patient was admitted 12/23/2020 with COVID-19 pneumonia, with underlying COPD, treated with BiPAP, broad-spectrum antibiotics, remdesivir, steroids, and multiple inhaler therapy.  The patient has been in atrial fibrillation with rapid ventricular rate.  He has a history of paroxysmal atrial fibrillation on Xarelto for stroke prevention and metoprolol to tartrate for rate and rhythm control.  Xarelto has been held and the patient is on Lovenox.  Metoprolol tartrate has been uptitrated to 50 mg twice daily.  Heart rate remains 110 to 120 bpm.  Patient currently is nauseated and having difficulty eating.  Review of systems complete and found to be negative unless listed above     Past Medical History:  Diagnosis Date   Arthritis    COPD (chronic obstructive pulmonary disease) (HCC)    GERD (gastroesophageal reflux disease)    Headache    Orthostatic dizziness    PONV (postoperative nausea and vomiting)    Torn rotator cuff    left    Past Surgical History:  Procedure Laterality Date   COLONOSCOPY     EXCISION CHONCHA BULLOSA Bilateral 06/02/2015   Procedure: BILATERAL CHONCHA BULLOSA;  Surgeon: Carloyn Manner, MD;  Location: [place];  Service: ENT;  Laterality: Bilateral;   MAXILLARY ANTROSTOMY Bilateral 06/02/2015   Procedure: MAXILLARY ANTROSTOMY;  Surgeon: Carloyn Manner, MD;  Location: [place];  Service: ENT;  Laterality: Bilateral;   [place]  2008   no limitations    Medications Prior to Admission  Medication Sig Dispense Refill Last Dose   albuterol (PROVENTIL)  (2.5 MG/3ML) 0.083% nebulizer solution Take 3 mLs by nebulization every 6 (six) hours as needed for wheezing or shortness of breath.   12/23/2020 at 0915   fluticasone (FLONASE) 50 MCG/ACT nasal spray SPRAY 2 SPRAYS INTO EACH NOSTRIL EVERY DAY 16 g 0 12/23/2020 at 0900   metoprolol tartrate (LOPRESSOR) 25 MG tablet Take 25 mg by mouth 2 (two) times daily.   12/23/2020 at 0900   oxyCODONE-acetaminophen (PERCOCET) 10-325 MG tablet Take 1 tablet by mouth 5 (five) times daily as needed.   12/23/2020 at 0900   OXYCONTIN 15 MG 12 hr tablet Take 15 mg by mouth every 8 (eight) hours.   12/23/2020 at 0900   pantoprazole (PROTONIX) 20 MG tablet Take 20 mg by mouth daily.   12/22/2020 at prn   tamsulosin (FLOMAX) 0.4 MG CAPS capsule Take 0.4 mg by mouth daily.   12/22/2020 at 1800   VENTOLIN HFA 108 (90 Base) MCG/ACT inhaler Inhale 2 puffs into the lungs every 6 (six) hours as needed.   12/23/2020 at 0900   XARELTO 20 MG TABS tablet Take 20 mg by mouth daily.   12/22/2020 at 1800   megestrol (MEGACE ES) 625 MG/5ML suspension Take 625 mg by mouth daily.   unknown at prn   mirtazapine (REMERON) 15 MG tablet Take 15 mg by mouth at bedtime.   unknown at prn   OXYCONTIN 10 MG 12 hr tablet Take 10 mg by mouth 3 (three) times daily.      Social History   Socioeconomic History   Marital status: Married    Spouse name: Not on file   Number  of children: Not on file   Years of education: Not on file   Highest education level: Not on file  Occupational History   Not on file  Tobacco Use   Smoking status: Every Day    Packs/day: 0.50    Years: 35.00    Pack years: 17.50    Types: Cigarettes   Smokeless tobacco: Never  Vaping Use   Vaping Use: Never used  Substance and Sexual Activity   Alcohol use: No   Drug use: No   Sexual activity: Not on file  Other Topics Concern   Not on file  Social History Narrative   Not on file   Social Determinants of Health   Financial Resource Strain: Not on file  Food  Insecurity: Not on file  Transportation Needs: Not on file  Physical Activity: Not on file  Stress: Not on file  Social Connections: Not on file  Intimate Partner Violence: Not on file    No family history on file.    Review of systems complete and found to be negative unless listed above      PHYSICAL EXAM  General: Well developed, well nourished, in no acute distress HEENT:  Normocephalic and atramatic Neck:  No JVD.  Lungs: Clear bilaterally to auscultation and percussion. Heart: HRRR . Normal S1 and S2 without gallops or murmurs.  Abdomen: Bowel sounds are positive, abdomen soft and non-tender  Msk:  Back normal, normal gait. Normal strength and tone for age. Extremities: No clubbing, cyanosis or edema.   Neuro: Alert and oriented X 3. Psych:  Good affect, responds appropriately  Labs:   Lab Results  Component Value Date   WBC 10.6 (H) 12/26/2020   HGB 14.2 12/26/2020   HCT 40.8 12/26/2020   MCV 102.0 (H) 12/26/2020   PLT 215 12/26/2020    Recent Labs  Lab 12/26/20 0658  NA 139  K 3.9  CL 99  CO2 29  BUN 53*  CREATININE 1.79*  CALCIUM 8.4*  PROT 6.3*  BILITOT 0.8  ALKPHOS 34*  ALT 35  AST 29  GLUCOSE 123*   Lab Results  Component Value Date   CKTOTAL 59 06/14/2012   CKMB 1.4 06/14/2012   TROPONINI < 0.02 06/14/2012   No results found for: CHOL No results found for: HDL No results found for: LDLCALC No results found for: TRIG No results found for: CHOLHDL No results found for: LDLDIRECT    Radiology: [facility] Chest Port 1 View  Result Date: 12/25/2020 CLINICAL DATA:  Continued shortness of breath.  COVID. EXAM: PORTABLE CHEST 1 VIEW COMPARISON:  Yesterday FINDINGS: Stable infiltrates at the lung bases. Apical emphysematous markings. Normal heart size and mediastinal contours. No visible effusion or pneumothorax. IMPRESSION: Stable lower lobe infiltrates. Electronically Signed   By: Monte Fantasia M.D.   On: 12/25/2020 07:55   DG Chest Port 1  View  Result Date: 12/24/2020 CLINICAL DATA:  COVID and shortness of breath EXAM: PORTABLE CHEST 1 VIEW COMPARISON:  Yesterday FINDINGS: Reticular opacities at the bases which are increased. No fibrosis on a February 2022 chest CT. No edema, effusion, or pneumothorax. Normal heart size and mediastinal contours. IMPRESSION: Increased infiltrates at the lung bases. Electronically Signed   By: Monte Fantasia M.D.   On: 12/24/2020 07:37   DG Chest Portable 1 View  Result Date: 12/23/2020 CLINICAL DATA:  Shortness of breath EXAM: PORTABLE CHEST 1 VIEW COMPARISON:  Radiograph 06/21/2020, chest CT 06/21/2020 FINDINGS: Unchanged cardiomediastinal silhouette. There is unchanged apical  predominant emphysema. There are bibasilar opacities. No large pleural effusion or visible pneumothorax. Bilateral shoulder degenerative changes. No acute osseous abnormality. Partially visualized cervical spine fusion hardware. IMPRESSION: Bibasilar opacities which could represent an infectious inflammatory process. Emphysema. Electronically Signed   By: Maurine Simmering M.D.   On: 12/23/2020 14:34   DG Abd Portable 1V  Result Date: 12/24/2020 CLINICAL DATA:  Nausea and vomiting EXAM: PORTABLE ABDOMEN - 1 VIEW COMPARISON:  11/17/2019 FINDINGS: 2 supine frontal views of the abdomen and pelvis are obtained. Portions of the right flank and right hemidiaphragm are excluded by collimation. The bowel gas pattern is unremarkable without obstruction or ileus. No masses or abnormal calcifications. Patchy bibasilar airspace disease again noted, not appreciably changed since preceding chest x-ray. IMPRESSION: 1. Unremarkable bowel gas pattern. 2. Bibasilar airspace disease unchanged. Electronically Signed   By: Randa Ngo M.D.   On: 12/24/2020 16:22   ECHOCARDIOGRAM COMPLETE  Result Date: 12/25/2020    ECHOCARDIOGRAM REPORT   Patient Name:   [facility] Date of Exam: 12/25/2020 Medical Rec #:  [id number]          Height:       72.0 in  Accession #:    [id number]         Weight:       152.6 lb Date of Birth:  [birth year]          BSA:          1.899 m Patient Age:    75 years           BP:           112/74 mmHg Patient Gender: M                  HR:           112 bpm. Exam Location:  [facility] Procedure: 2D Echo Indications:     Acute Respiratory Distress R06.03  History:         Patient has prior history of Echocardiogram examinations, most                  recent 06/21/2020.  Sonographer:     Kathlen Brunswick RDCS Referring Phys:  [id number] Flora Lipps Diagnosing Phys: Ida Rogue MD IMPRESSIONS  1. Left ventricular ejection fraction, by estimation, is 60 to 65%. The left ventricle has normal function. The left ventricle has no regional wall motion abnormalities. Left ventricular diastolic parameters are indeterminate.  2. Right ventricular systolic function is normal. The right ventricular size is normal. Tricuspid regurgitation signal is inadequate for assessing PA pressure.  3. The mitral valve is normal in structure. Mild mitral valve regurgitation.  4. The aortic valve was not well visualized. Aortic valve regurgitation is not visualized. Mild to moderate aortic valve sclerosis/calcification is present, unable to exclude mild stenosis, gradient was not measured. FINDINGS  Left Ventricle: Left ventricular ejection fraction, by estimation, is 60 to 65%. The left ventricle has normal function. The left ventricle has no regional wall motion abnormalities. The left ventricular internal cavity size was normal in size. There is  no left ventricular hypertrophy. Left ventricular diastolic parameters are indeterminate. Right Ventricle: The right ventricular size is normal. No increase in right ventricular wall thickness. Right ventricular systolic function is normal. Tricuspid regurgitation signal is inadequate for assessing PA pressure. Left Atrium: Left atrial size was normal in size. Right Atrium: Right atrial size was normal in size. Pericardium: There  is no evidence of pericardial effusion.  Mitral Valve: The mitral valve is normal in structure. Mild mitral valve regurgitation. No evidence of mitral valve stenosis. Tricuspid Valve: The tricuspid valve is normal in structure. Tricuspid valve regurgitation is mild . No evidence of tricuspid stenosis. Aortic Valve: The aortic valve was not well visualized. Aortic valve regurgitation is not visualized. Mild to moderate aortic valve sclerosis/calcification is present, unable to exclude mild stenosis, gradient was not measured.  Pulmonic Valve: The pulmonic valve was normal in structure. Pulmonic valve regurgitation is not visualized. No evidence of pulmonic stenosis. Aorta: The aortic root is normal in size and structure. Venous: The pulmonary veins were not well visualized. The inferior vena cava was not well visualized. The inferior vena cava is normal in size with greater than 50% respiratory variability, suggesting right atrial pressure of 3 mmHg. IAS/Shunts: No atrial level shunt detected by color flow Doppler.  LEFT VENTRICLE PLAX 2D LVIDd:         4.31 cm  Diastology LVIDs:         3.07 cm  LV e' medial:    12.10 cm/s LV PW:         1.33 cm  LV E/e' medial:  6.2 LV IVS:        1.10 cm  LV e' lateral:   15.00 cm/s LVOT diam:     1.80 cm  LV E/e' lateral: 5.0 LV SV:         29 LV SV Index:   15 LVOT Area:     2.54 cm  RIGHT VENTRICLE RV Basal diam:  3.64 cm RV S prime:     19.70 cm/s TAPSE (M-mode): 1.9 cm LEFT ATRIUM             Index       RIGHT ATRIUM           Index LA diam:        3.00 cm 1.58 cm/m  RA Area:     15.20 cm LA Vol (A2C):   29.5 ml 15.53 ml/m RA Volume:   41.20 ml  21.70 ml/m LA Vol (A4C):   26.9 ml 14.17 ml/m LA Biplane Vol: 29.9 ml 15.75 ml/m  AORTIC VALVE             PULMONIC VALVE LVOT Vmax:   65.90 cm/s  PV Vmax:       1.11 m/s LVOT Vmean:  45.100 cm/s PV Peak grad:  4.9 mmHg LVOT VTI:    0.114 m  AORTA Ao Root diam: 3.20 cm Ao Asc diam:  3.40 cm MITRAL VALVE               TRICUSPID  VALVE MV Area (PHT): 4.86 cm    TV Peak grad:   15.5 mmHg MV Decel Time: 156 msec    TV Vmax:        1.97 m/s MV E velocity: 75.40 cm/s                            SHUNTS                            Systemic VTI:  0.11 m                            Systemic Diam: 1.80 cm Ida Rogue MD Electronically signed by Ida Rogue MD Signature Date/Time: 12/25/2020/12:36:16  PM    Final     EKG: Sinus tachycardia with incomplete right bundle branch block  ASSESSMENT AND PLAN:   1.  Atrial fibrillation with rapid ventricular rate, exacerbated by COVID-19 pneumonia and nausea, currently on Lovenox for stroke prevention and metoprolol tartrate uptitrated to 50 mg twice daily for rate control.  Patient on Xarelto for stroke prevention at home.  Blood pressure currently low normal. 2.  Respiratory failure, secondary to COVID-19 viral pneumonitis, on BiPAP, broad-spectrum antibiotics, remdesivir, and IV steroids  Recommendations  1.  Agree with overall current therapy 2.  Resume Xarelto once patient stabilized, able to take p.o.'s without nausea 3.  Continue metoprolol to tartrate 50 mg twice daily 4.  Agree with trial of digoxin 0.125 mg every 6 hours x 2  Signed: Isaias Cowman MD,PhD, [facility] 12/26/2020, 10:48 AM

## 2020-12-26 NOTE — Progress Notes (Signed)
Patient high risk for readmission.PT and OT recommending Home Health and 3 in 1. TOC also consulted for Eliquis coupon.  Patient is on airborne precautions and HFNC. Attempted call to spouse, left a VM requesting a return call.   Provided Eliquis coupon for RN to provide to patient/family.  Riley Martin, [place]

## 2020-12-27 ENCOUNTER — Inpatient Hospital Stay: Payer: BC Managed Care – PPO

## 2020-12-27 LAB — CBC WITH DIFFERENTIAL/PLATELET
Abs Immature Granulocytes: 0.06 10*3/uL (ref 0.00–0.07)
Basophils Absolute: 0 10*3/uL (ref 0.0–0.1)
Basophils Relative: 0 %
Eosinophils Absolute: 0 10*3/uL (ref 0.0–0.5)
Eosinophils Relative: 0 %
HCT: 38.4 % — ABNORMAL LOW (ref 39.0–52.0)
Hemoglobin: 13.2 g/dL (ref 13.0–17.0)
Immature Granulocytes: 1 %
Lymphocytes Relative: 6 %
Lymphs Abs: 0.6 10*3/uL — ABNORMAL LOW (ref 0.7–4.0)
MCH: 34.7 pg — ABNORMAL HIGH (ref 26.0–34.0)
MCHC: 34.4 g/dL (ref 30.0–36.0)
MCV: 101.1 fL — ABNORMAL HIGH (ref 80.0–100.0)
Monocytes Absolute: 0.4 10*3/uL (ref 0.1–1.0)
Monocytes Relative: 4 %
Neutro Abs: 9.4 10*3/uL — ABNORMAL HIGH (ref 1.7–7.7)
Neutrophils Relative %: 89 %
Platelets: 185 10*3/uL (ref 150–400)
RBC: 3.8 MIL/uL — ABNORMAL LOW (ref 4.22–5.81)
RDW: 12.9 % (ref 11.5–15.5)
WBC: 10.5 10*3/uL (ref 4.0–10.5)
nRBC: 0 % (ref 0.0–0.2)

## 2020-12-27 LAB — COMPREHENSIVE METABOLIC PANEL
ALT: 32 U/L (ref 0–44)
AST: 28 U/L (ref 15–41)
Albumin: 2.8 g/dL — ABNORMAL LOW (ref 3.5–5.0)
Alkaline Phosphatase: 34 U/L — ABNORMAL LOW (ref 38–126)
Anion gap: 8 (ref 5–15)
BUN: 65 mg/dL — ABNORMAL HIGH (ref 8–23)
CO2: 26 mmol/L (ref 22–32)
Calcium: 7.8 mg/dL — ABNORMAL LOW (ref 8.9–10.3)
Chloride: 103 mmol/L (ref 98–111)
Creatinine, Ser: 1.81 mg/dL — ABNORMAL HIGH (ref 0.61–1.24)
GFR, Estimated: 38 mL/min — ABNORMAL LOW (ref >60)
Glucose, Bld: 144 mg/dL — ABNORMAL HIGH (ref 70–99)
Potassium: 4.3 mmol/L (ref 3.5–5.1)
Sodium: 137 mmol/L (ref 135–145)
Total Bilirubin: 0.7 mg/dL (ref 0.3–1.2)
Total Protein: 5.8 g/dL — ABNORMAL LOW (ref 6.5–8.1)

## 2020-12-27 LAB — D-DIMER, QUANTITATIVE: D-Dimer, Quant: 0.51 ug/mL-FEU — ABNORMAL HIGH (ref 0.00–0.50)

## 2020-12-27 LAB — GLUCOSE, CAPILLARY
Glucose-Capillary: 122 mg/dL — ABNORMAL HIGH (ref 70–99)
Glucose-Capillary: 157 mg/dL — ABNORMAL HIGH (ref 70–99)
Glucose-Capillary: 151 mg/dL — ABNORMAL HIGH (ref 70–99)
Glucose-Capillary: 161 mg/dL — ABNORMAL HIGH (ref 70–99)

## 2020-12-27 LAB — FERRITIN: Ferritin: 118 ng/mL (ref 24–336)

## 2020-12-27 LAB — MAGNESIUM: Magnesium: 2.3 mg/dL (ref 1.7–2.4)

## 2020-12-27 LAB — C-REACTIVE PROTEIN: CRP: 5.3 mg/dL — ABNORMAL HIGH (ref <1.0)

## 2020-12-27 LAB — TSH: TSH: 0.462 u[IU]/mL (ref 0.350–4.500)

## 2020-12-27 LAB — BRAIN NATRIURETIC PEPTIDE: B Natriuretic Peptide: 417.5 pg/mL — ABNORMAL HIGH (ref 0.0–100.0)

## 2020-12-27 LAB — PHOSPHORUS: Phosphorus: 4.6 mg/dL (ref 2.5–4.6)

## 2020-12-27 MED ORDER — RIVAROXABAN 15 MG PO TABS
15.0000 mg | ORAL_TABLET | Freq: Every day | ORAL | Status: DC
  Administered 2020-12-27 – 2020-12-28 (×2): 15 mg via ORAL
  Filled 2020-12-27 (×3): qty 1

## 2020-12-27 MED ORDER — OXYCODONE HCL ER 15 MG PO T12A
15.0000 mg | EXTENDED_RELEASE_TABLET | Freq: Three times a day (TID) | ORAL | Status: DC
  Administered 2020-12-27 – 2020-12-29 (×5): 15 mg via ORAL
  Filled 2020-12-27 (×7): qty 1

## 2020-12-27 MED ORDER — DIGOXIN 0.25 MG/ML IJ SOLN
0.2500 mg | Freq: Once | INTRAMUSCULAR | Status: AC
  Administered 2020-12-27: 0.25 mg via INTRAVENOUS
  Filled 2020-12-27: qty 2

## 2020-12-27 NOTE — Progress Notes (Signed)
Patient was able to rest through the night. Awaken minimally for vital signs. C/o nausea again and PRN phenergan administered. Intentional rounding completed. Safety maintained, call bell kept within reach, bed in lowest position with all personal possessions with in reach. Will endorse.

## 2020-12-27 NOTE — Progress Notes (Signed)
PROGRESS NOTE                                                                                                                                                                                                             Patient Demographics:    Riley Martin, is a 75 y.o. male, DOB - 1945-10-10, FTD:[id number]  Outpatient Primary MD for the patient is [name], MD    LOS - 4  Admit date - 12/23/2020    Chief Complaint  Patient presents with   Shortness of Breath    SOB worsening this am, has chest discomfort for 2 weeks       Brief Narrative (HPI from H&P)  -  Riley Martin is a 75 y.o. male with a past medical history of COPD not on home oxygen, hyperlipidemia, A. fib on Xarelto, chronic kidney disease, GERD, former smoker.   The patient presents to the emergency department due to shortness of breath that has been going on for about a week and a half now.  He tested positive for COVID-19 at home on August 8.  He is unfortunately not vaccinated, he was diagnosed with severe acute hypoxic respiratory failure due to COVID-19 pneumonia and admitted to the hospital on BiPAP.   Subjective:   Patient in bed, appears comfortable, denies any headache, no fever, no chest pain or pressure, no shortness of breath , no abdominal pain. No new focal weakness.   Assessment  & Plan :     Acute Hypoxic Resp. Failure due to Acute Covid 19 Viral Pneumonitis during the ongoing 2020 Covid 19 Pandemic - he unfortunately is not vaccinated and he has incurred severe parenchymal lung injury with severe hypoxia requiring BiPAP at the time of admission >> 45 L heated high flow, he was placed on IV steroids, remdesivir and given Actemra on 12/24/2020 after appropriate consent, he is now much improved on a daily basis now down to 7 L nasal cannula oxygen and feeling a whole lot better, start tapering steroids and oxygen as  tolerated.  Encouraged the patient to sit up in chair in the daytime use I-S and flutter valve for pulmonary toiletry.  Will advance activity and titrate down oxygen as possible.   SpO2: 98 % O2 Flow Rate (L/min): 7 L/min FiO2 (%): 58 %  Recent Labs  Lab  12/23/20 1347 12/23/20 1354 12/24/20 0555 12/24/20 0903 12/25/20 0624 12/26/20 0658 12/27/20 0412  WBC 9.3  --  8.9  --  6.8 10.6* 10.5  HGB 12.4*  --  12.3*  --  12.6* 14.2 13.2  HCT 36.9*  --  36.6*  --  37.3* 40.8 38.4*  PLT 185  --  158  --  174 215 185  CRP  --   --  19.5*  --  12.4* 7.5*  --   BNP 108.0*  --  88.4  --  635.8* 471.3* 417.5*  DDIMER  --   --   --  1.23* 0.69* 0.62* 0.51*  PROCALCITON 0.71  --   --   --   --   --   --   AST 38  --  33  --  26 29 28   ALT 23  --  22  --  22 35 32  ALKPHOS 35*  --  31*  --  28* 34* 34*  BILITOT 0.7  --  0.6  --  0.7 0.8 0.7  ALBUMIN 3.4*  --  3.3*  --  2.7* 3.0* 2.8*  SARSCOV2NAA  --  POSITIVE*  --   --   --   --   --     2.  COPD.  Could have had mild exacerbation, currently no wheezing continue steroids for #1 above.  3.  History of paroxysmal A. fib with [place] vas 2 score of greater than 3.  He is on Lopressor and home dose Xarelto, due to going in RVR he was given few doses of digoxin and currently on IV amiodarone drip, his cardiology team is following, rate gradually improving , echocardiogram stable with preserved EF of 60%, TSH pending.  4.  BPH.  On Flomax continue.  5.  GERD.  On PPI.  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  7. Chronic Pain - patient on ++ dose of narcotics at baseline, says he goes into withdrawals if he does not take them, home medications ordered with as needed Narcan.  Advised not to overuse.   Currently on insulin for glycemic control due to high dose steroid exposure.  CBG (last 3)  Recent Labs    12/26/20 1632 12/26/20 2036 12/27/20 0859  GLUCAP 123* 182* 122*   Lab Results  Component Value Date   TSH 1.334  06/21/2020        Condition - Extremely Guarded  Family Communication  :  Wife Riley Martin [phone] on 12/24/20 - clearly told grave prognosis, 12/26/20  Code Status :  Full  Consults  :  PCCM, Cards  PUD Prophylaxis :  PPI   Procedures  :     TTE -  1. Left ventricular ejection fraction, by estimation, is 60 to 65%. The left ventricle has normal function. The left ventricle has no regional wall motion abnormalities. Left ventricular diastolic parameters are indeterminate.  2. Right ventricular systolic function is normal. The right ventricular size is normal. Tricuspid regurgitation signal is inadequate for assessing PA pressure.  3. The mitral valve is normal in structure. Mild mitral valve regurgitation.  4. The aortic valve was not well visualized. Aortic valve regurgitation is not visualized. Mild to moderate aortic valve sclerosis/calcification is present, unable to exclude mild stenosis, gradient was not measured      Disposition Plan  :    Status is: Inpatient  Remains inpatient appropriate because:IV treatments appropriate due to intensity of illness or inability to  take PO  Dispo: The patient is from: Home              Anticipated d/c is to: Home              Patient currently is not medically stable to d/c.   Difficult to place patient No  DVT Prophylaxis  : Lovenox.     Lab Results  Component Value Date   PLT 185 12/27/2020    Diet :  Diet Order             DIET SOFT Room service appropriate? Yes; Fluid consistency: Thin  Diet effective now                    Inpatient Medications  Scheduled Meds:  vitamin C  500 mg Oral Daily   chlorhexidine  15 mL Mouth Rinse BID   Chlorhexidine Gluconate Cloth  6 each Topical Q0600   dextromethorphan-guaiFENesin  1 tablet Oral BID   feeding supplement (NEPRO CARB STEADY)  237 mL Oral TID BM   fluticasone  2 spray Each Nare Daily   insulin aspart  0-9 Units Subcutaneous TID WC   insulin detemir  0.075  Units/kg Subcutaneous BID   mouth rinse  15 mL Mouth Rinse q12n4p   methylPREDNISolone (SOLU-MEDROL) injection  40 mg Intravenous Q12H   metoprolol tartrate  50 mg Oral BID   mirtazapine  15 mg Oral QHS   mometasone-formoterol  2 puff Inhalation BID   multivitamin with minerals  1 tablet Oral Daily   oxyCODONE  10 mg Oral TID   pantoprazole  20 mg Oral Daily   Rivaroxaban  15 mg Oral Q supper   tamsulosin  0.4 mg Oral Daily   tiotropium  18 mcg Inhalation Daily   zinc sulfate  220 mg Oral Daily   Continuous Infusions:  amiodarone 30 mg/hr (12/27/20 0133)   cefTRIAXone (ROCEPHIN)  IV Stopped (12/26/20 1539)   promethazine (PHENERGAN) injection (IM or IVPB) 12.5 mg (12/27/20 0224)   remdesivir 100 mg in NS 100 mL Stopped (12/26/20 0857)   PRN Meds:.acetaminophen, albuterol, diltiazem, diphenhydrAMINE, metoprolol tartrate, naLOXone (NARCAN)  injection, [DISCONTINUED] ondansetron **OR** ondansetron (ZOFRAN) IV, oxyCODONE-acetaminophen **AND** oxyCODONE, polyethylene glycol, promethazine (PHENERGAN) injection (IM or IVPB)  Antibiotics  :    Anti-infectives (From admission, onward)    Start     Dose/Rate Route Frequency Ordered Stop   12/24/20 1500  cefTRIAXone (ROCEPHIN) 1 g in sodium chloride 0.9 % 100 mL IVPB        1 g 200 mL/hr over 30 Minutes Intravenous Every 24 hours 12/23/20 1533 12/28/20 1459   12/24/20 1500  azithromycin (ZITHROMAX) 500 mg in sodium chloride 0.9 % 250 mL IVPB        500 mg 250 mL/hr over 60 Minutes Intravenous Every 24 hours 12/23/20 1533 12/25/20 1619   12/24/20 1000  remdesivir 100 mg in sodium chloride 0.9 % 100 mL IVPB       See Hyperspace for full Linked Orders Report.   100 mg 200 mL/hr over 30 Minutes Intravenous Daily 12/23/20 1529 12/28/20 0959   12/23/20 1630  remdesivir 200 mg in sodium chloride 0.9% 250 mL IVPB       See Hyperspace for full Linked Orders Report.   200 mg 580 mL/hr over 30 Minutes Intravenous Once 12/23/20 1529 12/23/20 1803    12/23/20 1500  cefTRIAXone (ROCEPHIN) 1 g in sodium chloride 0.9 % 100 mL IVPB  1 g 200 mL/hr over 30 Minutes Intravenous  Once 12/23/20 1447 12/23/20 1611   12/23/20 1500  azithromycin (ZITHROMAX) 500 mg in sodium chloride 0.9 % 250 mL IVPB        500 mg 250 mL/hr over 60 Minutes Intravenous  Once 12/23/20 1447 12/23/20 1638        Time Spent in minutes  30   Lala Lund M.D on 12/27/2020 at 9:55 AM  To page go to [email or web address]   [facility] -  Office  [phone]    See all Orders from today for further details    Objective:   Vitals:   12/27/20 0530 12/27/20 0600 12/27/20 0700 12/27/20 0800  BP: (!) 109/58 111/69 119/70 127/82  Pulse: 96 88 81 100  Resp: 14 13 20 15   Temp:      TempSrc:      SpO2: 95% 99% 100% 98%  Weight:      Height:        Wt Readings from Last 3 Encounters:  12/23/20 69.2 kg  07/11/20 65.8 kg  06/23/20 60.8 kg     Intake/Output Summary (Last 24 hours) at 12/27/2020 0955 Last data filed at 12/27/2020 0600 Gross per 24 hour  Intake 1006.15 ml  Output 1500 ml  Net -493.85 ml     Physical Exam  Awake Alert, No new F.N deficits, Normal affect Kirk.AT,PERRAL Supple Neck,No JVD, No cervical lymphadenopathy appriciated.  Symmetrical Chest wall movement, Good air movement bilaterally, CTAB iRRR,No Gallops, Rubs or new Murmurs, No Parasternal Heave +ve B.Sounds, Abd Soft, No tenderness, No organomegaly appriciated, No rebound - guarding or rigidity. No Cyanosis, Clubbing or edema, No new Rash or bruise     Data Review:    CBC Recent Labs  Lab 12/23/20 1347 12/24/20 0555 12/25/20 0624 12/26/20 0658 12/27/20 0412  WBC 9.3 8.9 6.8 10.6* 10.5  HGB 12.4* 12.3* 12.6* 14.2 13.2  HCT 36.9* 36.6* 37.3* 40.8 38.4*  PLT 185 158 174 215 185  MCV 99.7 101.7* 100.8* 102.0* 101.1*  MCH 33.5 34.2* 34.1* 35.5* 34.7*  MCHC 33.6 33.6 33.8 34.8 34.4  RDW 13.2 13.2 13.0 13.0 12.9  LYMPHSABS 0.6* 0.3* 0.3* 0.5* 0.6*  MONOABS 0.2  0.3 0.2 0.3 0.4  EOSABS 0.0 0.0 0.0 0.0 0.0  BASOSABS 0.0 0.0 0.0 0.0 0.0    Recent Labs  Lab 12/23/20 1347 12/24/20 0555 12/24/20 0903 12/25/20 0624 12/26/20 0658 12/27/20 0412  NA 133* 136  --  135 139 137  K 4.4 5.0  --  4.3 3.9 4.3  CL 101 101  --  101 99 103  CO2 23 28  --  24 29 26   GLUCOSE 104* 97  --  169* 123* 144*  BUN 47* 38*  --  39* 53* 65*  CREATININE 2.07* 1.70*  --  1.57* 1.79* 1.81*  CALCIUM 8.2* 8.5*  --  8.1* 8.4* 7.8*  AST 38 33  --  26 29 28   ALT 23 22  --  22 35 32  ALKPHOS 35* 31*  --  28* 34* 34*  BILITOT 0.7 0.6  --  0.7 0.8 0.7  ALBUMIN 3.4* 3.3*  --  2.7* 3.0* 2.8*  MG  --  2.1  --  2.1 2.1 2.3  CRP  --  19.5*  --  12.4* 7.5*  --   DDIMER  --   --  1.23* 0.69* 0.62* 0.51*  PROCALCITON 0.71  --   --   --   --   --  BNP 108.0* 88.4  --  635.8* 471.3* 417.5*    ------------------------------------------------------------------------------------------------------------------ No results for input(s): CHOL, HDL, LDLCALC, TRIG, CHOLHDL, LDLDIRECT in the last 72 hours.  No results found for: HGBA1C ------------------------------------------------------------------------------------------------------------------ No results for input(s): TSH, T4TOTAL, T3FREE, THYROIDAB in the last 72 hours.  Invalid input(s): FREET3  Cardiac Enzymes No results for input(s): CKMB, TROPONINI, MYOGLOBIN in the last 168 hours.  Invalid input(s): CK ------------------------------------------------------------------------------------------------------------------    Component Value Date/Time   BNP 417.5 (H) 12/27/2020 0412     Radiology Reports DG Chest Port 1 View  Result Date: 12/27/2020 CLINICAL DATA:  Shortness of breath EXAM: PORTABLE CHEST 1 VIEW COMPARISON:  Two days ago FINDINGS: Unchanged infiltrates at the right more than left base with hazy and fine interstitial pattern. No effusion or pneumothorax. Normal heart size and mediastinal contours. IMPRESSION:  Stable inferior infiltrates. Electronically Signed   By: Monte Fantasia M.D.   On: 12/27/2020 08:43   DG Chest Port 1 View  Result Date: 12/25/2020 CLINICAL DATA:  Continued shortness of breath.  COVID. EXAM: PORTABLE CHEST 1 VIEW COMPARISON:  Yesterday FINDINGS: Stable infiltrates at the lung bases. Apical emphysematous markings. Normal heart size and mediastinal contours. No visible effusion or pneumothorax. IMPRESSION: Stable lower lobe infiltrates. Electronically Signed   By: Monte Fantasia M.D.   On: 12/25/2020 07:55   DG Chest Port 1 View  Result Date: 12/24/2020 CLINICAL DATA:  COVID and shortness of breath EXAM: PORTABLE CHEST 1 VIEW COMPARISON:  Yesterday FINDINGS: Reticular opacities at the bases which are increased. No fibrosis on a February 2022 chest CT. No edema, effusion, or pneumothorax. Normal heart size and mediastinal contours. IMPRESSION: Increased infiltrates at the lung bases. Electronically Signed   By: Monte Fantasia M.D.   On: 12/24/2020 07:37   DG Chest Portable 1 View  Result Date: 12/23/2020 CLINICAL DATA:  Shortness of breath EXAM: PORTABLE CHEST 1 VIEW COMPARISON:  Radiograph 06/21/2020, chest CT 06/21/2020 FINDINGS: Unchanged cardiomediastinal silhouette. There is unchanged apical predominant emphysema. There are bibasilar opacities. No large pleural effusion or visible pneumothorax. Bilateral shoulder degenerative changes. No acute osseous abnormality. Partially visualized cervical spine fusion hardware. IMPRESSION: Bibasilar opacities which could represent an infectious inflammatory process. Emphysema. Electronically Signed   By: Maurine Simmering M.D.   On: 12/23/2020 14:34   DG Abd Portable 1V  Result Date: 12/24/2020 CLINICAL DATA:  Nausea and vomiting EXAM: PORTABLE ABDOMEN - 1 VIEW COMPARISON:  11/17/2019 FINDINGS: 2 supine frontal views of the abdomen and pelvis are obtained. Portions of the right flank and right hemidiaphragm are excluded by collimation. The bowel  gas pattern is unremarkable without obstruction or ileus. No masses or abnormal calcifications. Patchy bibasilar airspace disease again noted, not appreciably changed since preceding chest x-ray. IMPRESSION: 1. Unremarkable bowel gas pattern. 2. Bibasilar airspace disease unchanged. Electronically Signed   By: Randa Ngo M.D.   On: 12/24/2020 16:22   ECHOCARDIOGRAM COMPLETE  Result Date: 12/25/2020    ECHOCARDIOGRAM REPORT   Patient Name:   [facility] Date of Exam: 12/25/2020 Medical Rec #:  [id number]          Height:       72.0 in Accession #:    [id number]         Weight:       152.6 lb Date of Birth:  Sep 11, 1945          BSA:          1.899 m Patient Age:  74 years           BP:           112/74 mmHg Patient Gender: M                  HR:           112 bpm. Exam Location:  [facility] Procedure: 2D Echo Indications:     Acute Respiratory Distress R06.03  History:         Patient has prior history of Echocardiogram examinations, most                  recent 06/21/2020.  Sonographer:     Kathlen Brunswick RDCS Referring Phys:  [id number] Flora Lipps Diagnosing Phys: Ida Rogue MD IMPRESSIONS  1. Left ventricular ejection fraction, by estimation, is 60 to 65%. The left ventricle has normal function. The left ventricle has no regional wall motion abnormalities. Left ventricular diastolic parameters are indeterminate.  2. Right ventricular systolic function is normal. The right ventricular size is normal. Tricuspid regurgitation signal is inadequate for assessing PA pressure.  3. The mitral valve is normal in structure. Mild mitral valve regurgitation.  4. The aortic valve was not well visualized. Aortic valve regurgitation is not visualized. Mild to moderate aortic valve sclerosis/calcification is present, unable to exclude mild stenosis, gradient was not measured. FINDINGS  Left Ventricle: Left ventricular ejection fraction, by estimation, is 60 to 65%. The left ventricle has normal function. The left  ventricle has no regional wall motion abnormalities. The left ventricular internal cavity size was normal in size. There is  no left ventricular hypertrophy. Left ventricular diastolic parameters are indeterminate. Right Ventricle: The right ventricular size is normal. No increase in right ventricular wall thickness. Right ventricular systolic function is normal. Tricuspid regurgitation signal is inadequate for assessing PA pressure. Left Atrium: Left atrial size was normal in size. Right Atrium: Right atrial size was normal in size. Pericardium: There is no evidence of pericardial effusion. Mitral Valve: The mitral valve is normal in structure. Mild mitral valve regurgitation. No evidence of mitral valve stenosis. Tricuspid Valve: The tricuspid valve is normal in structure. Tricuspid valve regurgitation is mild . No evidence of tricuspid stenosis. Aortic Valve: The aortic valve was not well visualized. Aortic valve regurgitation is not visualized. Mild to moderate aortic valve sclerosis/calcification is present, unable to exclude mild stenosis, gradient was not measured.  Pulmonic Valve: The pulmonic valve was normal in structure. Pulmonic valve regurgitation is not visualized. No evidence of pulmonic stenosis. Aorta: The aortic root is normal in size and structure. Venous: The pulmonary veins were not well visualized. The inferior vena cava was not well visualized. The inferior vena cava is normal in size with greater than 50% respiratory variability, suggesting right atrial pressure of 3 mmHg. IAS/Shunts: No atrial level shunt detected by color flow Doppler.  LEFT VENTRICLE PLAX 2D LVIDd:         4.31 cm  Diastology LVIDs:         3.07 cm  LV e' medial:    12.10 cm/s LV PW:         1.33 cm  LV E/e' medial:  6.2 LV IVS:        1.10 cm  LV e' lateral:   15.00 cm/s LVOT diam:     1.80 cm  LV E/e' lateral: 5.0 LV SV:         29 LV SV Index:   15 LVOT Area:  2.54 cm  RIGHT VENTRICLE RV Basal diam:  3.64 cm RV S  prime:     19.70 cm/s TAPSE (M-mode): 1.9 cm LEFT ATRIUM             Index       RIGHT ATRIUM           Index LA diam:        3.00 cm 1.58 cm/m  RA Area:     15.20 cm LA Vol (A2C):   29.5 ml 15.53 ml/m RA Volume:   41.20 ml  21.70 ml/m LA Vol (A4C):   26.9 ml 14.17 ml/m LA Biplane Vol: 29.9 ml 15.75 ml/m  AORTIC VALVE             PULMONIC VALVE LVOT Vmax:   65.90 cm/s  PV Vmax:       1.11 m/s LVOT Vmean:  45.100 cm/s PV Peak grad:  4.9 mmHg LVOT VTI:    0.114 m  AORTA Ao Root diam: 3.20 cm Ao Asc diam:  3.40 cm MITRAL VALVE               TRICUSPID VALVE MV Area (PHT): 4.86 cm    TV Peak grad:   15.5 mmHg MV Decel Time: 156 msec    TV Vmax:        1.97 m/s MV E velocity: 75.40 cm/s                            SHUNTS                            Systemic VTI:  0.11 m                            Systemic Diam: 1.80 cm Ida Rogue MD Electronically signed by Ida Rogue MD Signature Date/Time: 12/25/2020/12:36:16 PM    Final

## 2020-12-27 NOTE — Progress Notes (Signed)
[facility]  SUBJECTIVE: Patient laying in bed, sleeping   Vitals:   12/27/20 0430 12/27/20 0500 12/27/20 0530 12/27/20 0600  BP: 106/62 (!) 101/59 (!) 109/58 111/69  Pulse: (!) 103 88 96 88  Resp: 13 13 14 13   Temp: 97.9 F (36.6 C)     TempSrc: Oral     SpO2: 98% 100% 95% 99%  Weight:      Height:         Intake/Output Summary (Last 24 hours) at 12/27/2020 0747 Last data filed at 12/27/2020 0600 Gross per 24 hour  Intake 1006.15 ml  Output 2000 ml  Net -993.85 ml      PHYSICAL EXAM  General: Well developed, well nourished, in no acute distress HEENT:  Normocephalic and atramatic Neck:  No JVD.  Lungs: Clear bilaterally to auscultation and percussion. Heart: HRRR . Normal S1 and S2 without gallops or murmurs.  Abdomen: Bowel sounds are positive, abdomen soft and non-tender  Msk:  Back normal, normal gait. Normal strength and tone for age. Extremities: No clubbing, cyanosis or edema.   Neuro: Alert and oriented X 3. Psych:  Good affect, responds appropriately   LABS: Basic Metabolic Panel: Recent Labs    12/26/20 0658 12/27/20 0412  NA 139 137  K 3.9 4.3  CL 99 103  CO2 29 26  GLUCOSE 123* 144*  BUN 53* 65*  CREATININE 1.79* 1.81*  CALCIUM 8.4* 7.8*  MG 2.1 2.3  PHOS 3.6 4.6   Liver Function Tests: Recent Labs    12/26/20 0658 12/27/20 0412  AST 29 28  ALT 35 32  ALKPHOS 34* 34*  BILITOT 0.8 0.7  PROT 6.3* 5.8*  ALBUMIN 3.0* 2.8*   No results for input(s): LIPASE, AMYLASE in the last 72 hours. CBC: Recent Labs    12/26/20 0658 12/27/20 0412  WBC 10.6* 10.5  NEUTROABS 9.8* 9.4*  HGB 14.2 13.2  HCT 40.8 38.4*  MCV 102.0* 101.1*  PLT 215 185   Cardiac Enzymes: No results for input(s): CKTOTAL, CKMB, CKMBINDEX, TROPONINI in the last 72 hours. BNP: Invalid input(s): POCBNP D-Dimer: Recent Labs    12/26/20 0658 12/27/20 0412  DDIMER 0.62* 0.51*   Hemoglobin A1C: No results for input(s): HGBA1C in the last 72 hours. Fasting  Lipid Panel: No results for input(s): CHOL, HDL, LDLCALC, TRIG, CHOLHDL, LDLDIRECT in the last 72 hours. Thyroid Function Tests: No results for input(s): TSH, T4TOTAL, T3FREE, THYROIDAB in the last 72 hours.  Invalid input(s): FREET3 Anemia Panel: Recent Labs    12/27/20 0412  FERRITIN 118    ECHOCARDIOGRAM COMPLETE  Result Date: 12/25/2020    ECHOCARDIOGRAM REPORT   Patient Name:   Riley Martin Date of Exam: 12/25/2020 Medical Rec #:  [id number]          Height:       72.0 in Accession #:    [id number]         Weight:       152.6 lb Date of Birth:  1945/09/26          BSA:          1.899 m Patient Age:    75 years           BP:           112/74 mmHg Patient Gender: M                  HR:           112 bpm.  Exam Location:  [facility] Procedure: 2D Echo Indications:     Acute Respiratory Distress R06.03  History:         Patient has prior history of Echocardiogram examinations, most                  recent 06/21/2020.  Sonographer:     Kathlen Brunswick RDCS Referring Phys:  [id number] Flora Lipps Diagnosing Phys: Ida Rogue MD IMPRESSIONS  1. Left ventricular ejection fraction, by estimation, is 60 to 65%. The left ventricle has normal function. The left ventricle has no regional wall motion abnormalities. Left ventricular diastolic parameters are indeterminate.  2. Right ventricular systolic function is normal. The right ventricular size is normal. Tricuspid regurgitation signal is inadequate for assessing PA pressure.  3. The mitral valve is normal in structure. Mild mitral valve regurgitation.  4. The aortic valve was not well visualized. Aortic valve regurgitation is not visualized. Mild to moderate aortic valve sclerosis/calcification is present, unable to exclude mild stenosis, gradient was not measured. FINDINGS  Left Ventricle: Left ventricular ejection fraction, by estimation, is 60 to 65%. The left ventricle has normal function. The left ventricle has no regional wall motion abnormalities. The  left ventricular internal cavity size was normal in size. There is  no left ventricular hypertrophy. Left ventricular diastolic parameters are indeterminate. Right Ventricle: The right ventricular size is normal. No increase in right ventricular wall thickness. Right ventricular systolic function is normal. Tricuspid regurgitation signal is inadequate for assessing PA pressure. Left Atrium: Left atrial size was normal in size. Right Atrium: Right atrial size was normal in size. Pericardium: There is no evidence of pericardial effusion. Mitral Valve: The mitral valve is normal in structure. Mild mitral valve regurgitation. No evidence of mitral valve stenosis. Tricuspid Valve: The tricuspid valve is normal in structure. Tricuspid valve regurgitation is mild . No evidence of tricuspid stenosis. Aortic Valve: The aortic valve was not well visualized. Aortic valve regurgitation is not visualized. Mild to moderate aortic valve sclerosis/calcification is present, unable to exclude mild stenosis, gradient was not measured.  Pulmonic Valve: The pulmonic valve was normal in structure. Pulmonic valve regurgitation is not visualized. No evidence of pulmonic stenosis. Aorta: The aortic root is normal in size and structure. Venous: The pulmonary veins were not well visualized. The inferior vena cava was not well visualized. The inferior vena cava is normal in size with greater than 50% respiratory variability, suggesting right atrial pressure of 3 mmHg. IAS/Shunts: No atrial level shunt detected by color flow Doppler.  LEFT VENTRICLE PLAX 2D LVIDd:         4.31 cm  Diastology LVIDs:         3.07 cm  LV e' medial:    12.10 cm/s LV PW:         1.33 cm  LV E/e' medial:  6.2 LV IVS:        1.10 cm  LV e' lateral:   15.00 cm/s LVOT diam:     1.80 cm  LV E/e' lateral: 5.0 LV SV:         29 LV SV Index:   15 LVOT Area:     2.54 cm  RIGHT VENTRICLE RV Basal diam:  3.64 cm RV S prime:     19.70 cm/s TAPSE (M-mode): 1.9 cm LEFT ATRIUM              Index       RIGHT ATRIUM  Index LA diam:        3.00 cm 1.58 cm/m  RA Area:     15.20 cm LA Vol (A2C):   29.5 ml 15.53 ml/m RA Volume:   41.20 ml  21.70 ml/m LA Vol (A4C):   26.9 ml 14.17 ml/m LA Biplane Vol: 29.9 ml 15.75 ml/m  AORTIC VALVE             PULMONIC VALVE LVOT Vmax:   65.90 cm/s  PV Vmax:       1.11 m/s LVOT Vmean:  45.100 cm/s PV Peak grad:  4.9 mmHg LVOT VTI:    0.114 m  AORTA Ao Root diam: 3.20 cm Ao Asc diam:  3.40 cm MITRAL VALVE               TRICUSPID VALVE MV Area (PHT): 4.86 cm    TV Peak grad:   15.5 mmHg MV Decel Time: 156 msec    TV Vmax:        1.97 m/s MV E velocity: 75.40 cm/s                            SHUNTS                            Systemic VTI:  0.11 m                            Systemic Diam: 1.80 cm Ida Rogue MD Electronically signed by Ida Rogue MD Signature Date/Time: 12/25/2020/12:36:16 PM    Final      Echo LVEF 60 to 65%  TELEMETRY: Atrial fibrillation 94 bpm:  ASSESSMENT AND PLAN:  Active Problems:   COVID-19    1. Atrial fibrillation with rapid ventricular rate, exacerbated by COVID-19 pneumonia and nausea, currently on Lovenox for stroke prevention and metoprolol tartrate uptitrated to 50 mg twice daily and started on amiodarone infusion for rate control.  Patient on Xarelto for stroke prevention at home.  Blood pressure currently low normal. 2.  Respiratory failure, secondary to COVID-19 viral pneumonitis, on BiPAP, broad-spectrum antibiotics, remdesivir, and IV steroids   Recommendations   1.  Agree with overall current therapy 2.  DC Lovenox 3.  Resume Xarelto 3.  Continue metoprolol to tartrate 50 mg twice daily 4.  Continue amiodarone infusion for now, transition to p.o. amiodarone in a.m.     Isaias Cowman, MD, PhD, [facility] 12/27/2020 7:47 AM

## 2020-12-27 NOTE — Plan of Care (Signed)
  Problem: Nutrition: Goal: Adequate nutrition will be maintained Outcome: Progressing  Patient able to eat 50% of meal when offered food patient likes. Family brought meal from [employer].

## 2020-12-27 NOTE — Progress Notes (Signed)
PT Cancellation Note  Patient Details Name: Riley Martin MRN: [id number] DOB: 1946/02/02   Cancelled Treatment:    Reason Eval/Treat Not Completed: Medical issues which prohibited therapy  Pt sitting EOB unsupported talking to visitor in room.  Watched monitors from outside of door for several minutes.  Sats 85/86% while sitting and talking.  Mobility deferred at this time.  Will continue at a later time/date.   Chesley Noon 12/27/2020, 3:38 PM

## 2020-12-28 ENCOUNTER — Other Ambulatory Visit: Payer: Self-pay

## 2020-12-28 ENCOUNTER — Encounter: Payer: Self-pay | Admitting: Family Medicine

## 2020-12-28 LAB — COMPREHENSIVE METABOLIC PANEL
ALT: 109 U/L — ABNORMAL HIGH (ref 0–44)
AST: 69 U/L — ABNORMAL HIGH (ref 15–41)
Albumin: 3 g/dL — ABNORMAL LOW (ref 3.5–5.0)
Alkaline Phosphatase: 37 U/L — ABNORMAL LOW (ref 38–126)
Anion gap: 8 (ref 5–15)
BUN: 56 mg/dL — ABNORMAL HIGH (ref 8–23)
CO2: 31 mmol/L (ref 22–32)
Calcium: 8.4 mg/dL — ABNORMAL LOW (ref 8.9–10.3)
Chloride: 99 mmol/L (ref 98–111)
Creatinine, Ser: 1.77 mg/dL — ABNORMAL HIGH (ref 0.61–1.24)
GFR, Estimated: 39 mL/min — ABNORMAL LOW (ref >60)
Glucose, Bld: 108 mg/dL — ABNORMAL HIGH (ref 70–99)
Potassium: 4.4 mmol/L (ref 3.5–5.1)
Sodium: 138 mmol/L (ref 135–145)
Total Bilirubin: 0.6 mg/dL (ref 0.3–1.2)
Total Protein: 6 g/dL — ABNORMAL LOW (ref 6.5–8.1)

## 2020-12-28 LAB — CBC WITH DIFFERENTIAL/PLATELET
Abs Immature Granulocytes: 0.07 10*3/uL (ref 0.00–0.07)
Basophils Absolute: 0 10*3/uL (ref 0.0–0.1)
Basophils Relative: 0 %
Eosinophils Absolute: 0 10*3/uL (ref 0.0–0.5)
Eosinophils Relative: 0 %
HCT: 39.2 % (ref 39.0–52.0)
Hemoglobin: 13.7 g/dL (ref 13.0–17.0)
Immature Granulocytes: 1 %
Lymphocytes Relative: 5 %
Lymphs Abs: 0.5 10*3/uL — ABNORMAL LOW (ref 0.7–4.0)
MCH: 35.6 pg — ABNORMAL HIGH (ref 26.0–34.0)
MCHC: 34.9 g/dL (ref 30.0–36.0)
MCV: 101.8 fL — ABNORMAL HIGH (ref 80.0–100.0)
Monocytes Absolute: 0.2 10*3/uL (ref 0.1–1.0)
Monocytes Relative: 2 %
Neutro Abs: 9 10*3/uL — ABNORMAL HIGH (ref 1.7–7.7)
Neutrophils Relative %: 92 %
Platelets: 183 10*3/uL (ref 150–400)
RBC: 3.85 MIL/uL — ABNORMAL LOW (ref 4.22–5.81)
RDW: 12.5 % (ref 11.5–15.5)
Smear Review: NORMAL
WBC: 9.8 10*3/uL (ref 4.0–10.5)
nRBC: 0 % (ref 0.0–0.2)

## 2020-12-28 LAB — GLUCOSE, CAPILLARY
Glucose-Capillary: 111 mg/dL — ABNORMAL HIGH (ref 70–99)
Glucose-Capillary: 252 mg/dL — ABNORMAL HIGH (ref 70–99)
Glucose-Capillary: 230 mg/dL — ABNORMAL HIGH (ref 70–99)
Glucose-Capillary: 119 mg/dL — ABNORMAL HIGH (ref 70–99)

## 2020-12-28 LAB — C-REACTIVE PROTEIN: CRP: 3.6 mg/dL — ABNORMAL HIGH (ref <1.0)

## 2020-12-28 LAB — BRAIN NATRIURETIC PEPTIDE: B Natriuretic Peptide: 175.8 pg/mL — ABNORMAL HIGH (ref 0.0–100.0)

## 2020-12-28 MED ORDER — SALINE SPRAY 0.65 % NA SOLN
1.0000 | NASAL | Status: DC | PRN
  Filled 2020-12-28: qty 44

## 2020-12-28 MED ORDER — AMIODARONE HCL 200 MG PO TABS
200.0000 mg | ORAL_TABLET | Freq: Two times a day (BID) | ORAL | Status: DC
  Administered 2020-12-28 – 2020-12-29 (×3): 200 mg via ORAL
  Filled 2020-12-28 (×3): qty 1

## 2020-12-28 NOTE — Progress Notes (Signed)
PT Cancellation Note  Patient Details Name: Riley Martin MRN: [id number] DOB: 08-17-1945   Cancelled Treatment:     PT attempt. Soundly asleep upon arriving. He participated in OT and tolerated increased activity well. Requesting resting this afternoon. Will return next date and continue to follow per current POC.    Willette Pa 12/28/2020, 3:52 PM

## 2020-12-28 NOTE — Progress Notes (Signed)
Initial Nutrition Assessment  DOCUMENTATION CODES:  Not applicable  INTERVENTION:  Liberalize diet to regular as pt is no longer reporting nausea If pt remains inpatient, add Ensure Enlive po BID, each supplement provides 350 kcal and 20 grams of protein Continue MVI with minerals daily  NUTRITION DIAGNOSIS:  Increased nutrient needs related to acute illness (COVID19) as evidenced by estimated needs.  GOAL:  Patient will meet greater than or equal to 90% of their needs  MONITOR:  PO intake, Supplement acceptance  REASON FOR ASSESSMENT:  Malnutrition Screening Tool    ASSESSMENT:  75 y.o. male with past medical history of HLD, COPD, atrial fibrillation, CKD, IBS, and GERD presented to the ED for shortness of breath worsening for the past 10 days. Reports a positive home COVID19 test 12/13/20. Worsening respiratory status despite being prescribed steroids by PCP.  Pt admitted to ICU and placed on BiPAP initially to maintain O2 saturations. Able to be moved from ICU to floor 8/24 and weaned to nasal canula.  Per chart review, pt to be discharged home today on home O2 but pt reported poor PO intake and weight loss PTA. Limited intake recorded this admission due to isolation status but upon review of weights, no loss is noted. Weight gain is seen over the last several years in weight hx. Weight loss and poor intake were likely acute due to SOB and nausea. Pt has reported an improvement in nausea this admission.   If pt is to remain inpatient, will add nutrition supplements to support elevated needs with COVID19.   Average Meal Intake: 8/19: 50% intake x 1 recorded meal  Nutritionally Relevant Medications: Scheduled Meds:  vitamin C  500 mg Oral Daily   feeding supplement (NEPRO CARB STEADY)  237 mL Oral TID BM   insulin aspart  0-9 Units Subcutaneous TID WC   insulin detemir  0.075 Units/kg Subcutaneous BID   methylPREDNISolone (SOLU-MEDROL) injection  40 mg Intravenous Q12H    mirtazapine  15 mg Oral QHS   multivitamin with minerals  1 tablet Oral Daily   pantoprazole  20 mg Oral Daily   zinc sulfate  220 mg Oral Daily   PRN Meds: diphenhydrAMINE, ondansetron, polyethylene glycol, promethazine   Labs Reviewed: BUN 52, creatinine 1.75 SBG ranges from 111-252 mg/dL over the last 24 hours  NUTRITION - FOCUSED PHYSICAL EXAM: Defer at this time, pt preparing to be discharged.  Diet Order:   Diet Order             Diet - low sodium heart healthy           DIET SOFT Room service appropriate? Yes; Fluid consistency: Thin  Diet effective now                   EDUCATION NEEDS:  No education needs have been identified at this time  Skin:  Skin Assessment: Reviewed RN Assessment  Last BM:  8/23  Height:  Ht Readings from Last 1 Encounters:  12/23/20 6' (1.829 m)    Weight:  Wt Readings from Last 1 Encounters:  12/23/20 69.2 kg    Ideal Body Weight:  80.9 kg  BMI:  Body mass index is 20.69 kg/m.  Estimated Nutritional Needs:  Kcal:  1800-2000 kcal/d Protein:  90-100 g/d Fluid:  >2L/d  Ranell Patrick, RD, LDN Clinical Dietitian Pager on [place]

## 2020-12-28 NOTE — Progress Notes (Signed)
[facility]  SUBJECTIVE: Patient laying in bed, denies chest pain, nausea improved   Vitals:   12/28/20 0300 12/28/20 0400 12/28/20 0500 12/28/20 0600  BP: (!) 107/47 (!) 101/48 (!) 126/59 128/76  Pulse: 68 93 (!) 102 62  Resp:   (!) 24 (!) 25  Temp:  (!) 96.2 F (35.7 C)    TempSrc:  Axillary    SpO2: 94% 92% (!) 87% 93%  Weight:      Height:         Intake/Output Summary (Last 24 hours) at 12/28/2020 0826 Last data filed at 12/28/2020 0816 Gross per 24 hour  Intake 747.06 ml  Output 1000 ml  Net -252.94 ml      PHYSICAL EXAM  General: Well developed, well nourished, in no acute distress HEENT:  Normocephalic and atramatic Neck:  No JVD.  Lungs: Clear bilaterally to auscultation and percussion. Heart: HRRR . Normal S1 and S2 without gallops or murmurs.  Abdomen: Bowel sounds are positive, abdomen soft and non-tender  Msk:  Back normal, normal gait. Normal strength and tone for age. Extremities: No clubbing, cyanosis or edema.   Neuro: Alert and oriented X 3. Psych:  Good affect, responds appropriately   LABS: Basic Metabolic Panel: Recent Labs    12/26/20 0658 12/27/20 0412 12/28/20 0557  NA 139 137 138  K 3.9 4.3 4.4  CL 99 103 99  CO2 29 26 31   GLUCOSE 123* 144* 108*  BUN 53* 65* 56*  CREATININE 1.79* 1.81* 1.77*  CALCIUM 8.4* 7.8* 8.4*  MG 2.1 2.3  --   PHOS 3.6 4.6  --    Liver Function Tests: Recent Labs    12/27/20 0412 12/28/20 0557  AST 28 69*  ALT 32 109*  ALKPHOS 34* 37*  BILITOT 0.7 0.6  PROT 5.8* 6.0*  ALBUMIN 2.8* 3.0*   No results for input(s): LIPASE, AMYLASE in the last 72 hours. CBC: Recent Labs    12/27/20 0412 12/28/20 0557  WBC 10.5 9.8  NEUTROABS 9.4* 9.0*  HGB 13.2 13.7  HCT 38.4* 39.2  MCV 101.1* 101.8*  PLT 185 183   Cardiac Enzymes: No results for input(s): CKTOTAL, CKMB, CKMBINDEX, TROPONINI in the last 72 hours. BNP: Invalid input(s): POCBNP D-Dimer: Recent Labs    12/26/20 0658 12/27/20 0412   DDIMER 0.62* 0.51*   Hemoglobin A1C: No results for input(s): HGBA1C in the last 72 hours. Fasting Lipid Panel: No results for input(s): CHOL, HDL, LDLCALC, TRIG, CHOLHDL, LDLDIRECT in the last 72 hours. Thyroid Function Tests: Recent Labs    12/27/20 0412  TSH 0.462   Anemia Panel: Recent Labs    12/27/20 0412  FERRITIN 118    DG Chest Port 1 View  Result Date: 12/27/2020 CLINICAL DATA:  Shortness of breath EXAM: PORTABLE CHEST 1 VIEW COMPARISON:  Two days ago FINDINGS: Unchanged infiltrates at the right more than left base with hazy and fine interstitial pattern. No effusion or pneumothorax. Normal heart size and mediastinal contours. IMPRESSION: Stable inferior infiltrates. Electronically Signed   By: Monte Fantasia M.D.   On: 12/27/2020 08:43     Echo LVEF 60 to 65%  TELEMETRY: Sinus arrhythmia at 75 bpm:  ASSESSMENT AND PLAN:  Active Problems:   COVID-19    1.  Atrial fibrillation with rapid ventricular rate, exacerbated by COVID-19 pneumonia and nausea, on Xarelto for stroke prevention, converted to sinus rhythm on amiodarone bolus and infusion, on metoprolol to tartrate for rate control. 2.  Respiratory failure, secondary to  COVID-19 viral pneumonitis, on BiPAP, broad-spectrum antibiotics, remdesivir, and IV steroids  Recommendations  1.  Agree with current therapy 2.  Continue Xarelto for stroke prevention 3.  Continue metoprolol to tartrate 50 mg twice daily for rate control 4.  Transition amiodarone infusion to p.o. amiodarone 200 mg twice daily     Isaias Cowman, MD, PhD, [facility] 12/28/2020 8:26 AM

## 2020-12-28 NOTE — Progress Notes (Addendum)
PROGRESS NOTE                                                                                                                                                                                                             Patient Demographics:    Riley Martin, is a 75 y.o. male, DOB - 10-Oct-1945, XBD:[id number]  Outpatient Primary MD for the patient is [name], MD    LOS - 5  Admit date - 12/23/2020    Chief Complaint  Patient presents with   Shortness of Breath    SOB worsening this am, has chest discomfort for 2 weeks       Brief Narrative (HPI from H&P)  -  Riley Martin is a 75 y.o. male with a past medical history of COPD not on home oxygen, hyperlipidemia, A. fib on Xarelto, chronic kidney disease, GERD, former smoker.   The patient presents to the emergency department due to shortness of breath that has been going on for about a week and a half now.  He tested positive for COVID-19 at home on August 8.  He is unfortunately not vaccinated, he was diagnosed with severe acute hypoxic respiratory failure due to COVID-19 pneumonia and admitted to the hospital on BiPAP.   Subjective:   Patient in bed, appears comfortable, denies any headache, no fever, no chest pain or pressure, no shortness of breath , no abdominal pain. No new focal weakness.   Assessment  & Plan :     Acute Hypoxic Resp. Failure due to Acute Covid 19 Viral Pneumonitis during the ongoing 2020 Covid 19 Pandemic - he unfortunately is not vaccinated and he has incurred severe parenchymal lung injury with severe hypoxia requiring BiPAP at the time of admission >> 45 L heated high flow, he was placed on IV Steroids, Remdesivir and given Actemra on 12/24/2020 after appropriate consent, he is now much improved on a daily basis now down to 6 L nasal cannula oxygen and feeling a whole lot better, start tapering steroids and oxygen as  tolerated.  Encouraged the patient to sit up in chair in the daytime use I-S and flutter valve for pulmonary toiletry.  Will advance activity and titrate down oxygen as possible.   SpO2: 92 % O2 Flow Rate (L/min): 6 L/min FiO2 (%): 58 %  Recent Labs  Lab  12/23/20 1347 12/23/20 1354 12/24/20 0555 12/24/20 0903 12/25/20 0624 12/26/20 0658 12/27/20 0412 12/28/20 0557  WBC 9.3  --  8.9  --  6.8 10.6* 10.5 9.8  HGB 12.4*  --  12.3*  --  12.6* 14.2 13.2 13.7  HCT 36.9*  --  36.6*  --  37.3* 40.8 38.4* 39.2  PLT 185  --  158  --  174 215 185 183  CRP  --   --  19.5*  --  12.4* 7.5* 5.3*  --   BNP 108.0*  --  88.4  --  635.8* 471.3* 417.5* 175.8*  DDIMER  --   --   --  1.23* 0.69* 0.62* 0.51*  --   PROCALCITON 0.71  --   --   --   --   --   --   --   AST 38  --  33  --  26 29 28  69*  ALT 23  --  22  --  22 35 32 109*  ALKPHOS 35*  --  31*  --  28* 34* 34* 37*  BILITOT 0.7  --  0.6  --  0.7 0.8 0.7 0.6  ALBUMIN 3.4*  --  3.3*  --  2.7* 3.0* 2.8* 3.0*  SARSCOV2NAA  --  POSITIVE*  --   --   --   --   --   --     2.  COPD.  Could have had mild exacerbation, currently no wheezing continue steroids for #1 above.  3.  History of paroxysmal A. fib with [place] vas 2 score of greater than 3.  He is on Lopressor and home dose Xarelto, due to going in RVR he was given few doses of digoxin and currently on IV amiodarone drip >> PO, his cardiology team is following, rate gradually improving , echocardiogram stable with preserved EF of 60%, stable TSH.  4.  BPH.  On Flomax continue.  5.  GERD.  On PPI.  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  7. Chronic Pain - patient on ++ dose of narcotics at baseline, says he goes into withdrawals if he does not take them, home medications ordered with as needed Narcan.  Advised not to overuse.   Currently on insulin for glycemic control due to high dose steroid exposure.  CBG (last 3)  Recent Labs    12/27/20 1736 12/27/20 2126  12/28/20 0807  GLUCAP 151* 161* 111*   Lab Results  Component Value Date   TSH 0.462 12/27/2020        Condition - Extremely Guarded  Family Communication  :  Wife Riley Martin [phone] on 12/24/20 - clearly told grave prognosis, 12/26/20, 12/28/20  Code Status :  Full  Consults  :  PCCM, Cards  PUD Prophylaxis :  PPI   Procedures  :     TTE -  1. Left ventricular ejection fraction, by estimation, is 60 to 65%. The left ventricle has normal function. The left ventricle has no regional wall motion abnormalities. Left ventricular diastolic parameters are indeterminate.  2. Right ventricular systolic function is normal. The right ventricular size is normal. Tricuspid regurgitation signal is inadequate for assessing PA pressure.  3. The mitral valve is normal in structure. Mild mitral valve regurgitation.  4. The aortic valve was not well visualized. Aortic valve regurgitation is not visualized. Mild to moderate aortic valve sclerosis/calcification is present, unable to exclude mild stenosis, gradient was not measured  Disposition Plan  :    Status is: Inpatient  Remains inpatient appropriate because:IV treatments appropriate due to intensity of illness or inability to take PO  Dispo: The patient is from: Home              Anticipated d/c is to: Home              Patient currently is not medically stable to d/c.   Difficult to place patient No  DVT Prophylaxis  : Lovenox.     Lab Results  Component Value Date   PLT 183 12/28/2020    Diet :  Diet Order             DIET SOFT Room service appropriate? Yes; Fluid consistency: Thin  Diet effective now                    Inpatient Medications  Scheduled Meds:  amiodarone  200 mg Oral BID   vitamin C  500 mg Oral Daily   chlorhexidine  15 mL Mouth Rinse BID   Chlorhexidine Gluconate Cloth  6 each Topical Q0600   dextromethorphan-guaiFENesin  1 tablet Oral BID   feeding supplement (NEPRO CARB STEADY)  237 mL  Oral TID BM   fluticasone  2 spray Each Nare Daily   insulin aspart  0-9 Units Subcutaneous TID WC   insulin detemir  0.075 Units/kg Subcutaneous BID   mouth rinse  15 mL Mouth Rinse q12n4p   methylPREDNISolone (SOLU-MEDROL) injection  40 mg Intravenous Q12H   metoprolol tartrate  50 mg Oral BID   mirtazapine  15 mg Oral QHS   mometasone-formoterol  2 puff Inhalation BID   multivitamin with minerals  1 tablet Oral Daily   oxyCODONE  15 mg Oral TID   pantoprazole  20 mg Oral Daily   Rivaroxaban  15 mg Oral Q supper   tamsulosin  0.4 mg Oral Daily   tiotropium  18 mcg Inhalation Daily   zinc sulfate  220 mg Oral Daily   Continuous Infusions:  promethazine (PHENERGAN) injection (IM or IVPB) Stopped (12/27/20 2012)   PRN Meds:.acetaminophen, albuterol, diltiazem, diphenhydrAMINE, metoprolol tartrate, naLOXone (NARCAN)  injection, [DISCONTINUED] ondansetron **OR** ondansetron (ZOFRAN) IV, oxyCODONE-acetaminophen **AND** oxyCODONE, polyethylene glycol, promethazine (PHENERGAN) injection (IM or IVPB)  Antibiotics  :    Anti-infectives (From admission, onward)    Start     Dose/Rate Route Frequency Ordered Stop   12/24/20 1500  cefTRIAXone (ROCEPHIN) 1 g in sodium chloride 0.9 % 100 mL IVPB        1 g 200 mL/hr over 30 Minutes Intravenous Every 24 hours 12/23/20 1533 12/27/20 1739   12/24/20 1500  azithromycin (ZITHROMAX) 500 mg in sodium chloride 0.9 % 250 mL IVPB        500 mg 250 mL/hr over 60 Minutes Intravenous Every 24 hours 12/23/20 1533 12/25/20 1619   12/24/20 1000  remdesivir 100 mg in sodium chloride 0.9 % 100 mL IVPB       See Hyperspace for full Linked Orders Report.   100 mg 200 mL/hr over 30 Minutes Intravenous Daily 12/23/20 1529 12/27/20 1033   12/23/20 1630  remdesivir 200 mg in sodium chloride 0.9% 250 mL IVPB       See Hyperspace for full Linked Orders Report.   200 mg 580 mL/hr over 30 Minutes Intravenous Once 12/23/20 1529 12/23/20 1803   12/23/20 1500   cefTRIAXone (ROCEPHIN) 1 g in sodium chloride 0.9 % 100 mL IVPB  1 g 200 mL/hr over 30 Minutes Intravenous  Once 12/23/20 1447 12/23/20 1611   12/23/20 1500  azithromycin (ZITHROMAX) 500 mg in sodium chloride 0.9 % 250 mL IVPB        500 mg 250 mL/hr over 60 Minutes Intravenous  Once 12/23/20 1447 12/23/20 1638        Time Spent in minutes  30   Lala Lund M.D on 12/28/2020 at 9:44 AM  To page go to [email or web address]   [facility] -  Office  [phone]    See all Orders from today for further details    Objective:   Vitals:   12/28/20 0600 12/28/20 0700 12/28/20 0800 12/28/20 0900  BP: 128/76 100/67 101/68 (!) 146/82  Pulse: 62 66 (!) 39 83  Resp: (!) 25 15  16   Temp:    (!) 97.2 F (36.2 C)  TempSrc:    Axillary  SpO2: 93% 91% (!) 83% 92%  Weight:      Height:        Wt Readings from Last 3 Encounters:  12/23/20 69.2 kg  07/11/20 65.8 kg  06/23/20 60.8 kg     Intake/Output Summary (Last 24 hours) at 12/28/2020 0944 Last data filed at 12/28/2020 0816 Gross per 24 hour  Intake 747.06 ml  Output 1000 ml  Net -252.94 ml     Physical Exam  Awake Alert, No new F.N deficits, Normal affect [place].AT,PERRAL Supple Neck,No JVD, No cervical lymphadenopathy appriciated.  Symmetrical Chest wall movement, Good air movement bilaterally, CTAB iRRR,No Gallops, Rubs or new Murmurs, No Parasternal Heave +ve B.Sounds, Abd Soft, No tenderness, No organomegaly appriciated, No rebound - guarding or rigidity. No Cyanosis, Clubbing or edema, No new Rash or bruise    Data Review:    CBC Recent Labs  Lab 12/24/20 0555 12/25/20 0624 12/26/20 0658 12/27/20 0412 12/28/20 0557  WBC 8.9 6.8 10.6* 10.5 9.8  HGB 12.3* 12.6* 14.2 13.2 13.7  HCT 36.6* 37.3* 40.8 38.4* 39.2  PLT 158 174 215 185 183  MCV 101.7* 100.8* 102.0* 101.1* 101.8*  MCH 34.2* 34.1* 35.5* 34.7* 35.6*  MCHC 33.6 33.8 34.8 34.4 34.9  RDW 13.2 13.0 13.0 12.9 12.5  LYMPHSABS 0.3* 0.3* 0.5*  0.6* 0.5*  MONOABS 0.3 0.2 0.3 0.4 0.2  EOSABS 0.0 0.0 0.0 0.0 0.0  BASOSABS 0.0 0.0 0.0 0.0 0.0    Recent Labs  Lab 12/23/20 1347 12/24/20 0555 12/24/20 0903 12/25/20 0624 12/26/20 0658 12/27/20 0412 12/28/20 0557  NA 133* 136  --  135 139 137 138  K 4.4 5.0  --  4.3 3.9 4.3 4.4  CL 101 101  --  101 99 103 99  CO2 23 28  --  24 29 26 31   GLUCOSE 104* 97  --  169* 123* 144* 108*  BUN 47* 38*  --  39* 53* 65* 56*  CREATININE 2.07* 1.70*  --  1.57* 1.79* 1.81* 1.77*  CALCIUM 8.2* 8.5*  --  8.1* 8.4* 7.8* 8.4*  AST 38 33  --  26 29 28  69*  ALT 23 22  --  22 35 32 109*  ALKPHOS 35* 31*  --  28* 34* 34* 37*  BILITOT 0.7 0.6  --  0.7 0.8 0.7 0.6  ALBUMIN 3.4* 3.3*  --  2.7* 3.0* 2.8* 3.0*  MG  --  2.1  --  2.1 2.1 2.3  --   CRP  --  19.5*  --  12.4* 7.5* 5.3*  --   DDIMER  --   --  1.23* 0.69* 0.62* 0.51*  --   PROCALCITON 0.71  --   --   --   --   --   --   TSH  --   --   --   --   --  0.462  --   BNP 108.0* 88.4  --  635.8* 471.3* 417.5* 175.8*    ------------------------------------------------------------------------------------------------------------------ No results for input(s): CHOL, HDL, LDLCALC, TRIG, CHOLHDL, LDLDIRECT in the last 72 hours.  No results found for: HGBA1C ------------------------------------------------------------------------------------------------------------------ Recent Labs    12/27/20 0412  TSH 0.462    Cardiac Enzymes No results for input(s): CKMB, TROPONINI, MYOGLOBIN in the last 168 hours.  Invalid input(s): CK ------------------------------------------------------------------------------------------------------------------    Component Value Date/Time   BNP 175.8 (H) 12/28/2020 0557     Radiology Reports DG Chest Port 1 View  Result Date: 12/27/2020 CLINICAL DATA:  Shortness of breath EXAM: PORTABLE CHEST 1 VIEW COMPARISON:  Two days ago FINDINGS: Unchanged infiltrates at the right more than left base with hazy and fine  interstitial pattern. No effusion or pneumothorax. Normal heart size and mediastinal contours. IMPRESSION: Stable inferior infiltrates. Electronically Signed   By: Monte Fantasia M.D.   On: 12/27/2020 08:43   DG Chest Port 1 View  Result Date: 12/25/2020 CLINICAL DATA:  Continued shortness of breath.  COVID. EXAM: PORTABLE CHEST 1 VIEW COMPARISON:  Yesterday FINDINGS: Stable infiltrates at the lung bases. Apical emphysematous markings. Normal heart size and mediastinal contours. No visible effusion or pneumothorax. IMPRESSION: Stable lower lobe infiltrates. Electronically Signed   By: Monte Fantasia M.D.   On: 12/25/2020 07:55   DG Chest Port 1 View  Result Date: 12/24/2020 CLINICAL DATA:  COVID and shortness of breath EXAM: PORTABLE CHEST 1 VIEW COMPARISON:  Yesterday FINDINGS: Reticular opacities at the bases which are increased. No fibrosis on a February 2022 chest CT. No edema, effusion, or pneumothorax. Normal heart size and mediastinal contours. IMPRESSION: Increased infiltrates at the lung bases. Electronically Signed   By: Monte Fantasia M.D.   On: 12/24/2020 07:37   DG Chest Portable 1 View  Result Date: 12/23/2020 CLINICAL DATA:  Shortness of breath EXAM: PORTABLE CHEST 1 VIEW COMPARISON:  Radiograph 06/21/2020, chest CT 06/21/2020 FINDINGS: Unchanged cardiomediastinal silhouette. There is unchanged apical predominant emphysema. There are bibasilar opacities. No large pleural effusion or visible pneumothorax. Bilateral shoulder degenerative changes. No acute osseous abnormality. Partially visualized cervical spine fusion hardware. IMPRESSION: Bibasilar opacities which could represent an infectious inflammatory process. Emphysema. Electronically Signed   By: Maurine Simmering M.D.   On: 12/23/2020 14:34   DG Abd Portable 1V  Result Date: 12/24/2020 CLINICAL DATA:  Nausea and vomiting EXAM: PORTABLE ABDOMEN - 1 VIEW COMPARISON:  11/17/2019 FINDINGS: 2 supine frontal views of the abdomen and  pelvis are obtained. Portions of the right flank and right hemidiaphragm are excluded by collimation. The bowel gas pattern is unremarkable without obstruction or ileus. No masses or abnormal calcifications. Patchy bibasilar airspace disease again noted, not appreciably changed since preceding chest x-ray. IMPRESSION: 1. Unremarkable bowel gas pattern. 2. Bibasilar airspace disease unchanged. Electronically Signed   By: Randa Ngo M.D.   On: 12/24/2020 16:22   ECHOCARDIOGRAM COMPLETE  Result Date: 12/25/2020    ECHOCARDIOGRAM REPORT   Patient Name:   MCGREGOR TINNON Cuaresma Date of Exam: 12/25/2020 Medical Rec #:  [id number]          Height:       72.0 in Accession #:    [id number]  Weight:       152.6 lb Date of Birth:  04-27-1946          BSA:          1.899 m Patient Age:    77 years           BP:           112/74 mmHg Patient Gender: M                  HR:           112 bpm. Exam Location:  [facility] Procedure: 2D Echo Indications:     Acute Respiratory Distress R06.03  History:         Patient has prior history of Echocardiogram examinations, most                  recent 06/21/2020.  Sonographer:     Kathlen Brunswick RDCS Referring Phys:  [id number] Flora Lipps Diagnosing Phys: Ida Rogue MD IMPRESSIONS  1. Left ventricular ejection fraction, by estimation, is 60 to 65%. The left ventricle has normal function. The left ventricle has no regional wall motion abnormalities. Left ventricular diastolic parameters are indeterminate.  2. Right ventricular systolic function is normal. The right ventricular size is normal. Tricuspid regurgitation signal is inadequate for assessing PA pressure.  3. The mitral valve is normal in structure. Mild mitral valve regurgitation.  4. The aortic valve was not well visualized. Aortic valve regurgitation is not visualized. Mild to moderate aortic valve sclerosis/calcification is present, unable to exclude mild stenosis, gradient was not measured. FINDINGS  Left Ventricle: Left  ventricular ejection fraction, by estimation, is 60 to 65%. The left ventricle has normal function. The left ventricle has no regional wall motion abnormalities. The left ventricular internal cavity size was normal in size. There is  no left ventricular hypertrophy. Left ventricular diastolic parameters are indeterminate. Right Ventricle: The right ventricular size is normal. No increase in right ventricular wall thickness. Right ventricular systolic function is normal. Tricuspid regurgitation signal is inadequate for assessing PA pressure. Left Atrium: Left atrial size was normal in size. Right Atrium: Right atrial size was normal in size. Pericardium: There is no evidence of pericardial effusion. Mitral Valve: The mitral valve is normal in structure. Mild mitral valve regurgitation. No evidence of mitral valve stenosis. Tricuspid Valve: The tricuspid valve is normal in structure. Tricuspid valve regurgitation is mild . No evidence of tricuspid stenosis. Aortic Valve: The aortic valve was not well visualized. Aortic valve regurgitation is not visualized. Mild to moderate aortic valve sclerosis/calcification is present, unable to exclude mild stenosis, gradient was not measured.  Pulmonic Valve: The pulmonic valve was normal in structure. Pulmonic valve regurgitation is not visualized. No evidence of pulmonic stenosis. Aorta: The aortic root is normal in size and structure. Venous: The pulmonary veins were not well visualized. The inferior vena cava was not well visualized. The inferior vena cava is normal in size with greater than 50% respiratory variability, suggesting right atrial pressure of 3 mmHg. IAS/Shunts: No atrial level shunt detected by color flow Doppler.  LEFT VENTRICLE PLAX 2D LVIDd:         4.31 cm  Diastology LVIDs:         3.07 cm  LV e' medial:    12.10 cm/s LV PW:         1.33 cm  LV E/e' medial:  6.2 LV IVS:        1.10 cm  LV  e' lateral:   15.00 cm/s LVOT diam:     1.80 cm  LV E/e' lateral: 5.0  LV SV:         29 LV SV Index:   15 LVOT Area:     2.54 cm  RIGHT VENTRICLE RV Basal diam:  3.64 cm RV S prime:     19.70 cm/s TAPSE (M-mode): 1.9 cm LEFT ATRIUM             Index       RIGHT ATRIUM           Index LA diam:        3.00 cm 1.58 cm/m  RA Area:     15.20 cm LA Vol (A2C):   29.5 ml 15.53 ml/m RA Volume:   41.20 ml  21.70 ml/m LA Vol (A4C):   26.9 ml 14.17 ml/m LA Biplane Vol: 29.9 ml 15.75 ml/m  AORTIC VALVE             PULMONIC VALVE LVOT Vmax:   65.90 cm/s  PV Vmax:       1.11 m/s LVOT Vmean:  45.100 cm/s PV Peak grad:  4.9 mmHg LVOT VTI:    0.114 m  AORTA Ao Root diam: 3.20 cm Ao Asc diam:  3.40 cm MITRAL VALVE               TRICUSPID VALVE MV Area (PHT): 4.86 cm    TV Peak grad:   15.5 mmHg MV Decel Time: 156 msec    TV Vmax:        1.97 m/s MV E velocity: 75.40 cm/s                            SHUNTS                            Systemic VTI:  0.11 m                            Systemic Diam: 1.80 cm Ida Rogue MD Electronically signed by Ida Rogue MD Signature Date/Time: 12/25/2020/12:36:16 PM    Final

## 2020-12-28 NOTE — Progress Notes (Signed)
Occupational Therapy Treatment Patient Details Name: Riley Martin MRN: [id number] DOB: 1945-06-27 Today's Date: 12/28/2020    History of present illness Pt is a 75 y.o. male presenting to hospital 8/18 with SOB (increasing x10 days); chest discomfort for 2 weeks.  Home test (+) COVID-19 on August 8th.  Pt admitted with acute hypoxic and hypercapnic respiratory failure secondary to COVID-19 viral PNA with severe COPD exacerbation.   PMH includes HLD, a-fib on Eliquis, CKD, GERD, COPD, orthostatic dizziness, L torn rotator cuff, IBS with constipation, and neck surgery.   OT comments  Upon entering the room, pt seated on EOB with son in room. Pt finishing ice cream and O2 saturation remains above 90%. Focus on functional mobility and breathing strategies. Pt standing x 10 reps with supervision - mod I from EOB. Pt with increased speed when completing tasks and O2 saturation decreased to 81%. Pt recovered after a few minutes of pursed lip breathing while still on 5 Ls. OT provided education regarding energy conservation for home with self care tasks. Pt verbalized understanding with education to continue. Pt then instructed to stand up from bed, hold it for 10 seconds,return to bed, and count to 10 again before standing while focusing on breathing. Pt performed sit <>stand x 10 reps and this time able to maintain O2 above 88%. Pt reports fatigue and requesting to return to bed at end of session. Bed mobility without assistance and all needs within reach. Pt continues to benefit from OT intervention.    Follow Up Recommendations  Home health OT;Supervision - Intermittent    Equipment Recommendations  3 in 1 bedside commode;Tub/shower seat       Precautions / Restrictions Precautions Precautions: Fall       Mobility Bed Mobility Overal bed mobility: Modified Independent                  Transfers Overall transfer level: Modified independent Equipment used: None Transfers: Sit  to/from [facility] Sit to Stand: Modified independent (Device/Increase time) Stand pivot transfers: Supervision       General transfer comment: no physical assistance needed    Balance Overall balance assessment: Needs assistance Sitting-balance support: No upper extremity supported;Feet supported Sitting balance-Leahy Scale: Normal Sitting balance - Comments: normal static sitting, G dynamic sitting   Standing balance support: No upper extremity supported;During functional activity Standing balance-Leahy Scale: Good                             ADL either performed or assessed with clinical judgement     Vision Patient Visual Report: No change from baseline            Cognition Arousal/Alertness: Awake/alert Behavior During Therapy: WFL for tasks assessed/performed Overall Cognitive Status: Within Functional Limits for tasks assessed                                                     Pertinent Vitals/ Pain       Pain Assessment: No/denies pain  Home Living Family/patient expects to be discharged to:: Private residence  Frequency  Min 2X/week        Progress Toward Goals  OT Goals(current goals can now be found in the care plan section)  Progress towards OT goals: Progressing toward goals  Acute Rehab OT Goals Patient Stated Goal: to go home OT Goal Formulation: With patient Time For Goal Achievement: 01/08/21 Potential to Achieve Goals: Good  Plan Discharge plan remains appropriate       AM-PAC OT "6 Clicks" Daily Activity     Outcome Measure   Help from another person eating meals?: None Help from another person taking care of personal grooming?: None Help from another person toileting, which includes using toliet, bedpan, or urinal?: A Little Help from another person bathing (including washing, rinsing, drying)?: A Little Help from  another person to put on and taking off regular upper body clothing?: None Help from another person to put on and taking off regular lower body clothing?: A Little 6 Click Score: 21    End of Session Equipment Utilized During Treatment: Oxygen (5Ls)  OT Visit Diagnosis: Unsteadiness on feet (R26.81);Muscle weakness (generalized) (M62.81)   Activity Tolerance Patient tolerated treatment well   Patient Left in bed;with call bell/phone within reach   Nurse Communication Mobility status;Other (comment) (O2 desaturation)        Time: 9629-5284 OT Time Calculation (min): 30 min  Charges: OT General Charges $OT Visit: 1 Visit OT Treatments $Therapeutic Activity: 23-37 mins  Darleen Crocker, MS, OTR/L , CBIS ascom [phone]  12/28/20, 3:23 PM

## 2020-12-29 ENCOUNTER — Encounter: Payer: Self-pay | Admitting: Family Medicine

## 2020-12-29 LAB — COMPREHENSIVE METABOLIC PANEL
ALT: 144 U/L — ABNORMAL HIGH (ref 0–44)
AST: 73 U/L — ABNORMAL HIGH (ref 15–41)
Albumin: 3.1 g/dL — ABNORMAL LOW (ref 3.5–5.0)
Alkaline Phosphatase: 39 U/L (ref 38–126)
Anion gap: 10 (ref 5–15)
BUN: 52 mg/dL — ABNORMAL HIGH (ref 8–23)
CO2: 28 mmol/L (ref 22–32)
Calcium: 8.4 mg/dL — ABNORMAL LOW (ref 8.9–10.3)
Chloride: 99 mmol/L (ref 98–111)
Creatinine, Ser: 1.75 mg/dL — ABNORMAL HIGH (ref 0.61–1.24)
GFR, Estimated: 40 mL/min — ABNORMAL LOW (ref >60)
Glucose, Bld: 100 mg/dL — ABNORMAL HIGH (ref 70–99)
Potassium: 4.5 mmol/L (ref 3.5–5.1)
Sodium: 137 mmol/L (ref 135–145)
Total Bilirubin: 0.8 mg/dL (ref 0.3–1.2)
Total Protein: 5.9 g/dL — ABNORMAL LOW (ref 6.5–8.1)

## 2020-12-29 LAB — CBC WITH DIFFERENTIAL/PLATELET
Abs Immature Granulocytes: 0.12 10*3/uL — ABNORMAL HIGH (ref 0.00–0.07)
Basophils Absolute: 0 10*3/uL (ref 0.0–0.1)
Basophils Relative: 0 %
Eosinophils Absolute: 0 10*3/uL (ref 0.0–0.5)
Eosinophils Relative: 0 %
HCT: 41.1 % (ref 39.0–52.0)
Hemoglobin: 14 g/dL (ref 13.0–17.0)
Immature Granulocytes: 1 %
Lymphocytes Relative: 7 %
Lymphs Abs: 0.8 10*3/uL (ref 0.7–4.0)
MCH: 35.3 pg — ABNORMAL HIGH (ref 26.0–34.0)
MCHC: 34.1 g/dL (ref 30.0–36.0)
MCV: 103.5 fL — ABNORMAL HIGH (ref 80.0–100.0)
Monocytes Absolute: 0.3 10*3/uL (ref 0.1–1.0)
Monocytes Relative: 3 %
Neutro Abs: 10.7 10*3/uL — ABNORMAL HIGH (ref 1.7–7.7)
Neutrophils Relative %: 89 %
Platelets: 175 10*3/uL (ref 150–400)
RBC: 3.97 MIL/uL — ABNORMAL LOW (ref 4.22–5.81)
RDW: 12.6 % (ref 11.5–15.5)
WBC: 12 10*3/uL — ABNORMAL HIGH (ref 4.0–10.5)
nRBC: 0 % (ref 0.0–0.2)

## 2020-12-29 LAB — GLUCOSE, CAPILLARY: Glucose-Capillary: 161 mg/dL — ABNORMAL HIGH (ref 70–99)

## 2020-12-29 LAB — C-REACTIVE PROTEIN: CRP: 2.3 mg/dL — ABNORMAL HIGH (ref <1.0)

## 2020-12-29 LAB — BRAIN NATRIURETIC PEPTIDE: B Natriuretic Peptide: 160.1 pg/mL — ABNORMAL HIGH (ref 0.0–100.0)

## 2020-12-29 MED ORDER — SALINE SPRAY 0.65 % NA SOLN: 1.0000 | NASAL | 1 refills | Status: AC | PRN

## 2020-12-29 MED ORDER — BLOOD GLUCOSE MONITOR KIT: PACK | 0 refills | Status: DC

## 2020-12-29 MED ORDER — MOMETASONE FURO-FORMOTEROL FUM 200-5 MCG/ACT IN AERO: 2.0000 | INHALATION_SPRAY | Freq: Two times a day (BID) | RESPIRATORY_TRACT | 1 refills | Status: AC

## 2020-12-29 MED ORDER — ADULT MULTIVITAMIN W/MINERALS CH: 1.0000 | ORAL_TABLET | Freq: Every day | ORAL | Status: AC

## 2020-12-29 MED ORDER — PREDNISONE 10 MG PO TABS: ORAL_TABLET | ORAL | 0 refills | Status: AC

## 2020-12-29 MED ORDER — RIVAROXABAN 15 MG PO TABS: 15.0000 mg | ORAL_TABLET | Freq: Every day | ORAL | 1 refills | Status: DC

## 2020-12-29 MED ORDER — METOPROLOL TARTRATE 50 MG PO TABS: 50.0000 mg | ORAL_TABLET | Freq: Two times a day (BID) | ORAL | 1 refills | Status: DC

## 2020-12-29 MED ORDER — PEN NEEDLES 31G X 6 MM MISC: 1.0000 | Freq: Three times a day (TID) | 0 refills | Status: DC

## 2020-12-29 MED ORDER — INSULIN LISPRO (1 UNIT DIAL) 100 UNIT/ML (KWIKPEN): 0.0000 [IU] | PEN_INJECTOR | Freq: Three times a day (TID) | SUBCUTANEOUS | 0 refills | Status: DC

## 2020-12-29 MED ORDER — SALINE SPRAY 0.65 % NA SOLN
1.0000 | NASAL | Status: DC | PRN
  Filled 2020-12-29 (×2): qty 44

## 2020-12-29 MED ORDER — NEPRO/CARBSTEADY PO LIQD: 237.0000 mL | Freq: Three times a day (TID) | ORAL | 0 refills | Status: AC

## 2020-12-29 MED ORDER — DM-GUAIFENESIN ER 30-600 MG PO TB12: 1.0000 | ORAL_TABLET | Freq: Two times a day (BID) | ORAL | 0 refills | Status: AC

## 2020-12-29 MED ORDER — AMIODARONE HCL 200 MG PO TABS: 200.0000 mg | ORAL_TABLET | Freq: Two times a day (BID) | ORAL | 1 refills | Status: DC

## 2020-12-29 MED ORDER — ACETAMINOPHEN 325 MG PO TABS: 650.0000 mg | ORAL_TABLET | Freq: Four times a day (QID) | ORAL | Status: DC | PRN

## 2020-12-29 MED ORDER — TIOTROPIUM BROMIDE MONOHYDRATE 18 MCG IN CAPS
18.0000 ug | ORAL_CAPSULE | Freq: Every day | RESPIRATORY_TRACT | 1 refills | Status: DC
Start: 2020-12-30 — End: 2022-05-04

## 2020-12-29 NOTE — TOC Initial Note (Signed)
Transition of Care [facility]) - Initial/Assessment Note    Patient Details  Name: Riley Martin MRN: [id number] Date of Birth: [birth year]  [facility] CM/SW Contact:    Beverly Sessions, RN Phone Number: 12/29/2020, 1:19 PM  Clinical Narrative:                 Patient admitted from home with covid Assessment completed via phone with patient due to isolation precautions Patient states that he lives at home with wife  PCP [place] - [facility] baseline [facility] - denies issues obtaining medication   Patient to discharge today.  Patient agreeable to home health state he does not have a preference of home health agency.  Referral made to [facility] with [facility]  Patient to discharge with home O2.  To be delivered room by [place] with [employer]  Son to transport at discharge   Expected Discharge Plan: [place] Barriers to Discharge: Barriers Resolved   Patient Goals and CMS Choice        Expected Discharge Plan and Services Expected Discharge Plan: [place]       Living arrangements for the past 2 months: Single Family Home Expected Discharge Date: 12/29/20               DME Arranged: Oxygen DME Agency: [employer]     Representative spoke with at DME Agency: Faythe Dingwall HH Arranged: RN, OT, PT [facility] Agency: [place] Date [place]: 12/29/20   Representative spoke with at [place]: Tommi Rumps  Prior Living Arrangements/Services Living arrangements for the past 2 months: Agency Lives with:: Spouse Patient language and need for interpreter reviewed:: Yes Do you feel safe going back to the place where you live?: Yes      Need for Family Participation in Patient Care: Yes (Comment) Care giver support system in place?: Yes (comment)   Criminal Activity/Legal Involvement Pertinent to Current Situation/Hospitalization: No - Comment as needed  Activities of Daily Living Home Assistive  Devices/Equipment: Eyeglasses, Dentures (specify type) (upper) ADL Screening (condition at time of admission) Patient's cognitive ability adequate to safely complete daily activities?: Yes Is the patient deaf or have difficulty hearing?: No Does the patient have difficulty seeing, even when wearing glasses/contacts?: No Does the patient have difficulty concentrating, remembering, or making decisions?: No Patient able to express need for assistance with ADLs?: Yes Does the patient have difficulty dressing or bathing?: No Independently performs ADLs?: Yes (appropriate for developmental age) Does the patient have difficulty walking or climbing stairs?: No Weakness of Legs: None Weakness of Arms/Hands: None  Permission Sought/Granted                  Emotional Assessment       Orientation: : Oriented to Self, Oriented to Place, Oriented to  Time, Oriented to Situation Alcohol / Substance Use: Not Applicable Psych Involvement: No (comment)  Admission diagnosis:  COPD exacerbation ([place]) [J44.1] Acute respiratory failure with hypoxia ([place]) [J96.01] Community acquired pneumonia, unspecified laterality [J18.9] COVID-19 [U07.1] Patient Active Problem List   Diagnosis Date Noted   COVID-19 12/23/2020   Atrial fibrillation with rapid ventricular response ([place]) 06/21/2020   BPH (benign prostatic hyperplasia) 09/13/2016   Spinal stenosis 09/13/2016   Tobacco dependence 09/13/2016   History of normocytic normochromic anemia 06/21/2016   Vaccine counseling 06/21/2016   Essential hypertriglyceridemia 04/02/2015   History of adenomatous polyp of colon 02/05/2015   History of gastroesophageal  reflux (GERD) 02/05/2015   History of insomnia 02/05/2015   Irritable bowel syndrome with constipation 02/05/2015   PCP:  [name], MD Pharmacy:   [id number] [place], [place] [address] [address] Phone: [phone] Fax: [phone]     Social  Determinants of Health (SDOH) Interventions    Readmission Risk Interventions Readmission Risk Prevention Plan 12/29/2020  Transportation Screening Complete  HRI or Home Care Consult Complete  Social Work Consult for [place] Planning/Counseling Complete  Palliative Care Screening Not Applicable  Medication Review Press photographer) Complete  Some recent data might be hidden

## 2020-12-29 NOTE — Discharge Instructions (Signed)
While you are on steroids, your blood sugars may continue running high.  We use insulin to lower blood sugars that are high from taking steroids. You should continue to monitor your blood sugars and use insulin as prescribed until your sugars are controlled without it. We expect you will not need insulin after you are finished with steroids (Prednisone).  Take # units of insulin THREE times daily WITH MEALS as follows:  Take 0-9 units 3 times daily with meals as follows: If CBG 70-120: 0 units If CBG 121-150: 1 unit If CBG 151-200: 2 units If CBG 201-250: 3 units If CBG 251-300: 5 units If CBG 301-350: 7 units If CBG 351-400: 9 units If CBG over 400: 12 units and call MD

## 2020-12-29 NOTE — Progress Notes (Signed)
Patient discharging with home oxygen, patient given instructions to keep follow up appointments, when to return for worsening symptoms, IV taken out, medication and sliding scale insulin information given to the patient, & awaiting patient transport.

## 2020-12-29 NOTE — Progress Notes (Signed)
[facility] Cardiology  SUBJECTIVE: Patient laying in bed, denies chest pain, shortness of breath, palpitations, wishes to go home   Vitals:   12/29/20 0100 12/29/20 0200 12/29/20 0246 12/29/20 0852  BP: (!) 104/45 135/60 (!) 158/74 (!) 143/69  Pulse: 70  (!) 54 72  Resp: 13 12 20 18   Temp:  (!) 97.4 F (36.3 C) 98.5 F (36.9 C) 98.7 F (37.1 C)  TempSrc:  Axillary Oral Oral  SpO2: 95%  92% 94%  Weight:      Height:         Intake/Output Summary (Last 24 hours) at 12/29/2020 0940 Last data filed at 12/29/2020 0737 Gross per 24 hour  Intake 153.49 ml  Output 900 ml  Net -746.51 ml      PHYSICAL EXAM  General: Well developed, well nourished, in no acute distress HEENT:  Normocephalic and atramatic Neck:  No JVD.  Lungs: Clear bilaterally to auscultation and percussion. Heart: HRRR . Normal S1 and S2 without gallops or murmurs.  Abdomen: Bowel sounds are positive, abdomen soft and non-tender  Msk:  Back normal, normal gait. Normal strength and tone for age. Extremities: No clubbing, cyanosis or edema.   Neuro: Alert and oriented X 3. Psych:  Good affect, responds appropriately   LABS: Basic Metabolic Panel: Recent Labs    12/27/20 0412 12/28/20 0557 12/29/20 0541  NA 137 138 137  K 4.3 4.4 4.5  CL 103 99 99  CO2 26 31 28   GLUCOSE 144* 108* 100*  BUN 65* 56* 52*  CREATININE 1.81* 1.77* 1.75*  CALCIUM 7.8* 8.4* 8.4*  MG 2.3  --   --   PHOS 4.6  --   --    Liver Function Tests: Recent Labs    12/28/20 0557 12/29/20 0541  AST 69* 73*  ALT 109* 144*  ALKPHOS 37* 39  BILITOT 0.6 0.8  PROT 6.0* 5.9*  ALBUMIN 3.0* 3.1*   No results for input(s): LIPASE, AMYLASE in the last 72 hours. CBC: Recent Labs    12/28/20 0557 12/29/20 0541  WBC 9.8 12.0*  NEUTROABS 9.0* 10.7*  HGB 13.7 14.0  HCT 39.2 41.1  MCV 101.8* 103.5*  PLT 183 175   Cardiac Enzymes: No results for input(s): CKTOTAL, CKMB, CKMBINDEX, TROPONINI in the last 72 hours. BNP: Invalid  input(s): POCBNP D-Dimer: Recent Labs    12/27/20 0412  DDIMER 0.51*   Hemoglobin A1C: No results for input(s): HGBA1C in the last 72 hours. Fasting Lipid Panel: No results for input(s): CHOL, HDL, LDLCALC, TRIG, CHOLHDL, LDLDIRECT in the last 72 hours. Thyroid Function Tests: Recent Labs    12/27/20 0412  TSH 0.462   Anemia Panel: Recent Labs    12/27/20 0412  FERRITIN 118    No results found.   Echo LVEF 60 to 65%  TELEMETRY: Sinus rhythm with unifocal PVCs:  ASSESSMENT AND PLAN:  Active Problems:   COVID-19    1.  Atrial fibrillation with rapid ventricular rate, exacerbated by COVID-19 pneumonia and nausea, on Xarelto for stroke prevention, converted to sinus rhythm on amiodarone bolus and infusion, on metoprolol to tartrate for rate control. 2.  Respiratory failure, secondary to COVID-19 viral pneumonitis, previously on BiPAP, treated with broad-spectrum antibiotics, remdesivir, and IV steroids, respiratory status much improved, now on O2 by nasal cannula   Recommendations   1.  Agree with current therapy 2.  Continue Xarelto for stroke prevention 3.  Continue metoprolol to tartrate 50 mg twice daily for rate control 4.  Continue  amiodarone 200 mg twice daily 5.  Defer further cardiac diagnostics at this time 6.  Follow-up in 1 week at which time we will taper amiodarone to 200 mg daily  Sign off for now, please call if any questions   Isaias Cowman, MD, PhD, [facility] 12/29/2020 9:40 AM

## 2020-12-29 NOTE — Progress Notes (Signed)
88% on room air at rest & 87% ambulating  90-92% on 4 liters at rest  87-93% on 4 liters ambulating

## 2020-12-29 NOTE — Discharge Summary (Signed)
Physician Discharge Summary  Riley Martin DTO:[id number] DOB: [birth year] DOA: 12/23/2020  PCP: [name], MD  Admit date: 12/23/2020 Discharge date: 12/29/2020  Admitted From: home Disposition:  home  Recommendations for Outpatient Follow-up:  Follow up with PCP in 1-2 weeks Please obtain BMP/CBC in one week Please follow up on patient's glycemic control given steroid-induced hyperglycemia requiring insulin to manage. Follow up on oxygen requirement.  Needing 4 L/min at rest and with ambulation at time of discharge. Follow up with cardiology as scheduled  Home Health: PT, OT, RN  Equipment/Devices: Oxygen   Discharge Condition: Stable  CODE STATUS: Full  Diet recommendation: Soft diet  Discharge Diagnoses: Active Problems:   COVID-19    Summary of HPI and Hospital Course:  "Riley Martin is a 75 y.o. male with a past medical history of COPD not on home oxygen, hyperlipidemia, A. fib on Xarelto, chronic kidney disease, GERD, former smoker.   The patient presents to the emergency department due to shortness of breath that has been going on for about a week and a half now.  He tested positive for COVID-19 at home on August 8.  He is unfortunately not vaccinated, he was diagnosed with severe acute hypoxic respiratory failure due to COVID-19 pneumonia and admitted to the hospital on BiPAP."    Acute Hypoxic Resp. Failure due to Acute Covid 19 Viral Pneumonitis during the ongoing 2020 Covid 19 Pandemic. Pt not vaccinated.  Has severe parenchymal lung injury with severe hypoxia requiring BiPAP at the time of admission >> 45 L heated high flow.  Since weaned to 4-5 L/min nasal cannula oxygen.   Treated with IV Steroids, Remdesivir and given Actemra on 12/24/2020 after appropriate informed consent.   Discharge with prednisone taper over 8 days starting at 40 mg tomorrow, stepping down by 10 mg every two days. Also discharged with sliding scale insulin for  steroid-induced hyperglycemia.   Covered inpatient with basal and short-acting.   Will avoid the long-acting at time of discharge to prevent hypoglycemia with steroid dose coming down over next several days.  Advised patient to be sure to closely follow up with his primary care provider.   Patient is clinically improved and stable on 4 L/min oxygen, ready for discharge home with home health services arranged as recommended by PT and OT.       COPD with mild exacerbation triggered by Covid infection.  At time of discharge, no wheezing or signs of exacerbation.  On steroids as above.   History of paroxysmal A. fib with [place] vas 2 score of greater than 3.   Continue on Lopressor and Xarelto He was treated for RVR with few doses of digoxin and  IV amiodarone drip.   Transitioned to PO amiodarone 200 mg BID.   Cardiology consulted. Echocardiogram stable with preserved EF of 60%, stable TSH.   BPH.  On Flomax continue.   GERD.  On PPI.   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   Chronic Pain - patient on narcotics at baseline, reports he goes into withdrawals without them.   He was continued on his usual home medications ordered with PRN Narcan.  Counseled regarding risks and to avoid overuse.    Discharge Instructions   Discharge Instructions     Call MD for:   Complete by: As directed    Worsening shortness of breath.  If having to turn oxygen up past 4 L/min to keep your oxygen levels above 88%.  Low  blood sugars (less than 80) High blood sugars (above 200)   Call MD for:  extreme fatigue   Complete by: As directed    Call MD for:  persistant dizziness or light-headedness   Complete by: As directed    Call MD for:  persistant nausea and vomiting   Complete by: As directed    Call MD for:  severe uncontrolled pain   Complete by: As directed    Call MD for:  temperature >100.4   Complete by: As directed    Diet - low sodium heart healthy   Complete by: As  directed    Discharge instructions   Complete by: As directed    I've prescribed a steroid taper (Prednisone) over the next 8 days.   You will take 40 mg tomorrow and Friday, then step down by 10 mg every two days.  While on steroids, your blood sugar can run high.  I've prescribed an insulin "pen" for you to continue using insulin while you remain on steroids.  As the dose of steroid comes down, we expect you will need less insulin, and eventually be able to stop using it completely.  Please contact your primary care provider if any questions or concerns about using insulin or about your blood sugars.   Increase activity slowly   Complete by: As directed       Allergies as of 12/29/2020       Reactions   Iodinated Diagnostic Agents Other (See Comments), Nausea Only   Dry heaves, patient states he felt like he was on fire and about to pass out.  Pre-syncope, burning  Dry heaves, patient states he felt like he was on fire and about to pass out.    Lisinopril Swelling   Tongue swelling, neck pain and Headaches    Tramadol Other (See Comments), Shortness Of Breath   Other reaction(s): Other (See Comments) Other Reaction: tachy Dizziness and unsteady gait.   Pregabalin Palpitations   Other reaction(s): Other (See Comments) Other Reaction: edema        Medication List     TAKE these medications    amiodarone 200 MG tablet Commonly known as: PACERONE Take 1 tablet (200 mg total) by mouth 2 (two) times daily.   blood glucose meter kit and supplies Kit Dispense based on patient and insurance preference. Use up to four times daily as directed.   dextromethorphan-guaiFENesin 30-600 MG 12hr tablet Commonly known as: MUCINEX DM Take 1 tablet by mouth 2 (two) times daily for 7 days.   feeding supplement (NEPRO CARB STEADY) Liqd Take 237 mLs by mouth 3 (three) times daily between meals.   fluticasone 50 MCG/ACT nasal spray Commonly known as: FLONASE SPRAY 2 SPRAYS INTO EACH  NOSTRIL EVERY DAY   insulin lispro 100 UNIT/ML KwikPen Commonly known as: HumaLOG KwikPen Inject 0-9 Units into the skin 3 (three) times daily with meals. Per Sliding Scale Instructions provided   megestrol 625 MG/5ML suspension Commonly known as: MEGACE ES Take 625 mg by mouth daily.   metoprolol tartrate 50 MG tablet Commonly known as: LOPRESSOR Take 1 tablet (50 mg total) by mouth 2 (two) times daily. What changed:  medication strength how much to take   mirtazapine 15 MG tablet Commonly known as: REMERON Take 15 mg by mouth at bedtime.   mometasone-formoterol 200-5 MCG/ACT Aero Commonly known as: DULERA Inhale 2 puffs into the lungs 2 (two) times daily.   multivitamin with minerals Tabs tablet Take 1 tablet by mouth  daily. Start taking on: December 30, 2020   oxyCODONE-acetaminophen 10-325 MG tablet Commonly known as: PERCOCET Take 1 tablet by mouth 5 (five) times daily as needed.   OxyCONTIN 10 mg 12 hr tablet Generic drug: oxyCODONE Take 10 mg by mouth 3 (three) times daily. What changed: Another medication with the same name was removed. Continue taking this medication, and follow the directions you see here.   pantoprazole 20 MG tablet Commonly known as: PROTONIX Take 20 mg by mouth daily.   Pen Needles 31G X 6 MM Misc 1 each by Does not apply route 3 (three) times daily with meals.   predniSONE 10 MG tablet Commonly known as: DELTASONE Take 4 tablets (40 mg total) by mouth daily for 2 days, THEN 3 tablets (30 mg total) daily for 2 days, THEN 2 tablets (20 mg total) daily for 2 days, THEN 1 tablet (10 mg total) daily for 2 days. Start taking on: December 29, 2020   Rivaroxaban 15 MG Tabs tablet Commonly known as: XARELTO Take 1 tablet (15 mg total) by mouth daily with supper. What changed:  medication strength how much to take when to take this   sodium chloride 0.65 % Soln nasal spray Commonly known as: OCEAN Place 1 spray into both nostrils as needed  for congestion.   tamsulosin 0.4 MG Caps capsule Commonly known as: FLOMAX Take 0.4 mg by mouth daily.   tiotropium 18 MCG inhalation capsule Commonly known as: SPIRIVA Place 1 capsule (18 mcg total) into inhaler and inhale daily. Start taking on: December 30, 2020   Ventolin HFA 108 (90 Base) MCG/ACT inhaler Generic drug: albuterol Inhale 2 puffs into the lungs every 6 (six) hours as needed.   albuterol (2.5 MG/3ML) 0.083% nebulizer solution Commonly known as: PROVENTIL Take 3 mLs by nebulization every 6 (six) hours as needed for wheezing or shortness of breath.               Durable Medical Equipment  (From admission, onward)           Start     Ordered   12/29/20 1015  For home use only DME oxygen  Once       Question Answer Comment  Length of Need 6 Months   Mode or (Route) Nasal cannula   Liters per Minute 4   Frequency Continuous (stationary and portable oxygen unit needed)   Oxygen delivery system Gas      12/29/20 1014            Allergies  Allergen Reactions   Iodinated Diagnostic Agents Other (See Comments) and Nausea Only    Dry heaves, patient states he felt like he was on fire and about to pass out.  Pre-syncope, burning  Dry heaves, patient states he felt like he was on fire and about to pass out.    Lisinopril Swelling    Tongue swelling, neck pain and Headaches    Tramadol Other (See Comments) and Shortness Of Breath    Other reaction(s): Other (See Comments) Other Reaction: tachy Dizziness and unsteady gait.   Pregabalin Palpitations    Other reaction(s): Other (See Comments) Other Reaction: edema     If you experience worsening of your admission symptoms, develop shortness of breath, life threatening emergency, suicidal or homicidal thoughts you must seek medical attention immediately by calling 911 or calling your MD immediately  if symptoms less severe.    Please note   You were cared for by a hospitalist during  your hospital  stay. If you have any questions about your discharge medications or the care you received while you were in the hospital after you are discharged, you can call the unit and asked to speak with the hospitalist on call if the hospitalist that took care of you is not available. Once you are discharged, your primary care physician will handle any further medical issues. Please note that NO REFILLS for any discharge medications will be authorized once you are discharged, as it is imperative that you return to your primary care physician (or establish a relationship with a primary care physician if you do not have one) for your aftercare needs so that they can reassess your need for medications and monitor your lab values.   Consultations: PCCM Cardiology    Procedures/Studies: [facility] Chest Port 1 View  Result Date: 12/27/2020 CLINICAL DATA:  Shortness of breath EXAM: PORTABLE CHEST 1 VIEW COMPARISON:  Two days ago FINDINGS: Unchanged infiltrates at the right more than left base with hazy and fine interstitial pattern. No effusion or pneumothorax. Normal heart size and mediastinal contours. IMPRESSION: Stable inferior infiltrates. Electronically Signed   By: Monte Fantasia M.D.   On: 12/27/2020 08:43   DG Chest Port 1 View  Result Date: 12/25/2020 CLINICAL DATA:  Continued shortness of breath.  COVID. EXAM: PORTABLE CHEST 1 VIEW COMPARISON:  Yesterday FINDINGS: Stable infiltrates at the lung bases. Apical emphysematous markings. Normal heart size and mediastinal contours. No visible effusion or pneumothorax. IMPRESSION: Stable lower lobe infiltrates. Electronically Signed   By: Monte Fantasia M.D.   On: 12/25/2020 07:55   DG Chest Port 1 View  Result Date: 12/24/2020 CLINICAL DATA:  COVID and shortness of breath EXAM: PORTABLE CHEST 1 VIEW COMPARISON:  Yesterday FINDINGS: Reticular opacities at the bases which are increased. No fibrosis on a February 2022 chest CT. No edema, effusion, or pneumothorax.  Normal heart size and mediastinal contours. IMPRESSION: Increased infiltrates at the lung bases. Electronically Signed   By: Monte Fantasia M.D.   On: 12/24/2020 07:37   DG Chest Portable 1 View  Result Date: 12/23/2020 CLINICAL DATA:  Shortness of breath EXAM: PORTABLE CHEST 1 VIEW COMPARISON:  Radiograph 06/21/2020, chest CT 06/21/2020 FINDINGS: Unchanged cardiomediastinal silhouette. There is unchanged apical predominant emphysema. There are bibasilar opacities. No large pleural effusion or visible pneumothorax. Bilateral shoulder degenerative changes. No acute osseous abnormality. Partially visualized cervical spine fusion hardware. IMPRESSION: Bibasilar opacities which could represent an infectious inflammatory process. Emphysema. Electronically Signed   By: Maurine Simmering M.D.   On: 12/23/2020 14:34   DG Abd Portable 1V  Result Date: 12/24/2020 CLINICAL DATA:  Nausea and vomiting EXAM: PORTABLE ABDOMEN - 1 VIEW COMPARISON:  11/17/2019 FINDINGS: 2 supine frontal views of the abdomen and pelvis are obtained. Portions of the right flank and right hemidiaphragm are excluded by collimation. The bowel gas pattern is unremarkable without obstruction or ileus. No masses or abnormal calcifications. Patchy bibasilar airspace disease again noted, not appreciably changed since preceding chest x-ray. IMPRESSION: 1. Unremarkable bowel gas pattern. 2. Bibasilar airspace disease unchanged. Electronically Signed   By: Randa Ngo M.D.   On: 12/24/2020 16:22   ECHOCARDIOGRAM COMPLETE  Result Date: 12/25/2020    ECHOCARDIOGRAM REPORT   Patient Name:   Riley Martin Date of Exam: 12/25/2020 Medical Rec #:  [id number]          Height:       72.0 in Accession #:    [id number]  Weight:       152.6 lb Date of Birth:  April 21, 1946          BSA:          1.899 m Patient Age:    59 years           BP:           112/74 mmHg Patient Gender: M                  HR:           112 bpm. Exam Location:  [facility] Procedure: 2D  Echo Indications:     Acute Respiratory Distress R06.03  History:         Patient has prior history of Echocardiogram examinations, most                  recent 06/21/2020.  Sonographer:     Kathlen Brunswick RDCS Referring Phys:  [id number] Flora Lipps Diagnosing Phys: Ida Rogue MD IMPRESSIONS  1. Left ventricular ejection fraction, by estimation, is 60 to 65%. The left ventricle has normal function. The left ventricle has no regional wall motion abnormalities. Left ventricular diastolic parameters are indeterminate.  2. Right ventricular systolic function is normal. The right ventricular size is normal. Tricuspid regurgitation signal is inadequate for assessing PA pressure.  3. The mitral valve is normal in structure. Mild mitral valve regurgitation.  4. The aortic valve was not well visualized. Aortic valve regurgitation is not visualized. Mild to moderate aortic valve sclerosis/calcification is present, unable to exclude mild stenosis, gradient was not measured. FINDINGS  Left Ventricle: Left ventricular ejection fraction, by estimation, is 60 to 65%. The left ventricle has normal function. The left ventricle has no regional wall motion abnormalities. The left ventricular internal cavity size was normal in size. There is  no left ventricular hypertrophy. Left ventricular diastolic parameters are indeterminate. Right Ventricle: The right ventricular size is normal. No increase in right ventricular wall thickness. Right ventricular systolic function is normal. Tricuspid regurgitation signal is inadequate for assessing PA pressure. Left Atrium: Left atrial size was normal in size. Right Atrium: Right atrial size was normal in size. Pericardium: There is no evidence of pericardial effusion. Mitral Valve: The mitral valve is normal in structure. Mild mitral valve regurgitation. No evidence of mitral valve stenosis. Tricuspid Valve: The tricuspid valve is normal in structure. Tricuspid valve regurgitation is mild . No  evidence of tricuspid stenosis. Aortic Valve: The aortic valve was not well visualized. Aortic valve regurgitation is not visualized. Mild to moderate aortic valve sclerosis/calcification is present, unable to exclude mild stenosis, gradient was not measured.  Pulmonic Valve: The pulmonic valve was normal in structure. Pulmonic valve regurgitation is not visualized. No evidence of pulmonic stenosis. Aorta: The aortic root is normal in size and structure. Venous: The pulmonary veins were not well visualized. The inferior vena cava was not well visualized. The inferior vena cava is normal in size with greater than 50% respiratory variability, suggesting right atrial pressure of 3 mmHg. IAS/Shunts: No atrial level shunt detected by color flow Doppler.  LEFT VENTRICLE PLAX 2D LVIDd:         4.31 cm  Diastology LVIDs:         3.07 cm  LV e' medial:    12.10 cm/s LV PW:         1.33 cm  LV E/e' medial:  6.2 LV IVS:        1.10 cm  LV e' lateral:   15.00 cm/s LVOT diam:     1.80 cm  LV E/e' lateral: 5.0 LV SV:         29 LV SV Index:   15 LVOT Area:     2.54 cm  RIGHT VENTRICLE RV Basal diam:  3.64 cm RV S prime:     19.70 cm/s TAPSE (M-mode): 1.9 cm LEFT ATRIUM             Index       RIGHT ATRIUM           Index LA diam:        3.00 cm 1.58 cm/m  RA Area:     15.20 cm LA Vol (A2C):   29.5 ml 15.53 ml/m RA Volume:   41.20 ml  21.70 ml/m LA Vol (A4C):   26.9 ml 14.17 ml/m LA Biplane Vol: 29.9 ml 15.75 ml/m  AORTIC VALVE             PULMONIC VALVE LVOT Vmax:   65.90 cm/s  PV Vmax:       1.11 m/s LVOT Vmean:  45.100 cm/s PV Peak grad:  4.9 mmHg LVOT VTI:    0.114 m  AORTA Ao Root diam: 3.20 cm Ao Asc diam:  3.40 cm MITRAL VALVE               TRICUSPID VALVE MV Area (PHT): 4.86 cm    TV Peak grad:   15.5 mmHg MV Decel Time: 156 msec    TV Vmax:        1.97 m/s MV E velocity: 75.40 cm/s                            SHUNTS                            Systemic VTI:  0.11 m                            Systemic Diam: 1.80 cm  Ida Rogue MD Electronically signed by Ida Rogue MD Signature Date/Time: 12/25/2020/12:36:16 PM    Final      TTE -  1. Left ventricular ejection fraction, by estimation, is 60 to 65%. The left ventricle has normal function. The left ventricle has no regional wall motion abnormalities. Left ventricular diastolic parameters are indeterminate.  2. Right ventricular systolic function is normal. The right ventricular size is normal. Tricuspid regurgitation signal is inadequate for assessing PA pressure.  3. The mitral valve is normal in structure. Mild mitral valve regurgitation.  4. The aortic valve was not well visualized. Aortic valve regurgitation is not visualized. Mild to moderate aortic valve sclerosis/calcification is present, unable to exclude mild stenosis, gradient was not measured    Subjective: Pt reports feeling well, much better.  Energy slowly improving.  SOB improved and feels at his baseline.  Eager to get home.   Discharge Exam: Vitals:   12/29/20 0246 12/29/20 0852  BP: (!) 158/74 (!) 143/69  Pulse: (!) 54 72  Resp: 20 18  Temp: 98.5 F (36.9 C) 98.7 F (37.1 C)  SpO2: 92% 94%   Vitals:   12/29/20 0100 12/29/20 0200 12/29/20 0246 12/29/20 0852  BP: (!) 104/45 135/60 (!) 158/74 (!) 143/69  Pulse: 70  (!) 54 72  Resp: _0 Temp:  Marland Kitchen)  97.4 F (36.3 C) 98.5 F (36.9 C) 98.7 F (37.1 C)  TempSrc:  Axillary Oral Oral  SpO2: 95%  92% 94%  Weight:      Height:        General: Pt is alert, awake, not in acute distress Cardiovascular: RRR, S1/S2 +, no rubs, no gallops Respiratory: CTA bilaterally, on 4 L/min [place] O2, no wheezing, no rhonchi Abdominal: Soft, NT, ND, bowel sounds + Extremities: no edema, no cyanosis    The results of significant diagnostics from this hospitalization (including imaging, microbiology, ancillary and laboratory) are listed below for reference.     Microbiology: Recent Results (from the past 240 hour(s))  Resp Panel by  RT-PCR (Flu A&B, Covid) Nasopharyngeal Swab     Status: Abnormal   Collection Time: 12/23/20  1:54 PM   Specimen: Nasopharyngeal Swab; Nasopharyngeal(NP) swabs in vial transport medium  Result Value Ref Range Status   SARS Coronavirus 2 by RT PCR POSITIVE (A) NEGATIVE Final    Comment: RESULT CALLED TO, READ BACK BY AND VERIFIED WITH: MELISSA SCOTT 12/23/20 1522 KLW (NOTE) SARS-CoV-2 target nucleic acids are DETECTED.  The SARS-CoV-2 RNA is generally detectable in upper respiratory specimens during the acute phase of infection. Positive results are indicative of the presence of the identified virus, but do not rule out bacterial infection or co-infection with other pathogens not detected by the test. Clinical correlation with patient history and other diagnostic information is necessary to determine patient infection status. The expected result is Negative.  Fact Sheet for Patients: EntrepreneurPulse.com.au  Fact Sheet for Healthcare Providers: IncredibleEmployment.be  This test is not yet approved or cleared by the [place] FDA and  has been authorized for detection and/or diagnosis of SARS-CoV-2 by FDA under an Emergency Use Authorization (EUA).  This EUA will remain in effect (meaning this test can be [place] ed) for the duration of  the COVID-19 declaration under Section 564(b)(1) of the Act, 21 U.S.C. section 360bbb-3(b)(1), unless the authorization is terminated or revoked sooner.     Influenza A by PCR NEGATIVE NEGATIVE Final   Influenza B by PCR NEGATIVE NEGATIVE Final    Comment: (NOTE) The Xpert Xpress SARS-CoV-2/FLU/RSV plus assay is intended as an aid in the diagnosis of influenza from Nasopharyngeal swab specimens and should not be used as a sole basis for treatment. Nasal washings and aspirates are unacceptable for Xpert Xpress SARS-CoV-2/FLU/RSV testing.  Fact Sheet for  Patients: EntrepreneurPulse.com.au  Fact Sheet for Healthcare Providers: IncredibleEmployment.be  This test is not yet approved or cleared by the [place] FDA and has been authorized for detection and/or diagnosis of SARS-CoV-2 by FDA under an Emergency Use Authorization (EUA). This EUA will remain in effect (meaning this test can be used) for the duration of the COVID-19 declaration under Section 564(b)(1) of the Act, 21 U.S.C. section 360bbb-3(b)(1), unless the authorization is terminated or revoked.  Performed at [facility], [address] [place],  [address]   MRSA Next Gen by PCR, Nasal     Status: None   Collection Time: 12/23/20  8:25 PM   Specimen: Nasal Mucosa; Nasal Swab  Result Value Ref Range Status   MRSA by PCR Next Gen NOT DETECTED NOT DETECTED Final    Comment: (NOTE) The GeneXpert MRSA Assay (FDA approved for NASAL specimens only), is one component of a comprehensive MRSA colonization surveillance program. It is not intended to diagnose MRSA infection nor to guide or monitor treatment for MRSA infections. Test performance is not  FDA approved in patients less than 79 years old. Performed at [facility] [place], [place], [place] [address]   Expectorated Sputum Assessment w Gram Stain, Rflx to Resp Cult     Status: None   Collection Time: 12/25/20  9:17 PM   Specimen: Sputum  Result Value Ref Range Status   Specimen Description SPUTUM  Final   Special Requests NONE  Final   Sputum evaluation   Final    Sputum specimen not acceptable for testing.  Please recollect.   RESULT CALLED TO, READ BACK BY AND VERIFIED WITH: RUSSELL CADY AT 4656 ON 12/26/20 BY SS Performed at [facility], [address] [place], [place] [address]    Report Status 12/26/2020 FINAL  Final     Labs: BNP (last 3 results) Recent Labs    12/27/20 0412 12/28/20 0557 12/29/20 0541  BNP 417.5*  175.8* 170.0*   Basic Metabolic Panel: Recent Labs  Lab 12/24/20 0555 12/25/20 0624 12/26/20 0658 12/27/20 0412 12/28/20 0557 12/29/20 0541  NA 136 135 139 137 138 137  K 5.0 4.3 3.9 4.3 4.4 4.5  CL 101 101 99 103 99 99  CO2 _0 GLUCOSE 97 169* 123* 144* 108* 100*  BUN 38* 39* 53* 65* 56* 52*  CREATININE 1.70* 1.57* 1.79* 1.81* 1.77* 1.75*  CALCIUM 8.5* 8.1* 8.4* 7.8* 8.4* 8.4*  MG 2.1 2.1 2.1 2.3  --   --   PHOS 4.2 3.7 3.6 4.6  --   --    Liver Function Tests: Recent Labs  Lab 12/25/20 0624 12/26/20 0658 12/27/20 0412 12/28/20 0557 12/29/20 0541  AST _1 69* 73*  ALT 22 35 32 109* 144*  ALKPHOS 28* 34* 34* 37* 39  BILITOT 0.7 0.8 0.7 0.6 0.8  PROT 5.7* 6.3* 5.8* 6.0* 5.9*  ALBUMIN 2.7* 3.0* 2.8* 3.0* 3.1*   No results for input(s): LIPASE, AMYLASE in the last 168 hours. No results for input(s): AMMONIA in the last 168 hours. CBC: Recent Labs  Lab 12/25/20 0624 12/26/20 0658 12/27/20 0412 12/28/20 0557 12/29/20 0541  WBC 6.8 10.6* 10.5 9.8 12.0*  NEUTROABS 6.3 9.8* 9.4* 9.0* 10.7*  HGB 12.6* 14.2 13.2 13.7 14.0  HCT 37.3* 40.8 38.4* 39.2 41.1  MCV 100.8* 102.0* 101.1* 101.8* 103.5*  PLT 174 215 185 183 175   Cardiac Enzymes: No results for input(s): CKTOTAL, CKMB, CKMBINDEX, TROPONINI in the last 168 hours. BNP: Invalid input(s): POCBNP CBG: Recent Labs  Lab 12/28/20 0807 12/28/20 1217 12/28/20 1638 12/28/20 2108 12/29/20 0837  GLUCAP 111* 252* 230* 119* 161*   D-Dimer Recent Labs    12/27/20 0412  DDIMER 0.51*   Hgb A1c No results for input(s): HGBA1C in the last 72 hours. Lipid Profile No results for input(s): CHOL, HDL, LDLCALC, TRIG, CHOLHDL, LDLDIRECT in the last 72 hours. Thyroid function studies Recent Labs    12/27/20 0412  TSH 0.462   Anemia work up Recent Labs    12/27/20 0412  FERRITIN 118   Urinalysis    Component Value Date/Time   COLORURINE STRAW (A) 06/21/2020 0435   APPEARANCEUR CLEAR  (A) 06/21/2020 0435   LABSPEC 1.012 06/21/2020 0435   PHURINE 5.0 06/21/2020 [place] 06/21/2020 0435   HGBUR SMALL (A) 06/21/2020 [name] 06/21/2020 [place] 06/21/2020 [place] 06/21/2020 0435   NITRITE NEGATIVE 06/21/2020 0435   LEUKOCYTESUR NEGATIVE 06/21/2020 0435  Sepsis Labs Invalid input(s): PROCALCITONIN,  WBC,  LACTICIDVEN Microbiology Recent Results (from the past 240 hour(s))  Resp Panel by RT-PCR (Flu A&B, Covid) Nasopharyngeal Swab     Status: Abnormal   Collection Time: 12/23/20  1:54 PM   Specimen: Nasopharyngeal Swab; Nasopharyngeal(NP) swabs in vial transport medium  Result Value Ref Range Status   SARS Coronavirus 2 by RT PCR POSITIVE (A) NEGATIVE Final    Comment: RESULT CALLED TO, READ BACK BY AND VERIFIED WITH: MELISSA SCOTT 12/23/20 1522 KLW (NOTE) SARS-CoV-2 target nucleic acids are DETECTED.  The SARS-CoV-2 RNA is generally detectable in upper respiratory specimens during the acute phase of infection. Positive results are indicative of the presence of the identified virus, but do not rule out bacterial infection or co-infection with other pathogens not detected by the test. Clinical correlation with patient history and other diagnostic information is necessary to determine patient infection status. The expected result is Negative.  Fact Sheet for Patients: EntrepreneurPulse.com.au  Fact Sheet for Healthcare Providers: IncredibleEmployment.be  This test is not yet approved or cleared by the [place] FDA and  has been authorized for detection and/or diagnosis of SARS-CoV-2 by FDA under an Emergency Use Authorization (EUA).  This EUA will remain in effect (meaning this test can be [place] ed) for the duration of  the COVID-19 declaration under Section 564(b)(1) of the Act, 21 U.S.C. section 360bbb-3(b)(1), unless the authorization is terminated or  revoked sooner.     Influenza A by PCR NEGATIVE NEGATIVE Final   Influenza B by PCR NEGATIVE NEGATIVE Final    Comment: (NOTE) The Xpert Xpress SARS-CoV-2/FLU/RSV plus assay is intended as an aid in the diagnosis of influenza from Nasopharyngeal swab specimens and should not be used as a sole basis for treatment. Nasal washings and aspirates are unacceptable for Xpert Xpress SARS-CoV-2/FLU/RSV testing.  Fact Sheet for Patients: EntrepreneurPulse.com.au  Fact Sheet for Healthcare Providers: IncredibleEmployment.be  This test is not yet approved or cleared by the [place] FDA and has been authorized for detection and/or diagnosis of SARS-CoV-2 by FDA under an Emergency Use Authorization (EUA). This EUA will remain in effect (meaning this test can be used) for the duration of the COVID-19 declaration under Section 564(b)(1) of the Act, 21 U.S.C. section 360bbb-3(b)(1), unless the authorization is terminated or revoked.  Performed at [facility], [address] [place], [place] [address]   MRSA Next Gen by PCR, Nasal     Status: None   Collection Time: 12/23/20  8:25 PM   Specimen: Nasal Mucosa; Nasal Swab  Result Value Ref Range Status   MRSA by PCR Next Gen NOT DETECTED NOT DETECTED Final    Comment: (NOTE) The GeneXpert MRSA Assay (FDA approved for NASAL specimens only), is one component of a comprehensive MRSA colonization surveillance program. It is not intended to diagnose MRSA infection nor to guide or monitor treatment for MRSA infections. Test performance is not FDA approved in patients less than 50 years old. Performed at [facility], [place], [place] [address]   Expectorated Sputum Assessment w Gram Stain, Rflx to Resp Cult     Status: None   Collection Time: 12/25/20  9:17 PM   Specimen: Sputum  Result Value Ref Range Status   Specimen Description SPUTUM  Final   Special Requests  NONE  Final   Sputum evaluation   Final    Sputum specimen not acceptable for testing.  Please recollect.   RESULT CALLED TO, READ BACK  BY AND VERIFIED WITH: RUSSELL CADY AT 1696 ON 12/26/20 BY SS Performed at [facility], [address] [place], [place] [address]    Report Status 12/26/2020 FINAL  Final     Time coordinating discharge: Over 30 minutes  SIGNED:   Ezekiel Slocumb, DO [facility] 12/29/2020, 12:36 PM   If 7PM-7AM, please contact night-coverage [email or web address]

## 2020-12-30 LAB — GLUCOSE, CAPILLARY: Glucose-Capillary: 118 mg/dL — ABNORMAL HIGH (ref 70–99)

## 2020-12-31 DIAGNOSIS — Z8616 Personal history of COVID-19: Secondary | ICD-10-CM | POA: Insufficient documentation

## 2021-01-20 ENCOUNTER — Other Ambulatory Visit (HOSPITAL_COMMUNITY): Payer: Self-pay | Admitting: Nephrology

## 2021-01-20 ENCOUNTER — Other Ambulatory Visit: Payer: Self-pay | Admitting: Nephrology

## 2021-01-20 DIAGNOSIS — E1122 Type 2 diabetes mellitus with diabetic chronic kidney disease: Secondary | ICD-10-CM

## 2021-01-20 DIAGNOSIS — R809 Proteinuria, unspecified: Secondary | ICD-10-CM

## 2021-01-20 DIAGNOSIS — J449 Chronic obstructive pulmonary disease, unspecified: Secondary | ICD-10-CM

## 2021-01-20 DIAGNOSIS — N400 Enlarged prostate without lower urinary tract symptoms: Secondary | ICD-10-CM

## 2021-01-20 DIAGNOSIS — N1832 Chronic kidney disease, stage 3b: Secondary | ICD-10-CM

## 2021-01-20 DIAGNOSIS — R8290 Unspecified abnormal findings in urine: Secondary | ICD-10-CM | POA: Insufficient documentation

## 2021-01-20 DIAGNOSIS — I10 Essential (primary) hypertension: Secondary | ICD-10-CM

## 2021-01-20 DIAGNOSIS — D631 Anemia in chronic kidney disease: Secondary | ICD-10-CM

## 2021-01-20 DIAGNOSIS — U071 COVID-19: Secondary | ICD-10-CM

## 2021-02-02 ENCOUNTER — Ambulatory Visit: Payer: Medicare Other | Attending: Nephrology

## 2021-02-16 ENCOUNTER — Other Ambulatory Visit: Payer: Self-pay

## 2021-02-16 ENCOUNTER — Ambulatory Visit
Admission: RE | Admit: 2021-02-16 | Discharge: 2021-02-16 | Disposition: A | Discharge status: Home / Self Care | Payer: BC Managed Care – PPO | Source: Ambulatory Visit | Attending: Nephrology | Admitting: Nephrology

## 2021-02-16 DIAGNOSIS — I10 Essential (primary) hypertension: Secondary | ICD-10-CM | POA: Insufficient documentation

## 2021-02-16 DIAGNOSIS — D631 Anemia in chronic kidney disease: Secondary | ICD-10-CM | POA: Diagnosis present

## 2021-02-16 DIAGNOSIS — E1122 Type 2 diabetes mellitus with diabetic chronic kidney disease: Secondary | ICD-10-CM | POA: Insufficient documentation

## 2021-02-16 DIAGNOSIS — R8290 Unspecified abnormal findings in urine: Secondary | ICD-10-CM | POA: Diagnosis not present

## 2021-02-16 DIAGNOSIS — N1832 Chronic kidney disease, stage 3b: Secondary | ICD-10-CM | POA: Insufficient documentation

## 2021-02-16 DIAGNOSIS — N400 Enlarged prostate without lower urinary tract symptoms: Secondary | ICD-10-CM | POA: Diagnosis present

## 2021-02-16 DIAGNOSIS — J449 Chronic obstructive pulmonary disease, unspecified: Secondary | ICD-10-CM | POA: Insufficient documentation

## 2021-02-16 DIAGNOSIS — R809 Proteinuria, unspecified: Secondary | ICD-10-CM | POA: Diagnosis present

## 2021-02-16 DIAGNOSIS — U071 COVID-19: Secondary | ICD-10-CM | POA: Diagnosis present

## 2021-02-21 ENCOUNTER — Other Ambulatory Visit: Payer: Self-pay | Admitting: Otolaryngology

## 2021-02-21 DIAGNOSIS — R42 Dizziness and giddiness: Secondary | ICD-10-CM

## 2021-03-03 DIAGNOSIS — E875 Hyperkalemia: Secondary | ICD-10-CM | POA: Insufficient documentation

## 2021-03-07 ENCOUNTER — Ambulatory Visit
Admission: RE | Admit: 2021-03-07 | Discharge: 2021-03-07 | Disposition: A | Discharge status: Home / Self Care | Payer: BC Managed Care – PPO | Source: Ambulatory Visit | Attending: Otolaryngology | Admitting: Otolaryngology

## 2021-03-07 ENCOUNTER — Other Ambulatory Visit: Payer: Self-pay

## 2021-03-07 ENCOUNTER — Ambulatory Visit: Payer: BC Managed Care – PPO

## 2021-03-07 DIAGNOSIS — R42 Dizziness and giddiness: Secondary | ICD-10-CM | POA: Diagnosis not present

## 2021-03-10 DIAGNOSIS — R600 Localized edema: Secondary | ICD-10-CM | POA: Insufficient documentation

## 2021-03-10 DIAGNOSIS — R609 Edema, unspecified: Secondary | ICD-10-CM | POA: Insufficient documentation

## 2021-03-10 DIAGNOSIS — R0609 Other forms of dyspnea: Secondary | ICD-10-CM | POA: Insufficient documentation

## 2021-03-11 ENCOUNTER — Encounter: Payer: Self-pay | Admitting: Gastroenterology

## 2021-03-14 ENCOUNTER — Ambulatory Visit
Admission: RE | Admit: 2021-03-14 | Discharge: 2021-03-14 | Disposition: A | Discharge status: Home / Self Care | Payer: BC Managed Care – PPO | Source: Ambulatory Visit | Attending: Gastroenterology | Admitting: Gastroenterology

## 2021-03-14 ENCOUNTER — Ambulatory Visit: Payer: BC Managed Care – PPO | Admitting: Registered Nurse

## 2021-03-14 ENCOUNTER — Encounter
Admission: RE | Disposition: A | Discharge status: Home / Self Care | Payer: Self-pay | Source: Ambulatory Visit | Attending: Gastroenterology

## 2021-03-14 ENCOUNTER — Other Ambulatory Visit: Payer: Self-pay

## 2021-03-14 ENCOUNTER — Encounter: Payer: Self-pay | Admitting: Gastroenterology

## 2021-03-14 DIAGNOSIS — K219 Gastro-esophageal reflux disease without esophagitis: Secondary | ICD-10-CM | POA: Diagnosis not present

## 2021-03-14 DIAGNOSIS — K224 Dyskinesia of esophagus: Secondary | ICD-10-CM | POA: Diagnosis not present

## 2021-03-14 DIAGNOSIS — Z87891 Personal history of nicotine dependence: Secondary | ICD-10-CM | POA: Insufficient documentation

## 2021-03-14 DIAGNOSIS — J449 Chronic obstructive pulmonary disease, unspecified: Secondary | ICD-10-CM | POA: Diagnosis not present

## 2021-03-14 DIAGNOSIS — D509 Iron deficiency anemia, unspecified: Secondary | ICD-10-CM | POA: Insufficient documentation

## 2021-03-14 DIAGNOSIS — I10 Essential (primary) hypertension: Secondary | ICD-10-CM | POA: Diagnosis not present

## 2021-03-14 DIAGNOSIS — K921 Melena: Secondary | ICD-10-CM | POA: Diagnosis not present

## 2021-03-14 HISTORY — DX: Personal history of other specified conditions: Z87.898

## 2021-03-14 HISTORY — DX: Malignant (primary) neoplasm, unspecified: C80.1

## 2021-03-14 HISTORY — DX: COVID-19: U07.1

## 2021-03-14 HISTORY — DX: Irritable bowel syndrome, unspecified: K58.9

## 2021-03-14 HISTORY — DX: Benign prostatic hyperplasia without lower urinary tract symptoms: N40.0

## 2021-03-14 HISTORY — PX: ESOPHAGOGASTRODUODENOSCOPY: SHX5428

## 2021-03-14 HISTORY — DX: Spinal stenosis, site unspecified: M48.00

## 2021-03-14 SURGERY — EGD (ESOPHAGOGASTRODUODENOSCOPY)
Anesthesia: General

## 2021-03-14 MED ORDER — LIDOCAINE HCL (CARDIAC) PF 100 MG/5ML IV SOSY
PREFILLED_SYRINGE | INTRAVENOUS | Status: DC | PRN
  Administered 2021-03-14: 60 mg via INTRAVENOUS

## 2021-03-14 MED ORDER — PROPOFOL 10 MG/ML IV BOLUS
INTRAVENOUS | Status: DC | PRN
  Administered 2021-03-14: 60 mg via INTRAVENOUS

## 2021-03-14 MED ORDER — SODIUM CHLORIDE 0.9 % IV SOLN: INTRAVENOUS | Status: DC

## 2021-03-14 MED ORDER — PROPOFOL 500 MG/50ML IV EMUL
INTRAVENOUS | Status: DC | PRN
  Administered 2021-03-14: 100 ug/kg/min via INTRAVENOUS

## 2021-03-14 NOTE — Interval H&P Note (Signed)
History and Physical Interval Note: Preprocedure H&P from 03/14/21  was reviewed and there was no interval change after seeing and examining the patient.  Written consent was obtained from the patient after discussion of risks, benefits, and alternatives. Patient has consented to proceed with Esophagogastroduodenoscopy with possible intervention   03/14/2021 1:12 PM  Riley Martin  has presented today for surgery, with the diagnosis of Iron deficiency anemia, unspecified iron deficiency anemia type (D50.9) History of melena (Z87.19) Occult blood positive stool (R19.5).  The various methods of treatment have been discussed with the patient and family. After consideration of risks, benefits and other options for treatment, the patient has consented to  Procedure(s): ESOPHAGOGASTRODUODENOSCOPY (EGD) (N/A) as a surgical intervention.  The patient's history has been reviewed, patient examined, no change in status, stable for surgery.  I have reviewed the patient's chart and labs.  Questions were answered to the patient's satisfaction.     [name]

## 2021-03-14 NOTE — Anesthesia Postprocedure Evaluation (Signed)
Anesthesia Post Note  Patient: Riley Martin  Procedure(s) Performed: ESOPHAGOGASTRODUODENOSCOPY (EGD)  Patient location during evaluation: PACU Anesthesia Type: General Level of consciousness: awake and awake and alert Pain management: pain level controlled Vital Signs Assessment: post-procedure vital signs reviewed and stable Respiratory status: spontaneous breathing and respiratory function stable Cardiovascular status: blood pressure returned to baseline Anesthetic complications: no   No notable events documented.   Last Vitals:  Vitals:   03/14/21 1252 03/14/21 1331  BP: 138/79 115/65  Pulse: 91 71  Resp: (!) 21 12  Temp: (!) 35.7 C (!) 35.7 C  SpO2: 100% 100%    Last Pain:  Vitals:   03/14/21 1331  TempSrc: Temporal  PainSc: Asleep                 VAN STAVEREN,Marcey Persad

## 2021-03-14 NOTE — H&P (Signed)
[facility] Pre-Procedure H&P   Patient ID: Riley Martin is a 75 y.o. male.  Gastroenterology Provider: [name], DO  Referring Provider: Dawson Bills, NP PCP: [name], MD  Date: 03/14/2021  HPI Mr. Riley Martin is a 75 y.o. male who presents today for Esophagogastroduodenoscopy for melena and iron deficiency anemia.  Reports breathing is stable today. Has not noted any further dark/tarry stools since stopping xarelto. Hgb from 10/17 was 9.7. Was 13.3 earlier this year. BUN 34 Cr 1.8  No other acute gi complaints.  Past Medical History:  Diagnosis Date   Arthritis    BPH (benign prostatic hyperplasia)    Cancer (HCC)    skin cancer   Chronic pain    COPD (chronic obstructive pulmonary disease) (HCC)    COVID    GERD (gastroesophageal reflux disease)    Headache    History of insomnia    IBS (irritable bowel syndrome)    Orthostatic dizziness    PONV (postoperative nausea and vomiting)    Spinal stenosis    Torn rotator cuff    left    Past Surgical History:  Procedure Laterality Date   COLONOSCOPY     EXCISION CHONCHA BULLOSA Bilateral 06/02/2015   Procedure: BILATERAL CHONCHA BULLOSA;  Surgeon: Carloyn Manner, MD;  Location: [place];  Service: ENT;  Laterality: Bilateral;   HERNIA REPAIR     MAXILLARY ANTROSTOMY Bilateral 06/02/2015   Procedure: MAXILLARY ANTROSTOMY;  Surgeon: Carloyn Manner, MD;  Location: [place];  Service: ENT;  Laterality: Bilateral;   NASAL POLYP SURGERY     NECK SURGERY  05/08/2006   no limitations   UPPER GASTROINTESTINAL ENDOSCOPY      Family History No h/o GI disease or malignancy  Review of Systems  Constitutional:  Positive for fatigue and unexpected weight change. Negative for activity change, appetite change, chills and fever.  HENT:  Negative for trouble swallowing and voice change.   Respiratory:  Positive for shortness of breath.   Cardiovascular:   Negative for chest pain and palpitations.  Gastrointestinal:  Positive for blood in stool (melena). Negative for abdominal distention, abdominal pain, anal bleeding, constipation, diarrhea, nausea and vomiting.  Musculoskeletal:  Negative for arthralgias and myalgias.  Skin:  Negative for color change and pallor.  Neurological:  Positive for weakness. Negative for dizziness and syncope.  Psychiatric/Behavioral:  Negative for confusion. The patient is not nervous/anxious.   All other systems reviewed and are negative.   Medications No current facility-administered medications on file prior to encounter.   Current Outpatient Medications on File Prior to Encounter  Medication Sig Dispense Refill   albuterol (PROVENTIL) (2.5 MG/3ML) 0.083% nebulizer solution Take 3 mLs by nebulization every 6 (six) hours as needed for wheezing or shortness of breath.     amiodarone (PACERONE) 200 MG tablet Take 1 tablet (200 mg total) by mouth 2 (two) times daily. 60 tablet 1   budesonide (PULMICORT) 1 MG/2ML nebulizer solution Take 1 mg by nebulization daily.     fluticasone (FLONASE) 50 MCG/ACT nasal spray SPRAY 2 SPRAYS INTO EACH NOSTRIL EVERY DAY 16 g 0   hyoscyamine (LEVSIN SL) 0.125 MG SL tablet Place 0.125 mg under the tongue every 4 (four) hours as needed.     ipratropium (ATROVENT) 0.02 % nebulizer solution Take 0.5 mg by nebulization 2 (two) times daily.     levalbuterol (XOPENEX HFA) 45 MCG/ACT inhaler Inhale 2 puffs into the lungs every 4 (four) hours as needed for  wheezing.     megestrol (MEGACE ES) 625 MG/5ML suspension Take 625 mg by mouth daily.     metoprolol tartrate (LOPRESSOR) 50 MG tablet Take 1 tablet (50 mg total) by mouth 2 (two) times daily. 60 tablet 1   mirtazapine (REMERON) 15 MG tablet Take 15 mg by mouth at bedtime.     mometasone-formoterol (DULERA) 200-5 MCG/ACT AERO Inhale 2 puffs into the lungs 2 (two) times daily. 1 each 1   Multiple Vitamin (MULTIVITAMIN WITH MINERALS) TABS  tablet Take 1 tablet by mouth daily.     Nutritional Supplements (FEEDING SUPPLEMENT, NEPRO CARB STEADY,) LIQD Take 237 mLs by mouth 3 (three) times daily between meals.  0   ondansetron (ZOFRAN-ODT) 4 MG disintegrating tablet Take 4 mg by mouth every 8 (eight) hours as needed for nausea or vomiting.     oxyCODONE-acetaminophen (PERCOCET) 10-325 MG tablet Take 1 tablet by mouth 5 (five) times daily as needed.     pantoprazole (PROTONIX) 20 MG tablet Take 20 mg by mouth daily.     tamsulosin (FLOMAX) 0.4 MG CAPS capsule Take 0.4 mg by mouth daily.     tiotropium (SPIRIVA) 18 MCG inhalation capsule Place 1 capsule (18 mcg total) into inhaler and inhale daily. 30 capsule 1   VENTOLIN HFA 108 (90 Base) MCG/ACT inhaler Inhale 2 puffs into the lungs every 6 (six) hours as needed.     blood glucose meter kit and supplies KIT Dispense based on patient and insurance preference. Use up to four times daily as directed. 1 each 0   insulin lispro (HUMALOG KWIKPEN) 100 UNIT/ML KwikPen Inject 0-9 Units into the skin 3 (three) times daily with meals. Per Sliding Scale Instructions provided 15 mL 0   Insulin Pen Needle (PEN NEEDLES) 31G X 6 MM MISC 1 each by Does not apply route 3 (three) times daily with meals. 100 each 0   OXYCONTIN 10 MG 12 hr tablet Take 10 mg by mouth 3 (three) times daily.     Rivaroxaban (XARELTO) 15 MG TABS tablet Take 1 tablet (15 mg total) by mouth daily with supper. (Patient not taking: Reported on 03/14/2021) 42 tablet 1   sodium chloride (OCEAN) 0.65 % SOLN nasal spray Place 1 spray into both nostrils as needed for congestion. 30 mL 1    Pertinent medications related to GI and procedure were reviewed by me with the patient prior to the procedure   Current Facility-Administered Medications:    0.9 %  sodium chloride infusion, , Intravenous, Continuous, [name], DO, Last Rate: 20 mL/hr at 03/14/21 1256, New Bag at 03/14/21 1256  sodium chloride 20 mL/hr at 03/14/21 1256        Allergies  Allergen Reactions   Iodinated Diagnostic Agents Other (See Comments) and Nausea Only    Dry heaves, patient states he felt like he was on fire and about to pass out.  Pre-syncope, burning  Dry heaves, patient states he felt like he was on fire and about to pass out.    Lisinopril Swelling    Tongue swelling, neck pain and Headaches    Tramadol Other (See Comments) and Shortness Of Breath    Other reaction(s): Other (See Comments) Other Reaction: tachy Dizziness and unsteady gait.   Pregabalin Palpitations    Other reaction(s): Other (See Comments) Other Reaction: edema   Allergies were reviewed by me prior to the procedure  Objective    Vitals:   03/14/21 1252  BP: 138/79  Pulse: 91  Resp: (!) 21  Temp: (!) 96.3 F (35.7 C)  TempSrc: Temporal  SpO2: 100%  Weight: 68.9 kg  Height: 6' (1.829 m)     Physical Exam Vitals and nursing note reviewed.  Constitutional:      General: He is not in acute distress.    Appearance: Normal appearance. He is ill-appearing (chronically). He is not toxic-appearing or diaphoretic.  HENT:     Head: Normocephalic and atraumatic.     Nose: Nose normal.     Mouth/Throat:     Mouth: Mucous membranes are moist.     Pharynx: Oropharynx is clear.  Eyes:     General: No scleral icterus.    Extraocular Movements: Extraocular movements intact.  Cardiovascular:     Rate and Rhythm: Normal rate and regular rhythm.     Heart sounds: Normal heart sounds. No murmur heard.   No friction rub. No gallop.  Pulmonary:     Effort: No respiratory distress.     Breath sounds: No wheezing, rhonchi or rales.     Comments: Diminished breath sounds bilaterally. Home O2 dependent - 5L [place] Abdominal:     General: Bowel sounds are normal. There is no distension.     Palpations: Abdomen is soft.     Tenderness: There is no abdominal tenderness. There is no guarding or rebound.  Musculoskeletal:     Cervical back: Neck supple.      Right lower leg: No edema.     Left lower leg: No edema.  Skin:    General: Skin is warm and dry.     Coloration: Skin is not jaundiced or pale.  Neurological:     General: No focal deficit present.     Mental Status: He is alert and oriented to person, place, and time. Mental status is at baseline.  Psychiatric:        Mood and Affect: Mood normal.        Behavior: Behavior normal.        Thought Content: Thought content normal.        Judgment: Judgment normal.     Assessment:  Mr. Olman Yono Seymore is a 75 y.o. male  who presents today for Esophagogastroduodenoscopy for melena and iron deficiency anemia.  Plan:  Esophagogastroduodenoscopy with possible intervention today  Esophagogastroduodenoscopy with possible biopsy, control of bleeding, polypectomy, and interventions as necessary has been discussed with the patient/patient representative. Informed consent was obtained from the patient/patient representative after explaining the indication, nature, and risks of the procedure including but not limited to death, bleeding, perforation, missed neoplasm/lesions, cardiorespiratory compromise, and reaction to medications. Opportunity for questions was given and appropriate answers were provided. Patient/patient representative has verbalized understanding is amenable to undergoing the procedure.   [name], DO  [facility]  Portions of the record may have been created with voice recognition software. Occasional wrong-word or 'sound-a-like' substitutions may have occurred due to the inherent limitations of voice recognition software.  Read the chart carefully and recognize, using context, where substitutions may have occurred.

## 2021-03-14 NOTE — Anesthesia Preprocedure Evaluation (Signed)
Anesthesia Evaluation  Patient identified by MRN, date of birth, ID band Patient awake    History of Anesthesia Complications (+) PONV  Airway Mallampati: II       Dental  (+) Teeth Intact   Pulmonary COPD,  COPD inhaler, former smoker,     + decreased breath sounds      Cardiovascular Exercise Tolerance: Good hypertension, Pt. on medications  Rhythm:Regular Rate:Normal     Neuro/Psych  Headaches, negative psych ROS   GI/Hepatic Neg liver ROS, GERD  ,  Endo/Other  negative endocrine ROS  Renal/GU negative Renal ROS  negative genitourinary   Musculoskeletal  (+) Arthritis ,   Abdominal Normal abdominal exam  (+)   Peds negative pediatric ROS (+)  Hematology negative hematology ROS (+)   Anesthesia Other Findings   Reproductive/Obstetrics                             Anesthesia Physical Anesthesia Plan  ASA: 3  Anesthesia Plan: General   Post-op Pain Management:    Induction: Intravenous  PONV Risk Score and Plan:   Airway Management Planned: Nasal Cannula  Additional Equipment:   Intra-op Plan:   Post-operative Plan:   Informed Consent: I have reviewed the patients History and Physical, chart, labs and discussed the procedure including the risks, benefits and alternatives for the proposed anesthesia with the patient or authorized representative who has indicated his/her understanding and acceptance.       Plan Discussed with: CRNA and Surgeon  Anesthesia Plan Comments:         Anesthesia Quick Evaluation

## 2021-03-14 NOTE — Transfer of Care (Signed)
Immediate Anesthesia Transfer of Care Note  Patient: Riley Martin  Procedure(s) Performed: ESOPHAGOGASTRODUODENOSCOPY (EGD)  Patient Location: PACU  Anesthesia Type:General  Level of Consciousness: sedated  Airway & Oxygen Therapy: Patient Spontanous Breathing and Patient connected to face mask oxygen  Post-op Assessment: Report given to RN and Post -op Vital signs reviewed and stable  Post vital signs: Reviewed and stable  Last Vitals:  Vitals Value Taken Time  BP    Temp    Pulse 71 03/14/21 1331  Resp 12 03/14/21 1331  SpO2 100 % 03/14/21 1331  Vitals shown include unvalidated device data.  Last Pain:  Vitals:   03/14/21 1252  TempSrc: Temporal  PainSc: 0-No pain         Complications: No notable events documented.

## 2021-03-14 NOTE — Op Note (Signed)
[facility] Patient Name: Riley Martin Procedure Date: 03/14/2021 12:59 PM MRN: [id number] Account #: [id number] Date of Birth: 31-Dec-1945 Admit Type: Outpatient Age: 75 Room: [facility] ENDO ROOM 2 Gender: Male Note Status: Finalized Instrument Name: Upper Endoscope [id number] Procedure:             Upper GI endoscopy Indications:           Iron deficiency anemia, Melena Providers:             Rueben Bash, DO Referring MD:          [name] (Referring MD) Medicines:             Monitored Anesthesia Care Complications:         No immediate complications. Estimated blood loss:                         Minimal. Procedure:             Pre-Anesthesia Assessment:                        - Prior to the procedure, a History and Physical was                         performed, and patient medications and allergies were                         reviewed. The patient is competent. The risks and                         benefits of the procedure and the sedation options and                         risks were discussed with the patient. All questions                         were answered and informed consent was obtained.                         Patient identification and proposed procedure were                         verified by the physician, the nurse, the anesthetist                         and the technician in the endoscopy suite. Mental                         Status Examination: alert and oriented. Airway                         Examination: normal oropharyngeal airway and neck                         mobility. Respiratory Examination: poor air movement.                         CV Examination: RRR, no murmurs, no S3 or S4.  Prophylactic Antibiotics: The patient does not require                         prophylactic antibiotics. Prior Anticoagulants: The                         patient has taken no previous anticoagulant or                          antiplatelet agents. ASA Grade Assessment: IV - A                         patient with severe systemic disease that is a                         constant threat to life. After reviewing the risks and                         benefits, the patient was deemed in satisfactory                         condition to undergo the procedure. The anesthesia                         plan was to use monitored anesthesia care (MAC).                         Immediately prior to administration of medications,                         the patient was re-assessed for adequacy to receive                         sedatives. The heart rate, respiratory rate, oxygen                         saturations, blood pressure, adequacy of pulmonary                         ventilation, and response to care were monitored                         throughout the procedure. The physical status of the                         patient was re-assessed after the procedure.                        After obtaining informed consent, the endoscope was                         passed under direct vision. Throughout the procedure,                         the patient's blood pressure, pulse, and oxygen                         saturations were monitored continuously. The Endoscope  was introduced through the mouth, and advanced to the                         second part of duodenum. The upper GI endoscopy was                         accomplished without difficulty. The patient tolerated                         the procedure well. Findings:      The duodenal bulb, first portion of the duodenum and second portion of       the duodenum were normal. Biopsies for histology were taken with a cold       forceps for evaluation of celiac disease. Estimated blood loss was       minimal.      The entire examined stomach was normal. Biopsies were taken with a cold       forceps for Helicobacter pylori  testing. Estimated blood loss was       minimal.      The Z-line was regular and was found 40 cm from the incisors.      Esophagogastric landmarks were identified: the gastroesophageal junction       was found at 40 cm from the incisors.      Abnormal motility was noted in the esophagus. The cricopharyngeus was       normal. There is spasticity of the esophageal body. The distal       esophagus/lower esophageal sphincter is open. Tertiary peristaltic waves       are noted.      The exam of the esophagus was otherwise normal. Impression:            - Normal duodenal bulb, first portion of the duodenum                         and second portion of the duodenum. Biopsied.                        - Normal stomach. Biopsied.                        - Z-line regular, 40 cm from the incisors.                        - Esophagogastric landmarks identified.                        - Abnormal esophageal motility, consistent with                         presbyesophagus. Recommendation:        - Discharge patient to home.                        - Resume previous diet.                        - Continue present medications.                        - Await pathology results.                        -  Return to referring physician as previously                         scheduled. Procedure Code(s):     --- Professional ---                        [phone], Esophagogastroduodenoscopy, flexible,                         transoral; with biopsy, single or multiple Diagnosis Code(s):     --- Professional ---                        K22.4, Dyskinesia of esophagus                        D50.9, Iron deficiency anemia, unspecified                        K92.1, Melena (includes Hematochezia) CPT copyright 2019 American Medical Association. All rights reserved. The codes documented in this report are preliminary and upon coder review may  be revised to meet current compliance requirements. Attending Participation:      I  personally performed the entire procedure. Volney American, DO [name] DO, DO 03/14/2021 1:31:50 PM This report has been signed electronically. Number of Addenda: 0 Note Initiated On: 03/14/2021 12:59 PM Estimated Blood Loss:  Estimated blood loss was minimal.      [facility]

## 2021-03-15 ENCOUNTER — Encounter: Payer: Self-pay | Admitting: Gastroenterology

## 2021-03-15 LAB — SURGICAL PATHOLOGY

## 2021-03-29 DIAGNOSIS — R195 Other fecal abnormalities: Secondary | ICD-10-CM | POA: Insufficient documentation

## 2021-03-29 DIAGNOSIS — Z8719 Personal history of other diseases of the digestive system: Secondary | ICD-10-CM | POA: Insufficient documentation

## 2021-03-29 DIAGNOSIS — D509 Iron deficiency anemia, unspecified: Secondary | ICD-10-CM | POA: Insufficient documentation

## 2021-04-01 ENCOUNTER — Ambulatory Visit
Admission: RE | Admit: 2021-04-01 | Discharge: 2021-04-01 | Disposition: A | Discharge status: Home / Self Care | Payer: BC Managed Care – PPO | Source: Ambulatory Visit | Attending: Otolaryngology | Admitting: Otolaryngology

## 2021-04-01 ENCOUNTER — Other Ambulatory Visit: Payer: Self-pay

## 2021-04-01 DIAGNOSIS — R42 Dizziness and giddiness: Secondary | ICD-10-CM | POA: Diagnosis not present

## 2021-04-01 MED ORDER — GADOBUTROL 1 MMOL/ML IV SOLN
6.0000 mL | Freq: Once | INTRAVENOUS | Status: AC | PRN
  Administered 2021-04-01: 7.5 mL via INTRAVENOUS

## 2021-04-11 ENCOUNTER — Encounter: Payer: Self-pay | Admitting: Otolaryngology

## 2021-04-12 ENCOUNTER — Other Ambulatory Visit: Payer: Self-pay | Admitting: Infectious Diseases

## 2021-04-12 DIAGNOSIS — R0602 Shortness of breath: Secondary | ICD-10-CM

## 2021-04-12 DIAGNOSIS — R053 Chronic cough: Secondary | ICD-10-CM

## 2021-04-12 DIAGNOSIS — J438 Other emphysema: Secondary | ICD-10-CM

## 2021-04-12 DIAGNOSIS — B4489 Other forms of aspergillosis: Secondary | ICD-10-CM

## 2021-04-19 ENCOUNTER — Ambulatory Visit
Admission: RE | Admit: 2021-04-19 | Discharge: 2021-04-19 | Disposition: A | Discharge status: Home / Self Care | Payer: BC Managed Care – PPO | Source: Ambulatory Visit | Attending: Infectious Diseases | Admitting: Infectious Diseases

## 2021-04-19 ENCOUNTER — Other Ambulatory Visit: Payer: Self-pay

## 2021-04-19 DIAGNOSIS — R053 Chronic cough: Secondary | ICD-10-CM | POA: Insufficient documentation

## 2021-04-19 DIAGNOSIS — B4489 Other forms of aspergillosis: Secondary | ICD-10-CM | POA: Insufficient documentation

## 2021-04-19 DIAGNOSIS — R0602 Shortness of breath: Secondary | ICD-10-CM | POA: Diagnosis present

## 2021-04-19 DIAGNOSIS — J438 Other emphysema: Secondary | ICD-10-CM

## 2021-05-10 ENCOUNTER — Other Ambulatory Visit: Payer: Self-pay

## 2021-05-10 ENCOUNTER — Inpatient Hospital Stay
Admission: EM | Admit: 2021-05-10 | Discharge: 2021-05-13 | DRG: 378 | Disposition: A | Discharge status: Home / Self Care | Payer: BC Managed Care – PPO | Attending: Internal Medicine | Admitting: Internal Medicine

## 2021-05-10 DIAGNOSIS — D508 Other iron deficiency anemias: Secondary | ICD-10-CM | POA: Diagnosis not present

## 2021-05-10 DIAGNOSIS — D519 Vitamin B12 deficiency anemia, unspecified: Secondary | ICD-10-CM | POA: Diagnosis present

## 2021-05-10 DIAGNOSIS — G8929 Other chronic pain: Secondary | ICD-10-CM | POA: Diagnosis present

## 2021-05-10 DIAGNOSIS — Z85828 Personal history of other malignant neoplasm of skin: Secondary | ICD-10-CM

## 2021-05-10 DIAGNOSIS — Z794 Long term (current) use of insulin: Secondary | ICD-10-CM

## 2021-05-10 DIAGNOSIS — Z79899 Other long term (current) drug therapy: Secondary | ICD-10-CM | POA: Diagnosis not present

## 2021-05-10 DIAGNOSIS — R10817 Generalized abdominal tenderness: Secondary | ICD-10-CM | POA: Insufficient documentation

## 2021-05-10 DIAGNOSIS — J432 Centrilobular emphysema: Secondary | ICD-10-CM | POA: Diagnosis present

## 2021-05-10 DIAGNOSIS — N400 Enlarged prostate without lower urinary tract symptoms: Secondary | ICD-10-CM | POA: Diagnosis present

## 2021-05-10 DIAGNOSIS — D509 Iron deficiency anemia, unspecified: Secondary | ICD-10-CM | POA: Diagnosis present

## 2021-05-10 DIAGNOSIS — J9611 Chronic respiratory failure with hypoxia: Secondary | ICD-10-CM | POA: Diagnosis present

## 2021-05-10 DIAGNOSIS — N179 Acute kidney failure, unspecified: Secondary | ICD-10-CM | POA: Diagnosis present

## 2021-05-10 DIAGNOSIS — Z888 Allergy status to other drugs, medicaments and biological substances status: Secondary | ICD-10-CM | POA: Diagnosis not present

## 2021-05-10 DIAGNOSIS — Z7951 Long term (current) use of inhaled steroids: Secondary | ICD-10-CM

## 2021-05-10 DIAGNOSIS — Z7901 Long term (current) use of anticoagulants: Secondary | ICD-10-CM

## 2021-05-10 DIAGNOSIS — I482 Chronic atrial fibrillation, unspecified: Secondary | ICD-10-CM | POA: Diagnosis present

## 2021-05-10 DIAGNOSIS — E1129 Type 2 diabetes mellitus with other diabetic kidney complication: Secondary | ICD-10-CM | POA: Diagnosis present

## 2021-05-10 DIAGNOSIS — K5520 Angiodysplasia of colon without hemorrhage: Secondary | ICD-10-CM | POA: Diagnosis not present

## 2021-05-10 DIAGNOSIS — K5521 Angiodysplasia of colon with hemorrhage: Secondary | ICD-10-CM | POA: Diagnosis present

## 2021-05-10 DIAGNOSIS — F32A Depression, unspecified: Secondary | ICD-10-CM | POA: Diagnosis present

## 2021-05-10 DIAGNOSIS — E1122 Type 2 diabetes mellitus with diabetic chronic kidney disease: Secondary | ICD-10-CM | POA: Diagnosis present

## 2021-05-10 DIAGNOSIS — R531 Weakness: Secondary | ICD-10-CM

## 2021-05-10 DIAGNOSIS — Z8719 Personal history of other diseases of the digestive system: Secondary | ICD-10-CM

## 2021-05-10 DIAGNOSIS — Z8616 Personal history of COVID-19: Secondary | ICD-10-CM | POA: Diagnosis not present

## 2021-05-10 DIAGNOSIS — I129 Hypertensive chronic kidney disease with stage 1 through stage 4 chronic kidney disease, or unspecified chronic kidney disease: Secondary | ICD-10-CM | POA: Diagnosis present

## 2021-05-10 DIAGNOSIS — K589 Irritable bowel syndrome without diarrhea: Secondary | ICD-10-CM | POA: Diagnosis present

## 2021-05-10 DIAGNOSIS — N1832 Chronic kidney disease, stage 3b: Secondary | ICD-10-CM | POA: Diagnosis present

## 2021-05-10 DIAGNOSIS — G4700 Insomnia, unspecified: Secondary | ICD-10-CM | POA: Diagnosis present

## 2021-05-10 DIAGNOSIS — G894 Chronic pain syndrome: Secondary | ICD-10-CM | POA: Diagnosis present

## 2021-05-10 DIAGNOSIS — Z87891 Personal history of nicotine dependence: Secondary | ICD-10-CM

## 2021-05-10 DIAGNOSIS — K219 Gastro-esophageal reflux disease without esophagitis: Secondary | ICD-10-CM | POA: Diagnosis present

## 2021-05-10 DIAGNOSIS — D649 Anemia, unspecified: Secondary | ICD-10-CM | POA: Diagnosis present

## 2021-05-10 DIAGNOSIS — K5730 Diverticulosis of large intestine without perforation or abscess without bleeding: Secondary | ICD-10-CM | POA: Diagnosis present

## 2021-05-10 DIAGNOSIS — Z91041 Radiographic dye allergy status: Secondary | ICD-10-CM | POA: Diagnosis not present

## 2021-05-10 DIAGNOSIS — I10 Essential (primary) hypertension: Secondary | ICD-10-CM | POA: Diagnosis present

## 2021-05-10 DIAGNOSIS — Z20822 Contact with and (suspected) exposure to covid-19: Secondary | ICD-10-CM | POA: Diagnosis present

## 2021-05-10 DIAGNOSIS — J449 Chronic obstructive pulmonary disease, unspecified: Secondary | ICD-10-CM | POA: Diagnosis present

## 2021-05-10 LAB — BASIC METABOLIC PANEL
Anion gap: 8 (ref 5–15)
BUN: 42 mg/dL — ABNORMAL HIGH (ref 8–23)
CO2: 23 mmol/L (ref 22–32)
Calcium: 8.5 mg/dL — ABNORMAL LOW (ref 8.9–10.3)
Chloride: 103 mmol/L (ref 98–111)
Creatinine, Ser: 2.47 mg/dL — ABNORMAL HIGH (ref 0.61–1.24)
GFR, Estimated: 26 mL/min — ABNORMAL LOW (ref >60)
Glucose, Bld: 122 mg/dL — ABNORMAL HIGH (ref 70–99)
Potassium: 4.4 mmol/L (ref 3.5–5.1)
Sodium: 134 mmol/L — ABNORMAL LOW (ref 135–145)

## 2021-05-10 LAB — FERRITIN: Ferritin: 10 ng/mL — ABNORMAL LOW (ref 24–336)

## 2021-05-10 LAB — APTT: aPTT: 59 seconds — ABNORMAL HIGH (ref 24–36)

## 2021-05-10 LAB — ABO/RH: ABO/RH(D): A POS

## 2021-05-10 LAB — PROTIME-INR
INR: 2.4 — ABNORMAL HIGH (ref 0.8–1.2)
Prothrombin Time: 25.8 seconds — ABNORMAL HIGH (ref 11.4–15.2)

## 2021-05-10 LAB — HEMOGLOBIN A1C
Hgb A1c MFr Bld: 5.6 % (ref 4.8–5.6)
Mean Plasma Glucose: 114.02 mg/dL

## 2021-05-10 LAB — URINALYSIS, ROUTINE W REFLEX MICROSCOPIC
Bilirubin Urine: NEGATIVE
Glucose, UA: NEGATIVE mg/dL
Hgb urine dipstick: NEGATIVE
Ketones, ur: NEGATIVE mg/dL
Leukocytes,Ua: NEGATIVE
Nitrite: NEGATIVE
Protein, ur: NEGATIVE mg/dL
Specific Gravity, Urine: 1.009 (ref 1.005–1.030)
pH: 5 (ref 5.0–8.0)

## 2021-05-10 LAB — CBC
HCT: 20.2 % — ABNORMAL LOW (ref 39.0–52.0)
HCT: 24 % — ABNORMAL LOW (ref 39.0–52.0)
Hemoglobin: 6 g/dL — ABNORMAL LOW (ref 13.0–17.0)
Hemoglobin: 7.7 g/dL — ABNORMAL LOW (ref 13.0–17.0)
MCH: 27.4 pg (ref 26.0–34.0)
MCH: 27.8 pg (ref 26.0–34.0)
MCHC: 29.7 g/dL — ABNORMAL LOW (ref 30.0–36.0)
MCHC: 32.1 g/dL (ref 30.0–36.0)
MCV: 92.2 fL (ref 80.0–100.0)
MCV: 86.6 fL (ref 80.0–100.0)
Platelets: 298 10*3/uL (ref 150–400)
Platelets: 280 10*3/uL (ref 150–400)
RBC: 2.19 MIL/uL — ABNORMAL LOW (ref 4.22–5.81)
RBC: 2.77 MIL/uL — ABNORMAL LOW (ref 4.22–5.81)
RDW: 15.7 % — ABNORMAL HIGH (ref 11.5–15.5)
RDW: 17.9 % — ABNORMAL HIGH (ref 11.5–15.5)
WBC: 7.9 10*3/uL (ref 4.0–10.5)
WBC: 7.4 10*3/uL (ref 4.0–10.5)
nRBC: 0 % (ref 0.0–0.2)
nRBC: 0.3 % — ABNORMAL HIGH (ref 0.0–0.2)

## 2021-05-10 LAB — IRON AND TIBC
Iron: 135 ug/dL (ref 45–182)
Saturation Ratios: 35 % (ref 17.9–39.5)
TIBC: 385 ug/dL (ref 250–450)
UIBC: 250 ug/dL

## 2021-05-10 LAB — BRAIN NATRIURETIC PEPTIDE: B Natriuretic Peptide: 57 pg/mL (ref 0.0–100.0)

## 2021-05-10 LAB — RETICULOCYTES
Immature Retic Fract: 33.5 % — ABNORMAL HIGH (ref 2.3–15.9)
RBC.: 2.81 MIL/uL — ABNORMAL LOW (ref 4.22–5.81)
Retic Count, Absolute: 69.4 10*3/uL (ref 19.0–186.0)
Retic Ct Pct: 2.5 % (ref 0.4–3.1)

## 2021-05-10 LAB — FOLATE: Folate: 17.3 ng/mL (ref >5.9)

## 2021-05-10 LAB — RESP PANEL BY RT-PCR (FLU A&B, COVID) ARPGX2
Influenza A by PCR: NEGATIVE
Influenza B by PCR: NEGATIVE
SARS Coronavirus 2 by RT PCR: NEGATIVE

## 2021-05-10 LAB — PREPARE RBC (CROSSMATCH)

## 2021-05-10 LAB — GLUCOSE, CAPILLARY: Glucose-Capillary: 89 mg/dL (ref 70–99)

## 2021-05-10 LAB — VITAMIN B12: Vitamin B-12: 281 pg/mL (ref 180–914)

## 2021-05-10 MED ORDER — MEGESTROL ACETATE 400 MG/10ML PO SUSP
625.0000 mg | Freq: Every day | ORAL | Status: DC
  Administered 2021-05-11 – 2021-05-12 (×2): 625 mg via ORAL
  Filled 2021-05-10 (×4): qty 20

## 2021-05-10 MED ORDER — OXYCODONE HCL 5 MG PO TABS
5.0000 mg | ORAL_TABLET | Freq: Four times a day (QID) | ORAL | Status: DC | PRN
  Administered 2021-05-11: 5 mg via ORAL
  Filled 2021-05-10: qty 1

## 2021-05-10 MED ORDER — BUDESONIDE 0.5 MG/2ML IN SUSP
1.0000 mg | Freq: Every day | RESPIRATORY_TRACT | Status: DC
  Administered 2021-05-10 – 2021-05-12 (×3): 1 mg via RESPIRATORY_TRACT
  Filled 2021-05-10 (×5): qty 4

## 2021-05-10 MED ORDER — SODIUM CHLORIDE 0.9 % IV SOLN
10.0000 mL/h | Freq: Once | INTRAVENOUS | Status: AC
  Administered 2021-05-10: 10 mL/h via INTRAVENOUS

## 2021-05-10 MED ORDER — ONDANSETRON HCL 4 MG/2ML IJ SOLN
4.0000 mg | Freq: Three times a day (TID) | INTRAMUSCULAR | Status: DC | PRN
  Administered 2021-05-10 – 2021-05-13 (×5): 4 mg via INTRAVENOUS
  Filled 2021-05-10 (×5): qty 2

## 2021-05-10 MED ORDER — DM-GUAIFENESIN ER 30-600 MG PO TB12
1.0000 | ORAL_TABLET | Freq: Two times a day (BID) | ORAL | Status: DC | PRN
  Administered 2021-05-11: 1 via ORAL
  Filled 2021-05-10 (×2): qty 1

## 2021-05-10 MED ORDER — TAMSULOSIN HCL 0.4 MG PO CAPS
0.4000 mg | ORAL_CAPSULE | Freq: Every day | ORAL | Status: DC
  Administered 2021-05-10 – 2021-05-12 (×3): 0.4 mg via ORAL
  Filled 2021-05-10 (×3): qty 1

## 2021-05-10 MED ORDER — HYOSCYAMINE SULFATE 0.125 MG SL SUBL
0.1250 mg | SUBLINGUAL_TABLET | SUBLINGUAL | Status: DC | PRN
  Filled 2021-05-10: qty 1

## 2021-05-10 MED ORDER — IPRATROPIUM BROMIDE 0.02 % IN SOLN
0.5000 mg | Freq: Two times a day (BID) | RESPIRATORY_TRACT | Status: DC
  Administered 2021-05-10 – 2021-05-12 (×5): 0.5 mg via RESPIRATORY_TRACT
  Filled 2021-05-10 (×7): qty 2.5

## 2021-05-10 MED ORDER — MIRTAZAPINE 15 MG PO TABS
15.0000 mg | ORAL_TABLET | Freq: Every day | ORAL | Status: DC
  Administered 2021-05-10 – 2021-05-12 (×3): 15 mg via ORAL
  Filled 2021-05-10 (×3): qty 1

## 2021-05-10 MED ORDER — OXYCODONE-ACETAMINOPHEN 10-325 MG PO TABS: 1.0000 | ORAL_TABLET | Freq: Four times a day (QID) | ORAL | Status: DC | PRN

## 2021-05-10 MED ORDER — ADULT MULTIVITAMIN W/MINERALS CH
1.0000 | ORAL_TABLET | Freq: Every day | ORAL | Status: DC
  Administered 2021-05-10 – 2021-05-12 (×3): 1 via ORAL
  Filled 2021-05-10 (×3): qty 1

## 2021-05-10 MED ORDER — INSULIN ASPART 100 UNIT/ML IJ SOLN: 0.0000 [IU] | Freq: Every day | INTRAMUSCULAR | Status: DC

## 2021-05-10 MED ORDER — TIOTROPIUM BROMIDE MONOHYDRATE 18 MCG IN CAPS
18.0000 ug | ORAL_CAPSULE | Freq: Every day | RESPIRATORY_TRACT | Status: DC
  Administered 2021-05-11 – 2021-05-12 (×2): 18 ug via RESPIRATORY_TRACT
  Filled 2021-05-10: qty 5

## 2021-05-10 MED ORDER — SODIUM CHLORIDE 0.9 % IV SOLN: INTRAVENOUS | Status: DC

## 2021-05-10 MED ORDER — AMIODARONE HCL 200 MG PO TABS
200.0000 mg | ORAL_TABLET | Freq: Every day | ORAL | Status: DC
  Administered 2021-05-10 – 2021-05-12 (×3): 200 mg via ORAL
  Filled 2021-05-10 (×3): qty 1

## 2021-05-10 MED ORDER — ACETAMINOPHEN 325 MG PO TABS: 650.0000 mg | ORAL_TABLET | Freq: Four times a day (QID) | ORAL | Status: DC | PRN

## 2021-05-10 MED ORDER — PANTOPRAZOLE SODIUM 40 MG IV SOLR
40.0000 mg | Freq: Two times a day (BID) | INTRAVENOUS | Status: DC
  Administered 2021-05-11 – 2021-05-12 (×5): 40 mg via INTRAVENOUS
  Filled 2021-05-10 (×5): qty 40

## 2021-05-10 MED ORDER — HYDRALAZINE HCL 20 MG/ML IJ SOLN: 5.0000 mg | INTRAMUSCULAR | Status: DC | PRN

## 2021-05-10 MED ORDER — MECLIZINE HCL 25 MG PO TABS
12.5000 mg | ORAL_TABLET | Freq: Three times a day (TID) | ORAL | Status: DC | PRN
  Administered 2021-05-11: 12.5 mg via ORAL
  Filled 2021-05-10 (×2): qty 0.5

## 2021-05-10 MED ORDER — OXYCODONE-ACETAMINOPHEN 5-325 MG PO TABS
1.0000 | ORAL_TABLET | Freq: Four times a day (QID) | ORAL | Status: DC | PRN
  Administered 2021-05-11 – 2021-05-12 (×4): 1 via ORAL
  Filled 2021-05-10 (×5): qty 1

## 2021-05-10 MED ORDER — ALBUTEROL SULFATE (2.5 MG/3ML) 0.083% IN NEBU: 3.0000 mL | INHALATION_SOLUTION | RESPIRATORY_TRACT | Status: DC | PRN

## 2021-05-10 MED ORDER — MOMETASONE FURO-FORMOTEROL FUM 200-5 MCG/ACT IN AERO
2.0000 | INHALATION_SPRAY | Freq: Two times a day (BID) | RESPIRATORY_TRACT | Status: DC
  Administered 2021-05-10 – 2021-05-12 (×5): 2 via RESPIRATORY_TRACT
  Filled 2021-05-10: qty 8.8

## 2021-05-10 MED ORDER — SODIUM CHLORIDE 0.9 % IV BOLUS
1000.0000 mL | Freq: Once | INTRAVENOUS | Status: AC
Start: 2021-05-10 — End: 2021-05-10
  Administered 2021-05-10: 1000 mL via INTRAVENOUS

## 2021-05-10 MED ORDER — INSULIN ASPART 100 UNIT/ML IJ SOLN
0.0000 [IU] | Freq: Three times a day (TID) | INTRAMUSCULAR | Status: DC
  Administered 2021-05-12 (×2): 1 [IU] via SUBCUTANEOUS
  Filled 2021-05-10 (×2): qty 1

## 2021-05-10 MED ORDER — OXYCODONE HCL ER 10 MG PO T12A
10.0000 mg | EXTENDED_RELEASE_TABLET | Freq: Three times a day (TID) | ORAL | Status: DC
  Administered 2021-05-10 – 2021-05-13 (×10): 10 mg via ORAL
  Filled 2021-05-10 (×10): qty 1

## 2021-05-10 NOTE — ED Notes (Signed)
Handoff given to [place]. Given okay to send patient up once room is clean.

## 2021-05-10 NOTE — H&P (Signed)
History and Physical    Riley Martin WUJ:[id number] DOB: 23-Apr-1946 DOA: 05/10/2021  Referring MD/NP/PA:   PCP: [name], MD   Patient coming from:  The patient is coming from home.  At baseline, pt is independent for most of ADL.        Chief Complaint: dizziness  HPI: Riley Martin is a 76 y.o. male with medical history significant of A fib on Xarelto, hypertension, diabetes mellitus, COPD on 4-6 L oxygen, GERD, depression, IBS, BPH, former smoker, CKD-3B, chronic pain syndrome, who presents with dizziness.  Patient states that he has dizziness and lightheadedness with standing up.  He also has nausea, denies vomiting, diarrhea or abdominal pain.  Denies dark stool or rectal bleeding.  Last bowel movement was 2 days ago with brown stool. Pt was seen by GI in clinic today and was found to have low Hgb 5.7.  His hemoglobin was 9.2 on 03/24/2021.  Pt was sent to ED for further evaluation and treatment.  Patient has severe COPD on 4-6 L oxygen, with chronic shortness of breath, which has not changed.  Denies chest pain, cough, fever or chills.  No symptoms of UTI.  No unilateral numbness or tinglings extremities with no facial droop or slurred speech.  Of note, patient had EGD done 03/14/2021 which normal stomach and duodenum.  Patient took his last dose of Xarelto last night.  ED Course: pt was found to have hemoglobin 6.0, WBC 7.9, negative COVID PCR, worsening renal function, temperature normal, blood pressure 155/131, heart rate 83, RR 16, oxygen saturation 98% on 4 L oxygen.  Patient is placed MedSurg [place] for observation. ED physician attempted to get a stool sample by rectal exam, but patient does not have any stool for test.  FOBT was not done.  Review of Systems:   General: no fevers, chills, no body weight gain, has fatigue HEENT: no blurry vision, hearing changes or sore throat Respiratory: has dyspnea, no coughing, wheezing CV: no chest pain, no  palpitations GI: no nausea, vomiting, abdominal pain, diarrhea, constipation GU: no dysuria, burning on urination, increased urinary frequency, hematuria  Ext: no leg edema Neuro: no unilateral weakness, numbness, or tingling, no vision change or hearing loss Skin: no rash, no skin tear. MSK: No muscle spasm, no deformity, no limitation of range of movement in spin Heme: No easy bruising.  Travel history: No recent long distant travel.  Allergy:  Allergies  Allergen Reactions   Iodinated Contrast Media Other (See Comments) and Nausea Only    Dry heaves, patient states he felt like he was on fire and about to pass out.  Pre-syncope, burning  Dry heaves, patient states he felt like he was on fire and about to pass out.    Lisinopril Swelling    Tongue swelling, neck pain and Headaches    Tramadol Other (See Comments) and Shortness Of Breath    Other reaction(s): Other (See Comments) Other Reaction: tachy Dizziness and unsteady gait.   Pregabalin Palpitations    Other reaction(s): Other (See Comments) Other Reaction: edema    Past Medical History:  Diagnosis Date   Arthritis    BPH (benign prostatic hyperplasia)    Cancer (HCC)    skin cancer   Chronic pain    COPD (chronic obstructive pulmonary disease) (HCC)    COVID    GERD (gastroesophageal reflux disease)    Headache    History of insomnia    IBS (irritable bowel syndrome)    Orthostatic  dizziness    PONV (postoperative nausea and vomiting)    Spinal stenosis    Torn rotator cuff    left    Past Surgical History:  Procedure Laterality Date   COLONOSCOPY     ESOPHAGOGASTRODUODENOSCOPY N/A 03/14/2021   Procedure: ESOPHAGOGASTRODUODENOSCOPY (EGD);  Surgeon: [name], DO;  Location: [facility] ENDOSCOPY;  Service: Gastroenterology;  Laterality: N/A;   EXCISION CHONCHA BULLOSA Bilateral 06/02/2015   Procedure: BILATERAL CHONCHA BULLOSA;  Surgeon: Carloyn Manner, MD;  Location: [place];  Service:  ENT;  Laterality: Bilateral;   HERNIA REPAIR     MAXILLARY ANTROSTOMY Bilateral 06/02/2015   Procedure: MAXILLARY ANTROSTOMY;  Surgeon: Carloyn Manner, MD;  Location: [place];  Service: ENT;  Laterality: Bilateral;   NASAL POLYP SURGERY     NECK SURGERY  05/08/2006   no limitations   UPPER GASTROINTESTINAL ENDOSCOPY      Social History:  reports that he quit smoking about 3 years ago. His smoking use included cigarettes. He has a 57.00 pack-year smoking history. He has never used smokeless tobacco. He reports that he does not drink alcohol and does not use drugs.  Family History: No family history on file.   Prior to Admission medications   Medication Sig Start Date End Date Taking? Authorizing Provider  albuterol (PROVENTIL) (2.5 MG/3ML) 0.083% nebulizer solution Take 3 mLs by nebulization every 6 (six) hours as needed for wheezing or shortness of breath. 12/13/20   [provider]  amiodarone (PACERONE) 200 MG tablet Take 1 tablet (200 mg total) by mouth 2 (two) times daily. 12/29/20   Nicole Kindred A, DO  blood glucose meter kit and supplies KIT Dispense based on patient and insurance preference. Use up to four times daily as directed. 12/29/20   Nicole Kindred A, DO  budesonide (PULMICORT) 1 MG/2ML nebulizer solution Take 1 mg by nebulization daily.    [provider]  fluticasone (FLONASE) 50 MCG/ACT nasal spray SPRAY 2 SPRAYS INTO EACH NOSTRIL EVERY DAY 02/07/17   Laverle Hobby, MD  hyoscyamine (LEVSIN SL) 0.125 MG SL tablet Place 0.125 mg under the tongue every 4 (four) hours as needed.    [provider]  insulin lispro (HUMALOG KWIKPEN) 100 UNIT/ML KwikPen Inject 0-9 Units into the skin 3 (three) times daily with meals. Per Sliding Scale Instructions provided 12/29/20   Nicole Kindred A, DO  Insulin Pen Needle (PEN NEEDLES) 31G X 6 MM MISC 1 each by Does not apply route 3 (three) times daily with meals. 12/29/20   Nicole Kindred A, DO   ipratropium (ATROVENT) 0.02 % nebulizer solution Take 0.5 mg by nebulization 2 (two) times daily.    [provider]  levalbuterol Penne Lash HFA) 45 MCG/ACT inhaler Inhale 2 puffs into the lungs every 4 (four) hours as needed for wheezing.    [provider]  megestrol (MEGACE ES) 625 MG/5ML suspension Take 625 mg by mouth daily. 08/25/20   [provider]  metoprolol tartrate (LOPRESSOR) 50 MG tablet Take 1 tablet (50 mg total) by mouth 2 (two) times daily. 12/29/20   Nicole Kindred A, DO  mirtazapine (REMERON) 15 MG tablet Take 15 mg by mouth at bedtime. 12/14/20   [provider]  mometasone-formoterol (DULERA) 200-5 MCG/ACT AERO Inhale 2 puffs into the lungs 2 (two) times daily. 12/29/20   Ezekiel Slocumb, DO  Multiple Vitamin (MULTIVITAMIN WITH MINERALS) TABS tablet Take 1 tablet by mouth daily. 12/30/20   Ezekiel Slocumb, DO  Nutritional Supplements (FEEDING  SUPPLEMENT, NEPRO CARB STEADY,) LIQD Take 237 mLs by mouth 3 (three) times daily between meals. 12/29/20   Nicole Kindred A, DO  ondansetron (ZOFRAN-ODT) 4 MG disintegrating tablet Take 4 mg by mouth every 8 (eight) hours as needed for nausea or vomiting.    [provider]  oxyCODONE-acetaminophen (PERCOCET) 10-325 MG tablet Take 1 tablet by mouth 5 (five) times daily as needed. 05/23/20   [provider]  OXYCONTIN 10 MG 12 hr tablet Take 10 mg by mouth 3 (three) times daily. 12/22/20   [provider]  pantoprazole (PROTONIX) 20 MG tablet Take 20 mg by mouth daily. 06/15/20   [provider]  Rivaroxaban (XARELTO) 15 MG TABS tablet Take 1 tablet (15 mg total) by mouth daily with supper. Patient not taking: Reported on 03/14/2021 12/29/20   Nicole Kindred A, DO  sodium chloride (OCEAN) 0.65 % SOLN nasal spray Place 1 spray into both nostrils as needed for congestion. 12/29/20   Ezekiel Slocumb, DO  tamsulosin (FLOMAX) 0.4 MG CAPS capsule Take 0.4 mg by mouth daily. 09/03/16    [provider]  tiotropium (SPIRIVA) 18 MCG inhalation capsule Place 1 capsule (18 mcg total) into inhaler and inhale daily. 12/30/20   Ezekiel Slocumb, DO  VENTOLIN HFA 108 (90 Base) MCG/ACT inhaler Inhale 2 puffs into the lungs every 6 (six) hours as needed. 08/07/16   [provider]    Physical Exam: Vitals:   05/10/21 1136 05/10/21 1200 05/10/21 1333 05/10/21 1349  BP: (!) 144/60 (!) 155/131 (!) 153/61 128/60  Pulse: 83 76 71 71  Resp: _0 Temp:   97.9 F (36.6 C) 97.9 F (36.6 C)  TempSrc:    Oral  SpO2: 100% 100% 100% 100%  Weight:      Height:       General: Not in acute distress. Pale looking.  HEENT:       Eyes: PERRL, EOMI, no scleral icterus.       ENT: No discharge from the ears and nose, no pharynx injection, no tonsillar enlargement.        Neck: No JVD, no bruit, no mass felt. Heme: No neck lymph node enlargement. Cardiac: S1/S2, RRR, No murmurs, No gallops or rubs. Respiratory: No rales, wheezing, rhonchi or rubs. GI: Soft, nondistended, has mild diffused discomfort on palpation, no rebound pain, no organomegaly, BS present. GU: No hematuria Ext: No pitting leg edema bilaterally. 1+DP/PT pulse bilaterally. Musculoskeletal: No joint deformities, No joint redness or warmth, no limitation of ROM in spin. Skin: No rashes.  Neuro: Alert, oriented X3, cranial nerves II-XII grossly intact, moves all extremities normally.  Psych: Patient is not psychotic, no suicidal or hemocidal ideation.  Labs on Admission: I have personally reviewed following labs and imaging studies  CBC: Recent Labs  Lab 05/10/21 1127  WBC 7.9  HGB 6.0*  HCT 20.2*  MCV 92.2  PLT 242   Basic Metabolic Panel: Recent Labs  Lab 05/10/21 1127  NA 134*  K 4.4  CL 103  CO2 23  GLUCOSE 122*  BUN 42*  CREATININE 2.47*  CALCIUM 8.5*   GFR: Estimated Creatinine Clearance: 26.5 mL/min (A) (by C-G formula based on SCr of 2.47 mg/dL (H)). Liver Function  Tests: No results for input(s): AST, ALT, ALKPHOS, BILITOT, PROT, ALBUMIN in the last 168 hours. No results for input(s): LIPASE, AMYLASE in the last 168 hours. No results for input(s): AMMONIA in the last 168 hours. Coagulation Profile: No results  for input(s): INR, PROTIME in the last 168 hours. Cardiac Enzymes: No results for input(s): CKTOTAL, CKMB, CKMBINDEX, TROPONINI in the last 168 hours. BNP (last 3 results) No results for input(s): PROBNP in the last 8760 hours. HbA1C: No results for input(s): HGBA1C in the last 72 hours. CBG: No results for input(s): GLUCAP in the last 168 hours. Lipid Profile: No results for input(s): CHOL, HDL, LDLCALC, TRIG, CHOLHDL, LDLDIRECT in the last 72 hours. Thyroid Function Tests: No results for input(s): TSH, T4TOTAL, FREET4, T3FREE, THYROIDAB in the last 72 hours. Anemia Panel: No results for input(s): VITAMINB12, FOLATE, FERRITIN, TIBC, IRON, RETICCTPCT in the last 72 hours. Urine analysis:    Component Value Date/Time   COLORURINE STRAW (A) 05/10/2021 1227   APPEARANCEUR CLEAR (A) 05/10/2021 1227   LABSPEC 1.009 05/10/2021 1227   PHURINE 5.0 05/10/2021 1227   GLUCOSEU NEGATIVE 05/10/2021 1227   HGBUR NEGATIVE 05/10/2021 1227   BILIRUBINUR NEGATIVE 05/10/2021 1227   KETONESUR NEGATIVE 05/10/2021 1227   PROTEINUR NEGATIVE 05/10/2021 1227   NITRITE NEGATIVE 05/10/2021 1227   LEUKOCYTESUR NEGATIVE 05/10/2021 1227   Sepsis Labs: _0 (procalcitonin:4,lacticidven:4) ) Recent Results (from the past 240 hour(s))  Resp Panel by RT-PCR (Flu A&B, Covid) Nasopharyngeal Swab     Status: None   Collection Time: 05/10/21 12:27 PM   Specimen: Nasopharyngeal Swab; Nasopharyngeal(NP) swabs in vial transport medium  Result Value Ref Range Status   SARS Coronavirus 2 by RT PCR NEGATIVE NEGATIVE Final    Comment: (NOTE) SARS-CoV-2 target nucleic acids are NOT DETECTED.  The SARS-CoV-2 RNA is generally detectable in upper respiratory specimens  during the acute phase of infection. The lowest concentration of SARS-CoV-2 viral copies this assay can detect is 138 copies/mL. A negative result does not preclude SARS-Cov-2 infection and should not be used as the sole basis for treatment or other patient management decisions. A negative result may occur with  improper specimen collection/handling, submission of specimen other than nasopharyngeal swab, presence of viral mutation(s) within the areas targeted by this assay, and inadequate number of viral copies(<138 copies/mL). A negative result must be combined with clinical observations, patient history, and epidemiological information. The expected result is Negative.  Fact Sheet for Patients:  EntrepreneurPulse.com.au  Fact Sheet for Healthcare Providers:  IncredibleEmployment.be  This test is no t yet approved or cleared by the [place] FDA and  has been authorized for detection and/or diagnosis of SARS-CoV-2 by FDA under an Emergency Use Authorization (EUA). This EUA will remain  in effect (meaning this test can be used) for the duration of the COVID-19 declaration under Section 564(b)(1) of the Act, 21 U.S.C.section 360bbb-3(b)(1), unless the authorization is terminated  or revoked sooner.       Influenza A by PCR NEGATIVE NEGATIVE Final   Influenza B by PCR NEGATIVE NEGATIVE Final    Comment: (NOTE) The Xpert Xpress SARS-CoV-2/FLU/RSV plus assay is intended as an aid in the diagnosis of influenza from Nasopharyngeal swab specimens and should not be used as a sole basis for treatment. Nasal washings and aspirates are unacceptable for Xpert Xpress SARS-CoV-2/FLU/RSV testing.  Fact Sheet for Patients: EntrepreneurPulse.com.au  Fact Sheet for Healthcare Providers: IncredibleEmployment.be  This test is not yet approved or cleared by the [place] FDA and has been authorized for detection  and/or diagnosis of SARS-CoV-2 by FDA under an Emergency Use Authorization (EUA). This EUA will remain in effect (meaning this test can be used) for the duration of the COVID-19 declaration under Section 564(b)(1) of the Act,  21 U.S.C. section 360bbb-3(b)(1), unless the authorization is terminated or revoked.  Performed at [facility], [address] [place],  [address]      Radiological Exams on Admission: No results found.   EKG: I have personally reviewed.  Sinus rhythm, QTC 445, RAD, PVC, early R wave progression, right bundle blockage  Assessment/Plan Principal Problem:   Symptomatic anemia Active Problems:   BPH (benign prostatic hyperplasia)   History of gastroesophageal reflux (GERD)   COPD (chronic obstructive pulmonary disease) (HCC)   Chronic pain   HTN (hypertension)   Type II diabetes mellitus with renal manifestations (HCC)   Atrial fibrillation, chronic (HCC)   Acute renal failure superimposed on stage 3b chronic kidney disease (HCC)   Depression   Symptomatic anemia: Hgb dropped from 9.2 on 11/17/2 to 6.0.  Patient denies dark stool or rectal bleeding. Pt is on Xarelto, may have chronic GI blood loss. Consulted Dr. Vicente Males of GI. Dr. Vicente Males suggested IV iron during hospitalization. Wil follow up anemia panel, if pt has iron deficiency, will need to give IV iron.  - will admit to Med-surg bed-tele as inpatient - hold Xarelto - anemia panel - Check B12 folate, celiac serology per Dr. Vicente Males - transfuse 2 units of blood now - IVF: 1L NS bolus, then at 75 mL/hr - Start IV pantoprazole 40 mg bid - Zofran IV for nausea - Avoid NSAIDs and SQ heparin - Maintain IV access (2 large bore IVs if possible). - Monitor closely and follow q6h cbc, transfuse as necessary, if Hgb<7.0 - La[facility]: INR, PTT and type screen  BPH (benign prostatic hyperplasia) -Flomax  History of gastroesophageal reflux (GERD) - On IV protonix  COPD (chronic obstructive pulmonary  disease) ([place]): Stable -Bronchodilators  Chronic pain -Continue home OxyContin and as needed Percocet  HTN (hypertension) -IV hydralazine as needed -Hold home Lasix and spironolactone due to worsening renal function  Type II diabetes mellitus with renal manifestations ([place]): No A1c available. Patient is not taking medications currently -Sliding scale insulin -Check A1c  Atrial fibrillation, chronic (HCC) -Hold Xarelto -Continue amiodarone  Acute renal failure superimposed on stage 3b chronic kidney disease ([place]): Recent baseline creatinine 1.75 on 12/26/2020.  His creatinine is 2.47, BUN 42.  Likely due to dehydration and continuation of diuretics -Hold Lasix and spironolactone -IV fluid as above  Depression -Continue home medications         DVT ppx: SCD Code Status: Full code Family Communication:  Yes, patient's wife   at bed side.   Disposition Plan:  Anticipate discharge back to previous environment Consults called:  Dr. Vicente Males Admission status and Level of care: Telemetry Medical:   for obs    Status is: Inpatient  Remains inpatient appropriate because: Patient has multiple complaints, now presents with symptomatic anemia, hemoglobin dropped by more than 3 g.  Patient will need endoscopy, but patient has been Xarelto, need to wean off Xarelto for about 48 hours before procedure can be done.  His presentation is highly complicated.  Patient is at high risk of deteriorating.  Need to be treated in hospital for at least 2 days.              Date of Service 05/10/2021    Ivor Costa [facility]   If 7PM-7AM, please contact night-coverage [email or web address] 05/10/2021, 2:27 PM

## 2021-05-10 NOTE — Consult Note (Signed)
Wyline Mood , MD [address], [address], [place], [place], [address] [address], [place], [place], [address] Phone: [phone]  Fax: [phone]  Consultation  Referring Provider:    Dr Clyde Lundborg Primary Care Physician:  Marisue Ivan, MD Primary Gastroenterologist:  Dr. Timothy Lasso         Reason for Consultation:     anemia   Date of Admission:  05/10/2021 Date of Consultation:  05/10/2021         HPI:   Riley Martin is a 76 y.o. male was referred to the emergency room after being seen by Amedeo Kinsman NP at the GI office.  In November had a hemoglobin of 9.2 g.  Macrocytic in nature.  On Xarelto for atrial fibrillation.Blood work done today at the office visit showed hemoglobin of 6 g and a sent to the ER.  Office visit note is not available for review at this point of time?  Not completed.  He is on oxygen long-term for COPD.  Creatinine of 2.47.Which is higher than baseline of 1.7 , UA negative. Recent EGD by Dr. Timothy Lasso on 03/14/2021 was normal.Ferritin was 15 in November 2022.  I do not see any B12 level checked in the setting of macrocytosis.  He denies any overt blood loss in his urine or stool or anywhere else on his body.  His only complaint is fatigue.  He appears to be comfortably sitting in the bed.  His last dose of Xarelto was on Monday night.  Denies any NSAID use.  Last colonoscopy was over 3 years back recollects was normal.  He says he has not received any IV iron    Past Medical History:  Diagnosis Date   Arthritis    BPH (benign prostatic hyperplasia)    Cancer (HCC)    skin cancer   Chronic pain    COPD (chronic obstructive pulmonary disease) (HCC)    COVID    GERD (gastroesophageal reflux disease)    Headache    History of insomnia    IBS (irritable bowel syndrome)    Orthostatic dizziness    PONV (postoperative nausea and vomiting)    Spinal stenosis    Torn rotator cuff    left    Past Surgical History:  Procedure  Laterality Date   COLONOSCOPY     ESOPHAGOGASTRODUODENOSCOPY N/A 03/14/2021   Procedure: ESOPHAGOGASTRODUODENOSCOPY (EGD);  Surgeon: Jaynie Collins, DO;  Location: [facility];  Service: Gastroenterology;  Laterality: N/A;   EXCISION CHONCHA BULLOSA Bilateral 06/02/2015   Procedure: BILATERAL CHONCHA BULLOSA;  Surgeon: Bud Face, MD;  Location: [facility];  Service: ENT;  Laterality: Bilateral;   HERNIA REPAIR     MAXILLARY ANTROSTOMY Bilateral 06/02/2015   Procedure: MAXILLARY ANTROSTOMY;  Surgeon: Bud Face, MD;  Location: [facility];  Service: ENT;  Laterality: Bilateral;   NASAL POLYP SURGERY     NECK SURGERY  05/08/2006   no limitations   UPPER GASTROINTESTINAL ENDOSCOPY      Prior to Admission medications   Medication Sig Start Date End Date Taking? Authorizing Provider  amiodarone (PACERONE) 200 MG tablet Take 1 tablet (200 mg total) by mouth 2 (two) times daily. Patient taking differently: Take 200 mg by mouth daily. 12/29/20  Yes Esaw Grandchild A, DO  furosemide (LASIX) 20 MG tablet Take 20 mg by mouth 2 (two) times daily. 05/03/21  Yes [provider]  megestrol (MEGACE ES) 625 MG/5ML suspension Take 625 mg by mouth daily. 08/25/20  Yes [provider]  mometasone-formoterol (DULERA) 200-5 MCG/ACT AERO Inhale 2 puffs into the lungs 2 (two) times daily. 12/29/20  Yes Ezekiel Slocumb, DO  Multiple Vitamin (MULTIVITAMIN WITH MINERALS) TABS tablet Take 1 tablet by mouth daily. 12/30/20  Yes Nicole Kindred A, DO  OXYCONTIN 10 MG 12 hr tablet Take 10 mg by mouth 3 (three) times daily. 12/22/20  Yes [provider]  pantoprazole (PROTONIX) 20 MG tablet Take 20 mg by mouth daily. 06/15/20  Yes [provider]  Rivaroxaban (XARELTO) 15 MG TABS tablet Take 1 tablet (15 mg total) by mouth daily with supper. 12/29/20  Yes Nicole Kindred A, DO  spironolactone (ALDACTONE) 25 MG tablet Take 25 mg by mouth daily.  05/03/21  Yes [provider]  tamsulosin (FLOMAX) 0.4 MG CAPS capsule Take 0.4 mg by mouth daily. 09/03/16  Yes [provider]  tiotropium (SPIRIVA) 18 MCG inhalation capsule Place 1 capsule (18 mcg total) into inhaler and inhale daily. 12/30/20  Yes Nicole Kindred A, DO  albuterol (PROVENTIL) (2.5 MG/3ML) 0.083% nebulizer solution Take 3 mLs by nebulization every 6 (six) hours as needed for wheezing or shortness of breath. 12/13/20   [provider]  blood glucose meter kit and supplies KIT Dispense based on patient and insurance preference. Use up to four times daily as directed. 12/29/20   Nicole Kindred A, DO  budesonide (PULMICORT) 1 MG/2ML nebulizer solution Take 1 mg by nebulization daily.    [provider]  fluticasone (FLONASE) 50 MCG/ACT nasal spray SPRAY 2 SPRAYS INTO EACH NOSTRIL EVERY DAY 02/07/17   Laverle Hobby, MD  hyoscyamine (LEVSIN SL) 0.125 MG SL tablet Place 0.125 mg under the tongue every 4 (four) hours as needed.    [provider]  insulin lispro (HUMALOG KWIKPEN) 100 UNIT/ML KwikPen Inject 0-9 Units into the skin 3 (three) times daily with meals. Per Sliding Scale Instructions provided Patient not taking: Reported on 05/10/2021 12/29/20   Nicole Kindred A, DO  Insulin Pen Needle (PEN NEEDLES) 31G X 6 MM MISC 1 each by Does not apply route 3 (three) times daily with meals. 12/29/20   Nicole Kindred A, DO  ipratropium (ATROVENT) 0.02 % nebulizer solution Take 0.5 mg by nebulization 2 (two) times daily.    [provider]  levalbuterol Penne Lash HFA) 45 MCG/ACT inhaler Inhale 2 puffs into the lungs every 4 (four) hours as needed for wheezing.    [provider]  meclizine (ANTIVERT) 12.5 MG tablet Take 12.5 mg by mouth 3 (three) times daily as needed for dizziness. 04/18/21   [provider]  metoprolol tartrate (LOPRESSOR) 50 MG tablet Take 1 tablet (50 mg total) by mouth 2 (two) times daily. Patient not  taking: Reported on 05/10/2021 12/29/20   Nicole Kindred A, DO  mirtazapine (REMERON) 15 MG tablet Take 15 mg by mouth at bedtime. 12/14/20   [provider]  Nutritional Supplements (FEEDING SUPPLEMENT, NEPRO CARB STEADY,) LIQD Take 237 mLs by mouth 3 (three) times daily between meals. 12/29/20   Nicole Kindred A, DO  ondansetron (ZOFRAN-ODT) 4 MG disintegrating tablet Take 4 mg by mouth every 8 (eight) hours as needed for nausea or vomiting.    [provider]  oxyCODONE-acetaminophen (PERCOCET) 10-325 MG tablet Take 1 tablet by mouth 5 (five) times daily as needed. Patient not taking: Reported on 05/10/2021 05/23/20   [provider]  pilocarpine (SALAGEN) 5 MG tablet Take 2.5 mg by mouth 3 (three) times daily. Patient not taking:  Reported on 05/10/2021 05/03/21   [provider]  sodium chloride (OCEAN) 0.65 % SOLN nasal spray Place 1 spray into both nostrils as needed for congestion. 12/29/20   Ezekiel Slocumb, DO  VENTOLIN HFA 108 (90 Base) MCG/ACT inhaler Inhale 2 puffs into the lungs every 6 (six) hours as needed. 08/07/16   [provider]    No family history on file.   Social History   Tobacco Use   Smoking status: Former    Packs/day: 1.00    Years: 57.00    Pack years: 57.00    Types: Cigarettes    Quit date: 2020    Years since quitting: 3.0   Smokeless tobacco: Never  Vaping Use   Vaping Use: Never used  Substance Use Topics   Alcohol use: No   Drug use: No    Allergies as of 05/10/2021 - Review Complete 05/10/2021  Allergen Reaction Noted   Iodinated contrast media Other (See Comments) and Nausea Only 10/14/2014   Lisinopril Swelling 07/12/2020   Tramadol Other (See Comments) and Shortness Of Breath 10/14/2014   Pregabalin Palpitations     Review of Systems:    All systems reviewed and negative except where noted in HPI.   Physical Exam:  Vital signs in last 24 hours: Temp:  [97.9 F (36.6 C)-98.5 F (36.9 C)]  97.9 F (36.6 C) (01/03 1349) Pulse Rate:  [71-83] 71 (01/03 1349) Resp:  [11-16] 16 (01/03 1349) BP: (128-155)/(60-131) 128/60 (01/03 1349) SpO2:  [98 %-100 %] 100 % (01/03 1349) Weight:  [74.8 kg] 74.8 kg (01/03 1125)   General:   Pleasant, cooperative in NAD Head:  Normocephalic and atraumatic. Eyes:   No icterus.   Conjunctiva pink. PERRLA. Ears:  Normal auditory acuity. Neck:  Supple; no masses or thyroidomegaly Lungs: Respirations even and unlabored. Lungs clear to auscultation bilaterally.   No wheezes, crackles, or rhonchi.  Heart:  Regular rate and rhythm;  Without murmur, clicks, rubs or gallops Abdomen:  Soft, nondistended, nontender. Normal bowel sounds. No appreciable masses or hepatomegaly.  No rebound or guarding.  Neurologic:  Alert and oriented x3;  grossly normal neurologically. Skin:  Intact without significant lesions or rashes. Cervical Nodes:  No significant cervical adenopathy. Psych:  Alert and cooperative. Normal affect.  LAB RESULTS: Recent Labs    05/10/21 1127  WBC 7.9  HGB 6.0*  HCT 20.2*  PLT 298   BMET Recent Labs    05/10/21 1127  NA 134*  K 4.4  CL 103  CO2 23  GLUCOSE 122*  BUN 42*  CREATININE 2.47*  CALCIUM 8.5*   LFT No results for input(s): PROT, ALBUMIN, AST, ALT, ALKPHOS, BILITOT, BILIDIR, IBILI in the last 72 hours. PT/INR No results for input(s): LABPROT, INR in the last 72 hours.  STUDIES: No results found.    Impression / Plan:   Riley Martin is a 76 y.o. y/o male with history of COPD, CKD follows with Dr. [name] at [facility] clinic gastroenterology.  Was found to have iron deficiency anemia in November 2022 CBC at that time demonstrated a macrocytic anemia with an MCV of 101 and hemoglobin of 9.2 g.  Today has been sent from the GI clinic to the ER for an incidental finding of hemoglobin of 6 g.  No overt blood loss.  The patient is on Xarelto.  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  No B12 recently checked.  Last colonoscopy was over 3 years  Plan 1.  Suggest IV iron during hospitalization 2.  Transfuse hemoglobin to maintain a hemoglobin above 7 g, Monitor CBC and transfuse as needed 3.  Check B12 folate, celiac serology. 4.  Plan hemoglobin is over 7 g and 2 days off Xarelto we will plan for EGD and colonoscopy on Thursday at the same time  5.  No role for checking stool occult, It is a test meant only for colon cancer screening and has no role in the inpatient or outpatient evaluation of the anemia 6.  Hold Xarelto if okay from the cardiac standpoint  I have discussed alternative options, risks & benefits,  which include, but are not limited to, bleeding, infection, perforation,respiratory complication & drug reaction.  The patient agrees with this plan & written consent will be obtained.    Thank you for involving me in the care of this patient.      LOS: 0 days   Jonathon Bellows, MD  05/10/2021, 2:34 PM

## 2021-05-10 NOTE — ED Triage Notes (Signed)
Pt was sent from [facility] with a hemoglobin of 5.7, pt states that he has been nauseated and feels syncopal with standing, denies seeing any blood in his stool, urine, or when he brushes his gums, pt is on xarelto

## 2021-05-10 NOTE — ED Provider Notes (Signed)
[facility] Provider Note    Event Date/Time   First MD Initiated Contact with Patient 05/10/21 1129     (approximate)   History   low hemoglobin and Weakness   HPI  Riley Martin is a 76 y.o. male who presents to the ED for evaluation of low hemoglobin and Weakness   I review outpatient clinic from earlier today, outpatient GI. I cannot see any blood work results from this visit.  Last CBC I can find is from 11/17 with a hemoglobin of 9.2.  Macrocytic. I review outpatient PCP visit from 1 month ago, 12/6.  Longtime smoking history and COPD. History of A. fib on Xarelto.  EGD 2 months ago without gastric ulcers  Patient presents to the ED, accompanied by his wife and son who provides some supplemental history, for evaluation of low hemoglobin.  Patient reports chronic malaise and weakness that he attributes to getting COVID back in August, and reports an outpatient GI evaluation this morning to help elucidate this.  He had blood work done during this clinic visit which demonstrated a hemoglobin of 5.9 and was sent to the ED.   Patient reports presyncopal dizziness upon standing without any syncopal episodes.  He reports dyspnea on exertion without chest pain.  Reports his breathing is around his baseline, chronic 4-5 L O2.  Denies increased sputum production.  Reports being asymptomatic while seated.  Physical Exam   Triage Vital Signs: ED Triage Vitals  Enc Vitals Group     BP 05/10/21 1124 (!) 145/64     Pulse Rate 05/10/21 1124 82     Resp 05/10/21 1124 16     Temp 05/10/21 1124 98.5 F (36.9 C)     Temp Source 05/10/21 1124 Oral     SpO2 05/10/21 1124 98 %     Weight 05/10/21 1125 165 lb (74.8 kg)     Height 05/10/21 1125 6' (1.829 m)     Head Circumference --      Peak Flow --      Pain Score 05/10/21 1125 0     Pain Loc --      Pain Edu? --      Excl. in [place]? --     Most recent vital signs: Vitals:   05/10/21 1333 05/10/21 1349   BP: (!) 153/61 128/60  Pulse: 71 71  Resp: 16 16  Temp: 97.9 F (36.6 C) 97.9 F (36.6 C)  SpO2: 100% 100%    General: Awake, no distress.  On home oxygen.  Conversational in full sentences. CV:  Good peripheral perfusion. RRR Resp:  Normal effort.  Abd:  No distention.  No tenderness throughout. GU:  Chaperoned by RN, DRE with no stool in the rectal vault.  Attempted Hemoccult, but not enough stool. Other:  Cranial nerves II through XII intact 5/5 strength and sensation in all 4 extremities    ED Results / Procedures / Treatments   Labs (all labs ordered are listed, but only abnormal results are displayed) Labs Reviewed  BASIC METABOLIC PANEL - Abnormal; Notable for the following components:      Result Value   Sodium 134 (*)    Glucose, Bld 122 (*)    BUN 42 (*)    Creatinine, Ser 2.47 (*)    Calcium 8.5 (*)    GFR, Estimated 26 (*)    All other components within normal limits  CBC - Abnormal; Notable for the following components:   RBC 2.19 (*)  Hemoglobin 6.0 (*)    HCT 20.2 (*)    MCHC 29.7 (*)    RDW 15.7 (*)    All other components within normal limits  RESP PANEL BY RT-PCR (FLU A&B, COVID) ARPGX2  URINALYSIS, ROUTINE W REFLEX MICROSCOPIC  HEMOGLOBIN A1C  BRAIN NATRIURETIC PEPTIDE  VITAMIN B12  FOLATE  IRON AND TIBC  FERRITIN  RETICULOCYTES  CBC  CBC  CBC  TYPE AND SCREEN  PREPARE RBC (CROSSMATCH)  ABO/RH    EKG  Sinus rhythm, rate of 74 bpm.  Normal axis.  Slightly short PR interval.  RBBB.  No ischemic features.  RADIOLOGY   Official radiology report(s): No results found.  PROCEDURES and INTERVENTIONS:  .1-3 Lead EKG Interpretation Performed by: Vladimir Crofts, MD Authorized by: Vladimir Crofts, MD     Interpretation: normal     ECG rate:  70   ECG rate assessment: normal     Rhythm: sinus rhythm     Ectopy: none     Conduction: normal   .Critical Care Performed by: Vladimir Crofts, MD Authorized by: Vladimir Crofts, MD   Critical  care provider statement:    Critical care time (minutes):  30   Critical care time was exclusive of:  Separately billable procedures and treating other patients   Critical care was necessary to treat or prevent imminent or life-threatening deterioration of the following conditions:  Circulatory failure   Critical care was time spent personally by me on the following activities:  Development of treatment plan with patient or surrogate, discussions with consultants, evaluation of patient's response to treatment, examination of patient, ordering and review of laboratory studies, ordering and review of radiographic studies, ordering and performing treatments and interventions, pulse oximetry, re-evaluation of patient's condition and review of old charts  Medications  insulin aspart (novoLOG) injection 0-9 Units (has no administration in time range)  insulin aspart (novoLOG) injection 0-5 Units (has no administration in time range)  albuterol (PROVENTIL) (2.5 MG/3ML) 0.083% nebulizer solution 3 mL (has no administration in time range)  dextromethorphan-guaiFENesin (MUCINEX DM) 30-600 MG per 12 hr tablet 1 tablet (has no administration in time range)  ondansetron (ZOFRAN) injection 4 mg (has no administration in time range)  acetaminophen (TYLENOL) tablet 650 mg (has no administration in time range)  hydrALAZINE (APRESOLINE) injection 5 mg (has no administration in time range)  pantoprazole (PROTONIX) injection 40 mg (has no administration in time range)  0.9 %  sodium chloride infusion (10 mL/hr Intravenous New Bag/Given 05/10/21 1339)  sodium chloride 0.9 % bolus 1,000 mL (1,000 mLs Intravenous New Bag/Given 05/10/21 1339)     IMPRESSION / MDM / [place] / ED COURSE  I reviewed the triage vital signs and the nursing notes.  76 year old male presents to the ED with progressively worsening weakness and presyncope, with evidence of symptomatic anemia requiring transfusion medical admission.   He is hemodynamically stable without tachycardia, hypoxia on his home O2 or any signs of shock.  He is no bleeding symptoms while in the ED and denies any at home.  Hemoccult is inconclusive due to no stool in the rectal vault.  Blood work with normocytic anemia with hemoglobin of 6.0, concerning for blood loss anemia, likely GI, but uncertain etiology.  Metabolic panel with worsening AKI on CKD.  He looks clinically well.  After typing, we will provide 2 units of PRBCs and IV fluids.  I counseled with hospitalist who agrees to admit.      FINAL CLINICAL IMPRESSION(S) /  ED DIAGNOSES   Final diagnoses:  Generalized weakness  Symptomatic anemia     Rx / DC Orders   ED Discharge Orders     None        Note:  This document was prepared using Dragon voice recognition software and may include unintentional dictation errors.   Vladimir Crofts, MD 05/10/21 1357

## 2021-05-11 DIAGNOSIS — D649 Anemia, unspecified: Secondary | ICD-10-CM

## 2021-05-11 LAB — CBC
HCT: 22.1 % — ABNORMAL LOW (ref 39.0–52.0)
HCT: 23.1 % — ABNORMAL LOW (ref 39.0–52.0)
HCT: 23.8 % — ABNORMAL LOW (ref 39.0–52.0)
HCT: 27.5 % — ABNORMAL LOW (ref 39.0–52.0)
Hemoglobin: 7.1 g/dL — ABNORMAL LOW (ref 13.0–17.0)
Hemoglobin: 7.2 g/dL — ABNORMAL LOW (ref 13.0–17.0)
Hemoglobin: 7.4 g/dL — ABNORMAL LOW (ref 13.0–17.0)
Hemoglobin: 8.7 g/dL — ABNORMAL LOW (ref 13.0–17.0)
MCH: 27.3 pg (ref 26.0–34.0)
MCH: 27.8 pg (ref 26.0–34.0)
MCH: 27.6 pg (ref 26.0–34.0)
MCH: 28.1 pg (ref 26.0–34.0)
MCHC: 32.1 g/dL (ref 30.0–36.0)
MCHC: 31.2 g/dL (ref 30.0–36.0)
MCHC: 31.1 g/dL (ref 30.0–36.0)
MCHC: 31.6 g/dL (ref 30.0–36.0)
MCV: 85 fL (ref 80.0–100.0)
MCV: 89.2 fL (ref 80.0–100.0)
MCV: 88.8 fL (ref 80.0–100.0)
MCV: 88.7 fL (ref 80.0–100.0)
Platelets: 240 10*3/uL (ref 150–400)
Platelets: 227 10*3/uL (ref 150–400)
Platelets: 249 10*3/uL (ref 150–400)
Platelets: 220 10*3/uL (ref 150–400)
RBC: 2.6 MIL/uL — ABNORMAL LOW (ref 4.22–5.81)
RBC: 2.59 MIL/uL — ABNORMAL LOW (ref 4.22–5.81)
RBC: 2.68 MIL/uL — ABNORMAL LOW (ref 4.22–5.81)
RBC: 3.1 MIL/uL — ABNORMAL LOW (ref 4.22–5.81)
RDW: 18.7 % — ABNORMAL HIGH (ref 11.5–15.5)
RDW: 18.7 % — ABNORMAL HIGH (ref 11.5–15.5)
RDW: 18.7 % — ABNORMAL HIGH (ref 11.5–15.5)
RDW: 17.3 % — ABNORMAL HIGH (ref 11.5–15.5)
WBC: 6.9 10*3/uL (ref 4.0–10.5)
WBC: 6.4 10*3/uL (ref 4.0–10.5)
WBC: 6.1 10*3/uL (ref 4.0–10.5)
WBC: 6.5 10*3/uL (ref 4.0–10.5)
nRBC: 0 % (ref 0.0–0.2)
nRBC: 0 % (ref 0.0–0.2)
nRBC: 0 % (ref 0.0–0.2)
nRBC: 0 % (ref 0.0–0.2)

## 2021-05-11 LAB — BASIC METABOLIC PANEL
Anion gap: 5 (ref 5–15)
BUN: 35 mg/dL — ABNORMAL HIGH (ref 8–23)
CO2: 23 mmol/L (ref 22–32)
Calcium: 8.2 mg/dL — ABNORMAL LOW (ref 8.9–10.3)
Chloride: 109 mmol/L (ref 98–111)
Creatinine, Ser: 2.29 mg/dL — ABNORMAL HIGH (ref 0.61–1.24)
GFR, Estimated: 29 mL/min — ABNORMAL LOW (ref >60)
Glucose, Bld: 92 mg/dL (ref 70–99)
Potassium: 4.3 mmol/L (ref 3.5–5.1)
Sodium: 137 mmol/L (ref 135–145)

## 2021-05-11 LAB — GLUCOSE, CAPILLARY
Glucose-Capillary: 116 mg/dL — ABNORMAL HIGH (ref 70–99)
Glucose-Capillary: 103 mg/dL — ABNORMAL HIGH (ref 70–99)
Glucose-Capillary: 108 mg/dL — ABNORMAL HIGH (ref 70–99)
Glucose-Capillary: 107 mg/dL — ABNORMAL HIGH (ref 70–99)

## 2021-05-11 LAB — PREPARE RBC (CROSSMATCH)

## 2021-05-11 MED ORDER — PEG 3350-KCL-NA BICARB-NACL 420 G PO SOLR
4000.0000 mL | Freq: Once | ORAL | Status: AC
  Administered 2021-05-11: 4000 mL via ORAL
  Filled 2021-05-11: qty 4000

## 2021-05-11 MED ORDER — SODIUM CHLORIDE 0.9 % IV SOLN: INTRAVENOUS | Status: DC

## 2021-05-11 MED ORDER — SODIUM CHLORIDE 0.9% IV SOLUTION: Freq: Once | INTRAVENOUS | Status: AC

## 2021-05-11 MED ORDER — SODIUM CHLORIDE 0.9 % IV SOLN
100.0000 mg | Freq: Once | INTRAVENOUS | Status: AC
  Administered 2021-05-11: 100 mg via INTRAVENOUS
  Filled 2021-05-11: qty 5

## 2021-05-11 MED ORDER — VITAMIN B-12 1000 MCG PO TABS
500.0000 ug | ORAL_TABLET | Freq: Every day | ORAL | Status: DC
  Administered 2021-05-11 – 2021-05-12 (×2): 500 ug via ORAL
  Filled 2021-05-11 (×2): qty 1

## 2021-05-11 NOTE — Progress Notes (Signed)
PROGRESS NOTE    Riley Martin  TZG:[id number] DOB: March 04, 1946 DOA: 05/10/2021 PCP: [name], MD   Chief Complaint  Patient presents with   low hemoglobin   Weakness   Cholesterol numbers I think brief Narrative/Hospital Course:  Riley Martin, 76 y.o. 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 In the ED hemoglobin 6.0 g, blood transfusion initiated and patient admitted GI consulted. Xarelto was held and placed on serial H&H   Subjective: Seen and examined. Overnight no fever, blood pressure stable Completed 2 units PRBC 1/3 evening-H&H 7.1 g Ferritin low at 10 with B12 lower side of normal 759 normal folic acid Reports he feels better after transfusion.  Assessment & Plan:  Severe symptomatic anemia Iron deficiency anemia: On presentation 6 g baseline 9.2, 11/17.  He is on Xarelto currently on hold S/P 2 unit PRBC hemoglobin up but not as expected transfuse 1 more unit PRBC-patient agreeable.  GI following for possible scope tomorrow morning.  add B12 supplement, will give  IV iron. Cont ppi bid, avoid nsaid, heparin. Recent Labs  Lab 05/10/21 1127 05/10/21 1901 05/11/21 0157 05/11/21 0813  HGB 6.0* 7.7* 7.1* 7.2*  HCT 20.2* 24.0* 22.1* 23.1*   Constipation  per GI  BPH -cont iv flomax.  GERD-cont ppi.  COPD-cont bronchodilators  Chronic pain-on home OxyContin and as needed Percocet  HTN: Blood pressure well controlled.  Type II diabetes mellitus with renal manifestations: Well-controlled,hba1c normal,monitor. Recent Labs  Lab 05/10/21 2103 05/11/21 0741 05/11/21 1222  GLUCAP 89 116* 103*    Atrial  fibrillation, chronic: Holding Xarelto due to anemia continue amiodarone.  Acute renal failure superimposed on stage 3b chronic kidney disease .  Baseline creatinine 1.7 on August/21/22, creatinine improved from 2.4, holding diuretics suspect multifactorial in the setting of dehydration and severe anemia and also was on Lasix and Aldactone at home Recent Labs  Lab 05/10/21 1127 05/11/21 0157  BUN 42* 35*  CREATININE 2.47* 2.29*     Depression: Stable continue home Remeron.  DVT prophylaxis: SCDs Start: 05/10/21 1357 Code Status:   Code Status: Full Code Family Communication: plan of care discussed with patient at bedside. Status is: Inpatient Remains inpatient appropriate because: Ongoing management of anemia Disposition: Currently not medically stable for discharge. Anticipated Disposition: home   Objective: Vitals last 24 hrs: Vitals:   05/10/21 2346 05/11/21 0414 05/11/21 0741 05/11/21 1221  BP: (!) 129/53 (!) 119/58 (!) 113/54 101/66  Pulse: 71 73 80 81  Resp: 18 18 16 16   Temp: 98.3 F (36.8 C)  98.8 F (37.1 C) 98.3 F (36.8 C)  TempSrc:      SpO2: 92% 95% 99% 99%  Weight:      Height:       Weight change:   Intake/Output Summary (Last 24 hours) at 05/11/2021 1351 Last data filed at 05/11/2021 0900 Gross per 24 hour  Intake 2154.86 ml  Output --  Net 2154.86 ml   Net IO Since Admission: 2,154.86 mL [05/11/21 1351]   Physical Examination: General exam: AA0x3, weak,older than stated age. HEENT:Oral mucosa moist, Ear/Nose WNL grossly,dentition normal. Respiratory system: B/l  clear BS, no use of accessory  muscle, non tender. Cardiovascular system: S1 & S2 +,No JVD. Gastrointestinal system: Abdomen soft, NT,ND, BS+. Nervous System:Alert, awake, moving extremities. Extremities: edema none, distal peripheral pulses palpable.  Skin: No rashes, no icterus. MSK: Normal muscle bulk, tone, power.  Medications reviewed:  Scheduled Meds:  amiodarone  200 mg Oral QHS    budesonide  1 mg Nebulization Daily   insulin aspart  0-5 Units Subcutaneous QHS   insulin aspart  0-9 Units Subcutaneous TID WC   ipratropium  0.5 mg Nebulization BID   megestrol  625 mg Oral Daily   mirtazapine  15 mg Oral QHS   mometasone-formoterol  2 puff Inhalation BID   multivitamin with minerals  1 tablet Oral Daily   oxyCODONE  10 mg Oral TID   pantoprazole (PROTONIX) IV  40 mg Intravenous Q12H   tamsulosin  0.4 mg Oral Daily   tiotropium  18 mcg Inhalation Daily   Continuous Infusions:  sodium chloride 75 mL/hr at 05/11/21 1660   Diet Order             Diet full liquid Room service appropriate? Yes; Fluid consistency: Thin  Diet effective now                  Weight change:   Wt Readings from Last 3 Encounters:  05/10/21 74.8 kg  03/14/21 68.9 kg  12/23/20 69.2 kg     Consultants:see note  Procedures:see note Antimicrobials: Anti-infectives (From admission, onward)    None      Culture/Microbiology    Component Value Date/Time   SDES SPUTUM 12/25/2020 2117   [place] NONE 12/25/2020 2117   REPTSTATUS 12/26/2020 FINAL 12/25/2020 2117    Other culture-see note  Unresulted Labs (From admission, onward)     Start     Ordered   05/10/21 1443  Celiac Disease Panel  Add-on,   AD        05/10/21 1442   05/10/21 1355  CBC  Now then every 6 hours,   STAT (with TIMED occurrences)      05/10/21 1355            Data Reviewed: I have personally reviewed following labs and imaging studies CBC: Recent Labs  Lab 05/10/21 1127 05/10/21 1901 05/11/21 0157 05/11/21 0813  WBC 7.9 7.4 6.9 6.4  HGB 6.0* 7.7* 7.1* 7.2*  HCT 20.2* 24.0* 22.1* 23.1*  MCV 92.2 86.6 85.0 89.2  PLT 298 280 240 630   Basic Metabolic Panel: Recent Labs  Lab 05/10/21 1127 05/11/21 0157  NA 134* 137  K 4.4 4.3  CL 103 109  CO2 23 23  GLUCOSE 122* 92  BUN 42* 35*  CREATININE 2.47* 2.29*  CALCIUM 8.5* 8.2*   GFR: Estimated Creatinine Clearance: 28.6 mL/min  (A) (by C-G formula based on SCr of 2.29 mg/dL (H)). Liver Function Tests: No results for input(s): AST, ALT, ALKPHOS, BILITOT, PROT, ALBUMIN in the last 168 hours. No results for input(s): LIPASE, AMYLASE in the last 168 hours. No results for input(s): AMMONIA in the last 168 hours. Coagulation Profile: Recent Labs  Lab 05/10/21 1127  INR 2.4*   Cardiac Enzymes: No results for input(s): CKTOTAL, CKMB, CKMBINDEX, TROPONINI in the last 168 hours. BNP (last 3 results) No results for input(s): PROBNP in the last 8760 hours. HbA1C: Recent Labs    05/10/21 1901  HGBA1C 5.6   CBG: Recent Labs  Lab 05/10/21 2103 05/11/21 0741 05/11/21 1222  GLUCAP 89 116* 103*   Lipid Profile:  No results for input(s): CHOL, HDL, LDLCALC, TRIG, CHOLHDL, LDLDIRECT in the last 72 hours. Thyroid Function Tests: No results for input(s): TSH, T4TOTAL, FREET4, T3FREE, THYROIDAB in the last 72 hours. Anemia Panel: Recent Labs    05/10/21 1901  VITAMINB12 281  FOLATE 17.3  FERRITIN 10*  TIBC 385  IRON 135  RETICCTPCT 2.5   Sepsis Labs: No results for input(s): PROCALCITON, LATICACIDVEN in the last 168 hours.  Recent Results (from the past 240 hour(s))  Resp Panel by RT-PCR (Flu A&B, Covid) Nasopharyngeal Swab     Status: None   Collection Time: 05/10/21 12:27 PM   Specimen: Nasopharyngeal Swab; Nasopharyngeal(NP) swabs in vial transport medium  Result Value Ref Range Status   SARS Coronavirus 2 by RT PCR NEGATIVE NEGATIVE Final    Comment: (NOTE) SARS-CoV-2 target nucleic acids are NOT DETECTED.  The SARS-CoV-2 RNA is generally detectable in upper respiratory specimens during the acute phase of infection. The lowest concentration of SARS-CoV-2 viral copies this assay can detect is 138 copies/mL. A negative result does not preclude SARS-Cov-2 infection and should not be used as the sole basis for treatment or other patient management decisions. A negative result may occur with  improper  specimen collection/handling, submission of specimen other than nasopharyngeal swab, presence of viral mutation(s) within the areas targeted by this assay, and inadequate number of viral copies(<138 copies/mL). A negative result must be combined with clinical observations, patient history, and epidemiological information. The expected result is Negative.  Fact Sheet for Patients:  EntrepreneurPulse.com.au  Fact Sheet for Healthcare Providers:  IncredibleEmployment.be  This test is no t yet approved or cleared by the [place] FDA and  has been authorized for detection and/or diagnosis of SARS-CoV-2 by FDA under an Emergency Use Authorization (EUA). This EUA will remain  in effect (meaning this test can be used) for the duration of the COVID-19 declaration under Section 564(b)(1) of the Act, 21 U.S.C.section 360bbb-3(b)(1), unless the authorization is terminated  or revoked sooner.       Influenza A by PCR NEGATIVE NEGATIVE Final   Influenza B by PCR NEGATIVE NEGATIVE Final    Comment: (NOTE) The Xpert Xpress SARS-CoV-2/FLU/RSV plus assay is intended as an aid in the diagnosis of influenza from Nasopharyngeal swab specimens and should not be used as a sole basis for treatment. Nasal washings and aspirates are unacceptable for Xpert Xpress SARS-CoV-2/FLU/RSV testing.  Fact Sheet for Patients: EntrepreneurPulse.com.au  Fact Sheet for Healthcare Providers: IncredibleEmployment.be  This test is not yet approved or cleared by the [place] FDA and has been authorized for detection and/or diagnosis of SARS-CoV-2 by FDA under an Emergency Use Authorization (EUA). This EUA will remain in effect (meaning this test can be used) for the duration of the COVID-19 declaration under Section 564(b)(1) of the Act, 21 U.S.C. section 360bbb-3(b)(1), unless the authorization is terminated or revoked.  Performed at  [facility], [address], [place], [place] [address]      Radiology Studies: No results found.   LOS: 1 day   Antonieta Pert, MD [facility]  05/11/2021, 1:51 PM

## 2021-05-11 NOTE — Progress Notes (Signed)
Nutrition Brief Note  Patient identified on the Malnutrition Screening Tool (MST) Report  Wt Readings from Last 15 Encounters:  05/10/21 74.8 kg  03/14/21 68.9 kg  12/23/20 69.2 kg  07/11/20 65.8 kg  06/23/20 60.8 kg  06/04/17 64.9 kg  09/13/16 64.4 kg  12/24/15 59.4 kg  06/02/15 66.2 kg   Riley Martin is a 76 y.o. male with medical history significant of A fib on Xarelto, hypertension, diabetes mellitus, COPD on 4-6 L oxygen, GERD, depression, IBS, BPH, former smoker, CKD-3B, chronic pain syndrome, who presents with dizziness.  Pt symptomatic anemia.   Reviewed I/O's: +1.9 L x 24 hours   Spoke with pt and son at bedside. Pt reports he feels "so much better" since admission ("it's a wonder what a little bit of blood can do for you").  Pt reports very good appetite; consumed all of his full liquids today. PTA pt reports consuming 3 meals per day- favorite meal is tomato sandwich with [employer] broccoli and cheedar soup.   Pt admits to a 20 pounds weight loss after being diagnosed with COVID about 4 months ago, however, intake has since improved and has regained all of his weight "and then some".   Nutrition-Focused physical exam completed. Findings are no fat depletion, mild muscle depletion, and no edema.  Pt reports decreased mobility secondary to shortness of breath.   Medications reviewed and include remeron and megace.   Labs reviewed: CBGS: 89-116 (inpatient orders for glycemic control are 0-9 units insulin aspart TID with meals).    Current diet order is full liquid, patient is consuming approximately 100% of meals at this time. Labs and medications reviewed.   No nutrition interventions warranted at this time. If nutrition issues arise, please consult RD.   Loistine Chance, RD, LDN, [place] Registered Dietitian II Certified Diabetes Care and Education Specialist Please refer to [facility] for RD and/or RD on-call/weekend/after hours pager

## 2021-05-11 NOTE — Progress Notes (Signed)
Riley Martin , MD [address], [place], [place], [place], [address] [address] [place], [place], [place], [address] Phone: [phone]  Fax: [phone]   Riley Martin is being followed for iron deficiency and B12 deficiency anemia day 1 of follow up   Subjective: Denies any bleeding or GI symtoms or pain    Objective: Vital signs in last 24 hours: Vitals:   05/10/21 2346 05/11/21 0414 05/11/21 0741 05/11/21 1221  BP: (!) 129/53 (!) 119/58 (!) 113/54 101/66  Pulse: 71 73 80 81  Resp: 18 18 16 16   Temp: 98.3 F (36.8 C)  98.8 F (37.1 C) 98.3 F (36.8 C)  TempSrc:      SpO2: 92% 95% 99% 99%  Weight:      Height:       Weight change:   Intake/Output Summary (Last 24 hours) at 05/11/2021 1406 Last data filed at 05/11/2021 0900 Gross per 24 hour  Intake 2154.86 ml  Output --  Net 2154.86 ml     Exam: Heart:: S1S2 present or without murmur or extra heart sounds Lungs: normal Abdomen: soft, nontender, normal bowel sounds   Lab Results: @LABTEST2 @ Micro Results: Recent Results (from the past 240 hour(s))  Resp Panel by RT-PCR (Flu A&B, Covid) Nasopharyngeal Swab     Status: None   Collection Time: 05/10/21 12:27 PM   Specimen: Nasopharyngeal Swab; Nasopharyngeal(NP) swabs in vial transport medium  Result Value Ref Range Status   SARS Coronavirus 2 by RT PCR NEGATIVE NEGATIVE Final    Comment: (NOTE) SARS-CoV-2 target nucleic acids are NOT DETECTED.  The SARS-CoV-2 RNA is generally detectable in upper respiratory specimens during the acute phase of infection. The lowest concentration of SARS-CoV-2 viral copies this assay can detect is 138 copies/mL. A negative result does not preclude SARS-Cov-2 infection and should not be used as the sole basis for treatment or other patient management decisions. A negative result may occur with  improper specimen collection/handling, submission of specimen other than nasopharyngeal swab, presence of viral  mutation(s) within the areas targeted by this assay, and inadequate number of viral copies(<138 copies/mL). A negative result must be combined with clinical observations, patient history, and epidemiological information. The expected result is Negative.  Fact Sheet for Patients:  EntrepreneurPulse.com.au  Fact Sheet for Healthcare Providers:  IncredibleEmployment.be  This test is no t yet approved or cleared by the [place] FDA and  has been authorized for detection and/or diagnosis of SARS-CoV-2 by FDA under an Emergency Use Authorization (EUA). This EUA will remain  in effect (meaning this test can be used) for the duration of the COVID-19 declaration under Section 564(b)(1) of the Act, 21 U.S.C.section 360bbb-3(b)(1), unless the authorization is terminated  or revoked sooner.       Influenza A by PCR NEGATIVE NEGATIVE Final   Influenza B by PCR NEGATIVE NEGATIVE Final    Comment: (NOTE) The Xpert Xpress SARS-CoV-2/FLU/RSV plus assay is intended as an aid in the diagnosis of influenza from Nasopharyngeal swab specimens and should not be used as a sole basis for treatment. Nasal washings and aspirates are unacceptable for Xpert Xpress SARS-CoV-2/FLU/RSV testing.  Fact Sheet for Patients: EntrepreneurPulse.com.au  Fact Sheet for Healthcare Providers: IncredibleEmployment.be  This test is not yet approved or cleared by the [place] FDA and has been authorized for detection and/or diagnosis of SARS-CoV-2 by FDA under an Emergency Use Authorization (EUA). This EUA will remain in effect (meaning this test can be used) for the duration  of the COVID-19 declaration under Section 564(b)(1) of the Act, 21 U.S.C. section 360bbb-3(b)(1), unless the authorization is terminated or revoked.  Performed at [facility], [address] [place], [place] [address]    Studies/Results: No results  found. Medications: I have reviewed the patient's current medications. Scheduled Meds:  sodium chloride   Intravenous Once   amiodarone  200 mg Oral QHS   budesonide  1 mg Nebulization Daily   insulin aspart  0-5 Units Subcutaneous QHS   insulin aspart  0-9 Units Subcutaneous TID WC   ipratropium  0.5 mg Nebulization BID   megestrol  625 mg Oral Daily   mirtazapine  15 mg Oral QHS   mometasone-formoterol  2 puff Inhalation BID   multivitamin with minerals  1 tablet Oral Daily   oxyCODONE  10 mg Oral TID   pantoprazole (PROTONIX) IV  40 mg Intravenous Q12H   polyethylene glycol-electrolytes  4,000 mL Oral Once   tamsulosin  0.4 mg Oral Daily   tiotropium  18 mcg Inhalation Daily   vitamin B-12  500 mcg Oral Daily   Continuous Infusions:  sodium chloride 75 mL/hr at 05/11/21 8088   sodium chloride     iron sucrose     PRN Meds:.acetaminophen, albuterol, dextromethorphan-guaiFENesin, hydrALAZINE, hyoscyamine, meclizine, ondansetron (ZOFRAN) IV, oxyCODONE **AND** oxyCODONE-acetaminophen   Assessment: Principal Problem:   Symptomatic anemia Active Problems:   BPH (benign prostatic hyperplasia)   History of gastroesophageal reflux (GERD)   COPD (chronic obstructive pulmonary disease) (HCC)   Chronic pain   HTN (hypertension)   Type II diabetes mellitus with renal manifestations (HCC)   Atrial fibrillation, chronic (HCC)   Acute renal failure superimposed on stage 3b chronic kidney disease ([place])   Depression Riley Martin is a 75 y.o. y/o male with history of COPD, CKD follows with Dr. [name] at [facility] clinic gastroenterology.  Was found to have iron deficiency anemia in November 2022 CBC at that time demonstrated a macrocytic anemia with an MCV of 101 and hemoglobin of 9.2 g.  Today has been sent from the GI clinic to the ER for an incidental finding of hemoglobin of 6 g.  No overt blood loss.  The patient is on Xarelto.  Iron studies show a very low ferritin and B12 is  borderline and would benefit from supplementation likely has dimorphic anemia    Plan 1.  Suggest IV iron during hospitalization.  Please replace vitamin B12 2.  Transfuse hemoglobin to maintain a hemoglobin above 7 g, Monitor CBC and transfuse as needed 3.  Follow-up celiac serology 4.  When he is 2 days off Xarelto we will plan for EGD and colonoscopy which will be on  Thursday ie tomorrow at the same time  5.  No role for checking stool occult, It is a test meant only for colon cancer screening and has no role in the inpatient or outpatient evaluation of the anemia    LOS: 1 day   Riley Bellows, MD 05/11/2021, 2:06 PM

## 2021-05-12 ENCOUNTER — Encounter: Payer: Self-pay | Admitting: Internal Medicine

## 2021-05-12 DIAGNOSIS — D649 Anemia, unspecified: Secondary | ICD-10-CM | POA: Diagnosis not present

## 2021-05-12 LAB — CBC
HCT: 30.5 % — ABNORMAL LOW (ref 39.0–52.0)
HCT: 31.4 % — ABNORMAL LOW (ref 39.0–52.0)
Hemoglobin: 9.6 g/dL — ABNORMAL LOW (ref 13.0–17.0)
Hemoglobin: 10.1 g/dL — ABNORMAL LOW (ref 13.0–17.0)
MCH: 27.7 pg (ref 26.0–34.0)
MCH: 28.5 pg (ref 26.0–34.0)
MCHC: 31.5 g/dL (ref 30.0–36.0)
MCHC: 32.2 g/dL (ref 30.0–36.0)
MCV: 88.2 fL (ref 80.0–100.0)
MCV: 88.7 fL (ref 80.0–100.0)
Platelets: 240 10*3/uL (ref 150–400)
Platelets: 273 10*3/uL (ref 150–400)
RBC: 3.46 MIL/uL — ABNORMAL LOW (ref 4.22–5.81)
RBC: 3.54 MIL/uL — ABNORMAL LOW (ref 4.22–5.81)
RDW: 17.4 % — ABNORMAL HIGH (ref 11.5–15.5)
RDW: 17.2 % — ABNORMAL HIGH (ref 11.5–15.5)
WBC: 7.6 10*3/uL (ref 4.0–10.5)
WBC: 7.9 10*3/uL (ref 4.0–10.5)
nRBC: 0 % (ref 0.0–0.2)
nRBC: 0 % (ref 0.0–0.2)

## 2021-05-12 LAB — BASIC METABOLIC PANEL
Anion gap: 10 (ref 5–15)
BUN: 30 mg/dL — ABNORMAL HIGH (ref 8–23)
CO2: 23 mmol/L (ref 22–32)
Calcium: 8.6 mg/dL — ABNORMAL LOW (ref 8.9–10.3)
Chloride: 107 mmol/L (ref 98–111)
Creatinine, Ser: 1.98 mg/dL — ABNORMAL HIGH (ref 0.61–1.24)
GFR, Estimated: 34 mL/min — ABNORMAL LOW (ref >60)
Glucose, Bld: 91 mg/dL (ref 70–99)
Potassium: 4.7 mmol/L (ref 3.5–5.1)
Sodium: 140 mmol/L (ref 135–145)

## 2021-05-12 LAB — BPAM RBC
Blood Product Expiration Date: 202301132359
Blood Product Expiration Date: 202301152359
Blood Product Expiration Date: 202301202359
ISSUE DATE / TIME: 202301031314
ISSUE DATE / TIME: 202301031541
ISSUE DATE / TIME: 202301041746
Unit Type and Rh: 6200
Unit Type and Rh: 6200
Unit Type and Rh: 6200

## 2021-05-12 LAB — TYPE AND SCREEN
ABO/RH(D): A POS
Antibody Screen: NEGATIVE
Unit division: 0
Unit division: 0
Unit division: 0

## 2021-05-12 LAB — GLUCOSE, CAPILLARY
Glucose-Capillary: 85 mg/dL (ref 70–99)
Glucose-Capillary: 140 mg/dL — ABNORMAL HIGH (ref 70–99)
Glucose-Capillary: 101 mg/dL — ABNORMAL HIGH (ref 70–99)

## 2021-05-12 MED ORDER — INFLUENZA VAC A&B SA ADJ QUAD 0.5 ML IM PRSY: 0.5000 mL | PREFILLED_SYRINGE | INTRAMUSCULAR | Status: DC | PRN

## 2021-05-12 MED ORDER — LIDOCAINE 5 % EX PTCH
1.0000 | MEDICATED_PATCH | Freq: Once | CUTANEOUS | Status: AC
  Administered 2021-05-12: 1 via TRANSDERMAL
  Filled 2021-05-12: qty 1

## 2021-05-12 MED ORDER — SODIUM CHLORIDE 0.9 % IV SOLN: INTRAVENOUS | Status: DC

## 2021-05-12 MED ORDER — PEG 3350-KCL-NA BICARB-NACL 420 G PO SOLR
4000.0000 mL | Freq: Once | ORAL | Status: AC
  Administered 2021-05-12: 4000 mL via ORAL
  Filled 2021-05-12: qty 4000

## 2021-05-12 NOTE — Progress Notes (Signed)
Pt has not finished bowel prep and is having brown stools still. Per Dr. Vicente Males, case will be rescheduled until tomorrow.

## 2021-05-12 NOTE — TOC Progression Note (Signed)
Transition of Care [facility]) - Progression Note    Patient Details  Name: Riley Martin MRN: [id number] Date of Birth: 10-31-1945  Transition of Care [facility]) CM/SW [place], RN Phone Number: 05/12/2021, 9:07 AM  Clinical Narrative:    Transition of Care [facility]) Screening Note   Patient Details  Name: Riley Martin Date of Birth: 1946/02/22   Transition of Care [facility]) CM/SW Contact:    Conception Oms, RN Phone Number: 05/12/2021, 9:07 AM    Transition of Care Department [facility]) has reviewed patient and no TOC needs have been identified at this time. We will continue to monitor patient advancement through interdisciplinary progression rounds. If new patient transition needs arise, please place a TOC consult.           Expected Discharge Plan and Services                                                 Social Determinants of Health (SDOH) Interventions    Readmission Risk Interventions Readmission Risk Prevention Plan 05/11/2021 12/29/2020  Transportation Screening Complete Complete  PCP or Specialist Appt within 3-5 Days Complete -  HRI or [place] Complete Complete  Social Work Consult for [place] Planning/Counseling - Complete  Palliative Care Screening Not Applicable Not Applicable  Medication Review (RN Care Manager) Referral to Pharmacy Complete  Some recent data might be hidden

## 2021-05-12 NOTE — Progress Notes (Signed)
PROGRESS NOTE    Riley Martin  DUK:[id number] DOB: 06-07-1945 DOA: 05/10/2021 PCP: [name], MD   Chief Complaint  Patient presents with   low hemoglobin   Weakness   Cholesterol numbers I think brief Narrative/Hospital Course:  Riley Martin, 76 y.o. 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 In the ED hemoglobin 6.0 g, blood transfusion initiated and patient admitted GI consulted. Xarelto was held and placed on serial H&H   Subjective:  Seen and examined this Bowel movement was not clear despite prep-scope postponed for am On 2l [place] has o2 at home Pain better- took his meds this am, was upset abt not getting his meds last night  Assessment & Plan:  Severe symptomatic anemia Iron deficiency anemia: On presentation 6 g baseline 9.2, 11/17.  He is on Xarelto currently on hold S/P 3 units PRBC-hemoglobin has improved nicely.  S/P iron infusion x1  GI following for  scope tomorrow after prep. Added B12 supplement.cardiology cont ppi bid, avoid nsaid, heparin. Recent Labs  Lab 05/11/21 0813 05/11/21 1430 05/11/21 2244 05/12/21 0148 05/12/21 0805  HGB 7.2* 7.4* 8.7* 9.6* 10.1*  HCT 23.1* 23.8* 27.5* 30.5* 31.4*    Constipation  cont per GI.  BJS:EGBT iv flomax.  GERD:Cont ppi.  COPD:stable, cont bronchodilators and home o2  Chronic pain neck shoulder- hx of back surgery at [facility] home OxyContin and as needed Percocet- taking for 10 yrs. he was very upset about not taking his pain medication last night  HTN: BP is controlled  Type II diabetes mellitus with renal manifestations: Well-controlled,hba1c  normal,monitor. Recent Labs  Lab 05/11/21 0741 05/11/21 1222 05/11/21 1632 05/11/21 2055 05/12/21 0801  GLUCAP 116* 103* 108* 107* 85     Atrial fibrillation, chronic:Holding Xarelto due to anemia continue amiodarone.  Acute renal failure superimposed on stage 3b chronic kidney disease .  Baseline creatinine 1.7 on August/21/22, creatinine improving slowly .continue to hold diuretics suspect multifactorial in the setting of dehydration and severe anemia and also was on Lasix and Aldactone at home.  Keep on gentle IV fluids Recent Labs  Lab 05/10/21 1127 05/11/21 0157 05/12/21 0148  BUN 42* 35* 30*  CREATININE 2.47* 2.29* 1.98*     Depression: mood is stable,continue home Remeron.  DVT prophylaxis: SCDs Start: 05/10/21 1357 Code Status:   Code Status: Full Code Family Communication: plan of care discussed with patient at bedside. Status is: Inpatient Remains inpatient appropriate because: Ongoing management of anemia Disposition: Currently not medically stable for discharge. Anticipated Disposition: home   Objective: Vitals last 24 hrs: Vitals:   05/11/21 1932 05/12/21 0012 05/12/21 0411 05/12/21 0806  BP: (!) 163/69 (!) 126/55 129/62 (!) 143/81  Pulse: 76 69 70 84  Resp: 18 18 18    Temp: 98.6 F (37 C) 97.9 F (36.6 C) 98 F (36.7 C)   TempSrc:      SpO2: 100% 98% 96%   Weight:      Height:       Weight change:   Intake/Output Summary (Last 24 hours) at 05/12/2021 0942 Last data filed at 05/12/2021 0151 Gross per 24 hour  Intake 1377.5 ml  Output  700 ml  Net 677.5 ml    Net IO Since Admission: 2,832.36 mL [05/12/21 0942]   Physical Examination: General exam: AAOx 3,older than stated age, weak appearing. HEENT:Oral mucosa moist, Ear/Nose WNL grossly, dentition normal. Respiratory system: bilaterally clear, no use of accessory muscle Cardiovascular system: S1 & S2 +, No JVD,. Gastrointestinal system: Abdomen soft,NT,ND, BS+ Nervous System:Alert, awake,  moving extremities and grossly nonfocal Extremities: no edema, distal peripheral pulses palpable.  Skin: No rashes,no icterus. MSK: Normal muscle bulk,tone, power   Medications reviewed:  Scheduled Meds:  amiodarone  200 mg Oral QHS   budesonide  1 mg Nebulization Daily   insulin aspart  0-5 Units Subcutaneous QHS   insulin aspart  0-9 Units Subcutaneous TID WC   ipratropium  0.5 mg Nebulization BID   lidocaine  1 patch Transdermal Once   megestrol  625 mg Oral Daily   mirtazapine  15 mg Oral QHS   mometasone-formoterol  2 puff Inhalation BID   multivitamin with minerals  1 tablet Oral Daily   oxyCODONE  10 mg Oral TID   pantoprazole (PROTONIX) IV  40 mg Intravenous Q12H   tamsulosin  0.4 mg Oral Daily   tiotropium  18 mcg Inhalation Daily   vitamin B-12  500 mcg Oral Daily   Continuous Infusions:  sodium chloride 75 mL/hr at 05/11/21 2137   sodium chloride     Diet Order             Diet full liquid Room service appropriate? Yes; Fluid consistency: Thin  Diet effective now                  Weight change:   Wt Readings from Last 3 Encounters:  05/10/21 74.8 kg  03/14/21 68.9 kg  12/23/20 69.2 kg     Consultants:see note  Procedures:see note Antimicrobials: Anti-infectives (From admission, onward)    None      Culture/Microbiology    Component Value Date/Time   SDES SPUTUM 12/25/2020 2117   [place] NONE 12/25/2020 2117   REPTSTATUS 12/26/2020 FINAL 12/25/2020 2117    Other culture-see note  Unresulted Labs (From admission, onward)     Start     Ordered   05/13/21 0500  CBC  Daily,   R      05/12/21 0855   05/12/21 4287  Basic metabolic panel  Daily,   R     Question:  Specimen collection method  Answer:  Lab=Lab collect   05/11/21 1405   05/10/21 1443  Celiac Disease Panel  Add-on,   AD        05/10/21 1442            Data Reviewed: I have personally reviewed following labs and imaging studies CBC: Recent Labs  Lab 05/11/21 0813  05/11/21 1430 05/11/21 2244 05/12/21 0148 05/12/21 0805  WBC 6.4 6.1 6.5 7.6 7.9  HGB 7.2* 7.4* 8.7* 9.6* 10.1*  HCT 23.1* 23.8* 27.5* 30.5* 31.4*  MCV 89.2 88.8 88.7 88.2 88.7  PLT 227 249 220 240 681    Basic Metabolic Panel: Recent Labs  Lab 05/10/21 1127 05/11/21 0157 05/12/21 0148  NA 134* 137 140  K 4.4 4.3 4.7  CL 103 109 107  CO2 23 23 23   GLUCOSE 122* 92 91  BUN 42* 35* 30*  CREATININE 2.47* 2.29* 1.98*  CALCIUM 8.5* 8.2* 8.6*    GFR: Estimated Creatinine Clearance: 33.1 mL/min (A) (by C-G formula based on SCr of 1.98 mg/dL (H)). Liver Function  Tests: No results for input(s): AST, ALT, ALKPHOS, BILITOT, PROT, ALBUMIN in the last 168 hours. No results for input(s): LIPASE, AMYLASE in the last 168 hours. No results for input(s): AMMONIA in the last 168 hours. Coagulation Profile: Recent Labs  Lab 05/10/21 1127  INR 2.4*    Cardiac Enzymes: No results for input(s): CKTOTAL, CKMB, CKMBINDEX, TROPONINI in the last 168 hours. BNP (last 3 results) No results for input(s): PROBNP in the last 8760 hours. HbA1C: Recent Labs    05/10/21 1901  HGBA1C 5.6    CBG: Recent Labs  Lab 05/11/21 0741 05/11/21 1222 05/11/21 1632 05/11/21 2055 05/12/21 0801  GLUCAP 116* 103* 108* 107* 85    Lipid Profile: No results for input(s): CHOL, HDL, LDLCALC, TRIG, CHOLHDL, LDLDIRECT in the last 72 hours. Thyroid Function Tests: No results for input(s): TSH, T4TOTAL, FREET4, T3FREE, THYROIDAB in the last 72 hours. Anemia Panel: Recent Labs    05/10/21 1901  VITAMINB12 281  FOLATE 17.3  FERRITIN 10*  TIBC 385  IRON 135  RETICCTPCT 2.5    Sepsis Labs: No results for input(s): PROCALCITON, LATICACIDVEN in the last 168 hours.  Recent Results (from the past 240 hour(s))  Resp Panel by RT-PCR (Flu A&B, Covid) Nasopharyngeal Swab     Status: None   Collection Time: 05/10/21 12:27 PM   Specimen: Nasopharyngeal Swab; Nasopharyngeal(NP) swabs in vial transport  medium  Result Value Ref Range Status   SARS Coronavirus 2 by RT PCR NEGATIVE NEGATIVE Final    Comment: (NOTE) SARS-CoV-2 target nucleic acids are NOT DETECTED.  The SARS-CoV-2 RNA is generally detectable in upper respiratory specimens during the acute phase of infection. The lowest concentration of SARS-CoV-2 viral copies this assay can detect is 138 copies/mL. A negative result does not preclude SARS-Cov-2 infection and should not be used as the sole basis for treatment or other patient management decisions. A negative result may occur with  improper specimen collection/handling, submission of specimen other than nasopharyngeal swab, presence of viral mutation(s) within the areas targeted by this assay, and inadequate number of viral copies(<138 copies/mL). A negative result must be combined with clinical observations, patient history, and epidemiological information. The expected result is Negative.  Fact Sheet for Patients:  EntrepreneurPulse.com.au  Fact Sheet for Healthcare Providers:  IncredibleEmployment.be  This test is no t yet approved or cleared by the [place] FDA and  has been authorized for detection and/or diagnosis of SARS-CoV-2 by FDA under an Emergency Use Authorization (EUA). This EUA will remain  in effect (meaning this test can be used) for the duration of the COVID-19 declaration under Section 564(b)(1) of the Act, 21 U.S.C.section 360bbb-3(b)(1), unless the authorization is terminated  or revoked sooner.       Influenza A by PCR NEGATIVE NEGATIVE Final   Influenza B by PCR NEGATIVE NEGATIVE Final    Comment: (NOTE) The Xpert Xpress SARS-CoV-2/FLU/RSV plus assay is intended as an aid in the diagnosis of influenza from Nasopharyngeal swab specimens and should not be used as a sole basis for treatment. Nasal washings and aspirates are unacceptable for Xpert Xpress SARS-CoV-2/FLU/RSV testing.  Fact Sheet for  Patients: EntrepreneurPulse.com.au  Fact Sheet for Healthcare Providers: IncredibleEmployment.be  This test is not yet approved or cleared by the [place] FDA and has been authorized for detection and/or diagnosis of SARS-CoV-2 by FDA under an Emergency Use Authorization (EUA). This EUA will remain in effect (meaning this test can be used) for the duration of the COVID-19 declaration under Section  564(b)(1) of the Act, 21 U.S.C. section 360bbb-3(b)(1), unless the authorization is terminated or revoked.  Performed at [facility], [address] [place], [place] [address]       Radiology Studies: No results found.   LOS: 2 days   Antonieta Pert, MD [facility]  05/12/2021, 9:42 AM

## 2021-05-12 NOTE — Progress Notes (Signed)
Jonathon Bellows , MD [address], [place], [place], [place], [address] [address] [place], [place], [place], [address] Phone: [phone]  Fax: [phone]   Riley Martin is being followed for iron deficiency anemia  Day 3 of follow up   Subjective: Did not complete the bowel prep, was having brown stools.  Procedure has been postponed.  No other complaints.   Objective: Vital signs in last 24 hours: Vitals:   05/11/21 1932 05/12/21 0012 05/12/21 0411 05/12/21 0806  BP: (!) 163/69 (!) 126/55 129/62 (!) 143/81  Pulse: 76 69 70 84  Resp: 18 18 18    Temp: 98.6 F (37 C) 97.9 F (36.6 C) 98 F (36.7 C)   TempSrc:      SpO2: 100% 98% 96%   Weight:      Height:       Weight change:   Intake/Output Summary (Last 24 hours) at 05/12/2021 1048 Last data filed at 05/12/2021 1028 Gross per 24 hour  Intake 1737.5 ml  Output 700 ml  Net 1037.5 ml     Exam: Heart:: S1S2 present or without murmur or extra heart sounds Lungs: normal and clear to auscultation Abdomen: soft, nontender, normal bowel sounds   Lab Results: @LABTEST2 @ Micro Results: Recent Results (from the past 240 hour(s))  Resp Panel by RT-PCR (Flu A&B, Covid) Nasopharyngeal Swab     Status: None   Collection Time: 05/10/21 12:27 PM   Specimen: Nasopharyngeal Swab; Nasopharyngeal(NP) swabs in vial transport medium  Result Value Ref Range Status   SARS Coronavirus 2 by RT PCR NEGATIVE NEGATIVE Final    Comment: (NOTE) SARS-CoV-2 target nucleic acids are NOT DETECTED.  The SARS-CoV-2 RNA is generally detectable in upper respiratory specimens during the acute phase of infection. The lowest concentration of SARS-CoV-2 viral copies this assay can detect is 138 copies/mL. A negative result does not preclude SARS-Cov-2 infection and should not be used as the sole basis for treatment or other patient management decisions. A negative result may occur with  improper specimen collection/handling, submission  of specimen other than nasopharyngeal swab, presence of viral mutation(s) within the areas targeted by this assay, and inadequate number of viral copies(<138 copies/mL). A negative result must be combined with clinical observations, patient history, and epidemiological information. The expected result is Negative.  Fact Sheet for Patients:  EntrepreneurPulse.com.au  Fact Sheet for Healthcare Providers:  IncredibleEmployment.be  This test is no t yet approved or cleared by the [place] FDA and  has been authorized for detection and/or diagnosis of SARS-CoV-2 by FDA under an Emergency Use Authorization (EUA). This EUA will remain  in effect (meaning this test can be used) for the duration of the COVID-19 declaration under Section 564(b)(1) of the Act, 21 U.S.C.section 360bbb-3(b)(1), unless the authorization is terminated  or revoked sooner.       Influenza A by PCR NEGATIVE NEGATIVE Final   Influenza B by PCR NEGATIVE NEGATIVE Final    Comment: (NOTE) The Xpert Xpress SARS-CoV-2/FLU/RSV plus assay is intended as an aid in the diagnosis of influenza from Nasopharyngeal swab specimens and should not be used as a sole basis for treatment. Nasal washings and aspirates are unacceptable for Xpert Xpress SARS-CoV-2/FLU/RSV testing.  Fact Sheet for Patients: EntrepreneurPulse.com.au  Fact Sheet for Healthcare Providers: IncredibleEmployment.be  This test is not yet approved or cleared by the [place] FDA and has been authorized for detection and/or diagnosis of SARS-CoV-2 by FDA under an Emergency Use Authorization (EUA). This EUA  will remain in effect (meaning this test can be used) for the duration of the COVID-19 declaration under Section 564(b)(1) of the Act, 21 U.S.C. section 360bbb-3(b)(1), unless the authorization is terminated or revoked.  Performed at [facility], [address], [place], [place] [address]    Studies/Results: No results found. Medications: I have reviewed the patient's current medications. Scheduled Meds:  amiodarone  200 mg Oral QHS   budesonide  1 mg Nebulization Daily   insulin aspart  0-5 Units Subcutaneous QHS   insulin aspart  0-9 Units Subcutaneous TID WC   ipratropium  0.5 mg Nebulization BID   lidocaine  1 patch Transdermal Once   megestrol  625 mg Oral Daily   mirtazapine  15 mg Oral QHS   mometasone-formoterol  2 puff Inhalation BID   multivitamin with minerals  1 tablet Oral Daily   oxyCODONE  10 mg Oral TID   pantoprazole (PROTONIX) IV  40 mg Intravenous Q12H   tamsulosin  0.4 mg Oral Daily   tiotropium  18 mcg Inhalation Daily   vitamin B-12  500 mcg Oral Daily   Continuous Infusions:  sodium chloride 75 mL/hr at 05/11/21 2137   sodium chloride     PRN Meds:.acetaminophen, albuterol, dextromethorphan-guaiFENesin, hydrALAZINE, hyoscyamine, influenza vaccine adjuvanted, meclizine, ondansetron (ZOFRAN) IV, oxyCODONE **AND** oxyCODONE-acetaminophen   Assessment: Principal Problem:   Symptomatic anemia Active Problems:   BPH (benign prostatic hyperplasia)   History of gastroesophageal reflux (GERD)   COPD (chronic obstructive pulmonary disease) (HCC)   Chronic pain   HTN (hypertension)   Type II diabetes mellitus with renal manifestations (HCC)   Atrial fibrillation, chronic ([place])   Acute renal failure superimposed on stage 3b chronic kidney disease ([place])   Depression  Riley Martin is a 76 y.o. y/o male with history of COPD, CKD follows with Dr. [name] at [facility] clinic gastroenterology.  Was found to have iron deficiency anemia in November 2022 CBC at that time demonstrated a macrocytic anemia with an MCV of 101 and hemoglobin of 9.2 g.  Today has been sent from the GI clinic to the ER for an incidental finding of hemoglobin of 6 g.  No overt blood loss.  The patient is on Xarelto.   Iron studies show a very low  ferritin and B12 is borderline and would benefit from supplementation likely has dimorphic anemia. Didn't complete bowel prep today      Plan 1.  Recommend V iron during hospitalization and replace vitamin B12 2.  Transfuse hemoglobin to maintain a hemoglobin above 7 g, Monitor CBC and transfuse as needed 3.  Follow-up celiac serology 4.  EGD+colonoscopy tomorrow if he completes bowel prep- if negative will proceed with capsule study of the small bowel  5.  No role for checking stool occult, It is a test meant only for colon cancer screening and has no role in the inpatient or outpatient evaluation of the anemia   I have discussed alternative options, risks & benefits,  which include, but are not limited to, bleeding, infection, perforation,respiratory complication & drug reaction.  The patient agrees with this plan & written consent will be obtained.     LOS: 2 days   Jonathon Bellows, MD 05/12/2021, 10:48 AM

## 2021-05-12 NOTE — Anesthesia Preprocedure Evaluation (Addendum)
Anesthesia Evaluation  Patient identified by MRN, date of birth, ID band Patient awake    History of Anesthesia Complications (+) PONV  Airway Mallampati: II  TM Distance: >3 FB Neck ROM: Full    Dental no notable dental hx.    Pulmonary COPD, former smoker,    Pulmonary exam normal breath sounds clear to auscultation       Cardiovascular hypertension, Normal cardiovascular exam+ dysrhythmias Atrial Fibrillation  Rhythm:Regular Rate:Normal  EKG  SR  RBBB  PVC  ECHO 2022 EF  60-65%   Neuro/Psych  Headaches,    GI/Hepatic GERD  ,  Endo/Other  diabetes  Renal/GU ARF and Renal InsufficiencyRenal disease     Musculoskeletal   Abdominal   Peds  Hematology  (+) anemia ,   Anesthesia Other Findings   Reproductive/Obstetrics                            Anesthesia Physical Anesthesia Plan  ASA: 3  Anesthesia Plan: General   Post-op Pain Management:    Induction: Intravenous  PONV Risk Score and Plan:   Airway Management Planned: Natural Airway and Nasal Cannula  Additional Equipment:   Intra-op Plan:   Post-operative Plan:   Informed Consent: I have reviewed the patients History and Physical, chart, labs and discussed the procedure including the risks, benefits and alternatives for the proposed anesthesia with the patient or authorized representative who has indicated his/her understanding and acceptance.     Dental Advisory Given  Plan Discussed with: Anesthesiologist, CRNA and Surgeon  Anesthesia Plan Comments: (Patient consented for risks of anesthesia including but not limited to:  - adverse reactions to medications - risk of airway placement if required - damage to eyes, teeth, lips or other oral mucosa - nerve damage due to positioning  - sore throat or hoarseness - Damage to heart, brain, nerves, lungs, other parts of body or loss of life  Patient voiced  understanding.)        Anesthesia Quick Evaluation

## 2021-05-13 ENCOUNTER — Encounter: Payer: Self-pay | Admitting: Internal Medicine

## 2021-05-13 ENCOUNTER — Encounter
Admission: EM | Disposition: A | Discharge status: Home / Self Care | Payer: Self-pay | Source: Home / Self Care | Attending: Internal Medicine

## 2021-05-13 ENCOUNTER — Inpatient Hospital Stay: Payer: BC Managed Care – PPO | Admitting: Anesthesiology

## 2021-05-13 DIAGNOSIS — D649 Anemia, unspecified: Secondary | ICD-10-CM | POA: Diagnosis not present

## 2021-05-13 DIAGNOSIS — K5520 Angiodysplasia of colon without hemorrhage: Secondary | ICD-10-CM | POA: Diagnosis not present

## 2021-05-13 HISTORY — PX: COLONOSCOPY WITH PROPOFOL: SHX5780

## 2021-05-13 HISTORY — PX: ESOPHAGOGASTRODUODENOSCOPY: SHX5428

## 2021-05-13 LAB — BASIC METABOLIC PANEL
Anion gap: 7 (ref 5–15)
BUN: 18 mg/dL (ref 8–23)
CO2: 25 mmol/L (ref 22–32)
Calcium: 8.6 mg/dL — ABNORMAL LOW (ref 8.9–10.3)
Chloride: 109 mmol/L (ref 98–111)
Creatinine, Ser: 2.03 mg/dL — ABNORMAL HIGH (ref 0.61–1.24)
GFR, Estimated: 33 mL/min — ABNORMAL LOW (ref >60)
Glucose, Bld: 90 mg/dL (ref 70–99)
Potassium: 4.2 mmol/L (ref 3.5–5.1)
Sodium: 141 mmol/L (ref 135–145)

## 2021-05-13 LAB — CBC
HCT: 28.4 % — ABNORMAL LOW (ref 39.0–52.0)
Hemoglobin: 9 g/dL — ABNORMAL LOW (ref 13.0–17.0)
MCH: 27.6 pg (ref 26.0–34.0)
MCHC: 31.7 g/dL (ref 30.0–36.0)
MCV: 87.1 fL (ref 80.0–100.0)
Platelets: 228 10*3/uL (ref 150–400)
RBC: 3.26 MIL/uL — ABNORMAL LOW (ref 4.22–5.81)
RDW: 17 % — ABNORMAL HIGH (ref 11.5–15.5)
WBC: 6.7 10*3/uL (ref 4.0–10.5)
nRBC: 0 % (ref 0.0–0.2)

## 2021-05-13 LAB — CELIAC DISEASE PANEL
Endomysial Ab, IgA: NEGATIVE
IgA: 90 mg/dL (ref 61–437)
Tissue Transglutaminase Ab, IgA: <2 U/mL (ref 0–3)

## 2021-05-13 LAB — GLUCOSE, CAPILLARY: Glucose-Capillary: 83 mg/dL (ref 70–99)

## 2021-05-13 SURGERY — COLONOSCOPY WITH PROPOFOL
Anesthesia: General

## 2021-05-13 MED ORDER — PROPOFOL 10 MG/ML IV BOLUS
INTRAVENOUS | Status: DC | PRN
  Administered 2021-05-13: 60 mg via INTRAVENOUS
  Administered 2021-05-13: 20 mg via INTRAVENOUS

## 2021-05-13 MED ORDER — FENTANYL CITRATE PF 50 MCG/ML IJ SOSY
50.0000 ug | PREFILLED_SYRINGE | Freq: Once | INTRAMUSCULAR | Status: AC
  Administered 2021-05-13: 50 ug via INTRAVENOUS

## 2021-05-13 MED ORDER — FERROUS SULFATE 325 (65 FE) MG PO TABS: 325.0000 mg | ORAL_TABLET | Freq: Two times a day (BID) | ORAL | 11 refills | Status: DC

## 2021-05-13 MED ORDER — PROPOFOL 500 MG/50ML IV EMUL
INTRAVENOUS | Status: DC | PRN
  Administered 2021-05-13: 150 ug/kg/min via INTRAVENOUS

## 2021-05-13 MED ORDER — GLYCOPYRROLATE 0.2 MG/ML IJ SOLN
INTRAMUSCULAR | Status: AC
  Filled 2021-05-13: qty 2

## 2021-05-13 MED ORDER — FENTANYL CITRATE PF 50 MCG/ML IJ SOSY
PREFILLED_SYRINGE | INTRAMUSCULAR | Status: AC
  Filled 2021-05-13: qty 1

## 2021-05-13 MED ORDER — PHENYLEPHRINE 40 MCG/ML (10ML) SYRINGE FOR IV PUSH (FOR BLOOD PRESSURE SUPPORT)
PREFILLED_SYRINGE | INTRAVENOUS | Status: DC | PRN
  Administered 2021-05-13 (×2): 80 ug via INTRAVENOUS

## 2021-05-13 MED ORDER — CYANOCOBALAMIN 500 MCG PO TABS: 500.0000 ug | ORAL_TABLET | Freq: Every day | ORAL | 0 refills | Status: AC

## 2021-05-13 MED ORDER — FUROSEMIDE 20 MG PO TABS: 20.0000 mg | ORAL_TABLET | Freq: Two times a day (BID) | ORAL | Status: DC

## 2021-05-13 MED ORDER — GLYCOPYRROLATE 0.2 MG/ML IJ SOLN
INTRAMUSCULAR | Status: DC | PRN
  Administered 2021-05-13: .2 mg via INTRAVENOUS

## 2021-05-13 MED ORDER — PROPOFOL 500 MG/50ML IV EMUL
INTRAVENOUS | Status: AC
  Filled 2021-05-13: qty 50

## 2021-05-13 MED ORDER — RIVAROXABAN 15 MG PO TABS: 15.0000 mg | ORAL_TABLET | Freq: Every day | ORAL | 1 refills | Status: DC

## 2021-05-13 MED ORDER — LIDOCAINE HCL (PF) 2 % IJ SOLN
INTRAMUSCULAR | Status: AC
  Filled 2021-05-13: qty 35

## 2021-05-13 MED ORDER — LIDOCAINE HCL (CARDIAC) PF 100 MG/5ML IV SOSY
PREFILLED_SYRINGE | INTRAVENOUS | Status: DC | PRN
  Administered 2021-05-13: 100 mg via INTRAVENOUS

## 2021-05-13 NOTE — Anesthesia Procedure Notes (Signed)
Procedure Name: MAC Date/Time: 05/13/2021 11:16 AM Performed by: Lily Peer, Celes Dedic, CRNA Pre-anesthesia Checklist: Patient identified, Emergency Drugs available, Timeout performed, Patient being monitored and Suction available Patient Re-evaluated:Patient Re-evaluated prior to induction Oxygen Delivery Method: Simple face mask Induction Type: IV induction

## 2021-05-13 NOTE — Op Note (Signed)
[facility] Gastroenterology Patient Name: Riley Martin Procedure Date: 05/13/2021 10:58 AM MRN: [id number] Account #: [id number] Date of Birth: [birth year] Admit Type: Inpatient Age: 76 Room: [facility] ENDO ROOM 4 Gender: Male Note Status: Finalized Instrument Name: Upper Endoscope [id number] Procedure:             Upper GI endoscopy Indications:           Iron deficiency anemia Providers:             Jonathon Bellows MD, MD Referring MD:          [name] (Referring MD) Medicines:             Monitored Anesthesia Care Complications:         No immediate complications. Procedure:             Pre-Anesthesia Assessment:                        - Prior to the procedure, a History and Physical was                         performed, and patient medications, allergies and                         sensitivities were reviewed. The patient's tolerance                         of previous anesthesia was reviewed.                        - The risks and benefits of the procedure and the                         sedation options and risks were discussed with the                         patient. All questions were answered and informed                         consent was obtained.                        - ASA Grade Assessment: III - A patient with severe                         systemic disease.                        After obtaining informed consent, the endoscope was                         passed under direct vision. Throughout the procedure,                         the patient's blood pressure, pulse, and oxygen                         saturations were monitored continuously. The Endoscope                         was introduced  through the mouth, and advanced to the                         third part of duodenum. The upper GI endoscopy was                         accomplished with ease. The patient tolerated the                         procedure well. Findings:      The esophagus was  normal.      The stomach was normal.      The examined duodenum was normal. Biopsies were taken with a cold       forceps for histology.      The exam was otherwise without abnormality. Impression:            - Normal esophagus.                        - Normal stomach.                        - Normal examined duodenum. Biopsied.                        - The examination was otherwise normal. Recommendation:        - Await pathology results.                        - Perform a colonoscopy today. Procedure Code(s):     --- Professional ---                        [phone], Esophagogastroduodenoscopy, flexible,                         transoral; with biopsy, single or multiple Diagnosis Code(s):     --- Professional ---                        D50.9, Iron deficiency anemia, unspecified CPT copyright 2019 American Medical Association. All rights reserved. The codes documented in this report are preliminary and upon coder review may  be revised to meet current compliance requirements. Jonathon Bellows, MD Jonathon Bellows MD, MD 05/13/2021 11:16:56 AM This report has been signed electronically. Number of Addenda: 0 Note Initiated On: 05/13/2021 10:58 AM Estimated Blood Loss:  Estimated blood loss: none.      [facility]

## 2021-05-13 NOTE — Anesthesia Postprocedure Evaluation (Signed)
Anesthesia Post Note  Patient: Riley Martin  Procedure(s) Performed: COLONOSCOPY WITH PROPOFOL ESOPHAGOGASTRODUODENOSCOPY (EGD)  Anesthesia Type: General Anesthetic complications: no   There were no known notable events for this encounter.   Last Vitals:  Vitals:   05/13/21 1028 05/13/21 1140  BP: (!) 130/53 109/70  Pulse: 72 95  Resp: 20 18  Temp: (!) 36.3 C (!) 35.8 C  SpO2: 100% 99%    Last Pain:  Vitals:   05/13/21 1140  TempSrc: Temporal  PainSc: Asleep                 [place]as

## 2021-05-13 NOTE — Plan of Care (Signed)

## 2021-05-13 NOTE — Transfer of Care (Signed)
Immediate Anesthesia Transfer of Care Note  Patient: Riley Martin  Procedure(s) Performed: COLONOSCOPY WITH PROPOFOL ESOPHAGOGASTRODUODENOSCOPY (EGD) GIVENS CAPSULE STUDY  Patient Location: Endoscopy Unit  Anesthesia Type:General  Level of Consciousness: sedated  Airway & Oxygen Therapy: Patient Spontanous Breathing  Post-op Assessment: Report given to RN and Post -op Vital signs reviewed and stable  Post vital signs: Reviewed and stable  Last Vitals:  Vitals Value Taken Time  BP 109/70 05/13/21 1141  Temp    Pulse 95 05/13/21 1141  Resp 17 05/13/21 1141  SpO2 99 % 05/13/21 1141  Vitals shown include unvalidated device data.  Last Pain:  Vitals:   05/13/21 1028  TempSrc: Temporal  PainSc: 0-No pain         Complications: No notable events documented.

## 2021-05-13 NOTE — Op Note (Signed)
[facility] Patient Name: Riley Martin Procedure Date: 05/13/2021 10:57 AM MRN: [id number] Account #: [id number] Date of Birth: [birth year] Admit Type: Inpatient Age: 76 Room: [facility] ENDO ROOM 4 Gender: Male Note Status: Finalized Instrument Name: Park Meo [id number] Procedure:             Colonoscopy Indications:           Iron deficiency anemia Providers:             Jonathon Bellows MD, MD Referring MD:          [name] (Referring MD) Medicines:             Monitored Anesthesia Care Complications:         No immediate complications. Procedure:             Pre-Anesthesia Assessment:                        - Prior to the procedure, a History and Physical was                         performed, and patient medications, allergies and                         sensitivities were reviewed. The patient's tolerance                         of previous anesthesia was reviewed.                        - The risks and benefits of the procedure and the                         sedation options and risks were discussed with the                         patient. All questions were answered and informed                         consent was obtained.                        - ASA Grade Assessment: III - A patient with severe                         systemic disease.                        After obtaining informed consent, the colonoscope was                         passed under direct vision. Throughout the procedure,                         the patient's blood pressure, pulse, and oxygen                         saturations were monitored continuously. The                         Colonoscope was introduced through the anus  and                         advanced to the the cecum, identified by the                         appendiceal orifice. The colonoscopy was performed                         with ease. The patient tolerated the procedure well.                         The  quality of the bowel preparation was fair. Findings:      The perianal and digital rectal examinations were normal.      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 Coagulation for bleeding prevention using argon plasma at 0.5       liters/minute and 45 watts was successful.      The exam was otherwise without abnormality.      Multiple small-mouthed diverticula were found in the sigmoid colon. Impression:            - Preparation of the colon was fair.                        - A single non-bleeding colonic angioectasia. Treated                         with argon plasma coagulation (APC).                        - The examination was otherwise normal.                        - No specimens collected. Recommendation:        - Return patient to hospital [facility] for ongoing care.                        - Advance diet as tolerated.                        - Follow up with Dr [name] as outpatient - if has                         further drop in Hb suggest capsulke study of the small                         bowel and consider repeating colonoscopy as prep was                         poor                        IV iron and b12 replacement                        Hold blood thinners for 3 more days and restart if  possible and monitor Hb closely as outpatient                        GI will sign out Procedure Code(s):     --- Professional ---                        [phone], Colonoscopy, flexible; with control of                         bleeding, any method Diagnosis Code(s):     --- Professional ---                        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 All rights reserved. The codes documented in this report are preliminary and upon coder review may  be revised to meet current compliance requirements. Jonathon Bellows, MD Jonathon Bellows MD, MD 05/13/2021 11:40:46 AM This report has been signed electronically. Number of Addenda: 0 Note Initiated On: 05/13/2021 10:57 AM Scope Withdrawal Time: 0 hours 9 minutes 47 seconds  Total Procedure Duration: 0 hours 18 minutes 16 seconds  Estimated Blood Loss:  Estimated blood loss: none.      [facility]

## 2021-05-13 NOTE — Discharge Summary (Signed)
Physician Discharge Summary  Riley Martin KJZ:[id number] DOB: 10/09/1945 DOA: 05/10/2021  PCP: [facility]  Admit date: 05/10/2021 Discharge date: 05/13/2021  Admitted From: home Disposition:  home  Recommendations for Outpatient Follow-up:  Follow up with PCP next wk Please obtain BMP/CBC early next week  Home Health:no  Equipment/Devices: none  Discharge Condition: Stable Code Status:   Code Status: Full Code Diet recommendation:  Diet Order             Diet heart healthy/carb modified Room service appropriate? Yes; Fluid consistency: Thin  Diet effective now                    Brief/Interim Summary:  76 y.o. 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 In the ED hemoglobin 6.0 g, blood transfusion initiated and patient admitted GI consulted. 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  He is on Xarelto currently on hold S/P 3 units PRBC.  Hemoglobin has improved.  He S/P IV iron.  Underwent EGD colonoscopy -EGD unremarkable colonoscopy with a large abscess that was cauterized, discussed with GI okay to discharge home advised to hold Xarelto for 3 more days check CBC early next week from primary care doctor. COLONOSCOPY: Preparation of the colon was fair.                        - A single non-bleeding colonic angioectasia. Treated                         with argon plasma coagulation (APC).                        - The examination was otherwise normal.                         - No specimens collected. Recommendation:-Return patient to hospital [facility] for ongoing care.                        - Advance diet as tolerated.                        - Follow up with [facility] as outpatient - if has                         further drop in Hb suggest capsulke study of the small                         bowel and consider repeating colonoscopy as prep was                         poor  IV iron and b12 replacement                        Hold blood thinners for 3 more days and restart if                         possible and monitor Hb closely as outpatient  Constipation  cont per GI.   SWV:TVNR iv flomax.   GERD:Cont ppi.   COPD:stable, cont bronchodilators and home o2   Chronic pain neck shoulder- hx of back [facility] at [facility] continue home med cont home OxyContin and as needed Percocet- taking for 10 yrs. he was very upset about not taking his pain medication last night   HTN: BP is controlled   Type II diabetes mellitus with renal manifestations: Well-controlled,hba1c normal,monitor  Atrial fibrillation, chronic:Holding Xarelto due to anemia continue amiodarone.   Acute renal failure superimposed on stage 3b chronic kidney disease: Baseline creatinine around 1.7 -2, creatinine downtrending today at 2 ON ADMIT 2.47, advised to hold Lasix encourage oral hydration check BMP early next week before resuming her Lasix. Recent Labs  Lab 05/10/21 1127 05/11/21 0157 05/12/21 0148 05/13/21 0716  BUN 42* 35* 30* 18  CREATININE 2.47* 2.29* 1.98* 2.03*    Consults: GI  Subjective: Seen this morning awaiting for endoscopy.  Discussed after procedure he feels fine did well with oral intake, nursing reports that he has been wanting to go home.   Discharge Exam: Vitals:   05/13/21 1239 05/13/21 1251  BP: 135/78   Pulse: 80 78  Resp:    Temp:    SpO2: 98% 96%   General: Pt is alert, awake, not in acute distress Cardiovascular: RRR, S1/S2 +,  no rubs, no gallops Respiratory: CTA bilaterally, no wheezing, no rhonchi Abdominal: Soft, NT, ND, bowel sounds + Extremities: no edema, no cyanosis  Discharge Instructions  Discharge Instructions     (HEART FAILURE PATIENTS) Call Martin:  Anytime you have any of the following symptoms: 1) 3 pound weight gain in 24 hours or 5 pounds in 1 week 2) shortness of breath, with or without a dry hacking cough 3) swelling in the hands, feet or stomach 4) if you have to sleep on extra pillows at night in order to breathe.   Complete by: As directed    Discharge instructions   Complete by: As directed    Check CBC BMP early next week from your primary care doctor Hold your Xarelto for 3 more days and holder Lasix until you check your kidney function  Please call call Martin or return to ER for similar or worsening recurring problem that brought you to hospital or if any fever,nausea/vomiting,abdominal pain, uncontrolled pain, chest pain,  shortness of breath or any other alarming symptoms.  Please follow-up your doctor as instructed in a week time and call the office for appointment.  Please avoid alcohol, smoking, or any other illicit substance and maintain healthy habits including taking your regular medications as prescribed.  You were cared for by a hospitalist during your hospital stay. If you have any questions about your discharge medications or the care you received while you were in the hospital after you are discharged, you can call the unit and ask to speak with the hospitalist on call if the hospitalist that took care of you is not available.  Once you are discharged, your primary care physician will handle any further medical issues. Please note  that NO REFILLS for any discharge medications will be authorized once you are discharged, as it is imperative that you return to your primary care physician (or establish a relationship with a primary care physician if you do not have one) for your  aftercare needs so that they can reassess your need for medications and monitor your lab values   Increase activity slowly   Complete by: As directed       Allergies as of 05/13/2021       Reactions   Iodinated Contrast Media Other (See Comments), Nausea Only   Dry heaves, patient states he felt like he was on fire and about to pass out.  Pre-syncope, burning  Dry heaves, patient states he felt like he was on fire and about to pass out.    Lisinopril Swelling   Tongue swelling, neck pain and Headaches    Tramadol Other (See Comments), Shortness Of Breath   Other reaction(s): Other (See Comments) Other Reaction: tachy Dizziness and unsteady gait.   Pregabalin Palpitations   Other reaction(s): Other (See Comments) Other Reaction: edema        Medication List     STOP taking these medications    insulin lispro 100 UNIT/ML KwikPen Commonly known as: HumaLOG KwikPen   metoprolol tartrate 50 MG tablet Commonly known as: LOPRESSOR   oxyCODONE-acetaminophen 10-325 MG tablet Commonly known as: PERCOCET   pilocarpine 5 MG tablet Commonly known as: SALAGEN       TAKE these medications    amiodarone 200 MG tablet Commonly known as: PACERONE Take 1 tablet (200 mg total) by mouth 2 (two) times daily. What changed: when to take this   blood glucose meter kit and supplies Kit Dispense based on patient and insurance preference. Use up to four times daily as directed.   budesonide 1 MG/2ML nebulizer solution Commonly known as: PULMICORT Take 1 mg by nebulization daily.   feeding supplement (NEPRO CARB STEADY) Liqd Take 237 mLs by mouth 3 (three) times daily between meals.   ferrous sulfate 325 (65 FE) MG tablet Take 1 tablet (325 mg total) by mouth 2 (two) times daily with a meal.   fluticasone 50 MCG/ACT nasal spray Commonly known as: FLONASE SPRAY 2 SPRAYS INTO EACH NOSTRIL EVERY DAY   furosemide 20 MG tablet Commonly known as: LASIX Take 1 tablet (20 mg total)  by mouth 2 (two) times daily. Resume it after checking kidney function and after discussing with primary care doctor Start taking on: May 18, 2021 What changed:  additional instructions These instructions start on May 18, 2021. If you are unsure what to do until then, ask your doctor or other care provider.   hyoscyamine 0.125 MG SL tablet Commonly known as: LEVSIN SL Place 0.125 mg under the tongue every 4 (four) hours as needed.   ipratropium 0.02 % nebulizer solution Commonly known as: ATROVENT Take 0.5 mg by nebulization 2 (two) times daily.   levalbuterol 45 MCG/ACT inhaler Commonly known as: XOPENEX HFA Inhale 2 puffs into the lungs every 4 (four) hours as needed for wheezing.   meclizine 12.5 MG tablet Commonly known as: ANTIVERT Take 12.5 mg by mouth 3 (three) times daily as needed for dizziness.   megestrol 625 MG/5ML suspension Commonly known as: MEGACE ES Take 625 mg by mouth daily.   mirtazapine 15 MG tablet Commonly known as: REMERON Take 15 mg by mouth at bedtime.   mometasone-formoterol 200-5 MCG/ACT Aero Commonly known as: DULERA Inhale 2 puffs into the  lungs 2 (two) times daily.   multivitamin with minerals Tabs tablet Take 1 tablet by mouth daily.   ondansetron 4 MG disintegrating tablet Commonly known as: ZOFRAN-ODT Take 4 mg by mouth every 8 (eight) hours as needed for nausea or vomiting.   OxyCONTIN 10 mg 12 hr tablet Generic drug: oxyCODONE Take 10 mg by mouth 3 (three) times daily.   pantoprazole 20 MG tablet Commonly known as: PROTONIX Take 20 mg by mouth daily.   Pen Needles 31G X 6 MM Misc 1 each by Does not apply route 3 (three) times daily with meals.   Rivaroxaban 15 MG Tabs tablet Commonly known as: XARELTO Take 1 tablet (15 mg total) by mouth daily with supper. Hold for 3 days Start taking on: May 16, 2021 What changed:  additional instructions These instructions start on May 16, 2021. If you are unsure what to do  until then, ask your doctor or other care provider.   sodium chloride 0.65 % Soln nasal spray Commonly known as: OCEAN Place 1 spray into both nostrils as needed for congestion.   spironolactone 25 MG tablet Commonly known as: ALDACTONE Take 25 mg by mouth daily.   tamsulosin 0.4 MG Caps capsule Commonly known as: FLOMAX Take 0.4 mg by mouth daily.   tiotropium 18 MCG inhalation capsule Commonly known as: SPIRIVA Place 1 capsule (18 mcg total) into inhaler and inhale daily.   Ventolin HFA 108 (90 Base) MCG/ACT inhaler Generic drug: albuterol Inhale 2 puffs into the lungs every 6 (six) hours as needed.   albuterol (2.5 MG/3ML) 0.083% nebulizer solution Commonly known as: PROVENTIL Take 3 mLs by nebulization every 6 (six) hours as needed for wheezing or shortness of breath.   vitamin B-12 500 MCG tablet Commonly known as: CYANOCOBALAMIN Take 1 tablet (500 mcg total) by mouth daily. Start taking on: May 14, 2021        Follow-up Information     [facility] Follow up in 3 day(s).   Specialty: Family Medicine Why: To check your kidney function and hemoglobin early next week Contact information: [place] [address] [phone]                Allergies  Allergen Reactions   Iodinated Contrast Media Other (See Comments) and Nausea Only    Dry heaves, patient states he felt like he was on fire and about to pass out.  Pre-syncope, burning  Dry heaves, patient states he felt like he was on fire and about to pass out.    Lisinopril Swelling    Tongue swelling, neck pain and Headaches    Tramadol Other (See Comments) and Shortness Of Breath    Other reaction(s): Other (See Comments) Other Reaction: tachy Dizziness and unsteady gait.   Pregabalin Palpitations    Other reaction(s): Other (See Comments) Other Reaction: edema    The results of significant diagnostics from this hospitalization (including  imaging, microbiology, ancillary and laboratory) are listed below for reference.    Microbiology: Recent Results (from the past 240 hour(s))  Resp Panel by RT-PCR (Flu A&B, Covid) Nasopharyngeal Swab     Status: None   Collection Time: 05/10/21 12:27 PM   Specimen: Nasopharyngeal Swab; Nasopharyngeal(NP) swabs in vial transport medium  Result Value Ref Range Status   SARS Coronavirus 2 by RT PCR NEGATIVE NEGATIVE Final    Comment: (NOTE) SARS-CoV-2 target nucleic acids are NOT DETECTED.  The SARS-CoV-2 RNA is generally detectable in upper  respiratory specimens during the acute phase of infection. The lowest concentration of SARS-CoV-2 viral copies this assay can detect is 138 copies/mL. A negative result does not preclude SARS-Cov-2 infection and should not be used as the sole basis for treatment or other patient management decisions. A negative result may occur with  improper specimen collection/handling, submission of specimen other than nasopharyngeal swab, presence of viral mutation(s) within the areas targeted by this assay, and inadequate number of viral copies(<138 copies/mL). A negative result must be combined with clinical observations, patient history, and epidemiological information. The expected result is Negative.  Fact Sheet for Patients:  EntrepreneurPulse.com.au  Fact Sheet for Healthcare Providers:  IncredibleEmployment.be  This test is no t yet approved or cleared by the [place] FDA and  has been authorized for detection and/or diagnosis of SARS-CoV-2 by FDA under an Emergency Use Authorization (EUA). This EUA will remain  in effect (meaning this test can be used) for the duration of the COVID-19 declaration under Section 564(b)(1) of the Act, 21 U.S.C.section 360bbb-3(b)(1), unless the authorization is terminated  or revoked sooner.       Influenza A by PCR NEGATIVE NEGATIVE Final   Influenza B by PCR NEGATIVE  NEGATIVE Final    Comment: (NOTE) The Xpert Xpress SARS-CoV-2/FLU/RSV plus assay is intended as an aid in the diagnosis of influenza from Nasopharyngeal swab specimens and should not be used as a sole basis for treatment. Nasal washings and aspirates are unacceptable for Xpert Xpress SARS-CoV-2/FLU/RSV testing.  Fact Sheet for Patients: EntrepreneurPulse.com.au  Fact Sheet for Healthcare Providers: IncredibleEmployment.be  This test is not yet approved or cleared by the [place] FDA and has been authorized for detection and/or diagnosis of SARS-CoV-2 by FDA under an Emergency Use Authorization (EUA). This EUA will remain in effect (meaning this test can be used) for the duration of the COVID-19 declaration under Section 564(b)(1) of the Act, 21 U.S.C. section 360bbb-3(b)(1), unless the authorization is terminated or revoked.  Performed at [facility], [address] [place], [place] [address]     Procedures/Studies: CT CHEST WO CONTRAST  Result Date: 04/20/2021 CLINICAL DATA:  Shortness of breath, chronic cough, history of Aspergillus infection. EXAM: CT CHEST WITHOUT CONTRAST TECHNIQUE: Multidetector CT imaging of the chest was performed following the standard protocol without IV contrast. COMPARISON:  06/21/2020. FINDINGS: Cardiovascular: Atherosclerotic calcification of the aorta, aortic valve and coronary arteries. Pulmonic trunk is enlarged. Heart size normal. No pericardial effusion. Mediastinum/Nodes: No pathologically enlarged mediastinal or axillary lymph nodes. Hilar regions are difficult to definitively evaluate without IV contrast. Esophagus is grossly unremarkable. Lungs/Pleura: Biapical pleuroparenchymal scarring. Centrilobular and paraseptal emphysema. New basilar predominant patchy ground-glass, bronchiectasis and mild architectural distortion. Calcified granuloma in the left upper lobe. Linear scarring in the lingula.  8 mm nodule in the superior segment left lower lobe (3/76), new from 06/21/2020. No pleural fluid. Debris is seen in the airway. Upper Abdomen: Visualized portions of the liver, gallbladder and adrenal glands are unremarkable. Punctate stone in the right kidney. Visualized portions of the kidneys, spleen, pancreas, stomach and bowel are otherwise unremarkable. No upper abdominal adenopathy. Musculoskeletal: Degenerative changes in the spine. No worrisome lytic or sclerotic lesions. IMPRESSION: 1. New left lower lobe nodule may be infectious in etiology, given the patient's history of Aspergillus infection. Malignancy can also have this appearance. Follow-up CT chest in 3 months could be performed, as clinically indicated. If a more aggressive approach is desired, PET could be considered but lesion is borderline in  size for PET resolution. These results will be called to the ordering clinician or representative by the Radiologist Assistant, and communication documented in the PACS or [employer]. 2. New basilar predominant patchy ground-glass, bronchiectasis and mild architectural distortion, findings which may represent the sequelae of COVID-19 pneumonia. 3. Punctate right renal stone. 4. Aortic atherosclerosis (ICD10-I70.0). Coronary artery calcification. 5. Enlarged pulmonic trunk, indicative of pulmonary arterial hypertension. 6.  Emphysema (ICD10-J43.9). Electronically Signed   By: Lorin Picket M.D.   On: 04/20/2021 11:38    Labs: BNP (last 3 results) Recent Labs    12/28/20 0557 12/29/20 0541 05/10/21 1127  BNP 175.8* 160.1* 90.3   Basic Metabolic Panel: Recent Labs  Lab 05/10/21 1127 05/11/21 0157 05/12/21 0148 05/13/21 0716  NA 134* 137 140 141  K 4.4 4.3 4.7 4.2  CL 103 109 107 109  CO2 $Re'23 23 23 25  'KSi$ GLUCOSE 122* 92 91 90  BUN 42* 35* 30* 18  CREATININE 2.47* 2.29* 1.98* 2.03*  CALCIUM 8.5* 8.2* 8.6* 8.6*   Liver Function Tests: No results for input(s): AST, ALT, ALKPHOS,  BILITOT, PROT, ALBUMIN in the last 168 hours. No results for input(s): LIPASE, AMYLASE in the last 168 hours. No results for input(s): AMMONIA in the last 168 hours. CBC: Recent Labs  Lab 05/11/21 1430 05/11/21 2244 05/12/21 0148 05/12/21 0805 05/13/21 0716  WBC 6.1 6.5 7.6 7.9 6.7  HGB 7.4* 8.7* 9.6* 10.1* 9.0*  HCT 23.8* 27.5* 30.5* 31.4* 28.4*  MCV 88.8 88.7 88.2 88.7 87.1  PLT 249 220 240 273 228   Cardiac Enzymes: No results for input(s): CKTOTAL, CKMB, CKMBINDEX, TROPONINI in the last 168 hours. BNP: Invalid input(s): POCBNP CBG: Recent Labs  Lab 05/11/21 2055 05/12/21 0801 05/12/21 1129 05/12/21 2222 05/13/21 0740  GLUCAP 107* 85 140* 101* 83   D-Dimer No results for input(s): DDIMER in the last 72 hours. Hgb A1c Recent Labs    05/10/21 1901  HGBA1C 5.6   Lipid Profile No results for input(s): CHOL, HDL, LDLCALC, TRIG, CHOLHDL, LDLDIRECT in the last 72 hours. Thyroid function studies No results for input(s): TSH, T4TOTAL, T3FREE, THYROIDAB in the last 72 hours.  Invalid input(s): FREET3 Anemia work up Recent Labs    05/10/21 1901  VITAMINB12 281  FOLATE 17.3  FERRITIN 10*  TIBC 385  IRON 135  RETICCTPCT 2.5   Urinalysis    Component Value Date/Time   COLORURINE STRAW (A) 05/10/2021 1227   APPEARANCEUR CLEAR (A) 05/10/2021 1227   LABSPEC 1.009 05/10/2021 1227   PHURINE 5.0 05/10/2021 1227   GLUCOSEU NEGATIVE 05/10/2021 1227   HGBUR NEGATIVE 05/10/2021 1227   [place] 05/10/2021 1227   KETONESUR NEGATIVE 05/10/2021 1227   PROTEINUR NEGATIVE 05/10/2021 1227   NITRITE NEGATIVE 05/10/2021 1227   LEUKOCYTESUR NEGATIVE 05/10/2021 1227   Sepsis Labs Invalid input(s): PROCALCITONIN,  WBC,  LACTICIDVEN Microbiology Recent Results (from the past 240 hour(s))  Resp Panel by RT-PCR (Flu A&B, Covid) Nasopharyngeal Swab     Status: None   Collection Time: 05/10/21 12:27 PM   Specimen: Nasopharyngeal Swab; Nasopharyngeal(NP) swabs in  vial transport medium  Result Value Ref Range Status   SARS Coronavirus 2 by RT PCR NEGATIVE NEGATIVE Final    Comment: (NOTE) SARS-CoV-2 target nucleic acids are NOT DETECTED.  The SARS-CoV-2 RNA is generally detectable in upper respiratory specimens during the acute phase of infection. The lowest concentration of SARS-CoV-2 viral copies this assay can detect is 138 copies/mL. A negative result does not preclude  SARS-Cov-2 infection and should not be used as the sole basis for treatment or other patient management decisions. A negative result may occur with  improper specimen collection/handling, submission of specimen other than nasopharyngeal swab, presence of viral mutation(s) within the areas targeted by this assay, and inadequate number of viral copies(<138 copies/mL). A negative result must be combined with clinical observations, patient history, and epidemiological information. The expected result is Negative.  Fact Sheet for Patients:  EntrepreneurPulse.com.au  Fact Sheet for Healthcare Providers:  IncredibleEmployment.be  This test is no t yet approved or cleared by the [place] FDA and  has been authorized for detection and/or diagnosis of SARS-CoV-2 by FDA under an Emergency Use Authorization (EUA). This EUA will remain  in effect (meaning this test can be used) for the duration of the COVID-19 declaration under Section 564(b)(1) of the Act, 21 U.S.C.section 360bbb-3(b)(1), unless the authorization is terminated  or revoked sooner.       Influenza A by PCR NEGATIVE NEGATIVE Final   Influenza B by PCR NEGATIVE NEGATIVE Final    Comment: (NOTE) The Xpert Xpress SARS-CoV-2/FLU/RSV plus assay is intended as an aid in the diagnosis of influenza from Nasopharyngeal swab specimens and should not be used as a sole basis for treatment. Nasal washings and aspirates are unacceptable for Xpert Xpress SARS-CoV-2/FLU/RSV testing.  Fact  Sheet for Patients: EntrepreneurPulse.com.au  Fact Sheet for Healthcare Providers: IncredibleEmployment.be  This test is not yet approved or cleared by the [place] FDA and has been authorized for detection and/or diagnosis of SARS-CoV-2 by FDA under an Emergency Use Authorization (EUA). This EUA will remain in effect (meaning this test can be used) for the duration of the COVID-19 declaration under Section 564(b)(1) of the Act, 21 U.S.C. section 360bbb-3(b)(1), unless the authorization is terminated or revoked.  Performed at [facility], [address], [place], [place] [address]   Time coordinating discharge: 35 minutes  SIGNED: Antonieta Pert, Martin  [facility] 05/13/2021, 3:08 PM  If 7PM-7AM, please contact night-coverage [email or web address]

## 2021-05-13 NOTE — H&P (Signed)
Jonathon Bellows, MD [address], [place], [place], [place], [address] [address] [place], [place], [place], [address] Phone: [phone]  Fax: [phone]  Primary Care Physician:  [name], MD   Pre-Procedure History & Physical: HPI:  Riley Martin Kramar is a 76 y.o. male is here for an endoscopy and colonoscopy    Past Medical History:  Diagnosis Date   Arthritis    BPH (benign prostatic hyperplasia)    Cancer (HCC)    skin cancer   Chronic pain    COPD (chronic obstructive pulmonary disease) ([place])    COVID    GERD (gastroesophageal reflux disease)    Headache    History of insomnia    IBS (irritable bowel syndrome)    Orthostatic dizziness    PONV (postoperative nausea and vomiting)    Spinal stenosis    Torn rotator cuff    left    Past Surgical History:  Procedure Laterality Date   COLONOSCOPY     ESOPHAGOGASTRODUODENOSCOPY N/A 03/14/2021   Procedure: ESOPHAGOGASTRODUODENOSCOPY (EGD);  Surgeon: [name], DO;  Location: [facility] ENDOSCOPY;  Service: Gastroenterology;  Laterality: N/A;   EXCISION CHONCHA BULLOSA Bilateral 06/02/2015   Procedure: BILATERAL CHONCHA BULLOSA;  Surgeon: Carloyn Manner, MD;  Location: [place];  Service: ENT;  Laterality: Bilateral;   HERNIA REPAIR     MAXILLARY ANTROSTOMY Bilateral 06/02/2015   Procedure: MAXILLARY ANTROSTOMY;  Surgeon: Carloyn Manner, MD;  Location: [place];  Service: ENT;  Laterality: Bilateral;   NASAL POLYP SURGERY     NECK SURGERY  05/08/2006   no limitations   UPPER GASTROINTESTINAL ENDOSCOPY      Prior to Admission medications   Medication Sig Start Date End Date Taking? Authorizing Provider  amiodarone (PACERONE) 200 MG tablet Take 1 tablet (200 mg total) by mouth 2 (two) times daily. Patient taking differently: Take 200 mg by mouth daily. 12/29/20  Yes Nicole Kindred A, DO  furosemide (LASIX) 20 MG tablet Take 20 mg by mouth 2 (two) times daily.  05/03/21  Yes [provider]  megestrol (MEGACE ES) 625 MG/5ML suspension Take 625 mg by mouth daily. 08/25/20  Yes [provider]  mometasone-formoterol (DULERA) 200-5 MCG/ACT AERO Inhale 2 puffs into the lungs 2 (two) times daily. 12/29/20  Yes Ezekiel Slocumb, DO  Multiple Vitamin (MULTIVITAMIN WITH MINERALS) TABS tablet Take 1 tablet by mouth daily. 12/30/20  Yes Nicole Kindred A, DO  OXYCONTIN 10 MG 12 hr tablet Take 10 mg by mouth 3 (three) times daily. 12/22/20  Yes [provider]  pantoprazole (PROTONIX) 20 MG tablet Take 20 mg by mouth daily. 06/15/20  Yes [provider]  Rivaroxaban (XARELTO) 15 MG TABS tablet Take 1 tablet (15 mg total) by mouth daily with supper. 12/29/20  Yes Nicole Kindred A, DO  spironolactone (ALDACTONE) 25 MG tablet Take 25 mg by mouth daily. 05/03/21  Yes [provider]  tamsulosin (FLOMAX) 0.4 MG CAPS capsule Take 0.4 mg by mouth daily. 09/03/16  Yes [provider]  tiotropium (SPIRIVA) 18 MCG inhalation capsule Place 1 capsule (18 mcg total) into inhaler and inhale daily. 12/30/20  Yes Nicole Kindred A, DO  albuterol (PROVENTIL) (2.5 MG/3ML) 0.083% nebulizer solution Take 3 mLs by nebulization every 6 (six) hours as needed for wheezing or shortness of breath. 12/13/20   [provider]  blood glucose meter kit and supplies KIT Dispense based on patient and insurance preference. Use up to four times daily  as directed. 12/29/20   Nicole Kindred A, DO  budesonide (PULMICORT) 1 MG/2ML nebulizer solution Take 1 mg by nebulization daily.    [provider]  fluticasone (FLONASE) 50 MCG/ACT nasal spray SPRAY 2 SPRAYS INTO EACH NOSTRIL EVERY DAY 02/07/17   Laverle Hobby, MD  hyoscyamine (LEVSIN SL) 0.125 MG SL tablet Place 0.125 mg under the tongue every 4 (four) hours as needed.    [provider]  insulin lispro (HUMALOG KWIKPEN) 100 UNIT/ML KwikPen Inject 0-9 Units into the skin 3  (three) times daily with meals. Per Sliding Scale Instructions provided Patient not taking: Reported on 05/10/2021 12/29/20   Nicole Kindred A, DO  Insulin Pen Needle (PEN NEEDLES) 31G X 6 MM MISC 1 each by Does not apply route 3 (three) times daily with meals. 12/29/20   Nicole Kindred A, DO  ipratropium (ATROVENT) 0.02 % nebulizer solution Take 0.5 mg by nebulization 2 (two) times daily.    [provider]  levalbuterol Penne Lash HFA) 45 MCG/ACT inhaler Inhale 2 puffs into the lungs every 4 (four) hours as needed for wheezing.    [provider]  meclizine (ANTIVERT) 12.5 MG tablet Take 12.5 mg by mouth 3 (three) times daily as needed for dizziness. 04/18/21   [provider]  metoprolol tartrate (LOPRESSOR) 50 MG tablet Take 1 tablet (50 mg total) by mouth 2 (two) times daily. Patient not taking: Reported on 05/10/2021 12/29/20   Nicole Kindred A, DO  mirtazapine (REMERON) 15 MG tablet Take 15 mg by mouth at bedtime. 12/14/20   [provider]  Nutritional Supplements (FEEDING SUPPLEMENT, NEPRO CARB STEADY,) LIQD Take 237 mLs by mouth 3 (three) times daily between meals. 12/29/20   Nicole Kindred A, DO  ondansetron (ZOFRAN-ODT) 4 MG disintegrating tablet Take 4 mg by mouth every 8 (eight) hours as needed for nausea or vomiting.    [provider]  oxyCODONE-acetaminophen (PERCOCET) 10-325 MG tablet Take 1 tablet by mouth 5 (five) times daily as needed. Patient not taking: Reported on 05/10/2021 05/23/20   [provider]  pilocarpine (SALAGEN) 5 MG tablet Take 2.5 mg by mouth 3 (three) times daily. Patient not taking: Reported on 05/10/2021 05/03/21   [provider]  sodium chloride (OCEAN) 0.65 % SOLN nasal spray Place 1 spray into both nostrils as needed for congestion. 12/29/20   Ezekiel Slocumb, DO  VENTOLIN HFA 108 (90 Base) MCG/ACT inhaler Inhale 2 puffs into the lungs every 6 (six) hours as needed. 08/07/16   [provider]     Allergies as of 05/10/2021 - Review Complete 05/10/2021  Allergen Reaction Noted   Iodinated contrast media Other (See Comments) and Nausea Only 10/14/2014   Lisinopril Swelling 07/12/2020   Tramadol Other (See Comments) and Shortness Of Breath 10/14/2014   Pregabalin Palpitations     History reviewed. No pertinent family history.  Social History   Socioeconomic History   Marital status: Married    Spouse name: Not on file   Number of children: Not on file   Years of education: Not on file   Highest education level: Not on file  Occupational History   Not on file  Tobacco Use   Smoking status: Former    Packs/day: 1.00    Years: 57.00    Pack years: 57.00    Types: Cigarettes    Quit date: 2020    Years since quitting: 3.0   Smokeless tobacco: Never  Vaping Use   Vaping Use: Never used  Substance and Sexual Activity   Alcohol use: No   Drug use: No   Sexual activity: Not on file  Other Topics Concern   Not on file  Social History Narrative   Not on file   Social Determinants of Health   Financial Resource Strain: Not on file  Food Insecurity: Not on file  Transportation Needs: Not on file  Physical Activity: Not on file  Stress: Not on file  Social Connections: Not on file  Intimate Partner Violence: Not on file    Review of Systems: See HPI, otherwise negative ROS  Physical Exam: BP (!) 130/53    Pulse 72    Temp (!) 97.4 F (36.3 C) (Temporal)    Resp 20    Ht 6' (1.829 m)    Wt 74.8 kg    SpO2 100%    BMI 22.37 kg/m  General:   Alert,  pleasant and cooperative in NAD Head:  Normocephalic and atraumatic. Neck:  Supple; no masses or thyromegaly. Lungs:  Clear throughout to auscultation, normal respiratory effort.    Heart:  +S1, +S2, Regular rate and rhythm, No edema. Abdomen:  Soft, nontender and nondistended. Normal bowel sounds, without guarding, and without rebound.   Neurologic:  Alert and  oriented x4;  grossly normal  neurologically.  Impression/Plan: Riley Martin is here for an endoscopy and colonoscopy  to be performed for  evaluation of iron deficiency anemia    Risks, benefits, limitations, and alternatives regarding endoscopy have been reviewed with the patient.  Questions have been answered.  All parties agreeable.   Jonathon Bellows, MD  05/13/2021, 10:51 AM

## 2021-05-16 ENCOUNTER — Encounter: Payer: Self-pay | Admitting: Gastroenterology

## 2021-05-16 LAB — SURGICAL PATHOLOGY

## 2021-05-26 ENCOUNTER — Other Ambulatory Visit: Payer: Self-pay | Admitting: Pulmonary Disease

## 2021-05-26 DIAGNOSIS — R911 Solitary pulmonary nodule: Secondary | ICD-10-CM

## 2021-05-31 ENCOUNTER — Encounter: Payer: Self-pay | Admitting: Gastroenterology

## 2021-06-02 ENCOUNTER — Ambulatory Visit: Admit: 2021-06-02 | Payer: BC Managed Care – PPO | Admitting: Gastroenterology

## 2021-06-02 SURGERY — ESOPHAGOGASTRODUODENOSCOPY (EGD) WITH PROPOFOL
Anesthesia: General

## 2021-06-08 ENCOUNTER — Ambulatory Visit
Admission: RE | Admit: 2021-06-08 | Discharge: 2021-06-08 | Disposition: A | Discharge status: Home / Self Care | Payer: BC Managed Care – PPO | Source: Ambulatory Visit | Attending: Pulmonary Disease | Admitting: Pulmonary Disease

## 2021-06-08 ENCOUNTER — Other Ambulatory Visit: Payer: Self-pay

## 2021-06-08 DIAGNOSIS — R911 Solitary pulmonary nodule: Secondary | ICD-10-CM | POA: Diagnosis present

## 2021-06-08 LAB — GLUCOSE, CAPILLARY: Glucose-Capillary: 95 mg/dL (ref 70–99)

## 2021-06-08 MED ORDER — FLUDEOXYGLUCOSE F - 18 (FDG) INJECTION
9.0600 | Freq: Once | INTRAVENOUS | Status: AC | PRN
  Administered 2021-06-08: 9.06 via INTRAVENOUS

## 2021-07-12 ENCOUNTER — Encounter: Payer: Self-pay | Admitting: Oncology

## 2021-07-12 ENCOUNTER — Inpatient Hospital Stay: Payer: BC Managed Care – PPO | Attending: Oncology | Admitting: Oncology

## 2021-07-12 ENCOUNTER — Inpatient Hospital Stay: Payer: BC Managed Care – PPO

## 2021-07-12 ENCOUNTER — Other Ambulatory Visit: Payer: Self-pay

## 2021-07-12 VITALS — BP 117/58 | HR 63 | Temp 97.4°F | Resp 18 | Ht 72.0 in | Wt 157.0 lb

## 2021-07-12 DIAGNOSIS — F32A Depression, unspecified: Secondary | ICD-10-CM | POA: Diagnosis not present

## 2021-07-12 DIAGNOSIS — N1832 Chronic kidney disease, stage 3b: Secondary | ICD-10-CM

## 2021-07-12 DIAGNOSIS — G894 Chronic pain syndrome: Secondary | ICD-10-CM

## 2021-07-12 DIAGNOSIS — Z888 Allergy status to other drugs, medicaments and biological substances status: Secondary | ICD-10-CM | POA: Diagnosis not present

## 2021-07-12 DIAGNOSIS — I129 Hypertensive chronic kidney disease with stage 1 through stage 4 chronic kidney disease, or unspecified chronic kidney disease: Secondary | ICD-10-CM

## 2021-07-12 DIAGNOSIS — R5383 Other fatigue: Secondary | ICD-10-CM

## 2021-07-12 DIAGNOSIS — Z85828 Personal history of other malignant neoplasm of skin: Secondary | ICD-10-CM | POA: Diagnosis not present

## 2021-07-12 DIAGNOSIS — E611 Iron deficiency: Secondary | ICD-10-CM

## 2021-07-12 DIAGNOSIS — K5520 Angiodysplasia of colon without hemorrhage: Secondary | ICD-10-CM | POA: Diagnosis not present

## 2021-07-12 DIAGNOSIS — Z7901 Long term (current) use of anticoagulants: Secondary | ICD-10-CM | POA: Diagnosis not present

## 2021-07-12 DIAGNOSIS — Z9981 Dependence on supplemental oxygen: Secondary | ICD-10-CM

## 2021-07-12 DIAGNOSIS — R0602 Shortness of breath: Secondary | ICD-10-CM

## 2021-07-12 DIAGNOSIS — E538 Deficiency of other specified B group vitamins: Secondary | ICD-10-CM

## 2021-07-12 DIAGNOSIS — K219 Gastro-esophageal reflux disease without esophagitis: Secondary | ICD-10-CM | POA: Diagnosis not present

## 2021-07-12 DIAGNOSIS — D631 Anemia in chronic kidney disease: Secondary | ICD-10-CM | POA: Diagnosis not present

## 2021-07-12 DIAGNOSIS — N400 Enlarged prostate without lower urinary tract symptoms: Secondary | ICD-10-CM

## 2021-07-12 DIAGNOSIS — Z8616 Personal history of COVID-19: Secondary | ICD-10-CM | POA: Diagnosis not present

## 2021-07-12 DIAGNOSIS — K589 Irritable bowel syndrome without diarrhea: Secondary | ICD-10-CM

## 2021-07-12 DIAGNOSIS — Z885 Allergy status to narcotic agent status: Secondary | ICD-10-CM | POA: Diagnosis not present

## 2021-07-12 DIAGNOSIS — E1122 Type 2 diabetes mellitus with diabetic chronic kidney disease: Secondary | ICD-10-CM | POA: Diagnosis not present

## 2021-07-12 DIAGNOSIS — Z87891 Personal history of nicotine dependence: Secondary | ICD-10-CM | POA: Diagnosis not present

## 2021-07-12 DIAGNOSIS — D500 Iron deficiency anemia secondary to blood loss (chronic): Secondary | ICD-10-CM | POA: Insufficient documentation

## 2021-07-12 DIAGNOSIS — O280 Abnormal hematological finding on antenatal screening of mother: Secondary | ICD-10-CM

## 2021-07-12 DIAGNOSIS — J449 Chronic obstructive pulmonary disease, unspecified: Secondary | ICD-10-CM | POA: Diagnosis not present

## 2021-07-12 DIAGNOSIS — Z79899 Other long term (current) drug therapy: Secondary | ICD-10-CM

## 2021-07-12 HISTORY — DX: Iron deficiency anemia secondary to blood loss (chronic): D50.0

## 2021-07-12 LAB — COMPREHENSIVE METABOLIC PANEL
ALT: 21 U/L (ref 0–44)
AST: 18 U/L (ref 15–41)
Albumin: 3.8 g/dL (ref 3.5–5.0)
Alkaline Phosphatase: 42 U/L (ref 38–126)
Anion gap: 6 (ref 5–15)
BUN: 41 mg/dL — ABNORMAL HIGH (ref 8–23)
CO2: 26 mmol/L (ref 22–32)
Calcium: 8.7 mg/dL — ABNORMAL LOW (ref 8.9–10.3)
Chloride: 101 mmol/L (ref 98–111)
Creatinine, Ser: 2 mg/dL — ABNORMAL HIGH (ref 0.61–1.24)
GFR, Estimated: 34 mL/min — ABNORMAL LOW (ref >60)
Glucose, Bld: 103 mg/dL — ABNORMAL HIGH (ref 70–99)
Potassium: 5 mmol/L (ref 3.5–5.1)
Sodium: 133 mmol/L — ABNORMAL LOW (ref 135–145)
Total Bilirubin: 0.3 mg/dL (ref 0.3–1.2)
Total Protein: 7.1 g/dL (ref 6.5–8.1)

## 2021-07-12 LAB — CBC WITH DIFFERENTIAL/PLATELET
Abs Immature Granulocytes: 0.21 10*3/uL — ABNORMAL HIGH (ref 0.00–0.07)
Basophils Absolute: 0 10*3/uL (ref 0.0–0.1)
Basophils Relative: 1 %
Eosinophils Absolute: 0.2 10*3/uL (ref 0.0–0.5)
Eosinophils Relative: 2 %
HCT: 37.2 % — ABNORMAL LOW (ref 39.0–52.0)
Hemoglobin: 11.5 g/dL — ABNORMAL LOW (ref 13.0–17.0)
Immature Granulocytes: 3 %
Lymphocytes Relative: 18 %
Lymphs Abs: 1.5 10*3/uL (ref 0.7–4.0)
MCH: 28.5 pg (ref 26.0–34.0)
MCHC: 30.9 g/dL (ref 30.0–36.0)
MCV: 92.1 fL (ref 80.0–100.0)
Monocytes Absolute: 0.8 10*3/uL (ref 0.1–1.0)
Monocytes Relative: 10 %
Neutro Abs: 5.5 10*3/uL (ref 1.7–7.7)
Neutrophils Relative %: 66 %
Platelets: 265 10*3/uL (ref 150–400)
RBC: 4.04 MIL/uL — ABNORMAL LOW (ref 4.22–5.81)
RDW: 17.9 % — ABNORMAL HIGH (ref 11.5–15.5)
WBC: 8.2 10*3/uL (ref 4.0–10.5)
nRBC: 0 % (ref 0.0–0.2)

## 2021-07-12 LAB — RETIC PANEL
Immature Retic Fract: 20.4 % — ABNORMAL HIGH (ref 2.3–15.9)
RBC.: 3.96 MIL/uL — ABNORMAL LOW (ref 4.22–5.81)
Retic Count, Absolute: 84 10*3/uL (ref 19.0–186.0)
Retic Ct Pct: 2.1 % (ref 0.4–3.1)
Reticulocyte Hemoglobin: 33 pg (ref >27.9)

## 2021-07-12 LAB — IRON AND TIBC
Iron: 65 ug/dL (ref 45–182)
Saturation Ratios: 16 % — ABNORMAL LOW (ref 17.9–39.5)
TIBC: 399 ug/dL (ref 250–450)
UIBC: 334 ug/dL

## 2021-07-12 LAB — TECHNOLOGIST SMEAR REVIEW
Plt Morphology: NORMAL
RBC MORPHOLOGY: NORMAL
WBC MORPHOLOGY: NORMAL

## 2021-07-12 LAB — LACTATE DEHYDROGENASE: LDH: 148 U/L (ref 98–192)

## 2021-07-12 LAB — TSH: TSH: 4.634 u[IU]/mL — ABNORMAL HIGH (ref 0.350–4.500)

## 2021-07-12 LAB — FERRITIN: Ferritin: 15 ng/mL — ABNORMAL LOW (ref 24–336)

## 2021-07-12 LAB — VITAMIN B12: Vitamin B-12: 194 pg/mL (ref 180–914)

## 2021-07-12 NOTE — Progress Notes (Signed)
Hematology/Oncology Consult note [phone] [phone]         Patient Care Team: [name], MD as PCP - General (Family Medicine)  REFERRING PROVIDER: Wonda Cerise, PA-C  CHIEF COMPLAINTS/REASON FOR VISIT:  Evaluation of anemia  HISTORY OF PRESENTING ILLNESS:   Riley Martin is a  76 y.o.  male with PMH listed below was seen in consultation at the request of  Wonda Cerise, PA-C  for evaluation of anemia.   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  Hospitalized from 05/10/21 - 05/13/21 due to dizziness/lightlessness, acute drop of hemoglobin to 5.7.  He was transfused multiple units of PRBCs during his hospitalization, and IV iron.  He underwent EGD- which is not remarkable.  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 His Xarelto was held during admission and resumed.  He continues to feel week and was referred to hematology for further evaluation of anemia.  Accompanied by wife. Appetite is good.   Review of Systems  Constitutional:  Positive for fatigue. Negative for appetite change, chills, fever and unexpected weight change.  HENT:   Negative for hearing loss and voice change.   Eyes:  Negative for eye problems and icterus.  Respiratory:  Positive for shortness of breath. Negative for chest tightness and cough.   Cardiovascular:  Negative for chest pain and leg swelling.  Gastrointestinal:  Negative for abdominal distention and abdominal pain.  Endocrine: Negative for hot flashes.  Genitourinary:  Negative for difficulty urinating, dysuria and frequency.   Musculoskeletal:  Negative for arthralgias.  Skin:  Negative for itching and rash.  Neurological:  Negative for light-headedness and numbness.  Hematological:  Negative for adenopathy. Does not  bruise/bleed easily.  Psychiatric/Behavioral:  Negative for confusion.    MEDICAL HISTORY:  Past Medical History:  Diagnosis Date   Arthritis    BPH (benign prostatic hyperplasia)    Cancer (HCC)    skin cancer   Chronic pain    COPD (chronic obstructive pulmonary disease) (HCC)    COVID    GERD (gastroesophageal reflux disease)    Headache    History of insomnia    IBS (irritable bowel syndrome)    Orthostatic dizziness    PONV (postoperative nausea and vomiting)    Spinal stenosis    Torn rotator cuff    left    SURGICAL HISTORY: Past Surgical History:  Procedure Laterality Date   COLONOSCOPY     COLONOSCOPY WITH PROPOFOL N/A 05/13/2021   Procedure: COLONOSCOPY WITH PROPOFOL;  Surgeon: Jonathon Bellows, MD;  Location: [facility] ENDOSCOPY;  Service: Gastroenterology;  Laterality: N/A;   ESOPHAGOGASTRODUODENOSCOPY N/A 03/14/2021   Procedure: ESOPHAGOGASTRODUODENOSCOPY (EGD);  Surgeon: [name], DO;  Location: [facility] ENDOSCOPY;  Service: Gastroenterology;  Laterality: N/A;   ESOPHAGOGASTRODUODENOSCOPY N/A 05/13/2021   Procedure: ESOPHAGOGASTRODUODENOSCOPY (EGD);  Surgeon: Jonathon Bellows, MD;  Location: [facility] ENDOSCOPY;  Service: Gastroenterology;  Laterality: N/A;   EXCISION CHONCHA BULLOSA Bilateral 06/02/2015   Procedure: BILATERAL CHONCHA BULLOSA;  Surgeon: Carloyn Manner, MD;  Location: [place];  Service: ENT;  Laterality: Bilateral;   HERNIA REPAIR     MAXILLARY ANTROSTOMY Bilateral 06/02/2015   Procedure: MAXILLARY ANTROSTOMY;  Surgeon: Carloyn Manner, MD;  Location: [place];  Service: ENT;  Laterality: Bilateral;   NASAL POLYP SURGERY     NECK SURGERY  05/08/2006  no limitations   UPPER GASTROINTESTINAL ENDOSCOPY      SOCIAL HISTORY: Social History   Socioeconomic History   Marital status: Married    Spouse name: Not on file   Number of children: Not on file   Years of education: Not on file   Highest education level: Not on file   Occupational History   Not on file  Tobacco Use   Smoking status: Former    Packs/day: 1.00    Years: 57.00    Pack years: 57.00    Types: Cigarettes    Quit date: 2020    Years since quitting: 3.1   Smokeless tobacco: Never  Vaping Use   Vaping Use: Never used  Substance and Sexual Activity   Alcohol use: No   Drug use: No   Sexual activity: Not on file  Other Topics Concern   Not on file  Social History Narrative   Not on file   Social Determinants of Health   Financial Resource Strain: Not on file  Food Insecurity: Not on file  Transportation Needs: Not on file  Physical Activity: Not on file  Stress: Not on file  Social Connections: Not on file  Intimate Partner Violence: Not on file    FAMILY HISTORY: History reviewed. No pertinent family history.  ALLERGIES:  is allergic to iodinated contrast media, lisinopril, tramadol, and pregabalin.  MEDICATIONS:  Current Outpatient Medications  Medication Sig Dispense Refill   albuterol (PROVENTIL) (2.5 MG/3ML) 0.083% nebulizer solution Take 3 mLs by nebulization every 6 (six) hours as needed for wheezing or shortness of breath.     amiodarone (PACERONE) 200 MG tablet Take 1 tablet (200 mg total) by mouth 2 (two) times daily. (Patient taking differently: Take 200 mg by mouth daily.) 60 tablet 1   amoxicillin (AMOXIL) 500 MG capsule Take 500 mg by mouth 3 (three) times daily.     apixaban (ELIQUIS) 2.5 MG TABS tablet Take by mouth.     blood glucose meter kit and supplies KIT Dispense based on patient and insurance preference. Use up to four times daily as directed. 1 each 0   budesonide (PULMICORT) 0.5 MG/2ML nebulizer solution Inhale into the lungs.     budesonide (PULMICORT) 1 MG/2ML nebulizer solution Take 1 mg by nebulization daily.     cyanocobalamin 1000 MCG tablet Take by mouth.     ferrous sulfate 325 (65 FE) MG tablet Take 1 tablet (325 mg total) by mouth 2 (two) times daily with a meal. 60 tablet 11    fluticasone (FLONASE) 50 MCG/ACT nasal spray SPRAY 2 SPRAYS INTO EACH NOSTRIL EVERY DAY 16 g 0   hydrocortisone 2.5 % cream Apply topically.     hyoscyamine (LEVSIN SL) 0.125 MG SL tablet Place 0.125 mg under the tongue every 4 (four) hours as needed.     ipratropium (ATROVENT) 0.02 % nebulizer solution Take 0.5 mg by nebulization 2 (two) times daily.     levalbuterol (XOPENEX HFA) 45 MCG/ACT inhaler Inhale 2 puffs into the lungs every 4 (four) hours as needed for wheezing.     metoprolol tartrate (LOPRESSOR) 50 MG tablet Take 50 mg by mouth daily.     mirtazapine (REMERON) 15 MG tablet Take 15 mg by mouth at bedtime.     mometasone-formoterol (DULERA) 200-5 MCG/ACT AERO Inhale 2 puffs into the lungs 2 (two) times daily. 1 each 1   Nutritional Supplements (FEEDING SUPPLEMENT, NEPRO CARB STEADY,) LIQD Take 237 mLs by mouth 3 (three) times daily between  meals.  0   ondansetron (ZOFRAN-ODT) 4 MG disintegrating tablet Take 4 mg by mouth every 8 (eight) hours as needed for nausea or vomiting.     oxyCODONE-acetaminophen (PERCOCET) 10-325 MG tablet Take 1 tablet by mouth 5 (five) times daily as needed.     OXYCONTIN 10 MG 12 hr tablet Take 10 mg by mouth 3 (three) times daily.     pantoprazole (PROTONIX) 20 MG tablet Take 20 mg by mouth daily.     sodium chloride (OCEAN) 0.65 % SOLN nasal spray Place 1 spray into both nostrils as needed for congestion. 30 mL 1   spironolactone (ALDACTONE) 25 MG tablet Take 25 mg by mouth daily.     tamsulosin (FLOMAX) 0.4 MG CAPS capsule Take 0.4 mg by mouth daily.     tiotropium (SPIRIVA) 18 MCG inhalation capsule Place 1 capsule (18 mcg total) into inhaler and inhale daily. 30 capsule 1   VENTOLIN HFA 108 (90 Base) MCG/ACT inhaler Inhale 2 puffs into the lungs every 6 (six) hours as needed.     furosemide (LASIX) 20 MG tablet Take 1 tablet (20 mg total) by mouth 2 (two) times daily. Resume it after checking kidney function and after discussing with primary care doctor  (Patient not taking: Reported on 07/12/2021) 30 tablet    Insulin Pen Needle (PEN NEEDLES) 31G X 6 MM MISC 1 each by Does not apply route 3 (three) times daily with meals. (Patient not taking: Reported on 07/12/2021) 100 each 0   meclizine (ANTIVERT) 12.5 MG tablet Take 12.5 mg by mouth 3 (three) times daily as needed for dizziness. (Patient not taking: Reported on 07/12/2021)     megestrol (MEGACE ES) 625 MG/5ML suspension Take 625 mg by mouth daily. (Patient not taking: Reported on 07/12/2021)     Multiple Vitamin (MULTIVITAMIN WITH MINERALS) TABS tablet Take 1 tablet by mouth daily. (Patient not taking: Reported on 07/12/2021)     Rivaroxaban (XARELTO) 15 MG TABS tablet Take 1 tablet (15 mg total) by mouth daily with supper. Hold for 3 days (Patient not taking: Reported on 07/12/2021) 42 tablet 1   No current facility-administered medications for this visit.     PHYSICAL EXAMINATION: ECOG PERFORMANCE STATUS: 2 - Symptomatic, <50% confined to bed Vitals:   07/12/21 1502  BP: (!) 117/58  Pulse: 63  Resp: 18  Temp: (!) 97.4 F (36.3 C)  SpO2: 98%   Filed Weights   07/12/21 1502  Weight: 157 lb (71.2 kg)    Physical Exam Constitutional:      General: He is not in acute distress.    Comments: Patient sits in the wheelchair.   HENT:     Head: Normocephalic and atraumatic.  Eyes:     General: No scleral icterus. Cardiovascular:     Rate and Rhythm: Normal rate and regular rhythm.     Heart sounds: Normal heart sounds.  Pulmonary:     Effort: Pulmonary effort is normal. No respiratory distress.     Breath sounds: No wheezing.     Comments: Severely decreased breath sound.  Abdominal:     General: Bowel sounds are normal. There is no distension.     Palpations: Abdomen is soft.  Musculoskeletal:        General: No deformity. Normal range of motion.     Cervical back: Normal range of motion and neck supple.  Skin:    General: Skin is warm and dry.     Coloration: Skin is pale.  Findings: No erythema or rash.  Neurological:     Mental Status: He is alert and oriented to person, place, and time. Mental status is at baseline.     Cranial Nerves: No cranial nerve deficit.     Coordination: Coordination normal.  Psychiatric:        Mood and Affect: Mood normal.    LABORATORY DATA:  I have reviewed the data as listed Lab Results  Component Value Date   WBC 8.2 07/12/2021   HGB 11.5 (L) 07/12/2021   HCT 37.2 (L) 07/12/2021   MCV 92.1 07/12/2021   PLT 265 07/12/2021   Recent Labs    12/28/20 0557 12/29/20 0541 05/10/21 1127 05/12/21 0148 05/13/21 0716 07/12/21 1555  NA 138 137   < > 140 141 133*  K 4.4 4.5   < > 4.7 4.2 5.0  CL 99 99   < > 107 109 101  CO2 31 28   < > _0 GLUCOSE 108* 100*   < > 91 90 103*  BUN 56* 52*   < > 30* 18 41*  CREATININE 1.77* 1.75*   < > 1.98* 2.03* 2.00*  CALCIUM 8.4* 8.4*   < > 8.6* 8.6* 8.7*  GFRNONAA 39* 40*   < > 34* 33* 34*  PROT 6.0* 5.9*  --   --   --  7.1  ALBUMIN 3.0* 3.1*  --   --   --  3.8  AST 69* 73*  --   --   --  18  ALT 109* 144*  --   --   --  21  ALKPHOS 37* 39  --   --   --  42  BILITOT 0.6 0.8  --   --   --  0.3   < > = values in this interval not displayed.   Iron/TIBC/Ferritin/ %Sat    Component Value Date/Time   IRON 65 07/12/2021 1555   TIBC 399 07/12/2021 1555   FERRITIN 15 (L) 07/12/2021 1555   IRONPCTSAT 16 (L) 07/12/2021 1555      RADIOGRAPHIC STUDIES: I have personally reviewed the radiological images as listed and agreed with the findings in the report. No results found.    ASSESSMENT & PLAN:  1. Anemia of chronic renal failure, stage 3b ([place])   2. Low maternal serum vitamin B12   3. Other fatigue   4. Iron deficiency anemia due to chronic blood loss    # Anemia, likely multifactorial - blood loss/Iron deficiency, CKD # Fatigue # low B12 Check cbc, myeloma panel, light chain ratio, UPEP, vitamin b12 level, Tsh, iron tibc ferritin, LDH, haptoglobin, retic panel.   Labs are reviewed.  IDA, and B12 deficiency.  Discussed about rationale and side effects of IV venofer. He agrees. Plan IV Venofer weekly x 5 Recommend to start parental B12 IM weekly x 4 followed by monthly x 2   Orders Placed This Encounter  Procedures   Comprehensive metabolic panel    Standing Status:   Future    Number of Occurrences:   1    Standing Expiration Date:   07/13/2022   Ferritin    Standing Status:   Future    Number of Occurrences:   1    Standing Expiration Date:   01/12/2022   Iron and TIBC    Standing Status:   Future    Number of Occurrences:   1    Standing Expiration Date:   07/13/2022  Lactate dehydrogenase    Standing Status:   Future    Number of Occurrences:   1    Standing Expiration Date:   07/13/2022   Haptoglobin    Standing Status:   Future    Number of Occurrences:   1    Standing Expiration Date:   07/13/2022   Retic Panel    Standing Status:   Future    Number of Occurrences:   1    Standing Expiration Date:   07/13/2022   CBC with Differential/Platelet    Standing Status:   Future    Number of Occurrences:   1    Standing Expiration Date:   07/13/2022   Technologist smear review    Standing Status:   Future    Number of Occurrences:   1    Standing Expiration Date:   07/13/2022   Kappa/lambda light chains    Standing Status:   Future    Number of Occurrences:   1    Standing Expiration Date:   07/13/2022   Multiple Myeloma Panel (SPEP&IFE w/QIG)    Standing Status:   Future    Number of Occurrences:   1    Standing Expiration Date:   07/13/2022   IFE+PROTEIN ELECTRO, 24-HR UR    Standing Status:   Future    Standing Expiration Date:   07/13/2022   Vitamin B12    Standing Status:   Future    Number of Occurrences:   1    Standing Expiration Date:   07/13/2022   TSH    Standing Status:   Future    Number of Occurrences:   1    Standing Expiration Date:   07/13/2022    All questions were answered. The patient knows to call the clinic with any  problems questions or concerns.  cc Wonda Cerise, PA-C    Return of visit: 3 months lab MD +/- Venofer/ B12 injection.  Thank you for this kind referral and the opportunity to participate in the care of this patient. A copy of today's note is routed to referring provider   Earlie Server, MD, PhD [facility] Hematology Oncology 07/12/2021

## 2021-07-13 ENCOUNTER — Other Ambulatory Visit: Payer: Self-pay

## 2021-07-13 ENCOUNTER — Telehealth: Payer: Self-pay

## 2021-07-13 DIAGNOSIS — I129 Hypertensive chronic kidney disease with stage 1 through stage 4 chronic kidney disease, or unspecified chronic kidney disease: Secondary | ICD-10-CM | POA: Diagnosis not present

## 2021-07-13 DIAGNOSIS — N1832 Chronic kidney disease, stage 3b: Secondary | ICD-10-CM

## 2021-07-13 DIAGNOSIS — D631 Anemia in chronic kidney disease: Secondary | ICD-10-CM

## 2021-07-13 LAB — KAPPA/LAMBDA LIGHT CHAINS
Kappa free light chain: 72.1 mg/L — ABNORMAL HIGH (ref 3.3–19.4)
Kappa, lambda light chain ratio: 3.47 — ABNORMAL HIGH (ref 0.26–1.65)
Lambda free light chains: 20.8 mg/L (ref 5.7–26.3)

## 2021-07-13 LAB — HAPTOGLOBIN: Haptoglobin: 321 mg/dL (ref 34–355)

## 2021-07-13 LAB — T4, FREE: Free T4: 1.1 ng/dL (ref 0.61–1.12)

## 2021-07-13 NOTE — Telephone Encounter (Signed)
Called to inform patient of lab results. Patient did not answer. Left message for patient to call back to further discuss results and Dr. Collie Siad recommendations.  ?

## 2021-07-13 NOTE — Telephone Encounter (Signed)
07/13/2021 ?Left VM for pt informing him of upcoming appts. Gave call back number in case there are any questions. Will put a copy of AVS in the mail  ?SRW  ?

## 2021-07-13 NOTE — Telephone Encounter (Signed)
-----   Message from Earlie Server, MD sent at 07/12/2021 11:38 PM EST ----- ?Please let patient know that his iron level is low, I recommend him to get IV venofer weekly x 5 ?B12 is low, recommend B12 injection weekly x 4 followed by monthly until next visit.  ?Some other labs are still pending.  ?Follow up in 3 months, lab - [ ordered]- prior to MD + venofer + B12 injection. Thanks.  ?

## 2021-07-13 NOTE — Telephone Encounter (Signed)
Spoke with patient and wife and infomred them of results and Dr. Collie Siad recommendation. Both verbalized understanding.  ? ? ?Colletta [place], please schedule patient for: ?IV venofer weekly x 5 ?B12 injection weekly x 4 ?Lab in 3 months ?MD/+venofer/+B12 2 days after...ahs ?

## 2021-07-14 ENCOUNTER — Encounter: Payer: Self-pay | Admitting: Oncology

## 2021-07-14 LAB — MULTIPLE MYELOMA PANEL, SERUM
Albumin SerPl Elph-Mcnc: 3.4 g/dL (ref 2.9–4.4)
Albumin/Glob SerPl: 1.2 (ref 0.7–1.7)
Alpha 1: 0.2 g/dL (ref 0.0–0.4)
Alpha2 Glob SerPl Elph-Mcnc: 0.9 g/dL (ref 0.4–1.0)
B-Globulin SerPl Elph-Mcnc: 1 g/dL (ref 0.7–1.3)
Gamma Glob SerPl Elph-Mcnc: 0.9 g/dL (ref 0.4–1.8)
Globulin, Total: 3 g/dL (ref 2.2–3.9)
IgA: 99 mg/dL (ref 61–437)
IgG (Immunoglobin G), Serum: 706 mg/dL (ref 603–1613)
IgM (Immunoglobulin M), Srm: 335 mg/dL — ABNORMAL HIGH (ref 15–143)
M Protein SerPl Elph-Mcnc: 0.2 g/dL — ABNORMAL HIGH
Total Protein ELP: 6.4 g/dL (ref 6.0–8.5)

## 2021-07-20 ENCOUNTER — Other Ambulatory Visit: Payer: Self-pay

## 2021-07-20 ENCOUNTER — Inpatient Hospital Stay: Payer: BC Managed Care – PPO

## 2021-07-20 VITALS — BP 126/76 | HR 80 | Temp 97.0°F | Resp 18

## 2021-07-20 DIAGNOSIS — O280 Abnormal hematological finding on antenatal screening of mother: Secondary | ICD-10-CM

## 2021-07-20 DIAGNOSIS — D631 Anemia in chronic kidney disease: Secondary | ICD-10-CM

## 2021-07-20 DIAGNOSIS — I129 Hypertensive chronic kidney disease with stage 1 through stage 4 chronic kidney disease, or unspecified chronic kidney disease: Secondary | ICD-10-CM | POA: Diagnosis not present

## 2021-07-20 DIAGNOSIS — D500 Iron deficiency anemia secondary to blood loss (chronic): Secondary | ICD-10-CM

## 2021-07-20 MED ORDER — SODIUM CHLORIDE 0.9 % IV SOLN
Freq: Once | INTRAVENOUS | Status: AC
  Filled 2021-07-20: qty 250

## 2021-07-20 MED ORDER — SODIUM CHLORIDE 0.9 % IV SOLN: 200.0000 mg | Freq: Once | INTRAVENOUS | Status: DC

## 2021-07-20 MED ORDER — CYANOCOBALAMIN 1000 MCG/ML IJ SOLN
1000.0000 ug | Freq: Once | INTRAMUSCULAR | Status: AC
  Administered 2021-07-20: 1000 ug via INTRAMUSCULAR
  Filled 2021-07-20: qty 1

## 2021-07-20 MED ORDER — IRON SUCROSE 20 MG/ML IV SOLN
200.0000 mg | Freq: Once | INTRAVENOUS | Status: AC
  Administered 2021-07-20: 200 mg via INTRAVENOUS
  Filled 2021-07-20: qty 10

## 2021-07-20 NOTE — Patient Instructions (Signed)

## 2021-07-22 LAB — IFE+PROTEIN ELECTRO, 24-HR UR
% BETA, Urine: 26.7 %
ALPHA 1 URINE: 11.4 %
Albumin, U: 19.7 %
Alpha 2, Urine: 21.9 %
GAMMA GLOBULIN URINE: 20.4 %
Total Protein, Urine-Ur/day: 140 mg/24 hr (ref 30–150)
Total Protein, Urine: 10 mg/dL
Total Volume: 1400

## 2021-07-27 ENCOUNTER — Other Ambulatory Visit: Payer: Self-pay

## 2021-07-27 ENCOUNTER — Inpatient Hospital Stay: Payer: BC Managed Care – PPO

## 2021-07-27 VITALS — BP 140/60 | HR 77 | Temp 99.0°F | Resp 20

## 2021-07-27 DIAGNOSIS — N1832 Chronic kidney disease, stage 3b: Secondary | ICD-10-CM

## 2021-07-27 DIAGNOSIS — I129 Hypertensive chronic kidney disease with stage 1 through stage 4 chronic kidney disease, or unspecified chronic kidney disease: Secondary | ICD-10-CM | POA: Diagnosis not present

## 2021-07-27 DIAGNOSIS — D500 Iron deficiency anemia secondary to blood loss (chronic): Secondary | ICD-10-CM

## 2021-07-27 MED ORDER — SODIUM CHLORIDE 0.9 % IV SOLN
Freq: Once | INTRAVENOUS | Status: AC
  Filled 2021-07-27: qty 250

## 2021-07-27 MED ORDER — SODIUM CHLORIDE 0.9 % IV SOLN: 200.0000 mg | Freq: Once | INTRAVENOUS | Status: DC

## 2021-07-27 MED ORDER — CYANOCOBALAMIN 1000 MCG/ML IJ SOLN
1000.0000 ug | Freq: Once | INTRAMUSCULAR | Status: AC
  Administered 2021-07-27: 1000 ug via INTRAMUSCULAR
  Filled 2021-07-27: qty 1

## 2021-07-27 MED ORDER — IRON SUCROSE 20 MG/ML IV SOLN
200.0000 mg | Freq: Once | INTRAVENOUS | Status: AC
  Administered 2021-07-27: 200 mg via INTRAVENOUS
  Filled 2021-07-27: qty 10

## 2021-08-03 ENCOUNTER — Inpatient Hospital Stay: Payer: BC Managed Care – PPO

## 2021-08-03 VITALS — BP 126/60 | HR 68 | Temp 99.4°F | Resp 20

## 2021-08-03 DIAGNOSIS — I129 Hypertensive chronic kidney disease with stage 1 through stage 4 chronic kidney disease, or unspecified chronic kidney disease: Secondary | ICD-10-CM | POA: Diagnosis not present

## 2021-08-03 DIAGNOSIS — N1832 Chronic kidney disease, stage 3b: Secondary | ICD-10-CM

## 2021-08-03 DIAGNOSIS — D500 Iron deficiency anemia secondary to blood loss (chronic): Secondary | ICD-10-CM

## 2021-08-03 MED ORDER — SODIUM CHLORIDE 0.9 % IV SOLN
Freq: Once | INTRAVENOUS | Status: AC
  Filled 2021-08-03: qty 250

## 2021-08-03 MED ORDER — SODIUM CHLORIDE 0.9 % IV SOLN: 200.0000 mg | Freq: Once | INTRAVENOUS | Status: DC

## 2021-08-03 MED ORDER — IRON SUCROSE 20 MG/ML IV SOLN
200.0000 mg | Freq: Once | INTRAVENOUS | Status: AC
  Administered 2021-08-03: 200 mg via INTRAVENOUS
  Filled 2021-08-03: qty 10

## 2021-08-03 MED ORDER — CYANOCOBALAMIN 1000 MCG/ML IJ SOLN
1000.0000 ug | Freq: Once | INTRAMUSCULAR | Status: AC
  Administered 2021-08-03: 1000 ug via INTRAMUSCULAR
  Filled 2021-08-03: qty 1

## 2021-08-08 ENCOUNTER — Inpatient Hospital Stay: Payer: BC Managed Care – PPO

## 2021-08-10 ENCOUNTER — Inpatient Hospital Stay: Payer: BC Managed Care – PPO | Attending: Oncology

## 2021-08-10 VITALS — BP 127/63 | HR 82 | Resp 20

## 2021-08-10 DIAGNOSIS — D500 Iron deficiency anemia secondary to blood loss (chronic): Secondary | ICD-10-CM

## 2021-08-10 DIAGNOSIS — N1832 Chronic kidney disease, stage 3b: Secondary | ICD-10-CM | POA: Insufficient documentation

## 2021-08-10 DIAGNOSIS — D631 Anemia in chronic kidney disease: Secondary | ICD-10-CM | POA: Insufficient documentation

## 2021-08-10 MED ORDER — SODIUM CHLORIDE 0.9 % IV SOLN
Freq: Once | INTRAVENOUS | Status: AC
  Filled 2021-08-10: qty 250

## 2021-08-10 MED ORDER — IRON SUCROSE 20 MG/ML IV SOLN
200.0000 mg | Freq: Once | INTRAVENOUS | Status: AC
  Administered 2021-08-10: 200 mg via INTRAVENOUS
  Filled 2021-08-10: qty 10

## 2021-08-10 MED ORDER — CYANOCOBALAMIN 1000 MCG/ML IJ SOLN
1000.0000 ug | Freq: Once | INTRAMUSCULAR | Status: AC
  Administered 2021-08-10: 1000 ug via INTRAMUSCULAR
  Filled 2021-08-10: qty 1

## 2021-08-10 MED ORDER — SODIUM CHLORIDE 0.9 % IV SOLN: 200.0000 mg | Freq: Once | INTRAVENOUS | Status: DC

## 2021-08-17 ENCOUNTER — Inpatient Hospital Stay: Payer: BC Managed Care – PPO

## 2021-08-17 VITALS — BP 131/68 | HR 72 | Temp 96.9°F | Resp 18

## 2021-08-17 DIAGNOSIS — D500 Iron deficiency anemia secondary to blood loss (chronic): Secondary | ICD-10-CM

## 2021-08-17 DIAGNOSIS — D631 Anemia in chronic kidney disease: Secondary | ICD-10-CM

## 2021-08-17 DIAGNOSIS — N1832 Chronic kidney disease, stage 3b: Secondary | ICD-10-CM | POA: Diagnosis not present

## 2021-08-17 MED ORDER — IRON SUCROSE 20 MG/ML IV SOLN
200.0000 mg | Freq: Once | INTRAVENOUS | Status: AC
  Administered 2021-08-17: 200 mg via INTRAVENOUS
  Filled 2021-08-17: qty 10

## 2021-08-17 MED ORDER — SODIUM CHLORIDE 0.9 % IV SOLN: 200.0000 mg | Freq: Once | INTRAVENOUS | Status: DC

## 2021-08-17 MED ORDER — SODIUM CHLORIDE 0.9 % IV SOLN
Freq: Once | INTRAVENOUS | Status: AC
  Filled 2021-08-17: qty 250

## 2021-08-17 NOTE — Patient Instructions (Signed)
[facility]  Discharge Instructions: ?Thank you for choosing [facility] to provide your oncology and hematology care.  ?If you have a lab appointment with the Cancer Center, please go directly to the Cancer Center and check in at the registration area. ? ?Wear comfortable clothing and clothing appropriate for easy access to any Portacath or PICC line.  ? ?We strive to give you quality time with your provider. You may need to reschedule your appointment if you arrive late (15 or more minutes).  Arriving late affects you and other patients whose appointments are after yours.  Also, if you miss three or more appointments without notifying the office, you may be dismissed from the clinic at the provider?s discretion.    ?  ?For prescription refill requests, have your pharmacy contact our office and allow 72 hours for refills to be completed.   ? ?Today you received the following chemotherapy and/or immunotherapy agents VENOFER    ?  ?To help prevent nausea and vomiting after your treatment, we encourage you to take your nausea medication as directed. ? ?BELOW ARE SYMPTOMS THAT SHOULD BE REPORTED IMMEDIATELY: ?*FEVER GREATER THAN 100.4 F (38 ?C) OR HIGHER ?*CHILLS OR SWEATING ?*NAUSEA AND VOMITING THAT IS NOT CONTROLLED WITH YOUR NAUSEA MEDICATION ?*UNUSUAL SHORTNESS OF BREATH ?*UNUSUAL BRUISING OR BLEEDING ?*URINARY PROBLEMS (pain or burning when urinating, or frequent urination) ?*BOWEL PROBLEMS (unusual diarrhea, constipation, pain near the anus) ?TENDERNESS IN MOUTH AND THROAT WITH OR WITHOUT PRESENCE OF ULCERS (sore throat, sores in mouth, or a toothache) ?UNUSUAL RASH, SWELLING OR PAIN  ?UNUSUAL VAGINAL DISCHARGE OR ITCHING  ? ?Items with * indicate a potential emergency and should be followed up as soon as possible or go to the Emergency Department if any problems should occur. ? ?Please show the CHEMOTHERAPY ALERT CARD or IMMUNOTHERAPY ALERT CARD at check-in to the  Emergency Department and triage nurse. ? ?Should you have questions after your visit or need to cancel or reschedule your appointment, please contact [facility]  [phone] and follow the prompts.  Office hours are 8:00 a.m. to 4:30 p.m. Monday - Friday. Please note that voicemails left after 4:00 p.m. may not be returned until the following business day.  We are closed weekends and major holidays. You have access to a nurse at all times for urgent questions. Please call the main number to the clinic [phone] and follow the prompts. ? ?For any non-urgent questions, you may also contact your provider using MyChart. We now offer e-Visits for anyone 18 and older to request care online for non-urgent symptoms. For details visit [email or web address]. ?  ?Also download the MyChart app! Go to the app store, search "MyChart", open the app, select [place], and log in with your MyChart username and password. ? ?Due to Covid, a mask is required upon entering the hospital/clinic. If you do not have a mask, one will be given to you upon arrival. For doctor visits, patients may have 1 support person aged 18 or older with them. For treatment visits, patients cannot have anyone with them due to current Covid guidelines and our immunocompromised population.  ? ?Iron Sucrose Injection ?What is this medication? ?IRON SUCROSE (EYE ern SOO krose) treats low levels of iron (iron deficiency anemia) in people with kidney disease. Iron is a mineral that plays an important role in making red blood cells, which carry oxygen from your lungs to the rest of your body. ?This medicine may   be used for other purposes; ask your health care provider or pharmacist if you have questions. ?COMMON BRAND NAME(S): Venofer ?What should I tell my care team before I take this medication? ?They need to know if you have any of these conditions: ?Anemia not caused by low iron levels ?Heart disease ?High levels of  iron in the blood ?Kidney disease ?Liver disease ?An unusual or allergic reaction to iron, other medications, foods, dyes, or preservatives ?Pregnant or trying to get pregnant ?Breast-feeding ?How should I use this medication? ?This medication is for infusion into a vein. It is given in a hospital or clinic setting. ?Talk to your care team about the use of this medication in children. While this medication may be prescribed for children as young as 2 years for selected conditions, precautions do apply. ?Overdosage: If you think you have taken too much of this medicine contact a poison control center or emergency room at once. ?NOTE: This medicine is only for you. Do not share this medicine with others. ?What if I miss a dose? ?It is important not to miss your dose. Call your care team if you are unable to keep an appointment. ?What may interact with this medication? ?Do not take this medication with any of the following: ?Deferoxamine ?Dimercaprol ?Other iron products ?This medication may also interact with the following: ?Chloramphenicol ?Deferasirox ?This list may not describe all possible interactions. Give your health care provider a list of all the medicines, herbs, non-prescription drugs, or dietary supplements you use. Also tell them if you smoke, drink alcohol, or use illegal drugs. Some items may interact with your medicine. ?What should I watch for while using this medication? ?Visit your care team regularly. Tell your care team if your symptoms do not start to get better or if they get worse. You may need blood work done while you are taking this medication. ?You may need to follow a special diet. Talk to your care team. Foods that contain iron include: whole grains/cereals, dried fruits, beans, or peas, leafy green vegetables, and organ meats (liver, kidney). ?What side effects may I notice from receiving this medication? ?Side effects that you should report to your care team as soon as  possible: ?Allergic reactions--skin rash, itching, hives, swelling of the face, lips, tongue, or throat ?Low blood pressure--dizziness, feeling faint or lightheaded, blurry vision ?Shortness of breath ?Side effects that usually do not require medical attention (report to your care team if they continue or are bothersome): ?Flushing ?Headache ?Joint pain ?Muscle pain ?Nausea ?Pain, redness, or irritation at injection site ?This list may not describe all possible side effects. Call your doctor for medical advice about side effects. You may report side effects to FDA at 1-800-FDA-1088. ?Where should I keep my medication? ?This medication is given in a hospital or clinic and will not be stored at home. ?NOTE: This sheet is a summary. It may not cover all possible information. If you have questions about this medicine, talk to your doctor, pharmacist, or health care provider. ?? 2022 Elsevier/Gold Standard (2020-09-17 00:00:00) ? ?

## 2021-09-29 ENCOUNTER — Other Ambulatory Visit: Payer: Self-pay | Admitting: Pulmonary Disease

## 2021-09-30 ENCOUNTER — Other Ambulatory Visit: Payer: Self-pay | Admitting: Pulmonary Disease

## 2021-09-30 DIAGNOSIS — J9601 Acute respiratory failure with hypoxia: Secondary | ICD-10-CM

## 2021-09-30 DIAGNOSIS — R0602 Shortness of breath: Secondary | ICD-10-CM

## 2021-10-05 ENCOUNTER — Ambulatory Visit
Admission: RE | Admit: 2021-10-05 | Discharge: 2021-10-05 | Disposition: A | Discharge status: Home / Self Care | Payer: BC Managed Care – PPO | Source: Ambulatory Visit | Attending: Pulmonary Disease | Admitting: Pulmonary Disease

## 2021-10-05 ENCOUNTER — Encounter
Admission: RE | Admit: 2021-10-05 | Discharge: 2021-10-05 | Disposition: A | Discharge status: Home / Self Care | Payer: BC Managed Care – PPO | Source: Ambulatory Visit | Attending: Pulmonary Disease | Admitting: Pulmonary Disease

## 2021-10-05 DIAGNOSIS — R0602 Shortness of breath: Secondary | ICD-10-CM | POA: Diagnosis present

## 2021-10-05 DIAGNOSIS — J9601 Acute respiratory failure with hypoxia: Secondary | ICD-10-CM | POA: Insufficient documentation

## 2021-10-05 MED ORDER — TECHNETIUM TO 99M ALBUMIN AGGREGATED
4.0000 | Freq: Once | INTRAVENOUS | Status: AC
  Administered 2021-10-05: 4.14 via INTRAVENOUS

## 2021-10-10 ENCOUNTER — Inpatient Hospital Stay: Payer: BC Managed Care – PPO | Attending: Oncology

## 2021-10-10 DIAGNOSIS — O280 Abnormal hematological finding on antenatal screening of mother: Secondary | ICD-10-CM

## 2021-10-10 DIAGNOSIS — Z85828 Personal history of other malignant neoplasm of skin: Secondary | ICD-10-CM | POA: Diagnosis not present

## 2021-10-10 DIAGNOSIS — Z87891 Personal history of nicotine dependence: Secondary | ICD-10-CM | POA: Insufficient documentation

## 2021-10-10 DIAGNOSIS — K219 Gastro-esophageal reflux disease without esophagitis: Secondary | ICD-10-CM | POA: Insufficient documentation

## 2021-10-10 DIAGNOSIS — R0602 Shortness of breath: Secondary | ICD-10-CM | POA: Insufficient documentation

## 2021-10-10 DIAGNOSIS — N1832 Chronic kidney disease, stage 3b: Secondary | ICD-10-CM | POA: Insufficient documentation

## 2021-10-10 DIAGNOSIS — E611 Iron deficiency: Secondary | ICD-10-CM | POA: Diagnosis not present

## 2021-10-10 DIAGNOSIS — Z888 Allergy status to other drugs, medicaments and biological substances status: Secondary | ICD-10-CM | POA: Insufficient documentation

## 2021-10-10 DIAGNOSIS — I129 Hypertensive chronic kidney disease with stage 1 through stage 4 chronic kidney disease, or unspecified chronic kidney disease: Secondary | ICD-10-CM | POA: Insufficient documentation

## 2021-10-10 DIAGNOSIS — K589 Irritable bowel syndrome without diarrhea: Secondary | ICD-10-CM | POA: Insufficient documentation

## 2021-10-10 DIAGNOSIS — Z7901 Long term (current) use of anticoagulants: Secondary | ICD-10-CM | POA: Insufficient documentation

## 2021-10-10 DIAGNOSIS — E1122 Type 2 diabetes mellitus with diabetic chronic kidney disease: Secondary | ICD-10-CM | POA: Diagnosis not present

## 2021-10-10 DIAGNOSIS — F32A Depression, unspecified: Secondary | ICD-10-CM | POA: Diagnosis not present

## 2021-10-10 DIAGNOSIS — Z885 Allergy status to narcotic agent status: Secondary | ICD-10-CM | POA: Insufficient documentation

## 2021-10-10 DIAGNOSIS — N400 Enlarged prostate without lower urinary tract symptoms: Secondary | ICD-10-CM | POA: Diagnosis not present

## 2021-10-10 DIAGNOSIS — D631 Anemia in chronic kidney disease: Secondary | ICD-10-CM | POA: Insufficient documentation

## 2021-10-10 DIAGNOSIS — K5520 Angiodysplasia of colon without hemorrhage: Secondary | ICD-10-CM | POA: Diagnosis not present

## 2021-10-10 DIAGNOSIS — E538 Deficiency of other specified B group vitamins: Secondary | ICD-10-CM | POA: Insufficient documentation

## 2021-10-10 DIAGNOSIS — G894 Chronic pain syndrome: Secondary | ICD-10-CM | POA: Diagnosis not present

## 2021-10-10 DIAGNOSIS — J449 Chronic obstructive pulmonary disease, unspecified: Secondary | ICD-10-CM | POA: Diagnosis not present

## 2021-10-10 DIAGNOSIS — Z79899 Other long term (current) drug therapy: Secondary | ICD-10-CM | POA: Insufficient documentation

## 2021-10-10 DIAGNOSIS — R079 Chest pain, unspecified: Secondary | ICD-10-CM | POA: Diagnosis not present

## 2021-10-10 DIAGNOSIS — D500 Iron deficiency anemia secondary to blood loss (chronic): Secondary | ICD-10-CM

## 2021-10-10 DIAGNOSIS — Z8616 Personal history of COVID-19: Secondary | ICD-10-CM | POA: Diagnosis not present

## 2021-10-10 DIAGNOSIS — R5383 Other fatigue: Secondary | ICD-10-CM

## 2021-10-10 LAB — IRON AND TIBC
Iron: 68 ug/dL (ref 45–182)
Saturation Ratios: 22 % (ref 17.9–39.5)
TIBC: 315 ug/dL (ref 250–450)
UIBC: 247 ug/dL

## 2021-10-10 LAB — CBC WITH DIFFERENTIAL/PLATELET
Abs Immature Granulocytes: 0.07 10*3/uL (ref 0.00–0.07)
Basophils Absolute: 0 10*3/uL (ref 0.0–0.1)
Basophils Relative: 0 %
Eosinophils Absolute: 0.1 10*3/uL (ref 0.0–0.5)
Eosinophils Relative: 1 %
HCT: 39.9 % (ref 39.0–52.0)
Hemoglobin: 13 g/dL (ref 13.0–17.0)
Immature Granulocytes: 1 %
Lymphocytes Relative: 25 %
Lymphs Abs: 2.7 10*3/uL (ref 0.7–4.0)
MCH: 33.5 pg (ref 26.0–34.0)
MCHC: 32.6 g/dL (ref 30.0–36.0)
MCV: 102.8 fL — ABNORMAL HIGH (ref 80.0–100.0)
Monocytes Absolute: 0.7 10*3/uL (ref 0.1–1.0)
Monocytes Relative: 6 %
Neutro Abs: 7.3 10*3/uL (ref 1.7–7.7)
Neutrophils Relative %: 67 %
Platelets: 271 10*3/uL (ref 150–400)
RBC: 3.88 MIL/uL — ABNORMAL LOW (ref 4.22–5.81)
RDW: 14.9 % (ref 11.5–15.5)
WBC: 11 10*3/uL — ABNORMAL HIGH (ref 4.0–10.5)
nRBC: 0 % (ref 0.0–0.2)

## 2021-10-10 LAB — FERRITIN: Ferritin: 177 ng/mL (ref 24–336)

## 2021-10-10 LAB — VITAMIN B12: Vitamin B-12: 301 pg/mL (ref 180–914)

## 2021-10-12 ENCOUNTER — Inpatient Hospital Stay (HOSPITAL_BASED_OUTPATIENT_CLINIC_OR_DEPARTMENT_OTHER): Payer: BC Managed Care – PPO | Admitting: Oncology

## 2021-10-12 ENCOUNTER — Inpatient Hospital Stay: Payer: BC Managed Care – PPO

## 2021-10-12 ENCOUNTER — Encounter: Payer: Self-pay | Admitting: Oncology

## 2021-10-12 VITALS — BP 124/58 | HR 67 | Temp 97.0°F | Ht 72.0 in | Wt 153.0 lb

## 2021-10-12 VITALS — BP 130/60 | HR 69

## 2021-10-12 DIAGNOSIS — D631 Anemia in chronic kidney disease: Secondary | ICD-10-CM

## 2021-10-12 DIAGNOSIS — E611 Iron deficiency: Secondary | ICD-10-CM

## 2021-10-12 DIAGNOSIS — O280 Abnormal hematological finding on antenatal screening of mother: Secondary | ICD-10-CM

## 2021-10-12 DIAGNOSIS — N1832 Chronic kidney disease, stage 3b: Secondary | ICD-10-CM

## 2021-10-12 DIAGNOSIS — D500 Iron deficiency anemia secondary to blood loss (chronic): Secondary | ICD-10-CM

## 2021-10-12 DIAGNOSIS — I129 Hypertensive chronic kidney disease with stage 1 through stage 4 chronic kidney disease, or unspecified chronic kidney disease: Secondary | ICD-10-CM | POA: Diagnosis not present

## 2021-10-12 MED ORDER — B-12 2500 MCG SL SUBL
1.0000 | SUBLINGUAL_TABLET | Freq: Every day | SUBLINGUAL | 1 refills | Status: AC
Start: 2021-10-12 — End: ?

## 2021-10-12 MED ORDER — IRON SUCROSE 20 MG/ML IV SOLN
200.0000 mg | Freq: Once | INTRAVENOUS | Status: AC
  Administered 2021-10-12: 200 mg via INTRAVENOUS

## 2021-10-12 MED ORDER — SODIUM CHLORIDE 0.9 % IV SOLN: 200.0000 mg | Freq: Once | INTRAVENOUS | Status: DC

## 2021-10-12 MED ORDER — CYANOCOBALAMIN 1000 MCG/ML IJ SOLN
1000.0000 ug | Freq: Once | INTRAMUSCULAR | Status: AC
  Administered 2021-10-12: 1000 ug via INTRAMUSCULAR
  Filled 2021-10-12: qty 1

## 2021-10-12 MED ORDER — SODIUM CHLORIDE 0.9 % IV SOLN
INTRAVENOUS | Status: DC | PRN
  Filled 2021-10-12: qty 250

## 2021-10-12 NOTE — Progress Notes (Signed)
Hematology/Oncology Consult note [phone] [phone]         Patient Care Team: [name], MD as PCP - General (Family Medicine)  REFERRING PROVIDER: [name], MD  CHIEF COMPLAINTS/REASON FOR VISIT:  Follow up for anemia  HISTORY OF PRESENTING ILLNESS:   Riley Martin is a  76 y.o.  male with PMH listed below was seen in consultation at the request of  [name], MD  for evaluation of anemia.   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  Hospitalized from 05/10/21 - 05/13/21 due to dizziness/lightlessness, acute drop of hemoglobin to 5.7.  He was transfused multiple units of PRBCs during his hospitalization, and IV iron.  He underwent EGD- which is not remarkable.  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 His Xarelto was held during admission and resumed.  He continues to feel week and was referred to hematology for further evaluation of anemia.  Accompanied by wife. Appetite is good.    INTERVAL HISTORY Riley Martin is a 76 y.o. male who has above history reviewed by me today presents for follow up visit for anemia.  He has had IV venofer treatments. Tolerated well.  Still have fatigue.     Review of Systems  Constitutional:  Positive for fatigue. Negative for appetite change, chills, fever and unexpected weight change.  HENT:   Negative for hearing loss and voice change.   Eyes:  Negative for eye problems and icterus.  Respiratory:  Positive for shortness of breath. Negative for chest tightness and cough.   Cardiovascular:  Negative for chest pain and leg swelling.  Gastrointestinal:  Negative for abdominal distention and abdominal pain.  Endocrine: Negative for hot flashes.  Genitourinary:  Negative for difficulty  urinating, dysuria and frequency.   Musculoskeletal:  Negative for arthralgias.  Skin:  Negative for itching and rash.  Neurological:  Negative for light-headedness and numbness.  Hematological:  Negative for adenopathy. Does not bruise/bleed easily.  Psychiatric/Behavioral:  Negative for confusion.    MEDICAL HISTORY:  Past Medical History:  Diagnosis Date   Arthritis    BPH (benign prostatic hyperplasia)    Cancer (HCC)    skin cancer   Chronic pain    COPD (chronic obstructive pulmonary disease) (HCC)    COVID    GERD (gastroesophageal reflux disease)    Headache    History of insomnia    IBS (irritable bowel syndrome)    Iron deficiency anemia due to chronic blood loss 07/12/2021   Orthostatic dizziness    PONV (postoperative nausea and vomiting)    Spinal stenosis    Torn rotator cuff    left    SURGICAL HISTORY: Past Surgical History:  Procedure Laterality Date   COLONOSCOPY     COLONOSCOPY WITH PROPOFOL N/A 05/13/2021   Procedure: COLONOSCOPY WITH PROPOFOL;  Surgeon: Jonathon Bellows, MD;  Location: [facility] ENDOSCOPY;  Service: Gastroenterology;  Laterality: N/A;   ESOPHAGOGASTRODUODENOSCOPY N/A 03/14/2021   Procedure: ESOPHAGOGASTRODUODENOSCOPY (EGD);  Surgeon: [name], DO;  Location: [facility] ENDOSCOPY;  Service: Gastroenterology;  Laterality: N/A;   ESOPHAGOGASTRODUODENOSCOPY N/A 05/13/2021   Procedure: ESOPHAGOGASTRODUODENOSCOPY (EGD);  Surgeon: Jonathon Bellows, MD;  Location: [facility] ENDOSCOPY;  Service: Gastroenterology;  Laterality: N/A;   EXCISION CHONCHA BULLOSA Bilateral 06/02/2015   Procedure: BILATERAL CHONCHA BULLOSA;  Surgeon: Carloyn Manner, MD;  Location: [place];  Service: ENT;  Laterality: Bilateral;   HERNIA REPAIR     MAXILLARY ANTROSTOMY Bilateral 06/02/2015   Procedure: MAXILLARY ANTROSTOMY;  Surgeon: Carloyn Manner, MD;  Location: [place];  Service: ENT;  Laterality: Bilateral;   NASAL POLYP SURGERY     NECK SURGERY   05/08/2006   no limitations   UPPER GASTROINTESTINAL ENDOSCOPY      SOCIAL HISTORY: Social History   Socioeconomic History   Marital status: Married    Spouse name: Not on file   Number of children: Not on file   Years of education: Not on file   Highest education level: Not on file  Occupational History   Not on file  Tobacco Use   Smoking status: Former    Packs/day: 1.00    Years: 57.00    Pack years: 57.00    Types: Cigarettes    Quit date: 2020    Years since quitting: 3.4   Smokeless tobacco: Never  Vaping Use   Vaping Use: Never used  Substance and Sexual Activity   Alcohol use: No   Drug use: No   Sexual activity: Not on file  Other Topics Concern   Not on file  Social History Narrative   Not on file   Social Determinants of Health   Financial Resource Strain: Not on file  Food Insecurity: Not on file  Transportation Needs: Not on file  Physical Activity: Not on file  Stress: Not on file  Social Connections: Not on file  Intimate Partner Violence: Not on file    FAMILY HISTORY: History reviewed. No pertinent family history.  ALLERGIES:  is allergic to iodinated contrast media, lisinopril, tramadol, and pregabalin.  MEDICATIONS:  Current Outpatient Medications  Medication Sig Dispense Refill   albuterol (PROVENTIL) (2.5 MG/3ML) 0.083% nebulizer solution Take 3 mLs by nebulization every 6 (six) hours as needed for wheezing or shortness of breath.     amoxicillin (AMOXIL) 500 MG capsule Take 500 mg by mouth 3 (three) times daily.     apixaban (ELIQUIS) 2.5 MG TABS tablet Take by mouth.     blood glucose meter kit and supplies KIT Dispense based on patient and insurance preference. Use up to four times daily as directed. 1 each 0   budesonide (PULMICORT) 0.5 MG/2ML nebulizer solution Inhale into the lungs.     budesonide (PULMICORT) 1 MG/2ML nebulizer solution Take 1 mg by nebulization daily.     Cyanocobalamin (B-12) 2500 MCG SUBL Place 1 tablet under  the tongue daily. 90 tablet 1   ferrous sulfate 325 (65 FE) MG tablet Take 1 tablet (325 mg total) by mouth 2 (two) times daily with a meal. 60 tablet 11   fluticasone (FLONASE) 50 MCG/ACT nasal spray SPRAY 2 SPRAYS INTO EACH NOSTRIL EVERY DAY 16 g 0   hydrocortisone 2.5 % cream Apply topically.     hyoscyamine (LEVSIN SL) 0.125 MG SL tablet Place 0.125 mg under the tongue every 4 (four) hours as needed.     ipratropium (ATROVENT) 0.02 % nebulizer solution Take 0.5 mg by nebulization 2 (two) times daily.     levalbuterol (XOPENEX HFA) 45 MCG/ACT inhaler Inhale 2 puffs into the lungs every 4 (four) hours as needed for wheezing.     metoprolol tartrate (LOPRESSOR) 50 MG tablet Take 50 mg by mouth daily.     mirtazapine (REMERON) 15 MG tablet Take 15 mg by mouth at bedtime.     mometasone-formoterol (DULERA) 200-5 MCG/ACT AERO Inhale 2 puffs into the  lungs 2 (two) times daily. 1 each 1   Multiple Vitamin (MULTIVITAMIN WITH MINERALS) TABS tablet Take 1 tablet by mouth daily.     Nutritional Supplements (FEEDING SUPPLEMENT, NEPRO CARB STEADY,) LIQD Take 237 mLs by mouth 3 (three) times daily between meals.  0   ondansetron (ZOFRAN-ODT) 4 MG disintegrating tablet Take 4 mg by mouth every 8 (eight) hours as needed for nausea or vomiting.     oxyCODONE-acetaminophen (PERCOCET) 10-325 MG tablet Take 1 tablet by mouth 5 (five) times daily as needed.     OXYCONTIN 10 MG 12 hr tablet Take 10 mg by mouth 3 (three) times daily.     pantoprazole (PROTONIX) 20 MG tablet Take 20 mg by mouth daily.     Rivaroxaban (XARELTO) 15 MG TABS tablet Take 1 tablet (15 mg total) by mouth daily with supper. Hold for 3 days 42 tablet 1   sodium chloride (OCEAN) 0.65 % SOLN nasal spray Place 1 spray into both nostrils as needed for congestion. 30 mL 1   spironolactone (ALDACTONE) 25 MG tablet Take 25 mg by mouth daily.     tamsulosin (FLOMAX) 0.4 MG CAPS capsule Take 0.4 mg by mouth daily.     tiotropium (SPIRIVA) 18 MCG  inhalation capsule Place 1 capsule (18 mcg total) into inhaler and inhale daily. 30 capsule 1   VENTOLIN HFA 108 (90 Base) MCG/ACT inhaler Inhale 2 puffs into the lungs every 6 (six) hours as needed.     amiodarone (PACERONE) 200 MG tablet Take 1 tablet (200 mg total) by mouth 2 (two) times daily. (Patient not taking: Reported on 10/12/2021) 60 tablet 1   furosemide (LASIX) 20 MG tablet Take 1 tablet (20 mg total) by mouth 2 (two) times daily. Resume it after checking kidney function and after discussing with primary care doctor (Patient not taking: Reported on 07/12/2021) 30 tablet    Insulin Pen Needle (PEN NEEDLES) 31G X 6 MM MISC 1 each by Does not apply route 3 (three) times daily with meals. (Patient not taking: Reported on 07/12/2021) 100 each 0   meclizine (ANTIVERT) 12.5 MG tablet Take 12.5 mg by mouth 3 (three) times daily as needed for dizziness. (Patient not taking: Reported on 07/12/2021)     megestrol (MEGACE ES) 625 MG/5ML suspension Take 625 mg by mouth daily. (Patient not taking: Reported on 07/12/2021)     No current facility-administered medications for this visit.     PHYSICAL EXAMINATION: ECOG PERFORMANCE STATUS: 2 - Symptomatic, <50% confined to bed Vitals:   10/12/21 1411  BP: (!) 124/58  Pulse: 67  Temp: (!) 97 F (36.1 C)  SpO2: 97%   Filed Weights   10/12/21 1411  Weight: 153 lb (69.4 kg)    Physical Exam Constitutional:      General: He is not in acute distress.    Comments: Patient sits in the wheelchair.   HENT:     Head: Normocephalic and atraumatic.  Eyes:     General: No scleral icterus. Cardiovascular:     Rate and Rhythm: Normal rate and regular rhythm.     Heart sounds: Normal heart sounds.  Pulmonary:     Effort: Pulmonary effort is normal. No respiratory distress.     Breath sounds: No wheezing.     Comments: Severely decreased breath sound.  Abdominal:     General: Bowel sounds are normal. There is no distension.     Palpations: Abdomen is soft.   Musculoskeletal:  General: No deformity. Normal range of motion.     Cervical back: Normal range of motion and neck supple.  Skin:    General: Skin is warm and dry.     Coloration: Skin is pale.     Findings: No erythema or rash.  Neurological:     Mental Status: He is alert and oriented to person, place, and time. Mental status is at baseline.     Cranial Nerves: No cranial nerve deficit.     Coordination: Coordination normal.  Psychiatric:        Mood and Affect: Mood normal.    LABORATORY DATA:  I have reviewed the data as listed Lab Results  Component Value Date   WBC 11.0 (H) 10/10/2021   HGB 13.0 10/10/2021   HCT 39.9 10/10/2021   MCV 102.8 (H) 10/10/2021   PLT 271 10/10/2021   Recent Labs    12/28/20 0557 12/29/20 0541 05/10/21 1127 05/12/21 0148 05/13/21 0716 07/12/21 1555  NA 138 137   < > 140 141 133*  K 4.4 4.5   < > 4.7 4.2 5.0  CL 99 99   < > 107 109 101  CO2 31 28   < > _0 GLUCOSE 108* 100*   < > 91 90 103*  BUN 56* 52*   < > 30* 18 41*  CREATININE 1.77* 1.75*   < > 1.98* 2.03* 2.00*  CALCIUM 8.4* 8.4*   < > 8.6* 8.6* 8.7*  GFRNONAA 39* 40*   < > 34* 33* 34*  PROT 6.0* 5.9*  --   --   --  7.1  ALBUMIN 3.0* 3.1*  --   --   --  3.8  AST 69* 73*  --   --   --  18  ALT 109* 144*  --   --   --  21  ALKPHOS 37* 39  --   --   --  42  BILITOT 0.6 0.8  --   --   --  0.3   < > = values in this interval not displayed.    Iron/TIBC/Ferritin/ %Sat    Component Value Date/Time   IRON 68 10/10/2021 1421   TIBC 315 10/10/2021 1421   FERRITIN 177 10/10/2021 1421   IRONPCTSAT 22 10/10/2021 1421      RADIOGRAPHIC STUDIES: I have personally reviewed the radiological images as listed and agreed with the findings in the report. DG Chest 2 View  Result Date: 10/05/2021 CLINICAL DATA:  Shortness of breath, acute hypoxic respiratory failure EXAM: CHEST - 2 VIEW COMPARISON:  Chest radiograph 12/27/2020 FINDINGS: The cardiomediastinal silhouette is  normal The lungs hyperinflated with flattening of the diaphragms suggesting underlying COPD. There is no focal consolidation or pulmonary edema. There is no pleural effusion or pneumothorax. There is no acute osseous abnormality. IMPRESSION: COPD.  No radiographic evidence of acute cardiopulmonary process. Electronically Signed   By: Valetta Mole M.D.   On: 10/05/2021 13:36   NM Pulmonary Perfusion  Result Date: 10/05/2021 CLINICAL DATA:  Hypoxemia respiratory failure, shortness of breath for 8 months since COVID-19 infection, mild chest pain EXAM: NUCLEAR MEDICINE PERFUSION LUNG SCAN TECHNIQUE: Perfusion images were obtained in multiple projections after intravenous injection of radiopharmaceutical. Ventilation scans intentionally deferred if perfusion scan and chest x-ray adequate for interpretation during COVID 19 epidemic. RADIOPHARMACEUTICALS:  4.14 mCi Tc-46mMAA IV COMPARISON:  Chest radiograph 10/05/2021 FINDINGS: Nonsegmental diminished perfusion in the periphery of the LEFT lung fairly diffusely and  at the RIGHT apex. These areas correspond to sites of advanced COPD on chest radiograph particularly in LEFT upper lobe. No segmental or subsegmental perfusion defects identified to suggest pulmonary embolism. IMPRESSION: Pulmonary embolism absent. Nonsegmental diminished peripheral perfusion in LEFT > RIGHT upper lobes consistent with COPD changes as identified on chest radiography. Electronically Signed   By: Lavonia Dana M.D.   On: 10/05/2021 14:39      ASSESSMENT & PLAN:  1. Anemia of chronic renal failure, stage 3b ([place])   2. Iron deficiency anemia due to chronic blood loss   3. Low maternal serum vitamin B12    #Iron deficiency anemia, anemia secondary to chronic kidney disease. Labs reviewed and discussed with patient.  Hemoglobin has normalized to 13.  Iron panel has improved with a ferritin of 177 and iron saturation of 22. In the context of chronic kidney disease, recommend to further  increase his iron level with goal ferritin above 200.  Patient agrees with the plan.  Proceed with IV Venofer today.  #Vitamin B12 deficiency, proceed with B12 injection today.  B12 level has improved.  Recommend patient to take sublingual vitamin B12 2500 mcg daily.  Orders Placed This Encounter  Procedures   CBC with Differential/Platelet    Standing Status:   Future    Standing Expiration Date:   10/13/2022   Ferritin    Standing Status:   Future    Standing Expiration Date:   10/13/2022   Iron and TIBC    Standing Status:   Future    Standing Expiration Date:   10/13/2022   Vitamin B12    Standing Status:   Future    Standing Expiration Date:   10/13/2022    All questions were answered. The patient knows to call the clinic with any problems questions or concerns.  cc [name], MD    Return of visit: 6 months lab MD +/- Venofer/ B12 injection.    Earlie Server, MD, PhD [facility] Hematology Oncology 10/12/2021

## 2021-12-19 ENCOUNTER — Other Ambulatory Visit: Payer: Self-pay | Admitting: Pulmonary Disease

## 2021-12-19 DIAGNOSIS — J4540 Moderate persistent asthma, uncomplicated: Secondary | ICD-10-CM

## 2021-12-19 DIAGNOSIS — J9601 Acute respiratory failure with hypoxia: Secondary | ICD-10-CM

## 2022-03-09 ENCOUNTER — Other Ambulatory Visit: Payer: Self-pay | Admitting: Otolaryngology

## 2022-03-09 DIAGNOSIS — R42 Dizziness and giddiness: Secondary | ICD-10-CM

## 2022-03-09 DIAGNOSIS — H9202 Otalgia, left ear: Secondary | ICD-10-CM

## 2022-03-09 DIAGNOSIS — H9042 Sensorineural hearing loss, unilateral, left ear, with unrestricted hearing on the contralateral side: Secondary | ICD-10-CM

## 2022-04-12 ENCOUNTER — Inpatient Hospital Stay: Payer: BC Managed Care – PPO | Attending: Oncology

## 2022-04-13 ENCOUNTER — Inpatient Hospital Stay: Payer: BC Managed Care – PPO

## 2022-04-13 ENCOUNTER — Inpatient Hospital Stay: Payer: BC Managed Care – PPO | Admitting: Oncology

## 2022-04-13 MED FILL — Iron Sucrose Inj 20 MG/ML (Fe Equiv): INTRAVENOUS | Qty: 10 | Status: AC

## 2022-04-14 ENCOUNTER — Ambulatory Visit: Payer: BC Managed Care – PPO

## 2022-04-17 ENCOUNTER — Ambulatory Visit: Payer: BC Managed Care – PPO | Admitting: Oncology

## 2022-04-19 ENCOUNTER — Encounter: Payer: Self-pay | Admitting: Otolaryngology

## 2022-04-25 ENCOUNTER — Other Ambulatory Visit: Payer: Self-pay | Admitting: Otolaryngology

## 2022-04-25 ENCOUNTER — Ambulatory Visit
Admission: RE | Admit: 2022-04-25 | Discharge: 2022-04-25 | Disposition: A | Discharge status: Home / Self Care | Payer: BC Managed Care – PPO | Source: Ambulatory Visit | Attending: Otolaryngology | Admitting: Otolaryngology

## 2022-04-25 DIAGNOSIS — R42 Dizziness and giddiness: Secondary | ICD-10-CM

## 2022-04-25 DIAGNOSIS — H9202 Otalgia, left ear: Secondary | ICD-10-CM

## 2022-04-25 DIAGNOSIS — H9042 Sensorineural hearing loss, unilateral, left ear, with unrestricted hearing on the contralateral side: Secondary | ICD-10-CM

## 2022-04-25 MED ORDER — GADOBENATE DIMEGLUMINE 529 MG/ML IV SOLN
14.0000 mL | Freq: Once | INTRAVENOUS | Status: AC | PRN
  Administered 2022-04-25: 14 mL via INTRAVENOUS

## 2022-05-01 ENCOUNTER — Inpatient Hospital Stay
Admission: EM | Admit: 2022-05-01 | Discharge: 2022-05-04 | DRG: 871 | Disposition: A | Discharge status: Home / Self Care | Payer: BC Managed Care – PPO | Attending: Internal Medicine | Admitting: Internal Medicine

## 2022-05-01 ENCOUNTER — Emergency Department: Payer: BC Managed Care – PPO

## 2022-05-01 DIAGNOSIS — G9341 Metabolic encephalopathy: Secondary | ICD-10-CM | POA: Diagnosis present

## 2022-05-01 DIAGNOSIS — Z87891 Personal history of nicotine dependence: Secondary | ICD-10-CM

## 2022-05-01 DIAGNOSIS — J440 Chronic obstructive pulmonary disease with acute lower respiratory infection: Secondary | ICD-10-CM | POA: Diagnosis present

## 2022-05-01 DIAGNOSIS — Z794 Long term (current) use of insulin: Secondary | ICD-10-CM | POA: Diagnosis not present

## 2022-05-01 DIAGNOSIS — Z7901 Long term (current) use of anticoagulants: Secondary | ICD-10-CM

## 2022-05-01 DIAGNOSIS — Z85828 Personal history of other malignant neoplasm of skin: Secondary | ICD-10-CM | POA: Diagnosis not present

## 2022-05-01 DIAGNOSIS — E1122 Type 2 diabetes mellitus with diabetic chronic kidney disease: Secondary | ICD-10-CM | POA: Diagnosis present

## 2022-05-01 DIAGNOSIS — I4510 Unspecified right bundle-branch block: Secondary | ICD-10-CM | POA: Diagnosis present

## 2022-05-01 DIAGNOSIS — M549 Dorsalgia, unspecified: Secondary | ICD-10-CM | POA: Diagnosis present

## 2022-05-01 DIAGNOSIS — G8929 Other chronic pain: Secondary | ICD-10-CM | POA: Diagnosis present

## 2022-05-01 DIAGNOSIS — J9621 Acute and chronic respiratory failure with hypoxia: Secondary | ICD-10-CM | POA: Diagnosis present

## 2022-05-01 DIAGNOSIS — D500 Iron deficiency anemia secondary to blood loss (chronic): Secondary | ICD-10-CM | POA: Diagnosis present

## 2022-05-01 DIAGNOSIS — Z8616 Personal history of COVID-19: Secondary | ICD-10-CM | POA: Diagnosis not present

## 2022-05-01 DIAGNOSIS — J101 Influenza due to other identified influenza virus with other respiratory manifestations: Secondary | ICD-10-CM | POA: Diagnosis present

## 2022-05-01 DIAGNOSIS — D631 Anemia in chronic kidney disease: Secondary | ICD-10-CM | POA: Diagnosis present

## 2022-05-01 DIAGNOSIS — J9611 Chronic respiratory failure with hypoxia: Secondary | ICD-10-CM

## 2022-05-01 DIAGNOSIS — M542 Cervicalgia: Secondary | ICD-10-CM | POA: Diagnosis present

## 2022-05-01 DIAGNOSIS — J189 Pneumonia, unspecified organism: Secondary | ICD-10-CM

## 2022-05-01 DIAGNOSIS — R6520 Severe sepsis without septic shock: Secondary | ICD-10-CM | POA: Diagnosis present

## 2022-05-01 DIAGNOSIS — Z888 Allergy status to other drugs, medicaments and biological substances status: Secondary | ICD-10-CM

## 2022-05-01 DIAGNOSIS — Z1152 Encounter for screening for COVID-19: Secondary | ICD-10-CM

## 2022-05-01 DIAGNOSIS — F32A Depression, unspecified: Secondary | ICD-10-CM | POA: Diagnosis present

## 2022-05-01 DIAGNOSIS — N400 Enlarged prostate without lower urinary tract symptoms: Secondary | ICD-10-CM | POA: Diagnosis present

## 2022-05-01 DIAGNOSIS — J09X1 Influenza due to identified novel influenza A virus with pneumonia: Secondary | ICD-10-CM | POA: Diagnosis not present

## 2022-05-01 DIAGNOSIS — K589 Irritable bowel syndrome without diarrhea: Secondary | ICD-10-CM | POA: Diagnosis present

## 2022-05-01 DIAGNOSIS — A4189 Other specified sepsis: Principal | ICD-10-CM | POA: Diagnosis present

## 2022-05-01 DIAGNOSIS — R4182 Altered mental status, unspecified: Secondary | ICD-10-CM

## 2022-05-01 DIAGNOSIS — Z7951 Long term (current) use of inhaled steroids: Secondary | ICD-10-CM

## 2022-05-01 DIAGNOSIS — K219 Gastro-esophageal reflux disease without esophagitis: Secondary | ICD-10-CM | POA: Diagnosis present

## 2022-05-01 DIAGNOSIS — N1832 Chronic kidney disease, stage 3b: Secondary | ICD-10-CM | POA: Diagnosis present

## 2022-05-01 DIAGNOSIS — Z91041 Radiographic dye allergy status: Secondary | ICD-10-CM

## 2022-05-01 DIAGNOSIS — E1129 Type 2 diabetes mellitus with other diabetic kidney complication: Secondary | ICD-10-CM | POA: Diagnosis present

## 2022-05-01 DIAGNOSIS — Z79891 Long term (current) use of opiate analgesic: Secondary | ICD-10-CM

## 2022-05-01 DIAGNOSIS — I482 Chronic atrial fibrillation, unspecified: Secondary | ICD-10-CM | POA: Diagnosis present

## 2022-05-01 DIAGNOSIS — I10 Essential (primary) hypertension: Secondary | ICD-10-CM | POA: Diagnosis present

## 2022-05-01 DIAGNOSIS — Z9981 Dependence on supplemental oxygen: Secondary | ICD-10-CM | POA: Diagnosis not present

## 2022-05-01 DIAGNOSIS — J449 Chronic obstructive pulmonary disease, unspecified: Secondary | ICD-10-CM | POA: Diagnosis present

## 2022-05-01 DIAGNOSIS — I129 Hypertensive chronic kidney disease with stage 1 through stage 4 chronic kidney disease, or unspecified chronic kidney disease: Secondary | ICD-10-CM | POA: Diagnosis present

## 2022-05-01 DIAGNOSIS — Z79899 Other long term (current) drug therapy: Secondary | ICD-10-CM

## 2022-05-01 DIAGNOSIS — A419 Sepsis, unspecified organism: Secondary | ICD-10-CM | POA: Diagnosis not present

## 2022-05-01 DIAGNOSIS — M1990 Unspecified osteoarthritis, unspecified site: Secondary | ICD-10-CM | POA: Diagnosis present

## 2022-05-01 DIAGNOSIS — J1001 Influenza due to other identified influenza virus with the same other identified influenza virus pneumonia: Secondary | ICD-10-CM | POA: Diagnosis present

## 2022-05-01 DIAGNOSIS — R509 Fever, unspecified: Principal | ICD-10-CM

## 2022-05-01 LAB — COMPREHENSIVE METABOLIC PANEL
ALT: 19 U/L (ref 0–44)
AST: 26 U/L (ref 15–41)
Albumin: 3.5 g/dL (ref 3.5–5.0)
Alkaline Phosphatase: 41 U/L (ref 38–126)
Anion gap: 11 (ref 5–15)
BUN: 31 mg/dL — ABNORMAL HIGH (ref 8–23)
CO2: 27 mmol/L (ref 22–32)
Calcium: 8.7 mg/dL — ABNORMAL LOW (ref 8.9–10.3)
Chloride: 100 mmol/L (ref 98–111)
Creatinine, Ser: 2.18 mg/dL — ABNORMAL HIGH (ref 0.61–1.24)
GFR, Estimated: 30 mL/min — ABNORMAL LOW (ref >60)
Glucose, Bld: 104 mg/dL — ABNORMAL HIGH (ref 70–99)
Potassium: 3.9 mmol/L (ref 3.5–5.1)
Sodium: 138 mmol/L (ref 135–145)
Total Bilirubin: 1.1 mg/dL (ref 0.3–1.2)
Total Protein: 6.6 g/dL (ref 6.5–8.1)

## 2022-05-01 LAB — PROTIME-INR
INR: 1.2 (ref 0.8–1.2)
Prothrombin Time: 15.2 seconds (ref 11.4–15.2)

## 2022-05-01 LAB — CBC WITH DIFFERENTIAL/PLATELET
Abs Immature Granulocytes: 0.03 10*3/uL (ref 0.00–0.07)
Basophils Absolute: 0 10*3/uL (ref 0.0–0.1)
Basophils Relative: 0 %
Eosinophils Absolute: 0 10*3/uL (ref 0.0–0.5)
Eosinophils Relative: 0 %
HCT: 34.5 % — ABNORMAL LOW (ref 39.0–52.0)
Hemoglobin: 11 g/dL — ABNORMAL LOW (ref 13.0–17.0)
Immature Granulocytes: 0 %
Lymphocytes Relative: 5 %
Lymphs Abs: 0.4 10*3/uL — ABNORMAL LOW (ref 0.7–4.0)
MCH: 33.6 pg (ref 26.0–34.0)
MCHC: 31.9 g/dL (ref 30.0–36.0)
MCV: 105.5 fL — ABNORMAL HIGH (ref 80.0–100.0)
Monocytes Absolute: 0.4 10*3/uL (ref 0.1–1.0)
Monocytes Relative: 6 %
Neutro Abs: 6.1 10*3/uL (ref 1.7–7.7)
Neutrophils Relative %: 89 %
Platelets: 120 10*3/uL — ABNORMAL LOW (ref 150–400)
RBC: 3.27 MIL/uL — ABNORMAL LOW (ref 4.22–5.81)
RDW: 12.6 % (ref 11.5–15.5)
WBC: 6.9 10*3/uL (ref 4.0–10.5)
nRBC: 0 % (ref 0.0–0.2)

## 2022-05-01 LAB — RESP PANEL BY RT-PCR (RSV, FLU A&B, COVID)  RVPGX2
Influenza A by PCR: POSITIVE — AB
Influenza B by PCR: NEGATIVE
Resp Syncytial Virus by PCR: NEGATIVE
SARS Coronavirus 2 by RT PCR: NEGATIVE

## 2022-05-01 LAB — LACTIC ACID, PLASMA: Lactic Acid, Venous: 1.1 mmol/L (ref 0.5–1.9)

## 2022-05-01 MED ORDER — ACETAMINOPHEN 500 MG PO TABS
1000.0000 mg | ORAL_TABLET | Freq: Once | ORAL | Status: AC
  Administered 2022-05-01: 1000 mg via ORAL
  Filled 2022-05-01: qty 2

## 2022-05-01 MED ORDER — OSELTAMIVIR PHOSPHATE 30 MG PO CAPS
30.0000 mg | ORAL_CAPSULE | Freq: Two times a day (BID) | ORAL | Status: DC
  Administered 2022-05-02 – 2022-05-04 (×6): 30 mg via ORAL
  Filled 2022-05-01 (×8): qty 1

## 2022-05-01 MED ORDER — SODIUM CHLORIDE 0.9 % IV BOLUS
2000.0000 mL | Freq: Once | INTRAVENOUS | Status: AC
  Administered 2022-05-01: 2000 mL via INTRAVENOUS

## 2022-05-01 MED ORDER — SODIUM CHLORIDE 0.9 % IV SOLN
500.0000 mg | Freq: Once | INTRAVENOUS | Status: AC
  Administered 2022-05-02: 500 mg via INTRAVENOUS
  Filled 2022-05-01: qty 5

## 2022-05-01 MED ORDER — SODIUM CHLORIDE 0.9 % IV SOLN
1.0000 g | Freq: Once | INTRAVENOUS | Status: AC
  Administered 2022-05-02: 1 g via INTRAVENOUS
  Filled 2022-05-01: qty 10

## 2022-05-01 NOTE — Assessment & Plan Note (Signed)
Continue home metoprolol and spironolactone

## 2022-05-01 NOTE — ED Provider Notes (Addendum)
[facility] Provider Note    Event Date/Time   First MD Initiated Contact with Patient 05/01/22 2213     (approximate)   History   Altered Mental Status and Fever   HPI  Riley Martin is a 76 y.o. male   Past medical history of COPD on 4 L baseline, iron deficient anemia, chronic pain, CKD, atrial fibrillation on anticoagulation, who presents to the emergency department with several days of flulike symptoms including myalgias, headache, dry cough, shortness of breath, chest pain.  Denies abdominal pain nausea or vomiting or GU symptoms.  History was obtained via the patient.  I reviewed external medical notes including oncology note in June 2023 which states the patient has severe COPD on 4 to 6 L oxygen baseline      Physical Exam   Triage Vital Signs: ED Triage Vitals  Enc Vitals Group     BP 05/01/22 2158 130/63     Pulse Rate 05/01/22 2158 (!) 109     Resp 05/01/22 2158 (!) 32     Temp 05/01/22 2158 (!) 102.6 F (39.2 C)     Temp src --      SpO2 05/01/22 2158 99 %     Weight 05/01/22 2159 160 lb (72.6 kg)     Height --      Head Circumference --      Peak Flow --      Pain Score --      Pain Loc --      Pain Edu? --      Excl. in [place]? --     Most recent vital signs: Vitals:   05/01/22 2158  BP: 130/63  Pulse: (!) 109  Resp: (!) 32  Temp: (!) 102.6 F (39.2 C)  SpO2: 99%    General: Awake, no distress.  CV:  Good peripheral perfusion.  Resp:  Normal effort.  Abd:  No distention.  Other:  Awake alert oriented to self and situation.  At times becomes distracted not answering my questions appropriately but then can be redirected, appears slightly confused.  He is febrile 102.6.  Triage vitals abnormal w tachycardia, tachypnea and fever.   He is 99% on room air but I put him back on his 4 L since he has oxygen at baseline.  His lungs are clear without focality or wheezing.  His abdomen is soft and nontender.  He appears  slightly dehydrated with dry mucous membranes and poor skin turgor.   ED Results / Procedures / Treatments   Labs (all labs ordered are listed, but only abnormal results are displayed) Labs Reviewed  COMPREHENSIVE METABOLIC PANEL - Abnormal; Notable for the following components:      Result Value   Glucose, Bld 104 (*)    BUN 31 (*)    Creatinine, Ser 2.18 (*)    Calcium 8.7 (*)    GFR, Estimated 30 (*)    All other components within normal limits  CBC WITH DIFFERENTIAL/PLATELET - Abnormal; Notable for the following components:   RBC 3.27 (*)    Hemoglobin 11.0 (*)    HCT 34.5 (*)    MCV 105.5 (*)    Platelets 120 (*)    Lymphs Abs 0.4 (*)    All other components within normal limits  CULTURE, BLOOD (ROUTINE X 2)  CULTURE, BLOOD (ROUTINE X 2)  URINE CULTURE  RESP PANEL BY RT-PCR (RSV, FLU A&B, COVID)  RVPGX2  LACTIC ACID, PLASMA  PROTIME-INR  LACTIC ACID, PLASMA  URINALYSIS, ROUTINE W REFLEX MICROSCOPIC     I reviewed labs and they are notable for his creatinine of 2.18 is not far from his baseline of 2 and his hemoglobin is 11 compared to 13 and 11.5 from earlier this year  EKG  ED ECG REPORT I, Lucillie Garfinkel, the attending physician, personally viewed and interpreted this ECG.   Date: 05/01/2022  EKG Time: 2206  Rate: 94  Rhythm: normal sinus rhythm  Axis: nl  Intervals:right bundle branch block  ST&T Change: No acute ischemic changes    RADIOLOGY I independently reviewed and interpreted cxr and see some opacities at bilateral bases   PROCEDURES:  Critical Care performed: No  .Critical Care  Performed by: Lucillie Garfinkel, MD Authorized by: Lucillie Garfinkel, MD   Critical care provider statement:    Critical care time (minutes):  30   Critical care was necessary to treat or prevent imminent or life-threatening deterioration of the following conditions:  Sepsis   Critical care was time spent personally by me on the following activities:  Development of treatment  plan with patient or surrogate, discussions with consultants, evaluation of patient's response to treatment, examination of patient, ordering and review of laboratory studies, ordering and review of radiographic studies, ordering and performing treatments and interventions, pulse oximetry, re-evaluation of patient's condition and review of old [place] ED: Medications  azithromycin (ZITHROMAX) 500 mg in sodium chloride 0.9 % 250 mL IVPB (has no administration in time range)  cefTRIAXone (ROCEPHIN) 1 g in sodium chloride 0.9 % 100 mL IVPB (has no administration in time range)  acetaminophen (TYLENOL) tablet 1,000 mg (1,000 mg Oral Given 05/01/22 2315)  sodium chloride 0.9 % bolus 2,000 mL (2,000 mLs Intravenous New Bag/Given 05/01/22 2316)    Consultants:  I spoke with hospitalist for admission regarding care plan for this patient.   IMPRESSION / MDM / ASSESSMENT AND PLAN / ED COURSE  I reviewed the triage vital signs and the nursing notes.                              Differential diagnosis includes, but is not limited to, viral URI, bacterial pneumonia, sepsis, metabolic derangement, dehydration, urinary tract infection   The patient is on the cardiac monitor to evaluate for evidence of arrhythmia and/or significant heart rate changes.  MDM: This is a patient with flulike symptoms and respiratory infectious symptoms with some questionable opacities in bilateral bases so I covered him with community-acquired pneumonia coverage as well as a sepsis bolus 30 cc/kg ideal body weight.  Patient is stable on his home O2 requirements normotensive.  Admit for pneumonia, fever, altered mental status  Patient's presentation is most consistent with acute presentation with potential threat to life or bodily function.       FINAL CLINICAL IMPRESSION(S) / ED DIAGNOSES   Final diagnoses:  Fever in adult  Community acquired pneumonia, unspecified laterality  Altered  mental status, unspecified altered mental status type     Rx / DC Orders   ED Discharge Orders     None        Note:  This document was prepared using Dragon voice recognition software and may include unintentional dictation errors.    Lucillie Garfinkel, MD 05/01/22 [place]    Lucillie Garfinkel, MD 05/01/22 2330

## 2022-05-01 NOTE — Assessment & Plan Note (Signed)
Continue OxyContin with as needed Percocet

## 2022-05-01 NOTE — Assessment & Plan Note (Signed)
Chronic respiratory failure with hypoxia O2 requirement at baseline, 4 to 6 L nasal cannula COPD not exacerbated Continue Pulmicort with DuoNebs

## 2022-05-01 NOTE — Assessment & Plan Note (Addendum)
Sepsis, severe Sepsis criteria include fever, tachycardia and tachypnea with pneumonia as well as altered mental status Continue sepsis fluids Tamiflu Patient got Rocephin and azithromycin in the ED.  Will hold off on further antibiotics pending procalcitonin Antitussives, incentive spirometry  Supportive care DuoNebs as needed

## 2022-05-01 NOTE — ED Triage Notes (Signed)
Pt arrives with ems, per ems pt with ams, pt is currently alert to self only, not safe to remain in lobby waiting. Pt with hot to touch skin, tachypnea noted. Pt states he thinks he has had a fever. Cough noted.

## 2022-05-01 NOTE — Assessment & Plan Note (Signed)
Rate controlled Continue apixaban, amiodarone and metoprolol

## 2022-05-01 NOTE — ED Notes (Signed)
Per ems pt with flu like symptoms and ams. Vitals 134/62, oxygen 98% on 5lpm, temp 102.1.

## 2022-05-01 NOTE — Assessment & Plan Note (Addendum)
Anemia of CKD 3B Renal function at baseline Hemoglobin at baseline

## 2022-05-01 NOTE — Assessment & Plan Note (Signed)
Sliding scale insulin coverage 

## 2022-05-01 NOTE — H&P (Signed)
History and Physical    Patient: Riley Martin LKJ:[id number] DOB: Jul 19, 1945 DOA: 05/01/2022 DOS: the patient was seen and examined on 05/01/2022 PCP: [name], MD  Patient coming from: Home  Chief Complaint:  Chief Complaint  Patient presents with   Altered Mental Status   Fever    HPI: Riley Martin is a 76 y.o. male with medical history significant for A fib on apixaban, hypertension, diabetes mellitus, severe COPD on 4-6 L oxygen, GERD, depression, IBS, BPH, former smoker, CKD-3B, chronic pain on chronic opiates who presents to the ED with altered mental status, fever, cough, headache, myalgias and shortness of breath.  He denied vomiting abdominal pain or diarrhea. ED course and data review: Tmax 102.6 with pulse 109 respirations 32 and O2 sat 99% on home flow rate of 4 to 6 L oxygen.  BP 130/63.  Labs with normal WBC of 6900 and lactic acid 1.1.  Hemoglobin at baseline at 11.  Creatinine also at baseline at 2.18. EKG,  Influenza A+.  Personally viewed and interpreted showing sinus rhythm at 94 with right bundle branch block and no ischemic changes.  Chest x-ray showing COPD, question superimposed edema or infection in the lower lungs. Patient was started on Rocephin and azithromycin and given a fluid bolus prior flu test result for treatment of sepsis secondary to pneumonia.  Hospitalist consulted for admission.     Past Medical History:  Diagnosis Date   Arthritis    BPH (benign prostatic hyperplasia)    Cancer (HCC)    skin cancer   Chronic pain    COPD (chronic obstructive pulmonary disease) (HCC)    COVID    GERD (gastroesophageal reflux disease)    Headache    History of insomnia    IBS (irritable bowel syndrome)    Iron deficiency anemia due to chronic blood loss 07/12/2021   Orthostatic dizziness    PONV (postoperative nausea and vomiting)    Spinal stenosis    Torn rotator cuff    left   Past Surgical History:  Procedure Laterality Date    COLONOSCOPY     COLONOSCOPY WITH PROPOFOL N/A 05/13/2021   Procedure: COLONOSCOPY WITH PROPOFOL;  Surgeon: Jonathon Bellows, MD;  Location: [facility] ENDOSCOPY;  Service: Gastroenterology;  Laterality: N/A;   ESOPHAGOGASTRODUODENOSCOPY N/A 03/14/2021   Procedure: ESOPHAGOGASTRODUODENOSCOPY (EGD);  Surgeon: [name], DO;  Location: [facility] ENDOSCOPY;  Service: Gastroenterology;  Laterality: N/A;   ESOPHAGOGASTRODUODENOSCOPY N/A 05/13/2021   Procedure: ESOPHAGOGASTRODUODENOSCOPY (EGD);  Surgeon: Jonathon Bellows, MD;  Location: [facility] ENDOSCOPY;  Service: Gastroenterology;  Laterality: N/A;   EXCISION CHONCHA BULLOSA Bilateral 06/02/2015   Procedure: BILATERAL CHONCHA BULLOSA;  Surgeon: Carloyn Manner, MD;  Location: [place];  Service: ENT;  Laterality: Bilateral;   HERNIA REPAIR     MAXILLARY ANTROSTOMY Bilateral 06/02/2015   Procedure: MAXILLARY ANTROSTOMY;  Surgeon: Carloyn Manner, MD;  Location: [place];  Service: ENT;  Laterality: Bilateral;   NASAL POLYP SURGERY     NECK SURGERY  05/08/2006   no limitations   UPPER GASTROINTESTINAL ENDOSCOPY     Social History:  reports that he quit smoking about 3 years ago. His smoking use included cigarettes. He has a 57.00 pack-year smoking history. He has never used smokeless tobacco. He reports that he does not drink alcohol and does not use drugs.  Allergies  Allergen Reactions   Iodinated Contrast Media Other (See Comments) and Nausea Only    Dry heaves, patient states he felt like he was on  fire and about to pass out.  Pre-syncope, burning  Dry heaves, patient states he felt like he was on fire and about to pass out.    Lisinopril Swelling    Tongue swelling, neck pain and Headaches    Tramadol Other (See Comments) and Shortness Of Breath    Other reaction(s): Other (See Comments) Other Reaction: tachy Dizziness and unsteady gait.   Pregabalin Palpitations    Other reaction(s): Other (See Comments) Other Reaction: edema     No family history on file.  Prior to Admission medications   Medication Sig Start Date End Date Taking? Authorizing Provider  albuterol (PROVENTIL) (2.5 MG/3ML) 0.083% nebulizer solution Take 3 mLs by nebulization every 6 (six) hours as needed for wheezing or shortness of breath. 12/13/20   [provider]  amiodarone (PACERONE) 200 MG tablet Take 1 tablet (200 mg total) by mouth 2 (two) times daily. Patient not taking: Reported on 10/12/2021 12/29/20   Nicole Kindred A, DO  amoxicillin (AMOXIL) 500 MG capsule Take 500 mg by mouth 3 (three) times daily. 07/07/21   [provider]  apixaban Arne Cleveland) 2.5 MG TABS tablet Take by mouth. 06/02/21   [provider]  blood glucose meter kit and supplies KIT Dispense based on patient and insurance preference. Use up to four times daily as directed. 12/29/20   Nicole Kindred A, DO  budesonide (PULMICORT) 0.5 MG/2ML nebulizer solution Inhale into the lungs.    [provider]  budesonide (PULMICORT) 1 MG/2ML nebulizer solution Take 1 mg by nebulization daily.    [provider]  Cyanocobalamin (B-12) 2500 MCG SUBL Place 1 tablet under the tongue daily. 10/12/21   Earlie Server, MD  ferrous sulfate 325 (65 FE) MG tablet Take 1 tablet (325 mg total) by mouth 2 (two) times daily with a meal. 05/13/21 05/13/22  Antonieta Pert, MD  fluticasone (FLONASE) 50 MCG/ACT nasal spray SPRAY 2 SPRAYS INTO EACH NOSTRIL EVERY DAY 02/07/17   Laverle Hobby, MD  furosemide (LASIX) 20 MG tablet Take 1 tablet (20 mg total) by mouth 2 (two) times daily. Resume it after checking kidney function and after discussing with primary care doctor Patient not taking: Reported on 07/12/2021 05/18/21   Antonieta Pert, MD  hydrocortisone 2.5 % cream Apply topically. 06/29/21   [provider]  hyoscyamine (LEVSIN SL) 0.125 MG SL tablet Place 0.125 mg under the tongue every 4 (four) hours as needed.    [provider]  Insulin Pen Needle (PEN  NEEDLES) 31G X 6 MM MISC 1 each by Does not apply route 3 (three) times daily with meals. Patient not taking: Reported on 07/12/2021 12/29/20   Nicole Kindred A, DO  ipratropium (ATROVENT) 0.02 % nebulizer solution Take 0.5 mg by nebulization 2 (two) times daily.    [provider]  levalbuterol Penne Lash HFA) 45 MCG/ACT inhaler Inhale 2 puffs into the lungs every 4 (four) hours as needed for wheezing.    [provider]  meclizine (ANTIVERT) 12.5 MG tablet Take 12.5 mg by mouth 3 (three) times daily as needed for dizziness. Patient not taking: Reported on 07/12/2021 04/18/21   [provider]  megestrol (MEGACE ES) 625 MG/5ML suspension Take 625 mg by mouth daily. Patient not taking: Reported on 07/12/2021 08/25/20   [provider]  metoprolol tartrate (LOPRESSOR) 50 MG tablet Take 50 mg by mouth daily. 05/25/21   [provider]  mirtazapine (REMERON) 15 MG tablet Take 15 mg by mouth at bedtime. 12/14/20  [provider]  mometasone-formoterol (DULERA) 200-5 MCG/ACT AERO Inhale 2 puffs into the lungs 2 (two) times daily. 12/29/20   Ezekiel Slocumb, DO  Multiple Vitamin (MULTIVITAMIN WITH MINERALS) TABS tablet Take 1 tablet by mouth daily. 12/30/20   Ezekiel Slocumb, DO  Nutritional Supplements (FEEDING SUPPLEMENT, NEPRO CARB STEADY,) LIQD Take 237 mLs by mouth 3 (three) times daily between meals. 12/29/20   Nicole Kindred A, DO  ondansetron (ZOFRAN-ODT) 4 MG disintegrating tablet Take 4 mg by mouth every 8 (eight) hours as needed for nausea or vomiting.    [provider]  oxyCODONE-acetaminophen (PERCOCET) 10-325 MG tablet Take 1 tablet by mouth 5 (five) times daily as needed. 06/24/21   [provider]  OXYCONTIN 10 MG 12 hr tablet Take 10 mg by mouth 3 (three) times daily. 12/22/20   [provider]  pantoprazole (PROTONIX) 20 MG tablet Take 20 mg by mouth daily. 06/15/20   [provider]  Rivaroxaban (XARELTO) 15  MG TABS tablet Take 1 tablet (15 mg total) by mouth daily with supper. Hold for 3 days 05/16/21   Antonieta Pert, MD  sodium chloride (OCEAN) 0.65 % SOLN nasal spray Place 1 spray into both nostrils as needed for congestion. 12/29/20   Ezekiel Slocumb, DO  spironolactone (ALDACTONE) 25 MG tablet Take 25 mg by mouth daily. 05/03/21   [provider]  tamsulosin (FLOMAX) 0.4 MG CAPS capsule Take 0.4 mg by mouth daily. 09/03/16   [provider]  tiotropium (SPIRIVA) 18 MCG inhalation capsule Place 1 capsule (18 mcg total) into inhaler and inhale daily. 12/30/20   Ezekiel Slocumb, DO  VENTOLIN HFA 108 (90 Base) MCG/ACT inhaler Inhale 2 puffs into the lungs every 6 (six) hours as needed. 08/07/16   [provider]    Physical Exam: Vitals:   05/01/22 2158 05/01/22 2159  BP: 130/63   Pulse: (!) 109   Resp: (!) 32   Temp: (!) 102.6 F (39.2 C)   SpO2: 99%   Weight:  72.6 kg   Physical Exam Vitals and nursing note reviewed.  Constitutional:      Appearance: He is ill-appearing.     Interventions: Nasal cannula in place.     Comments: Ill-appearing, with conversational dyspnea  HENT:     Head: Normocephalic and atraumatic.  Cardiovascular:     Rate and Rhythm: Normal rate and regular rhythm.     Heart sounds: Normal heart sounds.  Pulmonary:     Effort: Tachypnea present.     Breath sounds: Wheezing present.  Abdominal:     Palpations: Abdomen is soft.     Tenderness: There is no abdominal tenderness.  Neurological:     Mental Status: Mental status is at baseline.     Labs on Admission: I have personally reviewed following labs and imaging studies  CBC: Recent Labs  Lab 05/01/22 2234  WBC 6.9  NEUTROABS 6.1  HGB 11.0*  HCT 34.5*  MCV 105.5*  PLT 191*   Basic Metabolic Panel: Recent Labs  Lab 05/01/22 2234  NA 138  K 3.9  CL 100  CO2 27  GLUCOSE 104*  BUN 31*  CREATININE 2.18*  CALCIUM 8.7*   GFR: CrCl cannot be calculated (Unknown ideal  weight.). Liver Function Tests: Recent Labs  Lab 05/01/22 2234  AST 26  ALT 19  ALKPHOS 41  BILITOT 1.1  PROT 6.6  ALBUMIN 3.5   No results for input(s): "LIPASE", "AMYLASE" in the last 168 hours.  No results for input(s): "AMMONIA" in the last 168 hours. Coagulation Profile: Recent Labs  Lab 05/01/22 2234  INR 1.2   Cardiac Enzymes: No results for input(s): "CKTOTAL", "CKMB", "CKMBINDEX", "TROPONINI" in the last 168 hours. BNP (last 3 results) No results for input(s): "PROBNP" in the last 8760 hours. HbA1C: No results for input(s): "HGBA1C" in the last 72 hours. CBG: No results for input(s): "GLUCAP" in the last 168 hours. Lipid Profile: No results for input(s): "CHOL", "HDL", "LDLCALC", "TRIG", "CHOLHDL", "LDLDIRECT" in the last 72 hours. Thyroid Function Tests: No results for input(s): "TSH", "T4TOTAL", "FREET4", "T3FREE", "THYROIDAB" in the last 72 hours. Anemia Panel: No results for input(s): "VITAMINB12", "FOLATE", "FERRITIN", "TIBC", "IRON", "RETICCTPCT" in the last 72 hours. Urine analysis:    Component Value Date/Time   COLORURINE STRAW (A) 05/10/2021 1227   APPEARANCEUR CLEAR (A) 05/10/2021 1227   LABSPEC 1.009 05/10/2021 1227   PHURINE 5.0 05/10/2021 1227   GLUCOSEU NEGATIVE 05/10/2021 1227   HGBUR NEGATIVE 05/10/2021 1227   BILIRUBINUR NEGATIVE 05/10/2021 1227   KETONESUR NEGATIVE 05/10/2021 1227   PROTEINUR NEGATIVE 05/10/2021 1227   NITRITE NEGATIVE 05/10/2021 1227   LEUKOCYTESUR NEGATIVE 05/10/2021 1227    Radiological Exams on Admission: DG Chest 1 View  Result Date: 05/01/2022 CLINICAL DATA:  Sepsis EXAM: CHEST  1 VIEW COMPARISON:  10/05/2021 FINDINGS: Stable cardiomediastinal silhouette. Aortic atherosclerotic calcification. Chronic bronchitic change. Biapical pleural-parenchymal scarring. Increased interstitial thickening in the lower lungs compared with 10/05/2021 could be due to edema or infection. No pleural effusion or pneumothorax. No  displaced rib fractures. IMPRESSION: COPD.  Question superimposed edema or infection in the lower lungs. Electronically Signed   By: Placido Sou M.D.   On: 05/01/2022 23:17     Data Reviewed: Relevant notes from primary care and specialist visits, past discharge summaries as available in EHR, including Care Everywhere. Prior diagnostic testing as pertinent to current admission diagnoses Updated medications and problem lists for reconciliation ED course, including vitals, labs, imaging, treatment and response to treatment Triage notes, nursing and pharmacy notes and ED provider's notes Notable results as noted in HPI   Assessment and Plan: * Influenza A with pneumonia Sepsis, severe Sepsis criteria include fever, tachycardia and tachypnea with pneumonia as well as altered mental status Continue sepsis fluids Tamiflu Patient got Rocephin and azithromycin in the ED.  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 Renal function at baseline Hemoglobin at baseline  Atrial fibrillation, chronic (HCC) Rate controlled Continue apixaban, amiodarone and metoprolol  Type II diabetes mellitus with renal manifestations (HCC) Sliding scale insulin coverage  HTN (hypertension) Continue home metoprolol and spironolactone  Chronic pain Continue OxyContin with as needed Percocet   DVT prophylaxis: Apixaban  Consults: none  Advance Care Planning:   Code Status: Prior   Family Communication:  none  Disposition Plan: Back to previous home environment  Severity of Illness: The appropriate patient status for this patient is INPATIENT. Inpatient status is judged to be reasonable and necessary in order to provide the required intensity of service to ensure the patient's safety. The patient's presenting symptoms, physical exam  findings, and initial radiographic and laboratory data in the context of their chronic comorbidities is felt to place them at high risk for further clinical deterioration. Furthermore, it is not anticipated that the patient will be medically stable for discharge from the hospital within 2 midnights of admission.   * I certify that at the point of admission it is my clinical judgment that the patient will require inpatient hospital care spanning beyond 2 midnights from the point of admission due to high intensity of service, high risk for further deterioration and high frequency of surveillance required.*  Author: Athena Masse, MD 05/01/2022 11:52 PM  For on call review [email or web address].

## 2022-05-02 DIAGNOSIS — R6520 Severe sepsis without septic shock: Secondary | ICD-10-CM

## 2022-05-02 DIAGNOSIS — A419 Sepsis, unspecified organism: Secondary | ICD-10-CM | POA: Diagnosis not present

## 2022-05-02 DIAGNOSIS — G9341 Metabolic encephalopathy: Secondary | ICD-10-CM | POA: Diagnosis not present

## 2022-05-02 DIAGNOSIS — J09X1 Influenza due to identified novel influenza A virus with pneumonia: Secondary | ICD-10-CM | POA: Diagnosis not present

## 2022-05-02 LAB — URINALYSIS, ROUTINE W REFLEX MICROSCOPIC
Bilirubin Urine: NEGATIVE
Glucose, UA: NEGATIVE mg/dL
Hgb urine dipstick: NEGATIVE
Ketones, ur: 5 mg/dL — AB
Leukocytes,Ua: NEGATIVE
Nitrite: NEGATIVE
Protein, ur: 30 mg/dL — AB
Specific Gravity, Urine: 1.016 (ref 1.005–1.030)
pH: 5 (ref 5.0–8.0)

## 2022-05-02 LAB — CORTISOL-AM, BLOOD: Cortisol - AM: 16.3 ug/dL (ref 6.7–22.6)

## 2022-05-02 LAB — PROCALCITONIN: Procalcitonin: 2.03 ng/mL

## 2022-05-02 MED ORDER — LACTATED RINGERS IV SOLN: INTRAVENOUS | Status: AC

## 2022-05-02 MED ORDER — OXYCODONE HCL ER 10 MG PO T12A
10.0000 mg | EXTENDED_RELEASE_TABLET | Freq: Two times a day (BID) | ORAL | Status: DC
  Administered 2022-05-02 – 2022-05-04 (×5): 10 mg via ORAL
  Filled 2022-05-02 (×5): qty 1

## 2022-05-02 MED ORDER — METOPROLOL TARTRATE 50 MG PO TABS
50.0000 mg | ORAL_TABLET | Freq: Every day | ORAL | Status: DC
  Administered 2022-05-02 – 2022-05-04 (×3): 50 mg via ORAL
  Filled 2022-05-02 (×3): qty 1

## 2022-05-02 MED ORDER — APIXABAN 2.5 MG PO TABS
2.5000 mg | ORAL_TABLET | Freq: Two times a day (BID) | ORAL | Status: DC
  Administered 2022-05-02 – 2022-05-04 (×5): 2.5 mg via ORAL
  Filled 2022-05-02 (×5): qty 1

## 2022-05-02 MED ORDER — ONDANSETRON HCL 4 MG PO TABS
4.0000 mg | ORAL_TABLET | Freq: Four times a day (QID) | ORAL | Status: DC | PRN
  Administered 2022-05-03: 4 mg via ORAL
  Filled 2022-05-02: qty 1

## 2022-05-02 MED ORDER — GUAIFENESIN-DM 100-10 MG/5ML PO SYRP: 10.0000 mL | ORAL_SOLUTION | ORAL | Status: DC | PRN

## 2022-05-02 MED ORDER — ALBUTEROL SULFATE (2.5 MG/3ML) 0.083% IN NEBU
3.0000 mL | INHALATION_SOLUTION | Freq: Four times a day (QID) | RESPIRATORY_TRACT | Status: DC | PRN
  Administered 2022-05-02 – 2022-05-04 (×3): 3 mL via RESPIRATORY_TRACT
  Filled 2022-05-02 (×2): qty 3

## 2022-05-02 MED ORDER — OXYCODONE HCL 5 MG PO TABS
10.0000 mg | ORAL_TABLET | ORAL | Status: DC | PRN
  Administered 2022-05-02 – 2022-05-03 (×4): 10 mg via ORAL
  Filled 2022-05-02 (×4): qty 2

## 2022-05-02 MED ORDER — TAMSULOSIN HCL 0.4 MG PO CAPS
0.4000 mg | ORAL_CAPSULE | Freq: Every day | ORAL | Status: DC
  Administered 2022-05-02 – 2022-05-04 (×3): 0.4 mg via ORAL
  Filled 2022-05-02 (×3): qty 1

## 2022-05-02 MED ORDER — OXYCODONE-ACETAMINOPHEN 10-325 MG PO TABS: 1.0000 | ORAL_TABLET | Freq: Every day | ORAL | Status: DC | PRN

## 2022-05-02 MED ORDER — ACETAMINOPHEN 325 MG PO TABS: 325.0000 mg | ORAL_TABLET | ORAL | Status: DC | PRN

## 2022-05-02 MED ORDER — SODIUM CHLORIDE 0.9 % IV SOLN
1.0000 g | INTRAVENOUS | Status: DC
  Administered 2022-05-02 – 2022-05-03 (×2): 1 g via INTRAVENOUS
  Filled 2022-05-02 (×3): qty 10

## 2022-05-02 MED ORDER — ONDANSETRON HCL 4 MG/2ML IJ SOLN: 4.0000 mg | Freq: Four times a day (QID) | INTRAMUSCULAR | Status: DC | PRN

## 2022-05-02 MED ORDER — SODIUM CHLORIDE 0.9 % IV SOLN
500.0000 mg | INTRAVENOUS | Status: AC
  Administered 2022-05-02 – 2022-05-04 (×2): 500 mg via INTRAVENOUS
  Filled 2022-05-02 (×2): qty 5

## 2022-05-02 MED ORDER — IPRATROPIUM BROMIDE 0.02 % IN SOLN
0.5000 mg | Freq: Two times a day (BID) | RESPIRATORY_TRACT | Status: DC
  Administered 2022-05-02 – 2022-05-04 (×5): 0.5 mg via RESPIRATORY_TRACT
  Filled 2022-05-02 (×5): qty 2.5

## 2022-05-02 MED ORDER — BUDESONIDE 0.5 MG/2ML IN SUSP
1.0000 mg | Freq: Every day | RESPIRATORY_TRACT | Status: DC
  Administered 2022-05-02 – 2022-05-04 (×3): 1 mg via RESPIRATORY_TRACT
  Filled 2022-05-02 (×3): qty 4

## 2022-05-02 NOTE — Progress Notes (Signed)
Progress Note    Riley Martin  DVV:[id number] DOB: [birth year]  DOA: 05/01/2022 PCP: [name], MD      Brief Narrative:    Medical records reviewed and are as summarized below:  Riley Martin is a 76 y.o. 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  He was febrile in the emergency department with temperature of 102.6 F, tachycardic, tachypneic and requiring up to 6 L/min oxygen.         Assessment/Plan:   Principal Problem:   Influenza A with pneumonia Active Problems:   Severe sepsis ([place])   Acute metabolic encephalopathy   COPD (chronic obstructive pulmonary disease) (HCC)   Chronic respiratory failure with hypoxia (HCC)   Chronic pain   HTN (hypertension)   Type II diabetes mellitus with renal manifestations (HCC)   Atrial fibrillation, chronic (HCC)   Stage 3b chronic kidney disease (HCC)   Anemia of chronic renal failure, stage 3b (HCC)    Body mass index is 21.7 kg/m.   Severe sepsis secondary to Influenza A with pneumonia: Continue Tamiflu.  Procalcitonin level slightly elevated.  Continue IV ceftriaxone and azithromycin.  Acute on chronic hypoxic respiratory failure: Oxygen has been tapered down from 5 to 2 L/min oxygen.  He uses 4 L/min oxygen at baseline.  Acute metabolic encephalopathy: Improving.  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 Yes; Fluid consistency: Thin  Diet effective now                             Consultants: None  Procedures: None    Medications:    apixaban  2.5 mg Oral BID   budesonide  1 mg Nebulization Daily   ipratropium  0.5 mg Nebulization BID   metoprolol tartrate  50 mg Oral Daily   oseltamivir  30 mg Oral BID   oxyCODONE  10 mg Oral Q12H   tamsulosin  0.4 mg Oral Daily   Continuous Infusions:   Anti-infectives (From admission, onward)    Start     Dose/Rate Route Frequency Ordered Stop   05/02/22 0000  oseltamivir (TAMIFLU) capsule 30 mg        30 mg Oral 2 times daily 05/01/22 2353 05/06/22 2159   05/01/22 2330  azithromycin (ZITHROMAX) 500 mg in sodium chloride 0.9 % 250 mL IVPB        500 mg 250 mL/hr over 60 Minutes Intravenous  Once 05/01/22 2323 05/02/22 0144   05/01/22 2330  cefTRIAXone (ROCEPHIN) 1 g in sodium chloride 0.9 % 100 mL IVPB        1 g 200 mL/hr over 30 Minutes Intravenous  Once 05/01/22 2323 05/02/22 0117              Family Communication/Anticipated D/C date and plan/Code Status   DVT prophylaxis: apixaban (ELIQUIS) tablet 2.5 mg Start: 05/02/22 0800 apixaban (ELIQUIS) tablet 2.5 mg     Code Status: Full Code  Family Communication: Plan discussed  with his wife at the bedside Disposition Plan: Plan to discharge home in 2 to 3 days   Status is: Inpatient Remains inpatient appropriate because: Pneumonia, hypoxia       Subjective:   Interval events noted.  Complains of cough, shortness of breath, chronic neck and low back pain.  Requested home dose of OxyContin.  His wife is at the bedside.  Lanelle Bal, RN, was also at the bedside.  Objective:    Vitals:   05/02/22 0930 05/02/22 1000 05/02/22 1030 05/02/22 1100  BP: 113/71 (!) 117/57 110/67 (!) 127/58  Pulse: 73 63 64 69  Resp: 18 19 (!) 22 16  Temp:      TempSrc:      SpO2: 91% 97% 97% 90%  Weight:      Height:       No data found.   Intake/Output Summary (Last 24 hours) at 05/02/2022 1222 Last data filed at 05/02/2022  0144 Gross per 24 hour  Intake 2350 ml  Output --  Net 2350 ml   Filed Weights   05/01/22 2159  Weight: 72.6 kg    Exam:  GEN: NAD SKIN: Warm and dry EYES: EOMI ENT: MMM CV: RRR PULM: Decreased air entry, coarse breath sounds, b/l wheezing ABD: soft, ND, NT, +BS CNS: AAO x 3, non focal EXT: No edema or tenderness        Data Reviewed:   I have personally reviewed following labs and imaging studies:  Labs: Labs show the following:   Basic Metabolic Panel: Recent Labs  Lab 05/01/22 2234  NA 138  K 3.9  CL 100  CO2 27  GLUCOSE 104*  BUN 31*  CREATININE 2.18*  CALCIUM 8.7*   GFR Estimated Creatinine Clearance: 28.7 mL/min (A) (by C-G formula based on SCr of 2.18 mg/dL (H)). Liver Function Tests: Recent Labs  Lab 05/01/22 2234  AST 26  ALT 19  ALKPHOS 41  BILITOT 1.1  PROT 6.6  ALBUMIN 3.5   No results for input(s): "LIPASE", "AMYLASE" in the last 168 hours. No results for input(s): "AMMONIA" in the last 168 hours. Coagulation profile Recent Labs  Lab 05/01/22 2234  INR 1.2    CBC: Recent Labs  Lab 05/01/22 2234  WBC 6.9  NEUTROABS 6.1  HGB 11.0*  HCT 34.5*  MCV 105.5*  PLT 120*   Cardiac Enzymes: No results for input(s): "CKTOTAL", "CKMB", "CKMBINDEX", "TROPONINI" in the last 168 hours. BNP (last 3 results) No results for input(s): "PROBNP" in the last 8760 hours. CBG: No results for input(s): "GLUCAP" in the last 168 hours. D-Dimer: No results for input(s): "DDIMER" in the last 72 hours. Hgb A1c: No results for input(s): "HGBA1C" in the last 72 hours. Lipid Profile: No results for input(s): "CHOL", "HDL", "LDLCALC", "TRIG", "CHOLHDL", "LDLDIRECT" in the last 72 hours. Thyroid function studies: No results for input(s): "TSH", "T4TOTAL", "T3FREE", "THYROIDAB" in the last 72 hours.  Invalid input(s): "FREET3" Anemia work up: No results for input(s): "VITAMINB12", "FOLATE", "FERRITIN", "TIBC", "IRON", "RETICCTPCT" in the last  72 hours. Sepsis Labs: Recent Labs  Lab 05/01/22 2234 05/02/22 0843  PROCALCITON  --  2.03  WBC 6.9  --   LATICACIDVEN 1.1  --     Microbiology Recent Results (from the past 240 hour(s))  Culture, blood (Routine x 2)     Status: None (Preliminary result)   Collection Time: 05/01/22 10:34 PM   Specimen: BLOOD RIGHT FOREARM  Result Value Ref Range Status   Specimen Description BLOOD RIGHT  FOREARM  Final   Special Requests   Final    BOTTLES DRAWN AEROBIC AND ANAEROBIC Blood Culture adequate volume   Culture   Final    NO GROWTH < 12 HOURS Performed at [facility], [address] [place], [place] [address]    Report Status PENDING  Incomplete  Culture, blood (Routine x 2)     Status: None (Preliminary result)   Collection Time: 05/01/22 10:34 PM   Specimen: BLOOD RIGHT WRIST  Result Value Ref Range Status   Specimen Description BLOOD RIGHT WRIST  Final   Special Requests   Final    BOTTLES DRAWN AEROBIC AND ANAEROBIC Blood Culture adequate volume   Culture   Final    NO GROWTH < 12 HOURS Performed at [facility], [address] [place], [place] [address]    Report Status PENDING  Incomplete  Resp panel by RT-PCR (RSV, Flu A&B, Covid) Anterior Nasal Swab     Status: Abnormal   Collection Time: 05/01/22 10:40 PM   Specimen: Anterior Nasal Swab  Result Value Ref Range Status   SARS Coronavirus 2 by RT PCR NEGATIVE NEGATIVE Final    Comment: (NOTE) SARS-CoV-2 target nucleic acids are NOT DETECTED.  The SARS-CoV-2 RNA is generally detectable in upper respiratory specimens during the acute phase of infection. The lowest concentration of SARS-CoV-2 viral copies this assay can detect is 138 copies/mL. A negative result does not preclude SARS-Cov-2 infection and should not be used as the sole basis for treatment or other patient management decisions. A negative result may occur with  improper specimen collection/handling, submission of specimen  other than nasopharyngeal swab, presence of viral mutation(s) within the areas targeted by this assay, and inadequate number of viral copies(<138 copies/mL). A negative result must be combined with clinical observations, patient history, and epidemiological information. The expected result is Negative.  Fact Sheet for Patients:  EntrepreneurPulse.com.au  Fact Sheet for Healthcare Providers:  IncredibleEmployment.be  This test is no t yet approved or cleared by the [place] FDA and  has been authorized for detection and/or diagnosis of SARS-CoV-2 by FDA under an Emergency Use Authorization (EUA). This EUA will remain  in effect (meaning this test can be used) for the duration of the COVID-19 declaration under Section 564(b)(1) of the Act, 21 U.S.C.section 360bbb-3(b)(1), unless the authorization is terminated  or revoked sooner.       Influenza A by PCR POSITIVE (A) NEGATIVE Final   Influenza B by PCR NEGATIVE NEGATIVE Final    Comment: (NOTE) The Xpert Xpress SARS-CoV-2/FLU/RSV plus assay is intended as an aid in the diagnosis of influenza from Nasopharyngeal swab specimens and should not be used as a sole basis for treatment. Nasal washings and aspirates are unacceptable for Xpert Xpress SARS-CoV-2/FLU/RSV testing.  Fact Sheet for Patients: EntrepreneurPulse.com.au  Fact Sheet for Healthcare Providers: IncredibleEmployment.be  This test is not yet approved or cleared by the [place] FDA and has been authorized for detection and/or diagnosis of SARS-CoV-2 by FDA under an Emergency Use Authorization (EUA). This EUA will remain in effect (meaning this test can be used) for the duration of the COVID-19 declaration under Section 564(b)(1) of the Act, 21 U.S.C. section 360bbb-3(b)(1), unless the authorization is terminated or revoked.     Resp Syncytial Virus by PCR NEGATIVE NEGATIVE Final     Comment: (NOTE) Fact Sheet for Patients: EntrepreneurPulse.com.au  Fact Sheet for Healthcare Providers: IncredibleEmployment.be  This test is not yet approved or cleared by the  [place] [employer] and has been authorized for detection and/or diagnosis of SARS-CoV-2 by FDA under an Print production planner (EUA). This EUA will remain in effect (meaning this test can be used) for the duration of the COVID-19 declaration under Section 564(b)(1) of the Act, 21 U.S.C. section 360bbb-3(b)(1), unless the authorization is terminated or revoked.  Performed at [facility], [address] [place], [place] [address]     Procedures and diagnostic studies:  DG Chest 1 View  Result Date: 05/01/2022 CLINICAL DATA:  Sepsis EXAM: CHEST  1 VIEW COMPARISON:  10/05/2021 FINDINGS: Stable cardiomediastinal silhouette. Aortic atherosclerotic calcification. Chronic bronchitic change. Biapical pleural-parenchymal scarring. Increased interstitial thickening in the lower lungs compared with 10/05/2021 could be due to edema or infection. No pleural effusion or pneumothorax. No displaced rib fractures. IMPRESSION: COPD.  Question superimposed edema or infection in the lower lungs. Electronically Signed   By: Placido Sou M.D.   On: 05/01/2022 23:17               LOS: 1 day   Marialy Urbanczyk  [facility]   Pager on [email or web address]. If 7PM-7AM, please contact night-coverage at [email or web address]     05/02/2022, 12:22 PM

## 2022-05-02 NOTE — ED Notes (Signed)
Pt at 90% room air. Placed on 2L [place] and pt at 97%

## 2022-05-02 NOTE — ED Notes (Signed)
Pt at 90% room air. Pt placed on 2L [place]

## 2022-05-02 NOTE — Assessment & Plan Note (Signed)
Patient presents with lethargy and confusion, secondary to sepsis Fall, aspiration and delirium precautions Neurochecks

## 2022-05-02 NOTE — ED Notes (Signed)
Pt O2sats in low 80s with 2L [place]. RN increased it to 5L with [place]. Pt O2 sat at 924%.

## 2022-05-03 DIAGNOSIS — J09X1 Influenza due to identified novel influenza A virus with pneumonia: Secondary | ICD-10-CM | POA: Diagnosis not present

## 2022-05-03 DIAGNOSIS — R6520 Severe sepsis without septic shock: Secondary | ICD-10-CM | POA: Diagnosis not present

## 2022-05-03 DIAGNOSIS — G9341 Metabolic encephalopathy: Secondary | ICD-10-CM | POA: Diagnosis not present

## 2022-05-03 DIAGNOSIS — A419 Sepsis, unspecified organism: Secondary | ICD-10-CM | POA: Diagnosis not present

## 2022-05-03 LAB — URINE CULTURE: Culture: NO GROWTH

## 2022-05-03 LAB — BASIC METABOLIC PANEL
Anion gap: 5 (ref 5–15)
BUN: 25 mg/dL — ABNORMAL HIGH (ref 8–23)
CO2: 26 mmol/L (ref 22–32)
Calcium: 7.7 mg/dL — ABNORMAL LOW (ref 8.9–10.3)
Chloride: 104 mmol/L (ref 98–111)
Creatinine, Ser: 1.7 mg/dL — ABNORMAL HIGH (ref 0.61–1.24)
GFR, Estimated: 41 mL/min — ABNORMAL LOW (ref >60)
Glucose, Bld: 96 mg/dL (ref 70–99)
Potassium: 3.8 mmol/L (ref 3.5–5.1)
Sodium: 135 mmol/L (ref 135–145)

## 2022-05-03 LAB — CBC WITH DIFFERENTIAL/PLATELET
Abs Immature Granulocytes: 0.01 10*3/uL (ref 0.00–0.07)
Basophils Absolute: 0 10*3/uL (ref 0.0–0.1)
Basophils Relative: 0 %
Eosinophils Absolute: 0.1 10*3/uL (ref 0.0–0.5)
Eosinophils Relative: 1 %
HCT: 28.2 % — ABNORMAL LOW (ref 39.0–52.0)
Hemoglobin: 9 g/dL — ABNORMAL LOW (ref 13.0–17.0)
Immature Granulocytes: 0 %
Lymphocytes Relative: 24 %
Lymphs Abs: 1.1 10*3/uL (ref 0.7–4.0)
MCH: 34.2 pg — ABNORMAL HIGH (ref 26.0–34.0)
MCHC: 31.9 g/dL (ref 30.0–36.0)
MCV: 107.2 fL — ABNORMAL HIGH (ref 80.0–100.0)
Monocytes Absolute: 0.4 10*3/uL (ref 0.1–1.0)
Monocytes Relative: 10 %
Neutro Abs: 2.8 10*3/uL (ref 1.7–7.7)
Neutrophils Relative %: 65 %
Platelets: 97 10*3/uL — ABNORMAL LOW (ref 150–400)
RBC: 2.63 MIL/uL — ABNORMAL LOW (ref 4.22–5.81)
RDW: 13.2 % (ref 11.5–15.5)
Smear Review: NORMAL
WBC: 4.4 10*3/uL (ref 4.0–10.5)
nRBC: 0 % (ref 0.0–0.2)

## 2022-05-03 LAB — CBG MONITORING, ED: Glucose-Capillary: 118 mg/dL — ABNORMAL HIGH (ref 70–99)

## 2022-05-03 MED ORDER — MELATONIN 5 MG PO TABS: 5.0000 mg | ORAL_TABLET | Freq: Once | ORAL | Status: DC

## 2022-05-03 MED ORDER — NITROGLYCERIN 0.4 MG SL SUBL
SUBLINGUAL_TABLET | SUBLINGUAL | Status: AC
  Filled 2022-05-03: qty 1

## 2022-05-03 MED ORDER — NITROGLYCERIN 0.4 MG SL SUBL
0.4000 mg | SUBLINGUAL_TABLET | SUBLINGUAL | Status: DC | PRN
  Administered 2022-05-03: 0.4 mg via SUBLINGUAL

## 2022-05-03 NOTE — Progress Notes (Signed)
Progress Note    Markelle Najarian Nair  GHW:[id number] DOB: [birth year]  DOA: 05/01/2022 PCP: [name], MD      Brief Narrative:    Medical records reviewed and are as summarized below:  Riley Martin is a 76 y.o. 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  He was febrile in the emergency department with temperature of 102.6 F, tachycardic, tachypneic and requiring up to 6 L/min oxygen.         Assessment/Plan:   Principal Problem:   Influenza A with pneumonia Active Problems:   Severe sepsis ([place])   Acute metabolic encephalopathy   COPD (chronic obstructive pulmonary disease) (HCC)   Chronic respiratory failure with hypoxia (HCC)   Chronic pain   HTN (hypertension)   Type II diabetes mellitus with renal manifestations (HCC)   Atrial fibrillation, chronic (HCC)   Stage 3b chronic kidney disease (HCC)   Anemia of chronic renal failure, stage 3b (HCC)    Body mass index is 21.7 kg/m.   Severe sepsis secondary to Influenza A with pneumonia: Continue Tamiflu.  Continue IV ceftriaxone and azithromycin for another day.  Procalcitonin level was slightly elevated (2.03).   Acute on chronic hypoxic respiratory failure: Oxygen therapy increased to 5 L/min via nasal cannula.  He is still close to baseline.  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 Yes; Fluid consistency: Thin  Diet effective now                             Consultants: None  Procedures: None    Medications:    apixaban  2.5 mg Oral BID   budesonide  1 mg Nebulization Daily   ipratropium  0.5 mg Nebulization BID   metoprolol tartrate  50 mg Oral Daily   oseltamivir  30 mg Oral BID   oxyCODONE  10 mg Oral Q12H   tamsulosin  0.4 mg Oral Daily   Continuous Infusions:  azithromycin Stopped (05/02/22 2337)   cefTRIAXone (ROCEPHIN)  IV Stopped (05/02/22 2308)     Anti-infectives (From admission, onward)    Start     Dose/Rate Route Frequency Ordered Stop   05/02/22 2200  azithromycin (ZITHROMAX) 500 mg in sodium chloride 0.9 % 250 mL IVPB        500 mg 250 mL/hr over 60 Minutes Intravenous Every 24 hours 05/02/22 1234 05/04/22 2159   05/02/22 2200  cefTRIAXone (ROCEPHIN) 1 g in sodium chloride 0.9 % 100 mL IVPB        1 g 200 mL/hr over 30 Minutes Intravenous Every 24 hours 05/02/22 1234 05/05/22 2159   05/02/22 0000  oseltamivir (TAMIFLU) capsule 30 mg        30 mg Oral 2 times daily 05/01/22 2353 05/06/22 2159   05/01/22 2330  azithromycin (ZITHROMAX) 500 mg in sodium chloride 0.9 % 250 mL IVPB        500 mg 250 mL/hr over  60 Minutes Intravenous  Once 05/01/22 2323 05/02/22 0144   05/01/22 2330  cefTRIAXone (ROCEPHIN) 1 g in sodium chloride 0.9 % 100 mL IVPB        1 g 200 mL/hr over 30 Minutes Intravenous  Once 05/01/22 2323 05/02/22 0117              Family Communication/Anticipated D/C date and plan/Code Status   DVT prophylaxis: apixaban (ELIQUIS) tablet 2.5 mg Start: 05/02/22 0800 apixaban (ELIQUIS) tablet 2.5 mg     Code Status: Full Code  Family Communication: None Disposition Plan: Plan to discharge home in 1 to 2 days   Status is: Inpatient Remains inpatient appropriate because: Pneumonia, hypoxia       Subjective:   Interval events noted.  He complains of cough and "sore chest".  Breathing is a little better today.  Objective:    Vitals:   05/03/22 0821 05/03/22  0900 05/03/22 1000 05/03/22 1143  BP: (!) 113/48 114/67 (!) 135/56 (!) 111/54  Pulse: 66 78 80 63  Resp: 19   19  Temp: 98.2 F (36.8 C)   98.3 F (36.8 C)  TempSrc: Oral   Oral  SpO2: 94% 100% 95% 96%  Weight:      Height:       No data found.   Intake/Output Summary (Last 24 hours) at 05/03/2022 1319 Last data filed at 05/03/2022 0348 Gross per 24 hour  Intake 347.14 ml  Output 225 ml  Net 122.14 ml   Filed Weights   05/01/22 2159  Weight: 72.6 kg    Exam:    GEN: NAD SKIN: Warm and dry EYES: Anicteric ENT: MMM CV: RRR PULM: Coarse breath sounds, occasional wheezing ABD: soft, ND, NT, +BS CNS: AAO x 3, non focal EXT: No edema or tenderness    Data Reviewed:   I have personally reviewed following labs and imaging studies:  Labs: Labs show the following:   Basic Metabolic Panel: Recent Labs  Lab 05/01/22 2234 05/03/22 0056  NA 138 135  K 3.9 3.8  CL 100 104  CO2 27 26  GLUCOSE 104* 96  BUN 31* 25*  CREATININE 2.18* 1.70*  CALCIUM 8.7* 7.7*   GFR Estimated Creatinine Clearance: 36.8 mL/min (A) (by C-G formula based on SCr of 1.7 mg/dL (H)). Liver Function Tests: Recent Labs  Lab 05/01/22 2234  AST 26  ALT 19  ALKPHOS 41  BILITOT 1.1  PROT 6.6  ALBUMIN 3.5   No results for input(s): "LIPASE", "AMYLASE" in the last 168 hours. No results for input(s): "AMMONIA" in the last 168 hours. Coagulation profile Recent Labs  Lab 05/01/22 2234  INR 1.2    CBC: Recent Labs  Lab 05/01/22 2234 05/03/22 0056  WBC 6.9 4.4  NEUTROABS 6.1 2.8  HGB 11.0* 9.0*  HCT 34.5* 28.2*  MCV 105.5* 107.2*  PLT 120* 97*   Cardiac Enzymes: No results for input(s): "CKTOTAL", "CKMB", "CKMBINDEX", "TROPONINI" in the last 168 hours. BNP (last 3 results) No results for input(s): "PROBNP" in the last 8760 hours. CBG: No results for input(s): "GLUCAP" in the last 168 hours. D-Dimer: No results for input(s): "DDIMER" in the last 72 hours. Hgb A1c: No  results for input(s): "HGBA1C" in the last 72 hours. Lipid Profile: No results for input(s): "CHOL", "HDL", "LDLCALC", "TRIG", "CHOLHDL", "LDLDIRECT" in the last 72 hours. Thyroid function studies: No results for input(s): "TSH", "T4TOTAL", "T3FREE", "THYROIDAB" in the last 72 hours.  Invalid input(s): "FREET3" Anemia work up: No results  for input(s): "VITAMINB12", "FOLATE", "FERRITIN", "TIBC", "IRON", "RETICCTPCT" in the last 72 hours. Sepsis Labs: Recent Labs  Lab 05/01/22 2234 05/02/22 0843 05/03/22 0056  PROCALCITON  --  2.03  --   WBC 6.9  --  4.4  LATICACIDVEN 1.1  --   --     Microbiology Recent Results (from the past 240 hour(s))  Culture, blood (Routine x 2)     Status: None (Preliminary result)   Collection Time: 05/01/22 10:34 PM   Specimen: BLOOD RIGHT FOREARM  Result Value Ref Range Status   Specimen Description BLOOD RIGHT FOREARM  Final   Special Requests   Final    BOTTLES DRAWN AEROBIC AND ANAEROBIC Blood Culture adequate volume   Culture   Final    NO GROWTH 2 DAYS Performed at [facility], [address], [place], [place] [address]    Report Status PENDING  Incomplete  Culture, blood (Routine x 2)     Status: None (Preliminary result)   Collection Time: 05/01/22 10:34 PM   Specimen: BLOOD RIGHT WRIST  Result Value Ref Range Status   Specimen Description BLOOD RIGHT WRIST  Final   Special Requests   Final    BOTTLES DRAWN AEROBIC AND ANAEROBIC Blood Culture adequate volume   Culture   Final    NO GROWTH 2 DAYS Performed at [facility], [address] [place], [place] [address]    Report Status PENDING  Incomplete  Resp panel by RT-PCR (RSV, Flu A&B, Covid) Anterior Nasal Swab     Status: Abnormal   Collection Time: 05/01/22 10:40 PM   Specimen: Anterior Nasal Swab  Result Value Ref Range Status   SARS Coronavirus 2 by RT PCR NEGATIVE NEGATIVE Final    Comment: (NOTE) SARS-CoV-2 target nucleic acids are NOT DETECTED.  The  SARS-CoV-2 RNA is generally detectable in upper respiratory specimens during the acute phase of infection. The lowest concentration of SARS-CoV-2 viral copies this assay can detect is 138 copies/mL. A negative result does not preclude SARS-Cov-2 infection and should not be used as the sole basis for treatment or other patient management decisions. A negative result may occur with  improper specimen collection/handling, submission of specimen other than nasopharyngeal swab, presence of viral mutation(s) within the areas targeted by this assay, and inadequate number of viral copies(<138 copies/mL). A negative result must be combined with clinical observations, patient history, and epidemiological information. The expected result is Negative.  Fact Sheet for Patients:  EntrepreneurPulse.com.au  Fact Sheet for Healthcare Providers:  IncredibleEmployment.be  This test is no t yet approved or cleared by the [place] FDA and  has been authorized for detection and/or diagnosis of SARS-CoV-2 by FDA under an Emergency Use Authorization (EUA). This EUA will remain  in effect (meaning this test can be used) for the duration of the COVID-19 declaration under Section 564(b)(1) of the Act, 21 U.S.C.section 360bbb-3(b)(1), unless the authorization is terminated  or revoked sooner.       Influenza A by PCR POSITIVE (A) NEGATIVE Final   Influenza B by PCR NEGATIVE NEGATIVE Final    Comment: (NOTE) The Xpert Xpress SARS-CoV-2/FLU/RSV plus assay is intended as an aid in the diagnosis of influenza from Nasopharyngeal swab specimens and should not be used as a sole basis for treatment. Nasal washings and aspirates are unacceptable for Xpert Xpress SARS-CoV-2/FLU/RSV testing.  Fact Sheet for Patients: EntrepreneurPulse.com.au  Fact Sheet for Healthcare Providers: IncredibleEmployment.be  This test is not yet approved or  cleared by  the [employer] and has been authorized for detection and/or diagnosis of SARS-CoV-2 by FDA under an Emergency Use Authorization (EUA). This EUA will remain in effect (meaning this test can be used) for the duration of the COVID-19 declaration under Section 564(b)(1) of the Act, 21 U.S.C. section 360bbb-3(b)(1), unless the authorization is terminated or revoked.     Resp Syncytial Virus by PCR NEGATIVE NEGATIVE Final    Comment: (NOTE) Fact Sheet for Patients: EntrepreneurPulse.com.au  Fact Sheet for Healthcare Providers: IncredibleEmployment.be  This test is not yet approved or cleared by the [place] FDA and has been authorized for detection and/or diagnosis of SARS-CoV-2 by FDA under an Emergency Use Authorization (EUA). This EUA will remain in effect (meaning this test can be used) for the duration of the COVID-19 declaration under Section 564(b)(1) of the Act, 21 U.S.C. section 360bbb-3(b)(1), unless the authorization is terminated or revoked.  Performed at [facility], [address], [place], [place] [address]   Urine Culture     Status: None   Collection Time: 05/01/22 10:41 PM   Specimen: In/Out Cath Urine  Result Value Ref Range Status   Specimen Description   Final    IN/OUT CATH URINE Performed at [facility], [address] [place], [place] [address]    Special Requests   Final    NONE Performed at [facility], [address], [place], [place] [address]    Culture   Final    NO GROWTH Performed at [facility], [address] [place], [place] [address]    Report Status 05/03/2022 FINAL  Final    Procedures and diagnostic studies:  DG Chest 1 View  Result Date: 05/01/2022 CLINICAL DATA:  Sepsis EXAM: CHEST  1 VIEW COMPARISON:  10/05/2021 FINDINGS: Stable cardiomediastinal silhouette. Aortic atherosclerotic calcification. Chronic bronchitic change. Biapical  pleural-parenchymal scarring. Increased interstitial thickening in the lower lungs compared with 10/05/2021 could be due to edema or infection. No pleural effusion or pneumothorax. No displaced rib fractures. IMPRESSION: COPD.  Question superimposed edema or infection in the lower lungs. Electronically Signed   By: Placido Sou M.D.   On: 05/01/2022 23:17               LOS: 2 days   Leonardo Makris  [facility]   Pager on [email or web address]. If 7PM-7AM, please contact night-coverage at [email or web address]     05/03/2022, 1:19 PM

## 2022-05-03 NOTE — Progress Notes (Signed)
       CROSS COVER NOTE  NAME: Riley Martin MRN: [id number] DOB : July 21, 1945 ATTENDING PHYSICIAN: Jennye Boroughs, MD    Date of Service   05/03/2022   HPI/Events of Note   Sleep aid  Interventions   Assessment/Plan: Melatonin X X        To reach the provider On-Call:   7AM- 7PM see care teams to locate the attending and reach out to them via [email or web address]. 7PM-7AM contact night-coverage If you still have difficulty reaching the appropriate provider, please page the [facility] (Director on Call) for [facility] on amion for assistance  This document was prepared using Set designer software and may include unintentional dictation errors.  Neomia Glass DNP, MBA, FNP-BC Nurse Practitioner [facility] Pager [phone]

## 2022-05-04 DIAGNOSIS — A419 Sepsis, unspecified organism: Secondary | ICD-10-CM | POA: Diagnosis not present

## 2022-05-04 DIAGNOSIS — J09X1 Influenza due to identified novel influenza A virus with pneumonia: Secondary | ICD-10-CM | POA: Diagnosis not present

## 2022-05-04 DIAGNOSIS — G9341 Metabolic encephalopathy: Secondary | ICD-10-CM | POA: Diagnosis not present

## 2022-05-04 DIAGNOSIS — R6520 Severe sepsis without septic shock: Secondary | ICD-10-CM | POA: Diagnosis not present

## 2022-05-04 LAB — TROPONIN I (HIGH SENSITIVITY)
Troponin I (High Sensitivity): 8 ng/L (ref <18)
Troponin I (High Sensitivity): 8 ng/L (ref <18)

## 2022-05-04 MED ORDER — OSELTAMIVIR PHOSPHATE 30 MG PO CAPS: 30.0000 mg | ORAL_CAPSULE | Freq: Two times a day (BID) | ORAL | 0 refills | Status: AC

## 2022-05-04 MED ORDER — AMOXICILLIN-POT CLAVULANATE 500-125 MG PO TABS: 1.0000 | ORAL_TABLET | Freq: Two times a day (BID) | ORAL | 0 refills | Status: AC

## 2022-05-04 NOTE — ED Notes (Signed)
Request made for transport to the floor ?

## 2022-05-04 NOTE — Progress Notes (Incomplete)
{  Select_TRH_Note:[id number]} 

## 2022-05-04 NOTE — Discharge Summary (Signed)
Physician Discharge Summary   Patient: Riley Martin MRN: [id number] DOB: 1945/10/15  Admit date:     05/01/2022  Discharge date: 05/04/22  Discharge Physician: Jennye Boroughs   PCP: [name], MD   Recommendations at discharge:   Follow-up with PCP or pulmonologist, Dr. Lanney Gins, in 1 to 2 weeks  Discharge Diagnoses: Principal Problem:   Influenza A with pneumonia Active Problems:   Severe sepsis ([place])   Acute metabolic encephalopathy   COPD (chronic obstructive pulmonary disease) (HCC)   Chronic respiratory failure with hypoxia (HCC)   Chronic pain   HTN (hypertension)   Type II diabetes mellitus with renal manifestations (HCC)   Atrial fibrillation, chronic (HCC)   Stage 3b chronic kidney disease (HCC)   Anemia of chronic renal failure, stage 3b ([place])  Resolved Problems:   * No resolved hospital problems. [facility] Course:  Riley Martin is a 76 y.o. male with medical history significant for A fib on apixaban, hypertension, diabetes mellitus, severe COPD, chronic hypoxic respiratory failure on 4-6 L oxygen, GERD, depression, IBS, BPH, former smoker, CKD-3B, chronic neck and back pain pain on chronic opiate, who presented to the hospital with change in mental status, fever, cough, shortness of breath, headache and myalgia.  He was febrile in the emergency department with temperature of 102.6 F, tachycardic, tachypneic and requiring up to 6 L/min oxygen.   He was admitted to the hospital for severe sepsis secondary to influenza A pneumonia with acute on chronic hypoxic respiratory failure and acute metabolic encephalopathy.  He was treated with IV fluids, Tamiflu and empiric IV ceftriaxone and azithromycin.  He complained of chest pain while on admission.  Troponins were negative.  Chest pain has resolved.  His condition has improved and he feels back to baseline. He was able to ambulate in the hallway without any shortness of breath, chest pain or  oxygen desaturation.  He is deemed stable for discharge today.  Discharge plan was discussed with the patient and his wife at the bedside.  I went through discharge medication reconciliation with the patient and his wife.       Consultants: None Procedures performed: None  Disposition: Home Diet recommendation:  Discharge Diet Orders (From admission, onward)     Start     Ordered   05/04/22 0000  Diet - low sodium heart healthy        05/04/22 1443   05/04/22 0000  Diet Carb Modified        05/04/22 1443           Cardiac and Carb modified diet DISCHARGE MEDICATION: Allergies as of 05/04/2022       Reactions   Iodinated Contrast Media Other (See Comments), Nausea Only   Dry heaves, patient states he felt like he was on fire and about to pass out.  Pre-syncope, burning  Dry heaves, patient states he felt like he was on fire and about to pass out.    Lisinopril Swelling   Tongue swelling, neck pain and Headaches    Tramadol Other (See Comments), Shortness Of Breath   Other reaction(s): Other (See Comments) Other Reaction: tachy Dizziness and unsteady gait.   Pregabalin Palpitations   Other reaction(s): Other (See Comments) Other Reaction: edema        Medication List     STOP taking these medications    amoxicillin 500 MG capsule Commonly known as: AMOXIL   gabapentin 100 MG capsule Commonly known as: NEURONTIN   hyoscyamine  0.125 MG SL tablet Commonly known as: LEVSIN SL   levalbuterol 45 MCG/ACT inhaler Commonly known as: XOPENEX HFA   meclizine 12.5 MG tablet Commonly known as: ANTIVERT   megestrol 625 MG/5ML suspension Commonly known as: MEGACE ES   pantoprazole 20 MG tablet Commonly known as: PROTONIX   Rivaroxaban 15 MG Tabs tablet Commonly known as: XARELTO   tiotropium 18 MCG inhalation capsule Commonly known as: SPIRIVA       TAKE these medications    amiodarone 200 MG tablet Commonly known as: PACERONE Take 1 tablet (200 mg  total) by mouth 2 (two) times daily.   amoxicillin-clavulanate 500-125 MG tablet Commonly known as: Augmentin Take 1 tablet by mouth 2 (two) times daily for 2 days.   apixaban 2.5 MG Tabs tablet Commonly known as: ELIQUIS Take by mouth.   B-12 2500 MCG Subl Place 1 tablet under the tongue daily.   blood glucose meter kit and supplies Kit Dispense based on patient and insurance preference. Use up to four times daily as directed.   budesonide 1 MG/2ML nebulizer solution Commonly known as: PULMICORT Take 1 mg by nebulization daily. What changed: Another medication with the same name was removed. Continue taking this medication, and follow the directions you see here.   feeding supplement (NEPRO CARB STEADY) Liqd Take 237 mLs by mouth 3 (three) times daily between meals.   ferrous sulfate 325 (65 FE) MG tablet Take 1 tablet (325 mg total) by mouth 2 (two) times daily with a meal.   fluticasone 50 MCG/ACT nasal spray Commonly known as: FLONASE SPRAY 2 SPRAYS INTO EACH NOSTRIL EVERY DAY   furosemide 20 MG tablet Commonly known as: LASIX Take 1 tablet (20 mg total) by mouth 2 (two) times daily. Resume it after checking kidney function and after discussing with primary care doctor   hydrocortisone 2.5 % cream Apply topically.   ipratropium 0.02 % nebulizer solution Commonly known as: ATROVENT Take 0.5 mg by nebulization 2 (two) times daily.   metoprolol tartrate 25 MG tablet Commonly known as: LOPRESSOR Take 25 mg by mouth daily.   mirtazapine 15 MG tablet Commonly known as: REMERON Take 15 mg by mouth at bedtime.   mometasone-formoterol 200-5 MCG/ACT Aero Commonly known as: DULERA Inhale 2 puffs into the lungs 2 (two) times daily.   multivitamin with minerals Tabs tablet Take 1 tablet by mouth daily.   naloxone 4 MG/0.1ML Liqd nasal spray kit Commonly known as: NARCAN Place 0.4 mg into the nose once. CALL 911. SPR CONTENTS OF ONE SPRAYER (0.1ML) INTO ONE NOSTRIL.  REPEAT IN 2-3 MIN IF SYMPTOMS OF OPIOID EMERGENCY PERSIST, ALTERNATE NOSTRILS 11/22/2021   ondansetron 4 MG disintegrating tablet Commonly known as: ZOFRAN-ODT Take 4 mg by mouth every 8 (eight) hours as needed for nausea or vomiting.   oseltamivir 30 MG capsule Commonly known as: TAMIFLU Take 1 capsule (30 mg total) by mouth 2 (two) times daily for 4 doses.   oxyCODONE-acetaminophen 10-325 MG tablet Commonly known as: PERCOCET Take 1 tablet by mouth 5 (five) times daily as needed.   OxyCONTIN 10 mg 12 hr tablet Generic drug: oxyCODONE Take 10 mg by mouth 3 (three) times daily.   Pen Needles 31G X 6 MM Misc 1 each by Does not apply route 3 (three) times daily with meals.   sodium chloride 0.65 % Soln nasal spray Commonly known as: OCEAN Place 1 spray into both nostrils as needed for congestion.   spironolactone 25 MG tablet Commonly known as:  ALDACTONE Take 25 mg by mouth daily.   tamsulosin 0.4 MG Caps capsule Commonly known as: FLOMAX Take 0.4 mg by mouth daily.   Ventolin HFA 108 (90 Base) MCG/ACT inhaler Generic drug: albuterol Inhale 2 puffs into the lungs every 6 (six) hours as needed.   albuterol (2.5 MG/3ML) 0.083% nebulizer solution Commonly known as: PROVENTIL Take 3 mLs by nebulization every 6 (six) hours as needed for wheezing or shortness of breath.        Discharge Exam: Filed Weights   05/01/22 2159  Weight: 72.6 kg   GEN: NAD SKIN: Warm and dry EYES: No pallor or icterus ENT: MMM CV: RRR PULM: No wheezing or rales anterior ABD: soft, ND, NT, +BS CNS: AAO x 3, non focal EXT: No edema or tenderness   Condition at discharge: good  The results of significant diagnostics from this hospitalization (including imaging, microbiology, ancillary and laboratory) are listed below for reference.   Imaging Studies: DG Chest 1 View  Result Date: 05/01/2022 CLINICAL DATA:  Sepsis EXAM: CHEST  1 VIEW COMPARISON:  10/05/2021 FINDINGS: Stable  cardiomediastinal silhouette. Aortic atherosclerotic calcification. Chronic bronchitic change. Biapical pleural-parenchymal scarring. Increased interstitial thickening in the lower lungs compared with 10/05/2021 could be due to edema or infection. No pleural effusion or pneumothorax. No displaced rib fractures. IMPRESSION: COPD.  Question superimposed edema or infection in the lower lungs. Electronically Signed   By: Placido Sou M.D.   On: 05/01/2022 23:17   MR BRAIN/IAC W WO CONTRAST  Result Date: 04/25/2022 CLINICAL DATA:  Provided history: Sensorineural hearing loss, unilateral, left ear, with unrestricted hearing on the contralateral side. Dizziness and giddiness. Left ear pain. EXAM: MRI HEAD WITHOUT AND WITH CONTRAST TECHNIQUE: Multiplanar, multiecho pulse sequences of the brain and surrounding structures were obtained without and with intravenous contrast. CONTRAST:  17m MULTIHANCE GADOBENATE DIMEGLUMINE 529 MG/ML IV SOLN COMPARISON:  Brain MRI 04/01/2021. FINDINGS: Brain: Mild-to-moderate generalized cerebral atrophy. Multifocal T2 FLAIR hyperintense signal abnormality within the cerebral white matter, nonspecific but compatible with mild chronic small vessel ischemic disease. Partially empty sella turcica. No evidence of an intracranial mass. Specifically, no cerebellopontine angle or internal auditory canal mass is demonstrated. Unremarkable appearance of the 7th and 8th cranial nerves bilaterally. There is no acute infarct. No chronic intracranial blood products. No extra-axial fluid collection. No midline shift. No pathologic intracranial enhancement identified. Vascular: Maintained flow voids within the proximal large arterial vessels. Skull and upper cervical spine: No focal suspicious marrow lesion. Susceptibility artifact arising from ACDF hardware. Incompletely assessed cervical spondylosis. Ligamentous hypertrophy about the C1-C2 articulation. Sinuses/Orbits: No mass or acute finding  within the imaged orbits. Minimal mucosal thickening within the bilateral ethmoid and left frontal sinuses. Other: Left mastoid effusion. IMPRESSION: 1. No evidence of acute intracranial abnormality. 2. No cerebellopontine angle or internal auditory canal mass. 3. No specific cause of hearing loss is identified. 4. Mild chronic small vessel ischemic changes within the cerebral white matter, similar to the prior brain MRI of 04/01/2021. 5. Mild-to-moderate generalized cerebral atrophy. 6. Left mastoid effusion. Electronically Signed   By: KKellie SimmeringD.O.   On: 04/25/2022 14:32    Microbiology: Results for orders placed or performed during the hospital encounter of 05/01/22  Culture, blood (Routine x 2)     Status: None (Preliminary result)   Collection Time: 05/01/22 10:34 PM   Specimen: BLOOD RIGHT FOREARM  Result Value Ref Range Status   Specimen Description BLOOD RIGHT FOREARM  Final   Special Requests  Final    BOTTLES DRAWN AEROBIC AND ANAEROBIC Blood Culture adequate volume   Culture   Final    NO GROWTH 3 DAYS Performed at [facility], [address] [place], [place] [address]    Report Status PENDING  Incomplete  Culture, blood (Routine x 2)     Status: None (Preliminary result)   Collection Time: 05/01/22 10:34 PM   Specimen: BLOOD RIGHT WRIST  Result Value Ref Range Status   Specimen Description BLOOD RIGHT WRIST  Final   Special Requests   Final    BOTTLES DRAWN AEROBIC AND ANAEROBIC Blood Culture adequate volume   Culture   Final    NO GROWTH 3 DAYS Performed at [facility], [address], [place], [place] [address]    Report Status PENDING  Incomplete  Resp panel by RT-PCR (RSV, Flu A&B, Covid) Anterior Nasal Swab     Status: Abnormal   Collection Time: 05/01/22 10:40 PM   Specimen: Anterior Nasal Swab  Result Value Ref Range Status   SARS Coronavirus 2 by RT PCR NEGATIVE NEGATIVE Final    Comment: (NOTE) SARS-CoV-2 target nucleic acids are NOT  DETECTED.  The SARS-CoV-2 RNA is generally detectable in upper respiratory specimens during the acute phase of infection. The lowest concentration of SARS-CoV-2 viral copies this assay can detect is 138 copies/mL. A negative result does not preclude SARS-Cov-2 infection and should not be used as the sole basis for treatment or other patient management decisions. A negative result may occur with  improper specimen collection/handling, submission of specimen other than nasopharyngeal swab, presence of viral mutation(s) within the areas targeted by this assay, and inadequate number of viral copies(<138 copies/mL). A negative result must be combined with clinical observations, patient history, and epidemiological information. The expected result is Negative.  Fact Sheet for Patients:  EntrepreneurPulse.com.au  Fact Sheet for Healthcare Providers:  IncredibleEmployment.be  This test is no t yet approved or cleared by the [place] FDA and  has been authorized for detection and/or diagnosis of SARS-CoV-2 by FDA under an Emergency Use Authorization (EUA). This EUA will remain  in effect (meaning this test can be used) for the duration of the COVID-19 declaration under Section 564(b)(1) of the Act, 21 U.S.C.section 360bbb-3(b)(1), unless the authorization is terminated  or revoked sooner.       Influenza A by PCR POSITIVE (A) NEGATIVE Final   Influenza B by PCR NEGATIVE NEGATIVE Final    Comment: (NOTE) The Xpert Xpress SARS-CoV-2/FLU/RSV plus assay is intended as an aid in the diagnosis of influenza from Nasopharyngeal swab specimens and should not be used as a sole basis for treatment. Nasal washings and aspirates are unacceptable for Xpert Xpress SARS-CoV-2/FLU/RSV testing.  Fact Sheet for Patients: EntrepreneurPulse.com.au  Fact Sheet for Healthcare Providers: IncredibleEmployment.be  This test is not  yet approved or cleared by the [place] FDA and has been authorized for detection and/or diagnosis of SARS-CoV-2 by FDA under an Emergency Use Authorization (EUA). This EUA will remain in effect (meaning this test can be used) for the duration of the COVID-19 declaration under Section 564(b)(1) of the Act, 21 U.S.C. section 360bbb-3(b)(1), unless the authorization is terminated or revoked.     Resp Syncytial Virus by PCR NEGATIVE NEGATIVE Final    Comment: (NOTE) Fact Sheet for Patients: EntrepreneurPulse.com.au  Fact Sheet for Healthcare Providers: IncredibleEmployment.be  This test is not yet approved or cleared by the [place] FDA and has been authorized for detection and/or diagnosis  of SARS-CoV-2 by FDA under an Emergency Use Authorization (EUA). This EUA will remain in effect (meaning this test can be used) for the duration of the COVID-19 declaration under Section 564(b)(1) of the Act, 21 U.S.C. section 360bbb-3(b)(1), unless the authorization is terminated or revoked.  Performed at [facility], [address] [place], [place] [address]   Urine Culture     Status: None   Collection Time: 05/01/22 10:41 PM   Specimen: In/Out Cath Urine  Result Value Ref Range Status   Specimen Description   Final    IN/OUT CATH URINE Performed at [facility], [address] [place], [place] [address]    Special Requests   Final    NONE Performed at [facility], [address] [place], [place] [address]    Culture   Final    NO GROWTH Performed at [facility], [place] [address] [place], [place] [address]    Report Status 05/03/2022 FINAL  Final    Labs: CBC: Recent Labs  Lab 05/01/22 2234 05/03/22 0056  WBC 6.9 4.4  NEUTROABS 6.1 2.8  HGB 11.0* 9.0*  HCT 34.5* 28.2*  MCV 105.5* 107.2*  PLT 120* 97*   Basic Metabolic Panel: Recent Labs  Lab 05/01/22 2234 05/03/22 0056  NA 138 135   K 3.9 3.8  CL 100 104  CO2 27 26  GLUCOSE 104* 96  BUN 31* 25*  CREATININE 2.18* 1.70*  CALCIUM 8.7* 7.7*   Liver Function Tests: Recent Labs  Lab 05/01/22 2234  AST 26  ALT 19  ALKPHOS 41  BILITOT 1.1  PROT 6.6  ALBUMIN 3.5   CBG: Recent Labs  Lab 05/03/22 2143  GLUCAP 118*    Discharge time spent: greater than 30 minutes.  Signed: Jennye Boroughs, MD [facility] 05/04/2022

## 2022-05-06 LAB — CULTURE, BLOOD (ROUTINE X 2)
Culture: NO GROWTH
Culture: NO GROWTH
Special Requests: ADEQUATE
Special Requests: ADEQUATE

## 2022-05-12 ENCOUNTER — Other Ambulatory Visit: Payer: Self-pay | Admitting: Family Medicine

## 2022-05-12 DIAGNOSIS — Z8709 Personal history of other diseases of the respiratory system: Secondary | ICD-10-CM

## 2022-05-12 DIAGNOSIS — R1031 Right lower quadrant pain: Secondary | ICD-10-CM

## 2022-05-19 ENCOUNTER — Ambulatory Visit
Admission: RE | Admit: 2022-05-19 | Discharge: 2022-05-19 | Disposition: A | Discharge status: Home / Self Care | Payer: BC Managed Care – PPO | Source: Ambulatory Visit | Attending: Family Medicine | Admitting: Family Medicine

## 2022-05-19 DIAGNOSIS — R1031 Right lower quadrant pain: Secondary | ICD-10-CM | POA: Insufficient documentation

## 2022-05-19 DIAGNOSIS — Z8709 Personal history of other diseases of the respiratory system: Secondary | ICD-10-CM | POA: Diagnosis present

## 2022-06-09 ENCOUNTER — Inpatient Hospital Stay: Payer: BC Managed Care – PPO | Attending: Oncology

## 2022-06-09 DIAGNOSIS — Z888 Allergy status to other drugs, medicaments and biological substances status: Secondary | ICD-10-CM | POA: Insufficient documentation

## 2022-06-09 DIAGNOSIS — Z79899 Other long term (current) drug therapy: Secondary | ICD-10-CM | POA: Insufficient documentation

## 2022-06-09 DIAGNOSIS — Z91041 Radiographic dye allergy status: Secondary | ICD-10-CM | POA: Insufficient documentation

## 2022-06-09 DIAGNOSIS — G894 Chronic pain syndrome: Secondary | ICD-10-CM | POA: Insufficient documentation

## 2022-06-09 DIAGNOSIS — J449 Chronic obstructive pulmonary disease, unspecified: Secondary | ICD-10-CM | POA: Insufficient documentation

## 2022-06-09 DIAGNOSIS — I129 Hypertensive chronic kidney disease with stage 1 through stage 4 chronic kidney disease, or unspecified chronic kidney disease: Secondary | ICD-10-CM | POA: Insufficient documentation

## 2022-06-09 DIAGNOSIS — R0602 Shortness of breath: Secondary | ICD-10-CM | POA: Insufficient documentation

## 2022-06-09 DIAGNOSIS — Z85828 Personal history of other malignant neoplasm of skin: Secondary | ICD-10-CM | POA: Insufficient documentation

## 2022-06-09 DIAGNOSIS — E538 Deficiency of other specified B group vitamins: Secondary | ICD-10-CM | POA: Insufficient documentation

## 2022-06-09 DIAGNOSIS — E1122 Type 2 diabetes mellitus with diabetic chronic kidney disease: Secondary | ICD-10-CM | POA: Insufficient documentation

## 2022-06-09 DIAGNOSIS — N1832 Chronic kidney disease, stage 3b: Secondary | ICD-10-CM | POA: Insufficient documentation

## 2022-06-09 DIAGNOSIS — Z8616 Personal history of COVID-19: Secondary | ICD-10-CM | POA: Insufficient documentation

## 2022-06-09 DIAGNOSIS — K219 Gastro-esophageal reflux disease without esophagitis: Secondary | ICD-10-CM | POA: Insufficient documentation

## 2022-06-09 DIAGNOSIS — R5383 Other fatigue: Secondary | ICD-10-CM | POA: Insufficient documentation

## 2022-06-09 DIAGNOSIS — F32A Depression, unspecified: Secondary | ICD-10-CM | POA: Insufficient documentation

## 2022-06-09 DIAGNOSIS — K589 Irritable bowel syndrome without diarrhea: Secondary | ICD-10-CM | POA: Insufficient documentation

## 2022-06-09 DIAGNOSIS — K5520 Angiodysplasia of colon without hemorrhage: Secondary | ICD-10-CM | POA: Insufficient documentation

## 2022-06-09 DIAGNOSIS — Z87891 Personal history of nicotine dependence: Secondary | ICD-10-CM | POA: Insufficient documentation

## 2022-06-09 DIAGNOSIS — N400 Enlarged prostate without lower urinary tract symptoms: Secondary | ICD-10-CM | POA: Insufficient documentation

## 2022-06-09 DIAGNOSIS — D631 Anemia in chronic kidney disease: Secondary | ICD-10-CM | POA: Insufficient documentation

## 2022-06-09 DIAGNOSIS — Z7901 Long term (current) use of anticoagulants: Secondary | ICD-10-CM | POA: Insufficient documentation

## 2022-06-09 DIAGNOSIS — Z885 Allergy status to narcotic agent status: Secondary | ICD-10-CM | POA: Insufficient documentation

## 2022-06-09 MED FILL — Iron Sucrose Inj 20 MG/ML (Fe Equiv): INTRAVENOUS | Qty: 10 | Status: AC

## 2022-06-12 ENCOUNTER — Encounter: Payer: Self-pay | Admitting: Oncology

## 2022-06-12 ENCOUNTER — Inpatient Hospital Stay (HOSPITAL_BASED_OUTPATIENT_CLINIC_OR_DEPARTMENT_OTHER): Payer: BC Managed Care – PPO | Admitting: Oncology

## 2022-06-12 ENCOUNTER — Inpatient Hospital Stay: Payer: BC Managed Care – PPO

## 2022-06-12 VITALS — BP 117/98 | HR 70 | Temp 97.8°F | Wt 159.6 lb

## 2022-06-12 DIAGNOSIS — R5383 Other fatigue: Secondary | ICD-10-CM | POA: Diagnosis not present

## 2022-06-12 DIAGNOSIS — D631 Anemia in chronic kidney disease: Secondary | ICD-10-CM | POA: Diagnosis present

## 2022-06-12 DIAGNOSIS — E1122 Type 2 diabetes mellitus with diabetic chronic kidney disease: Secondary | ICD-10-CM | POA: Diagnosis not present

## 2022-06-12 DIAGNOSIS — F32A Depression, unspecified: Secondary | ICD-10-CM | POA: Diagnosis not present

## 2022-06-12 DIAGNOSIS — N1832 Chronic kidney disease, stage 3b: Secondary | ICD-10-CM

## 2022-06-12 DIAGNOSIS — K219 Gastro-esophageal reflux disease without esophagitis: Secondary | ICD-10-CM | POA: Diagnosis not present

## 2022-06-12 DIAGNOSIS — D7589 Other specified diseases of blood and blood-forming organs: Secondary | ICD-10-CM

## 2022-06-12 DIAGNOSIS — Z888 Allergy status to other drugs, medicaments and biological substances status: Secondary | ICD-10-CM | POA: Diagnosis not present

## 2022-06-12 DIAGNOSIS — Z91041 Radiographic dye allergy status: Secondary | ICD-10-CM | POA: Diagnosis not present

## 2022-06-12 DIAGNOSIS — G894 Chronic pain syndrome: Secondary | ICD-10-CM | POA: Diagnosis not present

## 2022-06-12 DIAGNOSIS — Z85828 Personal history of other malignant neoplasm of skin: Secondary | ICD-10-CM | POA: Diagnosis not present

## 2022-06-12 DIAGNOSIS — K5520 Angiodysplasia of colon without hemorrhage: Secondary | ICD-10-CM | POA: Diagnosis not present

## 2022-06-12 DIAGNOSIS — J449 Chronic obstructive pulmonary disease, unspecified: Secondary | ICD-10-CM | POA: Diagnosis not present

## 2022-06-12 DIAGNOSIS — N400 Enlarged prostate without lower urinary tract symptoms: Secondary | ICD-10-CM | POA: Diagnosis not present

## 2022-06-12 DIAGNOSIS — O280 Abnormal hematological finding on antenatal screening of mother: Secondary | ICD-10-CM | POA: Insufficient documentation

## 2022-06-12 DIAGNOSIS — E538 Deficiency of other specified B group vitamins: Secondary | ICD-10-CM | POA: Insufficient documentation

## 2022-06-12 DIAGNOSIS — Z87891 Personal history of nicotine dependence: Secondary | ICD-10-CM | POA: Diagnosis not present

## 2022-06-12 DIAGNOSIS — I129 Hypertensive chronic kidney disease with stage 1 through stage 4 chronic kidney disease, or unspecified chronic kidney disease: Secondary | ICD-10-CM | POA: Diagnosis not present

## 2022-06-12 DIAGNOSIS — Z885 Allergy status to narcotic agent status: Secondary | ICD-10-CM | POA: Diagnosis not present

## 2022-06-12 DIAGNOSIS — K589 Irritable bowel syndrome without diarrhea: Secondary | ICD-10-CM | POA: Diagnosis not present

## 2022-06-12 DIAGNOSIS — Z79899 Other long term (current) drug therapy: Secondary | ICD-10-CM | POA: Diagnosis not present

## 2022-06-12 DIAGNOSIS — Z7901 Long term (current) use of anticoagulants: Secondary | ICD-10-CM | POA: Diagnosis not present

## 2022-06-12 DIAGNOSIS — R0602 Shortness of breath: Secondary | ICD-10-CM | POA: Diagnosis not present

## 2022-06-12 DIAGNOSIS — Z8616 Personal history of COVID-19: Secondary | ICD-10-CM | POA: Diagnosis not present

## 2022-06-12 LAB — IRON AND TIBC
Iron: 77 ug/dL (ref 45–182)
Saturation Ratios: 25 % (ref 17.9–39.5)
TIBC: 308 ug/dL (ref 250–450)
UIBC: 231 ug/dL

## 2022-06-12 LAB — RETIC PANEL
Immature Retic Fract: 10.7 % (ref 2.3–15.9)
RBC.: 3.62 MIL/uL — ABNORMAL LOW (ref 4.22–5.81)
Retic Count, Absolute: 81.5 10*3/uL (ref 19.0–186.0)
Retic Ct Pct: 2.3 % (ref 0.4–3.1)
Reticulocyte Hemoglobin: 36.1 pg (ref >27.9)

## 2022-06-12 LAB — CBC WITH DIFFERENTIAL/PLATELET
Abs Immature Granulocytes: 0.15 10*3/uL — ABNORMAL HIGH (ref 0.00–0.07)
Basophils Absolute: 0 10*3/uL (ref 0.0–0.1)
Basophils Relative: 0 %
Eosinophils Absolute: 0.2 10*3/uL (ref 0.0–0.5)
Eosinophils Relative: 2 %
HCT: 37.8 % — ABNORMAL LOW (ref 39.0–52.0)
Hemoglobin: 12.3 g/dL — ABNORMAL LOW (ref 13.0–17.0)
Immature Granulocytes: 2 %
Lymphocytes Relative: 26 %
Lymphs Abs: 2.3 10*3/uL (ref 0.7–4.0)
MCH: 33.6 pg (ref 26.0–34.0)
MCHC: 32.5 g/dL (ref 30.0–36.0)
MCV: 103.3 fL — ABNORMAL HIGH (ref 80.0–100.0)
Monocytes Absolute: 0.9 10*3/uL (ref 0.1–1.0)
Monocytes Relative: 9 %
Neutro Abs: 5.6 10*3/uL (ref 1.7–7.7)
Neutrophils Relative %: 61 %
Platelets: 222 10*3/uL (ref 150–400)
RBC: 3.66 MIL/uL — ABNORMAL LOW (ref 4.22–5.81)
RDW: 12.8 % (ref 11.5–15.5)
WBC: 9.2 10*3/uL (ref 4.0–10.5)
nRBC: 0 % (ref 0.0–0.2)

## 2022-06-12 LAB — FOLATE: Folate: 21.4 ng/mL (ref >5.9)

## 2022-06-12 LAB — VITAMIN B12: Vitamin B-12: 606 pg/mL (ref 180–914)

## 2022-06-12 LAB — FERRITIN: Ferritin: 176 ng/mL (ref 24–336)

## 2022-06-12 NOTE — Progress Notes (Signed)
No Venofer today per MD. Discharged, stable

## 2022-06-12 NOTE — Progress Notes (Signed)
Hematology/Oncology Consult note [phone] [phone]    CHIEF COMPLAINTS/REASON FOR VISIT:  Follow up for anemia  ASSESSMENT & PLAN:   Anemia of chronic renal failure, stage 3b (HCC) #Iron deficiency anemia, anemia secondary to chronic kidney disease. Labs reviewed and discussed with patient.   Hemoglobin is normal, iron panel is stable and wnl.  Continue monitor. Continue oral iron supplementation   Low maternal serum vitamin B12 Vitamin B12 deficiency,B12 level has improved.  Continue weekly sublingual vitamin B12 2500 mcg   Orders Placed This Encounter  Procedures   Folate    Standing Status:   Future    Number of Occurrences:   1    Standing Expiration Date:   06/13/2023   Retic Panel    Standing Status:   Future    Number of Occurrences:   1    Standing Expiration Date:   06/13/2023   CBC with Differential/Platelet    Standing Status:   Future    Standing Expiration Date:   06/12/2023   Comprehensive metabolic panel    Standing Status:   Future    Standing Expiration Date:   06/12/2023   Iron and TIBC    Standing Status:   Future    Standing Expiration Date:   06/13/2023   Ferritin    Standing Status:   Future    Standing Expiration Date:   06/13/2023   Retic Panel    Standing Status:   Future    Standing Expiration Date:   06/13/2023   Vitamin B12    Standing Status:   Future    Standing Expiration Date:   06/13/2023   Follow up in 6 months.  All questions were answered. The patient knows to call the clinic with any problems, questions or concerns.  Earlie Server, MD, PhD [facility] Hematology Oncology 06/12/2022   HISTORY OF PRESENTING ILLNESS:  Patient has multiple medical problems,   A fib on Xarelto, hypertension, diabetes mellitus, severe COPD on 4-6 L oxygen, GERD, depression, IBS, BPH, former smoker, CKD, chronic pain syndrome etc.  Hospitalized from 05/10/21 - 05/13/21 due to dizziness/lightlessness, acute drop of hemoglobin to 5.7.  He was  transfused multiple units of PRBCs during his hospitalization, and IV iron.  He underwent EGD- which is not remarkable.  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 His Xarelto was held during admission and resumed.  He continues to feel week and was referred to hematology for further evaluation of anemia.  Accompanied by wife. Appetite is good.    INTERVAL HISTORY Riley Martin is a 77 y.o. male who has above history reviewed by me today presents for follow up visit for anemia.  He has had IV venofer treatments. Tolerated well.   Review of Systems  Constitutional:  Positive for fatigue. Negative for appetite change, chills, fever and unexpected weight change.  HENT:   Negative for hearing loss and voice change.   Eyes:  Negative for eye problems and icterus.  Respiratory:  Positive for shortness of breath. Negative for chest tightness and cough.   Cardiovascular:  Negative for chest pain and leg swelling.  Gastrointestinal:  Negative for abdominal distention and abdominal pain.  Endocrine: Negative for hot flashes.  Genitourinary:  Negative for difficulty urinating, dysuria and frequency.   Musculoskeletal:  Negative for arthralgias.  Skin:  Negative for itching and rash.  Neurological:  Negative for light-headedness and numbness.  Hematological:  Negative for adenopathy. Does not bruise/bleed easily.  Psychiatric/Behavioral:  Negative for confusion.     MEDICAL HISTORY:  Past Medical History:  Diagnosis Date   Arthritis    BPH (benign prostatic hyperplasia)    Cancer (HCC)    skin cancer   Chronic pain    COPD (chronic obstructive pulmonary disease) (HCC)    COVID    GERD (gastroesophageal reflux disease)    Headache    History of insomnia    IBS (irritable bowel syndrome)    Iron deficiency anemia due to chronic blood loss 07/12/2021   Orthostatic dizziness    PONV (postoperative nausea and vomiting)    Spinal stenosis    Torn  rotator cuff    left    SURGICAL HISTORY: Past Surgical History:  Procedure Laterality Date   COLONOSCOPY     COLONOSCOPY WITH PROPOFOL N/A 05/13/2021   Procedure: COLONOSCOPY WITH PROPOFOL;  Surgeon: Jonathon Bellows, MD;  Location: [facility] ENDOSCOPY;  Service: Gastroenterology;  Laterality: N/A;   ESOPHAGOGASTRODUODENOSCOPY N/A 03/14/2021   Procedure: ESOPHAGOGASTRODUODENOSCOPY (EGD);  Surgeon: [name], DO;  Location: [facility] ENDOSCOPY;  Service: Gastroenterology;  Laterality: N/A;   ESOPHAGOGASTRODUODENOSCOPY N/A 05/13/2021   Procedure: ESOPHAGOGASTRODUODENOSCOPY (EGD);  Surgeon: Jonathon Bellows, MD;  Location: [facility] ENDOSCOPY;  Service: Gastroenterology;  Laterality: N/A;   EXCISION CHONCHA BULLOSA Bilateral 06/02/2015   Procedure: BILATERAL CHONCHA BULLOSA;  Surgeon: Carloyn Manner, MD;  Location: [place];  Service: ENT;  Laterality: Bilateral;   HERNIA REPAIR     MAXILLARY ANTROSTOMY Bilateral 06/02/2015   Procedure: MAXILLARY ANTROSTOMY;  Surgeon: Carloyn Manner, MD;  Location: [place];  Service: ENT;  Laterality: Bilateral;   NASAL POLYP SURGERY     NECK SURGERY  05/08/2006   no limitations   UPPER GASTROINTESTINAL ENDOSCOPY      SOCIAL HISTORY: Social History   Socioeconomic History   Marital status: Married    Spouse name: Not on file   Number of children: Not on file   Years of education: Not on file   Highest education level: Not on file  Occupational History   Not on file  Tobacco Use   Smoking status: Former    Packs/day: 1.00    Years: 57.00    Total pack years: 57.00    Types: Cigarettes    Quit date: 2020    Years since quitting: 4.0   Smokeless tobacco: Never  Vaping Use   Vaping Use: Never used  Substance and Sexual Activity   Alcohol use: No   Drug use: No   Sexual activity: Not on file  Other Topics Concern   Not on file  Social History Narrative   Not on file   Social Determinants of Health   Financial Resource  Strain: Not on file  Food Insecurity: Not on file  Transportation Needs: Not on file  Physical Activity: Not on file  Stress: Not on file  Social Connections: Not on file  Intimate Partner Violence: Not on file    FAMILY HISTORY: History reviewed. No pertinent family history.  ALLERGIES:  is allergic to iodinated contrast media, lisinopril, tramadol, and pregabalin.  MEDICATIONS:  Current Outpatient Medications  Medication Sig Dispense Refill   albuterol (PROVENTIL) (2.5 MG/3ML) 0.083% nebulizer solution Take 3 mLs by nebulization every 6 (six) hours as needed for wheezing or shortness of breath.     amiodarone (PACERONE) 200 MG tablet Take 1 tablet (200 mg total) by mouth 2 (two) times daily. 60 tablet 1   apixaban (ELIQUIS) 2.5 MG TABS tablet Take by mouth.  blood glucose meter kit and supplies KIT Dispense based on patient and insurance preference. Use up to four times daily as directed. 1 each 0   ferrous sulfate 325 (65 FE) MG tablet Take 1 tablet (325 mg total) by mouth 2 (two) times daily with a meal. 60 tablet 11   furosemide (LASIX) 20 MG tablet Take 1 tablet (20 mg total) by mouth 2 (two) times daily. Resume it after checking kidney function and after discussing with primary care doctor 30 tablet    metoprolol tartrate (LOPRESSOR) 25 MG tablet Take 25 mg by mouth daily.     Multiple Vitamin (MULTIVITAMIN WITH MINERALS) TABS tablet Take 1 tablet by mouth daily.     oxyCODONE-acetaminophen (PERCOCET) 10-325 MG tablet Take 1 tablet by mouth 5 (five) times daily as needed.     OXYCONTIN 10 MG 12 hr tablet Take 10 mg by mouth 3 (three) times daily.     sodium chloride (OCEAN) 0.65 % SOLN nasal spray Place 1 spray into both nostrils as needed for congestion. 30 mL 1   spironolactone (ALDACTONE) 25 MG tablet Take 25 mg by mouth daily.     tamsulosin (FLOMAX) 0.4 MG CAPS capsule Take 0.4 mg by mouth daily.     VENTOLIN HFA 108 (90 Base) MCG/ACT inhaler Inhale 2 puffs into the lungs  every 6 (six) hours as needed.     budesonide (PULMICORT) 1 MG/2ML nebulizer solution Take 1 mg by nebulization daily. (Patient not taking: Reported on 05/02/2022)     Cyanocobalamin (B-12) 2500 MCG SUBL Place 1 tablet under the tongue daily. (Patient not taking: Reported on 05/02/2022) 90 tablet 1   fluticasone (FLONASE) 50 MCG/ACT nasal spray SPRAY 2 SPRAYS INTO EACH NOSTRIL EVERY DAY (Patient not taking: Reported on 05/02/2022) 16 g 0   hydrocortisone 2.5 % cream Apply topically. (Patient not taking: Reported on 05/02/2022)     Insulin Pen Needle (PEN NEEDLES) 31G X 6 MM MISC 1 each by Does not apply route 3 (three) times daily with meals. (Patient not taking: Reported on 07/12/2021) 100 each 0   ipratropium (ATROVENT) 0.02 % nebulizer solution Take 0.5 mg by nebulization 2 (two) times daily. (Patient not taking: Reported on 05/02/2022)     mirtazapine (REMERON) 15 MG tablet Take 15 mg by mouth at bedtime. (Patient not taking: Reported on 05/02/2022)     mometasone-formoterol (DULERA) 200-5 MCG/ACT AERO Inhale 2 puffs into the lungs 2 (two) times daily. (Patient not taking: Reported on 06/12/2022) 1 each 1   naloxone (NARCAN) nasal spray 4 mg/0.1 mL Place 0.4 mg into the nose once. CALL 911. SPR CONTENTS OF ONE SPRAYER (0.1ML) INTO ONE NOSTRIL. REPEAT IN 2-3 MIN IF SYMPTOMS OF OPIOID EMERGENCY PERSIST, ALTERNATE NOSTRILS 11/22/2021 (Patient not taking: Reported on 06/12/2022)     Nutritional Supplements (FEEDING SUPPLEMENT, NEPRO CARB STEADY,) LIQD Take 237 mLs by mouth 3 (three) times daily between meals. (Patient not taking: Reported on 06/12/2022)  0   ondansetron (ZOFRAN-ODT) 4 MG disintegrating tablet Take 4 mg by mouth every 8 (eight) hours as needed for nausea or vomiting. (Patient not taking: Reported on 06/12/2022)     No current facility-administered medications for this visit.     PHYSICAL EXAMINATION: ECOG PERFORMANCE STATUS: 2 - Symptomatic, <50% confined to bed Vitals:   06/12/22 1445   BP: (!) 117/98  Pulse: 70  Temp: 97.8 F (36.6 C)  SpO2: 95%   Filed Weights   06/12/22 1445  Weight: 159 lb 9.6 oz (72.4  kg)    Physical Exam Constitutional:      General: He is not in acute distress.    Comments: Patient sits in the wheelchair.   HENT:     Head: Normocephalic and atraumatic.  Eyes:     General: No scleral icterus. Cardiovascular:     Rate and Rhythm: Normal rate and regular rhythm.     Heart sounds: Normal heart sounds.  Pulmonary:     Effort: Pulmonary effort is normal. No respiratory distress.     Breath sounds: No wheezing.     Comments: Severely decreased breath sound.  Abdominal:     General: Bowel sounds are normal. There is no distension.     Palpations: Abdomen is soft.  Musculoskeletal:        General: No deformity. Normal range of motion.     Cervical back: Normal range of motion and neck supple.  Skin:    General: Skin is warm and dry.     Coloration: Skin is pale.     Findings: No erythema or rash.  Neurological:     Mental Status: He is alert and oriented to person, place, and time. Mental status is at baseline.     Cranial Nerves: No cranial nerve deficit.     Coordination: Coordination normal.  Psychiatric:        Mood and Affect: Mood normal.     LABORATORY DATA:  I have reviewed the data as listed    Latest Ref Rng & Units 06/12/2022    3:25 PM 05/03/2022   12:56 AM 05/01/2022   10:34 PM  CBC  WBC 4.0 - 10.5 K/uL 9.2  4.4  6.9   Hemoglobin 13.0 - 17.0 g/dL 12.3  9.0  11.0   Hematocrit 39.0 - 52.0 % 37.8  28.2  34.5   Platelets 150 - 400 K/uL 222  97  120       Latest Ref Rng & Units 05/03/2022   12:56 AM 05/01/2022   10:34 PM 07/12/2021    3:55 PM  CMP  Glucose 70 - 99 mg/dL 96  104  103   BUN 8 - 23 mg/dL 25  31  41   Creatinine 0.61 - 1.24 mg/dL 1.70  2.18  2.00   Sodium 135 - 145 mmol/L 135  138  133   Potassium 3.5 - 5.1 mmol/L 3.8  3.9  5.0   Chloride 98 - 111 mmol/L 104  100  101   CO2 22 - 32 mmol/L '26  27   26   '$ Calcium 8.9 - 10.3 mg/dL 7.7  8.7  8.7   Total Protein 6.5 - 8.1 g/dL  6.6  7.1   Total Bilirubin 0.3 - 1.2 mg/dL  1.1  0.3   Alkaline Phos 38 - 126 U/L  41  42   AST 15 - 41 U/L  26  18   ALT 0 - 44 U/L  19  21      Iron/TIBC/Ferritin/ %Sat    Component Value Date/Time   IRON 77 06/12/2022 1525   TIBC 308 06/12/2022 1525   FERRITIN 176 06/12/2022 1525   IRONPCTSAT 25 06/12/2022 1525      RADIOGRAPHIC STUDIES: I have personally reviewed the radiological images as listed and agreed with the findings in the report. CT ABDOMEN PELVIS WO CONTRAST  Result Date: 05/21/2022 CLINICAL DATA:  Abdominal pain, worst in the right lower quadrant. EXAM: CT ABDOMEN AND PELVIS WITHOUT CONTRAST TECHNIQUE: Multidetector CT imaging of the  abdomen and pelvis was performed following the standard protocol without IV contrast. RADIATION DOSE REDUCTION: This exam was performed according to the departmental dose-optimization program which includes automated exposure control, adjustment of the mA and/or kV according to patient size and/or use of iterative reconstruction technique. COMPARISON:  11/17/2019 FINDINGS: Lower chest: No acute findings. Hepatobiliary: No mass visualized on this unenhanced exam. Gallbladder is unremarkable. No evidence of biliary ductal dilatation. Pancreas: No mass or inflammatory process visualized on this unenhanced exam. Spleen:  Within normal limits in size. Adrenals/Urinary tract: Mild diffuse bilateral renal parenchymal atrophy noted. 2 mm calculus noted in upper pole of left kidney. No evidence of ureteral calculi or hydronephrosis. Unremarkable unopacified urinary bladder. Stomach/Bowel: No evidence of obstruction, inflammatory process, or abnormal fluid collections. Diverticulosis is seen mainly involving the sigmoid colon, however there is no evidence of diverticulitis. Vascular/Lymphatic: No pathologically enlarged lymph nodes identified. No evidence of abdominal aortic  aneurysm. Aortic atherosclerotic calcification incidentally noted. Reproductive:  No mass or other significant abnormality. Other:  None. Musculoskeletal:  No suspicious bone lesions identified. IMPRESSION: No acute findings. Tiny left renal calculus. No evidence of ureteral calculi or hydronephrosis. Sigmoid diverticulosis, without radiographic evidence of diverticulitis. Electronically Signed   By: Marlaine Hind M.D.   On: 05/21/2022 14:09

## 2022-06-12 NOTE — Assessment & Plan Note (Addendum)
#  Iron deficiency anemia, anemia secondary to chronic kidney disease. Labs reviewed and discussed with patient.   Hemoglobin is normal, iron panel is stable and wnl.  Continue monitor. Continue oral iron supplementation

## 2022-06-12 NOTE — Assessment & Plan Note (Signed)
Vitamin B12 deficiency,B12 level has improved.  Continue weekly sublingual vitamin B12 2500 mcg

## 2022-09-10 IMAGING — DX DG ABD PORTABLE 1V
2 series · 2 of 2 positions shown · non-contrast
Comparison: 11/17/2019

CLINICAL DATA: Nausea and vomiting

EXAM:
PORTABLE ABDOMEN - 1 VIEW

[abdomen supine (1 of 2)]
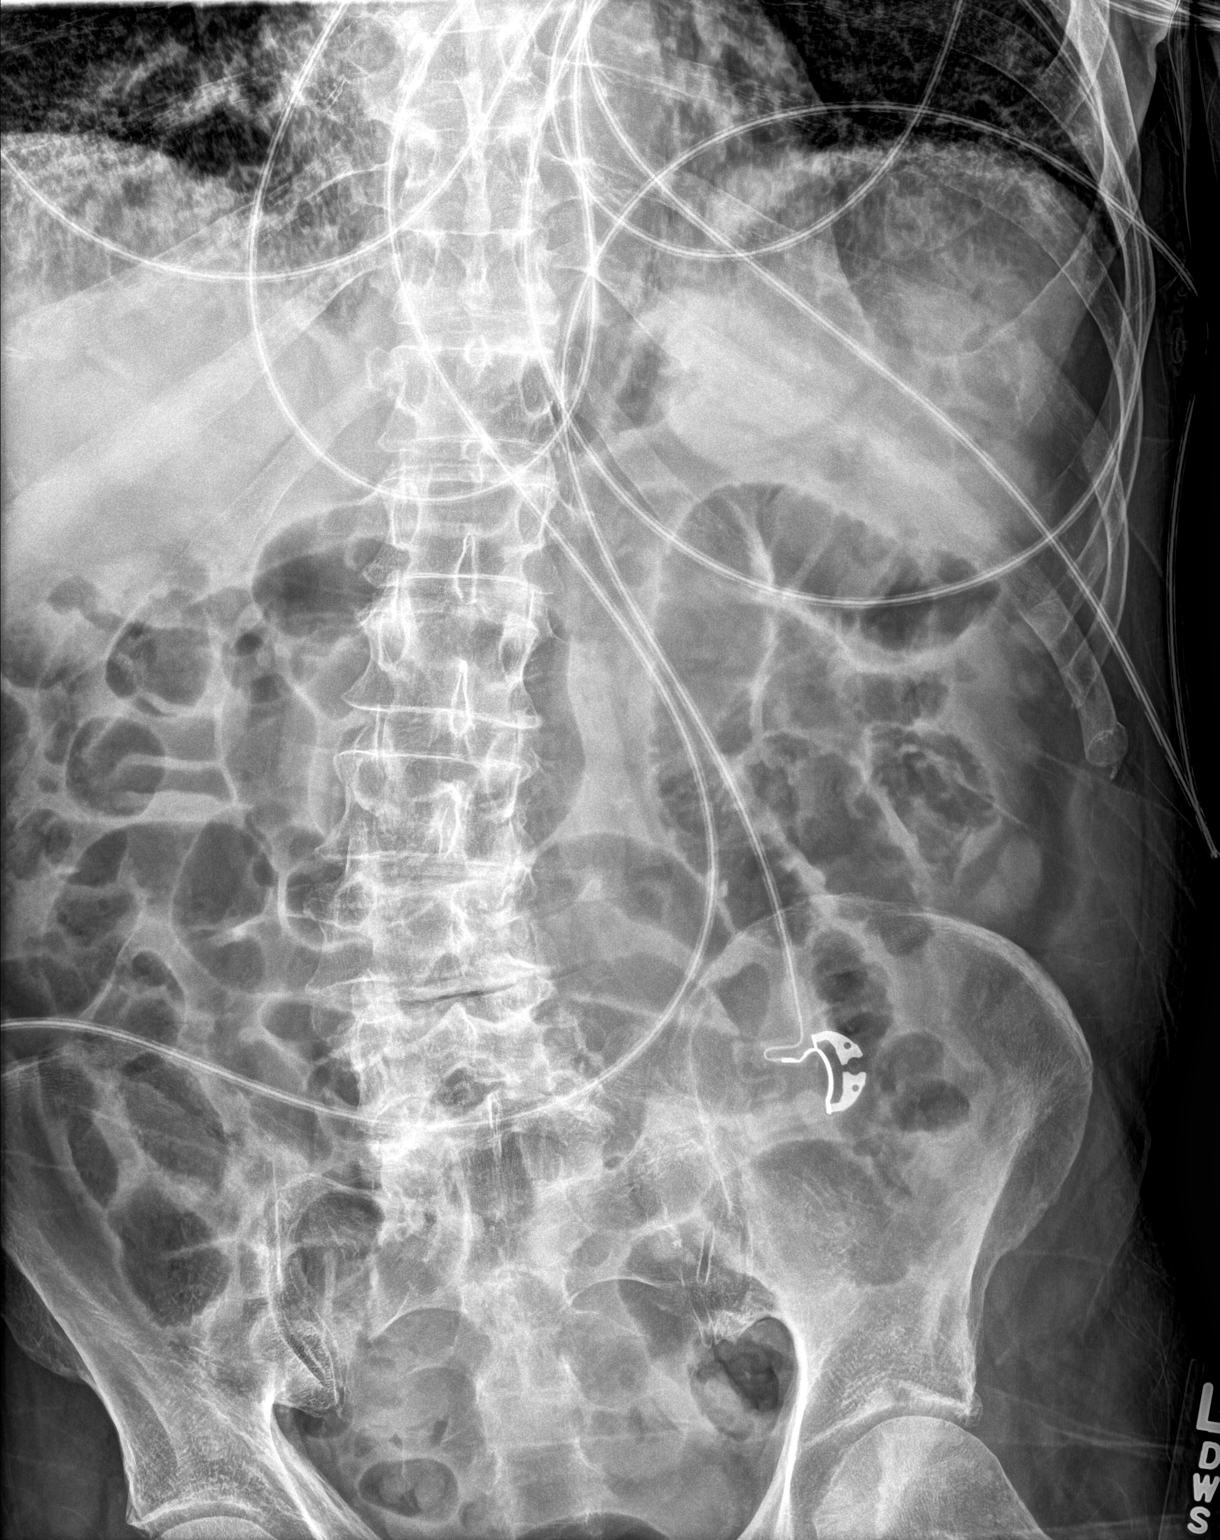

[abdomen supine (2 of 2)]
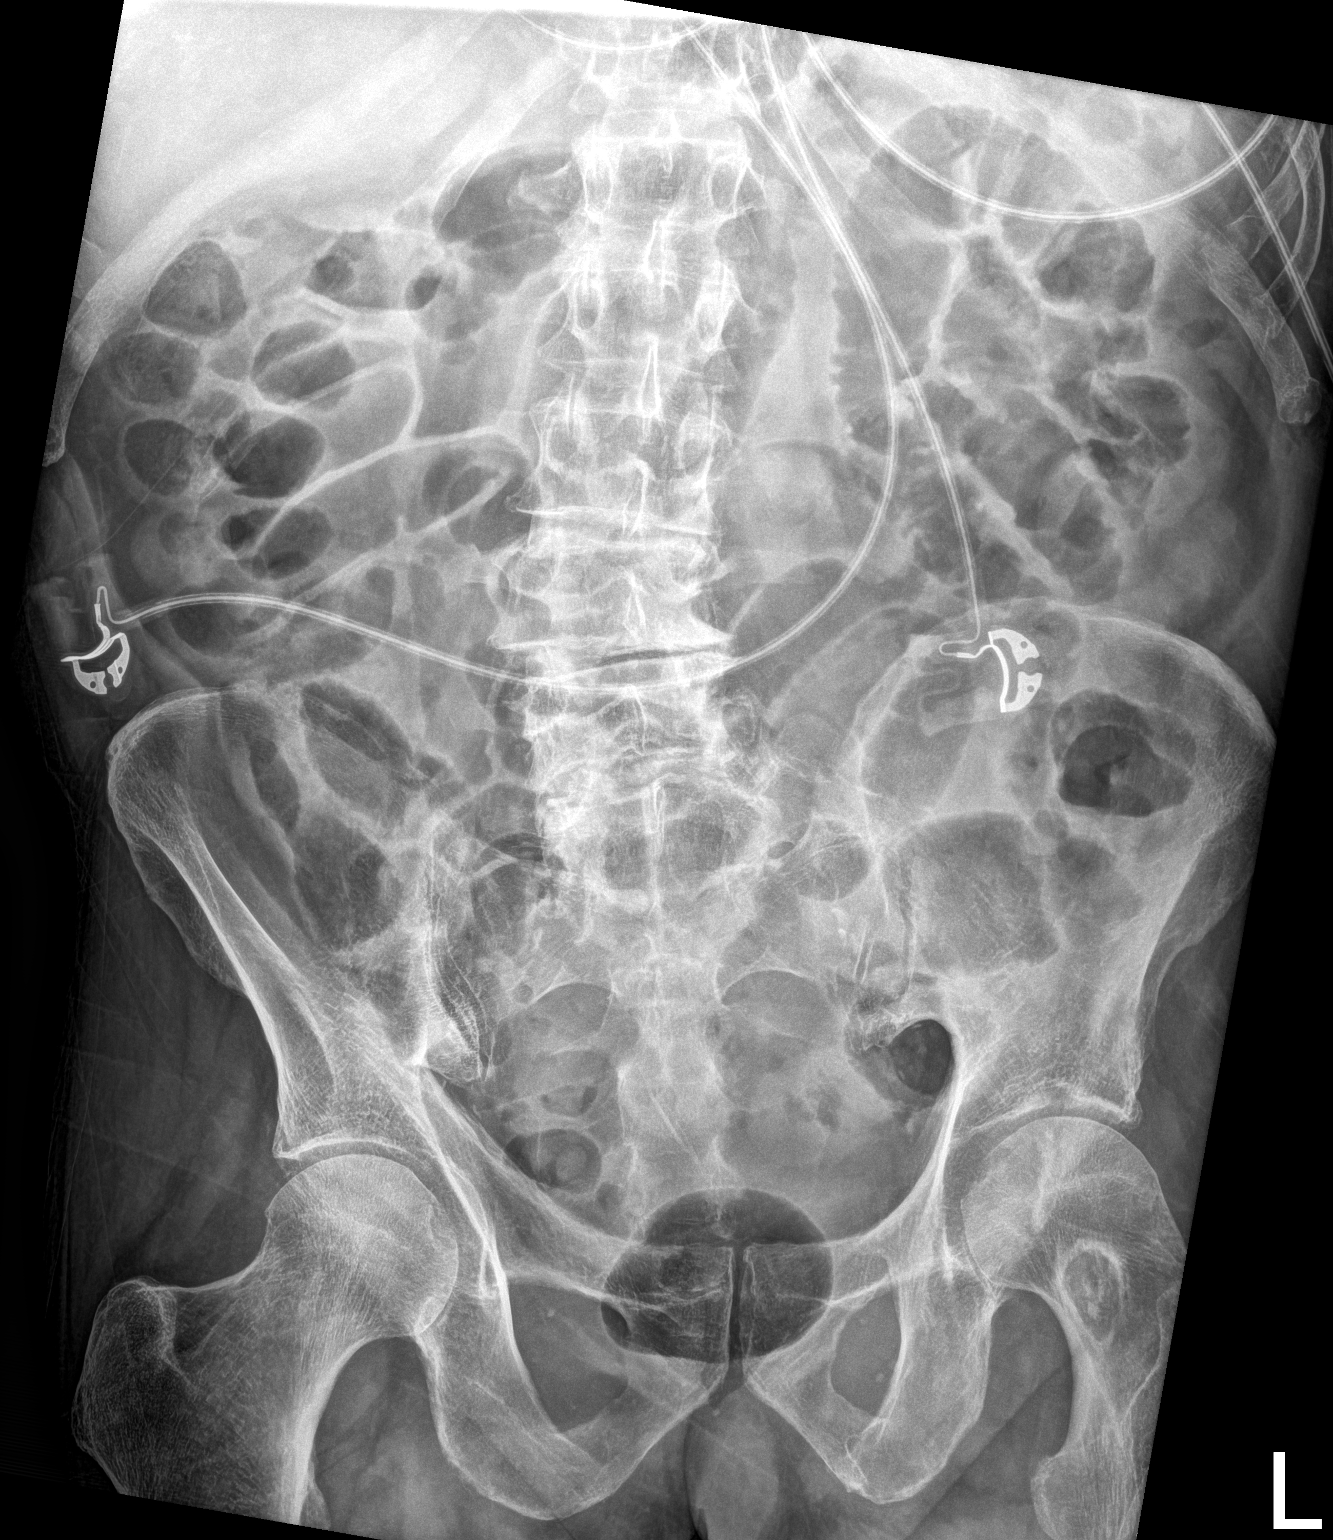

[2 of 2 positions shown; findings below may reference images not displayed]

FINDINGS: 2 supine frontal views of the abdomen and pelvis are obtained.
Portions of the right flank and right hemidiaphragm are excluded by
collimation. The bowel gas pattern is unremarkable without
obstruction or ileus. No masses or abnormal calcifications. Patchy
bibasilar airspace disease again noted, not appreciably changed
since preceding chest x-ray.
IMPRESSION: 1. Unremarkable bowel gas pattern.
2. Bibasilar airspace disease unchanged.

## 2022-10-08 ENCOUNTER — Emergency Department
Admission: EM | Admit: 2022-10-08 | Discharge: 2022-10-08 | Disposition: A | Discharge status: Home / Self Care | Payer: BC Managed Care – PPO | Attending: Emergency Medicine | Admitting: Emergency Medicine

## 2022-10-08 ENCOUNTER — Other Ambulatory Visit: Payer: Self-pay

## 2022-10-08 ENCOUNTER — Emergency Department: Payer: BC Managed Care – PPO

## 2022-10-08 DIAGNOSIS — J9611 Chronic respiratory failure with hypoxia: Secondary | ICD-10-CM | POA: Diagnosis not present

## 2022-10-08 DIAGNOSIS — Z9981 Dependence on supplemental oxygen: Secondary | ICD-10-CM | POA: Diagnosis not present

## 2022-10-08 DIAGNOSIS — Z8616 Personal history of COVID-19: Secondary | ICD-10-CM | POA: Diagnosis not present

## 2022-10-08 DIAGNOSIS — R0602 Shortness of breath: Secondary | ICD-10-CM | POA: Diagnosis present

## 2022-10-08 DIAGNOSIS — J441 Chronic obstructive pulmonary disease with (acute) exacerbation: Secondary | ICD-10-CM | POA: Insufficient documentation

## 2022-10-08 LAB — BASIC METABOLIC PANEL
Anion gap: 9 (ref 5–15)
BUN: 43 mg/dL — ABNORMAL HIGH (ref 8–23)
CO2: 28 mmol/L (ref 22–32)
Calcium: 8.7 mg/dL — ABNORMAL LOW (ref 8.9–10.3)
Chloride: 101 mmol/L (ref 98–111)
Creatinine, Ser: 2.07 mg/dL — ABNORMAL HIGH (ref 0.61–1.24)
GFR, Estimated: 32 mL/min — ABNORMAL LOW (ref >60)
Glucose, Bld: 101 mg/dL — ABNORMAL HIGH (ref 70–99)
Potassium: 4.5 mmol/L (ref 3.5–5.1)
Sodium: 138 mmol/L (ref 135–145)

## 2022-10-08 LAB — CBC
HCT: 42.2 % (ref 39.0–52.0)
Hemoglobin: 13.6 g/dL (ref 13.0–17.0)
MCH: 34.6 pg — ABNORMAL HIGH (ref 26.0–34.0)
MCHC: 32.2 g/dL (ref 30.0–36.0)
MCV: 107.4 fL — ABNORMAL HIGH (ref 80.0–100.0)
Platelets: 300 10*3/uL (ref 150–400)
RBC: 3.93 MIL/uL — ABNORMAL LOW (ref 4.22–5.81)
RDW: 13.3 % (ref 11.5–15.5)
WBC: 9.9 10*3/uL (ref 4.0–10.5)
nRBC: 0 % (ref 0.0–0.2)

## 2022-10-08 LAB — TROPONIN I (HIGH SENSITIVITY)
Troponin I (High Sensitivity): 9 ng/L (ref <18)
Troponin I (High Sensitivity): 9 ng/L (ref <18)

## 2022-10-08 MED ORDER — ALBUTEROL SULFATE (2.5 MG/3ML) 0.083% IN NEBU
5.0000 mg | INHALATION_SOLUTION | Freq: Once | RESPIRATORY_TRACT | Status: AC
  Administered 2022-10-08: 5 mg via RESPIRATORY_TRACT
  Filled 2022-10-08: qty 6

## 2022-10-08 MED ORDER — GUAIFENESIN ER 600 MG PO TB12: 600.0000 mg | ORAL_TABLET | Freq: Two times a day (BID) | ORAL | 0 refills | Status: AC

## 2022-10-08 MED ORDER — DOXYCYCLINE HYCLATE 100 MG PO TABS
100.0000 mg | ORAL_TABLET | Freq: Once | ORAL | Status: AC
  Administered 2022-10-08: 100 mg via ORAL
  Filled 2022-10-08: qty 1

## 2022-10-08 MED ORDER — ALBUTEROL SULFATE (2.5 MG/3ML) 0.083% IN NEBU: 3.0000 mL | INHALATION_SOLUTION | Freq: Four times a day (QID) | RESPIRATORY_TRACT | 0 refills | Status: AC | PRN

## 2022-10-08 MED ORDER — PREDNISONE 20 MG PO TABS
40.0000 mg | ORAL_TABLET | ORAL | Status: AC
  Administered 2022-10-08: 40 mg via ORAL
  Filled 2022-10-08: qty 2

## 2022-10-08 MED ORDER — PREDNISONE 10 MG PO TABS: ORAL_TABLET | ORAL | 0 refills | Status: AC

## 2022-10-08 MED ORDER — DOXYCYCLINE HYCLATE 100 MG PO CAPS: 100.0000 mg | ORAL_CAPSULE | Freq: Two times a day (BID) | ORAL | 0 refills | Status: AC

## 2022-10-08 MED ORDER — IPRATROPIUM-ALBUTEROL 0.5-2.5 (3) MG/3ML IN SOLN
3.0000 mL | Freq: Once | RESPIRATORY_TRACT | Status: AC
  Administered 2022-10-08: 3 mL via RESPIRATORY_TRACT
  Filled 2022-10-08: qty 3

## 2022-10-08 NOTE — ED Triage Notes (Signed)
Pt wife reports pt with a lot of chest congestion and stood up and got dizzy. Pt wife reports pt is dealing with long covid and has been dx'd with POTS. Pt is usually on 4L of oxygen and remains on 4L at this time.

## 2022-10-08 NOTE — ED Provider Notes (Signed)
[facility] Provider Note    Event Date/Time   First MD Initiated Contact with Patient 10/08/22 1909     (approximate)   History   Chief Complaint: Cough, Shortness of Breath, and Dizziness   HPI  Riley Martin is a 77 y.o. male with a history of COPD, GERD, long COVID who comes ED complaining of chest congestion and shortness of breath that been ongoing for the past 2 months.  He previously had contacted his pulmonologist Dr. Yvone Neu who prescribed amoxicillin and prednisone which did help alleviate his symptoms, but they have now gotten worse again.  Patient is on 4 L nasal cannula chronically.  Denies chest pain.  No hemoptysis.  No fever.  No leg swelling or calf pain.     Physical Exam   Triage Vital Signs: ED Triage Vitals  Enc Vitals Group     BP 10/08/22 1434 (!) 193/88     Pulse Rate 10/08/22 1434 71     Resp 10/08/22 1434 (!) 21     Temp 10/08/22 1434 99.3 F (37.4 C)     Temp Source 10/08/22 1434 Oral     SpO2 10/08/22 1434 98 %     Weight 10/08/22 1432 160 lb (72.6 kg)     Height 10/08/22 1432 6' (1.829 m)     Head Circumference --      Peak Flow --      Pain Score 10/08/22 1431 0     Pain Loc --      Pain Edu? --      Excl. in GC? --     Most recent vital signs: Vitals:   10/08/22 2155 10/08/22 2230  BP: (!) 141/78 137/72  Pulse: 69 78  Resp: 18 20  Temp:  98.7 F (37.1 C)  SpO2: 99% 100%    General: Awake, no distress.  CV:  Good peripheral perfusion.  Regular rate and rhythm Resp:  Normal effort.  Diffuse expiratory wheezing and prolonged expiratory phase, accentuated by FEV1 maneuver.  No inspiratory crackles. Abd:  No distention.  Soft nontender Other:  Moist oral mucosa.   ED Results / Procedures / Treatments   Labs (all labs ordered are listed, but only abnormal results are displayed) Labs Reviewed  BASIC METABOLIC PANEL - Abnormal; Notable for the following components:      Result Value   Glucose,  Bld 101 (*)    BUN 43 (*)    Creatinine, Ser 2.07 (*)    Calcium 8.7 (*)    GFR, Estimated 32 (*)    All other components within normal limits  CBC - Abnormal; Notable for the following components:   RBC 3.93 (*)    MCV 107.4 (*)    MCH 34.6 (*)    All other components within normal limits  TROPONIN I (HIGH SENSITIVITY)  TROPONIN I (HIGH SENSITIVITY)     EKG Interpreted by me Sinus rhythm rate of 72.  Normal axis, right bundle branch block.  No acute ischemic changes.   RADIOLOGY Chest x-ray interpreted by me, negative for pneumonia or pneumothorax or pleural effusion.  Radiology report reviewed.   PROCEDURES:  Procedures   MEDICATIONS ORDERED IN ED: Medications  predniSONE (DELTASONE) tablet 40 mg (40 mg Oral Given 10/08/22 2153)  ipratropium-albuterol (DUONEB) 0.5-2.5 (3) MG/3ML nebulizer solution 3 mL (3 mLs Nebulization Given 10/08/22 2153)  albuterol (PROVENTIL) (2.5 MG/3ML) 0.083% nebulizer solution 5 mg (5 mg Nebulization Given 10/08/22 2153)  doxycycline (VIBRA-TABS) tablet  100 mg (100 mg Oral Given 10/08/22 2254)     IMPRESSION / MDM / ASSESSMENT AND PLAN / ED COURSE  I reviewed the triage vital signs and the nursing notes.  DDx: COPD exacerbation, pneumonia, pulmonary edema, pleural effusion, non-STEMI, AKI, electrolyte abnormality, anemia.  Low suspicion for pulmonary embolism  Patient's presentation is most consistent with acute presentation with potential threat to life or bodily function.  Patient presents with shortness of breath, increased cough and sputum production, exam can stay with COPD exacerbation.  Nontoxic, vitals unremarkable.  Patient given bronchodilators and prednisone in the ED, feels much better.  On repeat auscultation lung exam is improved.  Offered admission but patient feels comfortable with outpatient management.  Labs including serial troponins are unremarkable.  Will prescribe a longer prednisone taper along with doxycycline        FINAL CLINICAL IMPRESSION(S) / ED DIAGNOSES   Final diagnoses:  COPD exacerbation (HCC)  Chronic respiratory failure with hypoxia (HCC)     Rx / DC Orders   ED Discharge Orders          Ordered    doxycycline (VIBRAMYCIN) 100 MG capsule  2 times daily        10/08/22 2244    predniSONE (DELTASONE) 10 MG tablet  Daily        10/08/22 2244    guaiFENesin (MUCINEX) 600 MG 12 hr tablet  2 times daily        10/08/22 2244    albuterol (PROVENTIL) (2.5 MG/3ML) 0.083% nebulizer solution  Every 6 hours PRN        10/08/22 2244             Note:  This document was prepared using Dragon voice recognition software and may include unintentional dictation errors.   Sharman Cheek, MD 10/09/22 0040

## 2022-10-08 NOTE — ED Notes (Signed)
Pt provided discharge instructions and prescription information. Pt was given the opportunity to ask questions and questions were answered.   

## 2022-12-12 ENCOUNTER — Inpatient Hospital Stay (HOSPITAL_BASED_OUTPATIENT_CLINIC_OR_DEPARTMENT_OTHER): Payer: BC Managed Care – PPO | Admitting: Oncology

## 2022-12-12 ENCOUNTER — Inpatient Hospital Stay: Payer: BC Managed Care – PPO | Attending: Oncology

## 2022-12-12 ENCOUNTER — Other Ambulatory Visit: Payer: Self-pay

## 2022-12-12 ENCOUNTER — Encounter: Payer: Self-pay | Admitting: Oncology

## 2022-12-12 VITALS — BP 125/56 | HR 76 | Temp 97.6°F | Resp 19 | Wt 160.0 lb

## 2022-12-12 DIAGNOSIS — Z8616 Personal history of COVID-19: Secondary | ICD-10-CM | POA: Diagnosis not present

## 2022-12-12 DIAGNOSIS — N1832 Chronic kidney disease, stage 3b: Secondary | ICD-10-CM

## 2022-12-12 DIAGNOSIS — Z79899 Other long term (current) drug therapy: Secondary | ICD-10-CM | POA: Diagnosis not present

## 2022-12-12 DIAGNOSIS — Z885 Allergy status to narcotic agent status: Secondary | ICD-10-CM | POA: Diagnosis not present

## 2022-12-12 DIAGNOSIS — R5383 Other fatigue: Secondary | ICD-10-CM

## 2022-12-12 DIAGNOSIS — E1122 Type 2 diabetes mellitus with diabetic chronic kidney disease: Secondary | ICD-10-CM | POA: Diagnosis not present

## 2022-12-12 DIAGNOSIS — D7589 Other specified diseases of blood and blood-forming organs: Secondary | ICD-10-CM

## 2022-12-12 DIAGNOSIS — Z888 Allergy status to other drugs, medicaments and biological substances status: Secondary | ICD-10-CM | POA: Insufficient documentation

## 2022-12-12 DIAGNOSIS — E538 Deficiency of other specified B group vitamins: Secondary | ICD-10-CM

## 2022-12-12 DIAGNOSIS — D472 Monoclonal gammopathy: Secondary | ICD-10-CM | POA: Insufficient documentation

## 2022-12-12 DIAGNOSIS — G894 Chronic pain syndrome: Secondary | ICD-10-CM | POA: Diagnosis not present

## 2022-12-12 DIAGNOSIS — Z91041 Radiographic dye allergy status: Secondary | ICD-10-CM | POA: Diagnosis not present

## 2022-12-12 DIAGNOSIS — Z7901 Long term (current) use of anticoagulants: Secondary | ICD-10-CM | POA: Insufficient documentation

## 2022-12-12 DIAGNOSIS — Z85828 Personal history of other malignant neoplasm of skin: Secondary | ICD-10-CM | POA: Diagnosis not present

## 2022-12-12 DIAGNOSIS — E611 Iron deficiency: Secondary | ICD-10-CM | POA: Diagnosis not present

## 2022-12-12 DIAGNOSIS — R531 Weakness: Secondary | ICD-10-CM | POA: Insufficient documentation

## 2022-12-12 DIAGNOSIS — R0602 Shortness of breath: Secondary | ICD-10-CM | POA: Diagnosis not present

## 2022-12-12 DIAGNOSIS — Z87891 Personal history of nicotine dependence: Secondary | ICD-10-CM | POA: Insufficient documentation

## 2022-12-12 DIAGNOSIS — K5520 Angiodysplasia of colon without hemorrhage: Secondary | ICD-10-CM | POA: Diagnosis not present

## 2022-12-12 DIAGNOSIS — D631 Anemia in chronic kidney disease: Secondary | ICD-10-CM | POA: Insufficient documentation

## 2022-12-12 DIAGNOSIS — O280 Abnormal hematological finding on antenatal screening of mother: Secondary | ICD-10-CM

## 2022-12-12 LAB — TSH: TSH: 3.936 u[IU]/mL (ref 0.350–4.500)

## 2022-12-12 LAB — COMPREHENSIVE METABOLIC PANEL
ALT: 13 U/L (ref 0–44)
AST: 17 U/L (ref 15–41)
Albumin: 3.8 g/dL (ref 3.5–5.0)
Alkaline Phosphatase: 46 U/L (ref 38–126)
Anion gap: 11 (ref 5–15)
BUN: 26 mg/dL — ABNORMAL HIGH (ref 8–23)
CO2: 23 mmol/L (ref 22–32)
Calcium: 8.9 mg/dL (ref 8.9–10.3)
Chloride: 104 mmol/L (ref 98–111)
Creatinine, Ser: 1.83 mg/dL — ABNORMAL HIGH (ref 0.61–1.24)
GFR, Estimated: 37 mL/min — ABNORMAL LOW (ref >60)
Glucose, Bld: 95 mg/dL (ref 70–99)
Potassium: 3.7 mmol/L (ref 3.5–5.1)
Sodium: 138 mmol/L (ref 135–145)
Total Bilirubin: 0.5 mg/dL (ref 0.3–1.2)
Total Protein: 6.7 g/dL (ref 6.5–8.1)

## 2022-12-12 LAB — IRON AND TIBC
Iron: 67 ug/dL (ref 45–182)
Saturation Ratios: 24 % (ref 17.9–39.5)
TIBC: 276 ug/dL (ref 250–450)
UIBC: 209 ug/dL

## 2022-12-12 LAB — RETIC PANEL
Immature Retic Fract: 10.7 % (ref 2.3–15.9)
RBC.: 3.41 MIL/uL — ABNORMAL LOW (ref 4.22–5.81)
Retic Count, Absolute: 76.4 10*3/uL (ref 19.0–186.0)
Retic Ct Pct: 2.2 % (ref 0.4–3.1)
Reticulocyte Hemoglobin: 35 pg (ref >27.9)

## 2022-12-12 LAB — CBC WITH DIFFERENTIAL/PLATELET
Abs Immature Granulocytes: 0.02 10*3/uL (ref 0.00–0.07)
Basophils Absolute: 0 10*3/uL (ref 0.0–0.1)
Basophils Relative: 1 %
Eosinophils Absolute: 0.4 10*3/uL (ref 0.0–0.5)
Eosinophils Relative: 6 %
HCT: 35.4 % — ABNORMAL LOW (ref 39.0–52.0)
Hemoglobin: 11.6 g/dL — ABNORMAL LOW (ref 13.0–17.0)
Immature Granulocytes: 0 %
Lymphocytes Relative: 34 %
Lymphs Abs: 2.1 10*3/uL (ref 0.7–4.0)
MCH: 34 pg (ref 26.0–34.0)
MCHC: 32.8 g/dL (ref 30.0–36.0)
MCV: 103.8 fL — ABNORMAL HIGH (ref 80.0–100.0)
Monocytes Absolute: 0.7 10*3/uL (ref 0.1–1.0)
Monocytes Relative: 12 %
Neutro Abs: 2.9 10*3/uL (ref 1.7–7.7)
Neutrophils Relative %: 47 %
Platelets: 237 10*3/uL (ref 150–400)
RBC: 3.41 MIL/uL — ABNORMAL LOW (ref 4.22–5.81)
RDW: 12.8 % (ref 11.5–15.5)
WBC: 6.1 10*3/uL (ref 4.0–10.5)
nRBC: 0 % (ref 0.0–0.2)

## 2022-12-12 LAB — FERRITIN: Ferritin: 199 ng/mL (ref 24–336)

## 2022-12-12 LAB — VITAMIN B12: Vitamin B-12: 721 pg/mL (ref 180–914)

## 2022-12-12 NOTE — Progress Notes (Signed)
Hematology/Oncology Progress note [phone] [phone]       CHIEF COMPLAINTS/REASON FOR VISIT:  Follow up for anemia  ASSESSMENT & PLAN:   Anemia of chronic renal failure, stage 3b (HCC) #Iron deficiency anemia, anemia secondary to chronic kidney disease. Labs reviewed and discussed with patient.   Lab Results  Component Value Date   HGB 11.6 (L) 12/12/2022   TIBC 276 12/12/2022   IRONPCTSAT 24 12/12/2022   FERRITIN 199 12/12/2022     Hemoglobin is normal, iron panel is stablem ferritin close to goal.no need for IV venofer Continue monitor. Continue oral iron supplementation   Low serum vitamin B12 B12 is pending.  Recommend patient to continue B12 supplementation, he is currently on oral otc B12  MGUS (monoclonal gammopathy of unknown significance) I discussed with patient about the diagnosis of IgG kappa MGUS which is an asymptomatic condition which has a small risk of progression to smoldering multiple myeloma and to symptomatic multiple myeloma. Less frequently, these patients progress to AL amyloidosis, light chain deposition disease, or another lymphoproliferative disorder. For now I recommend observation. Check SPEP and light chain ratio  at next visit.    Fatigue TSH was added, and was WNL.  Fatigue is out of proportion to his hemoglobin level.    Macrocytosis B12 has improved, within normal limites.  Macrocytosis persist, he may have underlying bone marrow disorders.  Discussed recommendation of Bone marrow biopsy vs observation.   Orders Placed This Encounter  Procedures   TSH    Standing Status:   Future    Number of Occurrences:   1    Standing Expiration Date:   12/12/2023   Follow up in 6 months.  All questions were answered. The patient knows to call the clinic with any problems, questions or concerns.  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  Hospitalized from 05/10/21 - 05/13/21 due to dizziness/lightlessness, acute drop of hemoglobin to 5.7.  He was transfused multiple units of PRBCs during his hospitalization, and IV iron.  He underwent EGD- which is not remarkable.  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 His Xarelto was held during admission and resumed.  He continues to feel week and was referred to hematology for further evaluation of anemia.  Accompanied by wife. Appetite is good.    INTERVAL HISTORY Riley Martin is a 77 y.o. male who has above history reviewed by me today presents for follow up visit for anemia.  He has had IV venofer treatments. Tolerated well He feels very tired. Stable weight.   Review of Systems  Constitutional:  Positive for fatigue. Negative for appetite change, chills, fever and unexpected weight change.  HENT:   Negative for hearing loss and voice change.   Eyes:  Negative for eye problems and icterus.  Respiratory:  Positive for shortness of breath. Negative for chest tightness and cough.   Cardiovascular:  Negative for chest pain and leg swelling.  Gastrointestinal:  Negative for abdominal distention and abdominal pain.  Endocrine: Negative for hot flashes.  Genitourinary:  Negative for difficulty urinating, dysuria and frequency.   Musculoskeletal:  Negative for arthralgias.  Skin:  Negative for itching and rash.  Neurological:  Negative for light-headedness and numbness.  Hematological:  Negative  for adenopathy. Does not bruise/bleed easily.  Psychiatric/Behavioral:  Negative for confusion.     MEDICAL HISTORY:  Past Medical History:  Diagnosis Date   Arthritis    BPH (benign prostatic hyperplasia)    Cancer (HCC)    skin cancer   Chronic pain    COPD (chronic  obstructive pulmonary disease) (HCC)    COVID    GERD (gastroesophageal reflux disease)    Headache    History of insomnia    IBS (irritable bowel syndrome)    Iron deficiency anemia due to chronic blood loss 07/12/2021   Orthostatic dizziness    PONV (postoperative nausea and vomiting)    Spinal stenosis    Torn rotator cuff    left    SURGICAL HISTORY: Past Surgical History:  Procedure Laterality Date   COLONOSCOPY     COLONOSCOPY WITH PROPOFOL N/A 05/13/2021   Procedure: COLONOSCOPY WITH PROPOFOL;  Surgeon: Wyline Mood, MD;  Location: [facility] ENDOSCOPY;  Service: Gastroenterology;  Laterality: N/A;   ESOPHAGOGASTRODUODENOSCOPY N/A 03/14/2021   Procedure: ESOPHAGOGASTRODUODENOSCOPY (EGD);  Surgeon: Jaynie Collins, DO;  Location: [facility] ENDOSCOPY;  Service: Gastroenterology;  Laterality: N/A;   ESOPHAGOGASTRODUODENOSCOPY N/A 05/13/2021   Procedure: ESOPHAGOGASTRODUODENOSCOPY (EGD);  Surgeon: Wyline Mood, MD;  Location: [facility] ENDOSCOPY;  Service: Gastroenterology;  Laterality: N/A;   EXCISION CHONCHA BULLOSA Bilateral 06/02/2015   Procedure: BILATERAL CHONCHA BULLOSA;  Surgeon: Bud Face, MD;  Location: [facility];  Service: ENT;  Laterality: Bilateral;   HERNIA REPAIR     MAXILLARY ANTROSTOMY Bilateral 06/02/2015   Procedure: MAXILLARY ANTROSTOMY;  Surgeon: Bud Face, MD;  Location: [facility];  Service: ENT;  Laterality: Bilateral;   NASAL POLYP SURGERY     NECK SURGERY  05/08/2006   no limitations   UPPER GASTROINTESTINAL ENDOSCOPY      SOCIAL HISTORY: Social History   Socioeconomic History   Marital status: Married    Spouse name: Not on file   Number of children: Not on file   Years of education: Not on file   Highest education level: Not on file  Occupational History   Not on file  Tobacco Use   Smoking status: Former    Current packs/day: 0.00    Average packs/day: 1 pack/day for 57.0 years (57.0 ttl pk-yrs)    Types: Cigarettes     Start date: 64    Quit date: 2020    Years since quitting: 4.6   Smokeless tobacco: Never  Vaping Use   Vaping status: Never Used  Substance and Sexual Activity   Alcohol use: No   Drug use: No   Sexual activity: Not on file  Other Topics Concern   Not on file  Social History Narrative   Not on file   Social Determinants of Health   Financial Resource Strain: Not on file  Food Insecurity: Not on file  Transportation Needs: Not on file  Physical Activity: Not on file  Stress: Not on file  Social Connections: Not on file  Intimate Partner Violence: Not on file    FAMILY HISTORY: History reviewed. No pertinent family history.  ALLERGIES:  is allergic to iodinated contrast media, lisinopril, tramadol, and pregabalin.  MEDICATIONS:  Current Outpatient Medications  Medication Sig Dispense Refill   albuterol (PROVENTIL) (2.5 MG/3ML) 0.083% nebulizer solution Take 3 mLs by nebulization every 6 (six) hours as needed for wheezing or shortness of breath. 75 mL 0   amiodarone (PACERONE) 200 MG tablet Take 1 tablet (200 mg total) by mouth 2 (  two) times daily. 60 tablet 1   apixaban (ELIQUIS) 2.5 MG TABS tablet Take by mouth.     blood glucose meter kit and supplies KIT Dispense based on patient and insurance preference. Use up to four times daily as directed. 1 each 0   ferrous sulfate 325 (65 FE) MG tablet Take 1 tablet (325 mg total) by mouth 2 (two) times daily with a meal. 60 tablet 11   furosemide (LASIX) 20 MG tablet Take 1 tablet (20 mg total) by mouth 2 (two) times daily. Resume it after checking kidney function and after discussing with primary care doctor 30 tablet    metoprolol tartrate (LOPRESSOR) 25 MG tablet Take 25 mg by mouth daily.     Multiple Vitamin (MULTIVITAMIN WITH MINERALS) TABS tablet Take 1 tablet by mouth daily.     oxyCODONE-acetaminophen (PERCOCET) 10-325 MG tablet Take 1 tablet by mouth 5 (five) times daily as needed.     OXYCONTIN 10 MG 12 hr tablet Take  10 mg by mouth 3 (three) times daily.     sodium chloride (OCEAN) 0.65 % SOLN nasal spray Place 1 spray into both nostrils as needed for congestion. 30 mL 1   tamsulosin (FLOMAX) 0.4 MG CAPS capsule Take 0.4 mg by mouth daily.     VENTOLIN HFA 108 (90 Base) MCG/ACT inhaler Inhale 2 puffs into the lungs every 6 (six) hours as needed.     budesonide (PULMICORT) 1 MG/2ML nebulizer solution Take 1 mg by nebulization daily. (Patient not taking: Reported on 05/02/2022)     Cyanocobalamin (B-12) 2500 MCG SUBL Place 1 tablet under the tongue daily. (Patient not taking: Reported on 05/02/2022) 90 tablet 1   fluticasone (FLONASE) 50 MCG/ACT nasal spray SPRAY 2 SPRAYS INTO EACH NOSTRIL EVERY DAY (Patient not taking: Reported on 05/02/2022) 16 g 0   hydrocortisone 2.5 % cream Apply topically. (Patient not taking: Reported on 05/02/2022)     Insulin Pen Needle (PEN NEEDLES) 31G X 6 MM MISC 1 each by Does not apply route 3 (three) times daily with meals. (Patient not taking: Reported on 07/12/2021) 100 each 0   ipratropium (ATROVENT) 0.02 % nebulizer solution Take 0.5 mg by nebulization 2 (two) times daily. (Patient not taking: Reported on 05/02/2022)     mirtazapine (REMERON) 15 MG tablet Take 15 mg by mouth at bedtime. (Patient not taking: Reported on 05/02/2022)     mometasone-formoterol (DULERA) 200-5 MCG/ACT AERO Inhale 2 puffs into the lungs 2 (two) times daily. (Patient not taking: Reported on 06/12/2022) 1 each 1   naloxone (NARCAN) nasal spray 4 mg/0.1 mL Place 0.4 mg into the nose once. CALL 911. SPR CONTENTS OF ONE SPRAYER (0.1ML) INTO ONE NOSTRIL. REPEAT IN 2-3 MIN IF SYMPTOMS OF OPIOID EMERGENCY PERSIST, ALTERNATE NOSTRILS 11/22/2021 (Patient not taking: Reported on 06/12/2022)     Nutritional Supplements (FEEDING SUPPLEMENT, NEPRO CARB STEADY,) LIQD Take 237 mLs by mouth 3 (three) times daily between meals. (Patient not taking: Reported on 06/12/2022)  0   ondansetron (ZOFRAN-ODT) 4 MG disintegrating tablet  Take 4 mg by mouth every 8 (eight) hours as needed for nausea or vomiting. (Patient not taking: Reported on 06/12/2022)     spironolactone (ALDACTONE) 25 MG tablet Take 25 mg by mouth daily. (Patient not taking: Reported on 12/12/2022)     No current facility-administered medications for this visit.     PHYSICAL EXAMINATION: ECOG PERFORMANCE STATUS: 2 - Symptomatic, <50% confined to bed Vitals:   12/12/22 1411  BP: (!) 125/56  Pulse: 76  Resp: 19  Temp: 97.6 F (36.4 C)  SpO2: 97%   Filed Weights   12/12/22 1411  Weight: 160 lb (72.6 kg)    Physical Exam Constitutional:      General: He is not in acute distress. HENT:     Head: Normocephalic and atraumatic.  Eyes:     General: No scleral icterus. Cardiovascular:     Rate and Rhythm: Normal rate and regular rhythm.     Heart sounds: Normal heart sounds.  Pulmonary:     Effort: Pulmonary effort is normal. No respiratory distress.     Breath sounds: Wheezing present.     Comments: Severely decreased breath sound.  Abdominal:     General: Bowel sounds are normal. There is no distension.     Palpations: Abdomen is soft.  Musculoskeletal:        General: No deformity. Normal range of motion.     Cervical back: Normal range of motion and neck supple.  Skin:    General: Skin is warm and dry.     Coloration: Skin is pale.     Findings: No erythema or rash.  Neurological:     Mental Status: He is alert and oriented to person, place, and time. Mental status is at baseline.     Cranial Nerves: No cranial nerve deficit.     Coordination: Coordination normal.  Psychiatric:        Mood and Affect: Mood normal.     LABORATORY DATA:  I have reviewed the data as listed    Latest Ref Rng & Units 12/12/2022    1:57 PM 10/08/2022    2:33 PM 06/12/2022    3:25 PM  CBC  WBC 4.0 - 10.5 K/uL 6.1  9.9  9.2   Hemoglobin 13.0 - 17.0 g/dL 21.3  08.6  57.8   Hematocrit 39.0 - 52.0 % 35.4  42.2  37.8   Platelets 150 - 400 K/uL 237  300  222        Latest Ref Rng & Units 12/12/2022    1:57 PM 10/08/2022    2:33 PM 05/03/2022   12:56 AM  CMP  Glucose 70 - 99 mg/dL 95  469  96   BUN 8 - 23 mg/dL 26  43  25   Creatinine 0.61 - 1.24 mg/dL 6.29  5.28  4.13   Sodium 135 - 145 mmol/L 138  138  135   Potassium 3.5 - 5.1 mmol/L 3.7  4.5  3.8   Chloride 98 - 111 mmol/L 104  101  104   CO2 22 - 32 mmol/L 23  28  26    Calcium 8.9 - 10.3 mg/dL 8.9  8.7  7.7   Total Protein 6.5 - 8.1 g/dL 6.7     Total Bilirubin 0.3 - 1.2 mg/dL 0.5     Alkaline Phos 38 - 126 U/L 46     AST 15 - 41 U/L 17     ALT 0 - 44 U/L 13        Iron/TIBC/Ferritin/ %Sat    Component Value Date/Time   IRON 67 12/12/2022 1357   TIBC 276 12/12/2022 1357   FERRITIN 199 12/12/2022 1357   IRONPCTSAT 24 12/12/2022 1357      RADIOGRAPHIC STUDIES: I have personally reviewed the radiological images as listed and agreed with the findings in the report. No results found.

## 2022-12-12 NOTE — Assessment & Plan Note (Addendum)
I discussed with patient about the diagnosis of IgG kappa MGUS which is an asymptomatic condition which has a small risk of progression to smoldering multiple myeloma and to symptomatic multiple myeloma. Less frequently, these patients progress to AL amyloidosis, light chain deposition disease, or another lymphoproliferative disorder. For now I recommend observation. Check SPEP and light chain ratio  at next visit.

## 2022-12-12 NOTE — Assessment & Plan Note (Addendum)
B12 is pending.  Recommend patient to continue B12 supplementation, he is currently on oral otc B12

## 2022-12-12 NOTE — Assessment & Plan Note (Addendum)
B12 has improved, within normal limites.  Macrocytosis persist, he may have underlying bone marrow disorders.  Discussed recommendation of Bone marrow biopsy vs observation.

## 2022-12-12 NOTE — Assessment & Plan Note (Addendum)
#  Iron deficiency anemia, anemia secondary to chronic kidney disease. Labs reviewed and discussed with patient.   Lab Results  Component Value Date   HGB 11.6 (L) 12/12/2022   TIBC 276 12/12/2022   IRONPCTSAT 24 12/12/2022   FERRITIN 199 12/12/2022     Hemoglobin is normal, iron panel is stablem ferritin close to goal.no need for IV venofer Continue monitor. Continue oral iron supplementation

## 2022-12-12 NOTE — Assessment & Plan Note (Signed)
TSH was added, and was WNL.  Fatigue is out of proportion to his hemoglobin level.

## 2022-12-23 ENCOUNTER — Encounter: Payer: Self-pay | Admitting: Oncology

## 2022-12-25 ENCOUNTER — Telehealth: Payer: Self-pay

## 2022-12-25 DIAGNOSIS — N1832 Chronic kidney disease, stage 3b: Secondary | ICD-10-CM

## 2022-12-25 DIAGNOSIS — D472 Monoclonal gammopathy: Secondary | ICD-10-CM

## 2022-12-25 NOTE — Telephone Encounter (Signed)
-----   Message from Rickard Patience sent at 12/23/2022 10:29 AM EDT ----- Please let patient know that his B12 level has improved, however the enlarged RBC -macrocytosis persists. Underlying bone marrow disorder is a possibility.  One option is to proceed with bone marrow biopsy to further clarify.  Or given that his blood counts are overall stable, we could observe counts and proceed with BM biopsy if his counts progressively drop.  If BM biopsy, please arrange him to see me 2 weeks after biopsy.  If he elects to be observed, please arrange him to see me 6 months lab prior to MD  Please order cbc cmp, retic panel, b12 smear. Thanks.

## 2022-12-25 NOTE — Telephone Encounter (Signed)
Called pt no answer. Called and spoke to pt's wife Jasmine December regarding MD recommendation. She said pt had mentioned that if labs were stable to continue observation, but she will discuss with him and if he decides to proceed with biopsy they will contact us to let us know. She wanted to go ahead and schedule 6 month follow up.   Please schedule labs (cbc,cmp,retic panel, B12, tech smer) on 06/22/2023 @ 2:30pm  MD on 06/27/2023 @ 10:45am  Pt's wife aware of appt details.

## 2022-12-26 ENCOUNTER — Encounter: Payer: Self-pay | Admitting: Oncology

## 2022-12-31 ENCOUNTER — Emergency Department
Admission: EM | Admit: 2022-12-31 | Discharge: 2022-12-31 | Disposition: A | Discharge status: Home / Self Care | Payer: BC Managed Care – PPO | Attending: Emergency Medicine | Admitting: Emergency Medicine

## 2022-12-31 ENCOUNTER — Encounter: Payer: Self-pay | Admitting: Emergency Medicine

## 2022-12-31 ENCOUNTER — Other Ambulatory Visit: Payer: Self-pay

## 2022-12-31 DIAGNOSIS — J449 Chronic obstructive pulmonary disease, unspecified: Secondary | ICD-10-CM | POA: Diagnosis not present

## 2022-12-31 DIAGNOSIS — R059 Cough, unspecified: Secondary | ICD-10-CM | POA: Diagnosis present

## 2022-12-31 DIAGNOSIS — U071 COVID-19: Secondary | ICD-10-CM | POA: Diagnosis not present

## 2022-12-31 DIAGNOSIS — N1832 Chronic kidney disease, stage 3b: Secondary | ICD-10-CM | POA: Diagnosis not present

## 2022-12-31 DIAGNOSIS — I129 Hypertensive chronic kidney disease with stage 1 through stage 4 chronic kidney disease, or unspecified chronic kidney disease: Secondary | ICD-10-CM | POA: Diagnosis not present

## 2022-12-31 DIAGNOSIS — E1122 Type 2 diabetes mellitus with diabetic chronic kidney disease: Secondary | ICD-10-CM | POA: Diagnosis not present

## 2022-12-31 LAB — RESP PANEL BY RT-PCR (RSV, FLU A&B, COVID)  RVPGX2
Influenza A by PCR: NEGATIVE
Influenza B by PCR: NEGATIVE
Resp Syncytial Virus by PCR: NEGATIVE
SARS Coronavirus 2 by RT PCR: POSITIVE — AB

## 2022-12-31 MED ORDER — NIRMATRELVIR/RITONAVIR (PAXLOVID)TABLET: 3.0000 | ORAL_TABLET | Freq: Two times a day (BID) | ORAL | 0 refills | Status: AC

## 2022-12-31 NOTE — Discharge Instructions (Addendum)
Your COVID test was positive.  Please read the attached information about quarantine and isolation.  You can take Tylenol as needed for your body aches.  650 mg every 6 hours.  You can take over-the-counter cold medication to help manage your symptoms of cough.  Due to your medical history and multiple comorbidities, I have also sent Paxlovid to your pharmacy.  Please take this medication as prescribed.  Please return to the ED if you have any new or worsening symptoms, like difficulty breathing or fever you cannot get down with Tylenol at home.

## 2022-12-31 NOTE — ED Triage Notes (Signed)
Patient to ED via [facility] from home for covid symptoms- cough and body aches. States family member has covid. Lack of appetite.

## 2022-12-31 NOTE — ED Provider Notes (Signed)
[facility] Provider Note    Event Date/Time   First MD Initiated Contact with Patient 12/31/22 1529     (approximate)   History   Cough   HPI  Riley Martin is a 77 y.o. male  with PMH of  COPD, diabetes, CKD stage 3b, HTN, Afib, who presents for evaluation of URI symptoms.  He endorses cough and bodyaches.  Patient is concerned about having COVID as he has a family member with COVID.       Physical Exam   Triage Vital Signs: ED Triage Vitals  Encounter Vitals Group     BP 12/31/22 1525 125/61     Systolic BP Percentile --      Diastolic BP Percentile --      Pulse Rate 12/31/22 1525 (!) 109     Resp 12/31/22 1525 18     Temp 12/31/22 1525 98.3 F (36.8 C)     Temp Source 12/31/22 1525 Oral     SpO2 12/31/22 1525 97 %     Weight 12/31/22 1526 160 lb (72.6 kg)     Height 12/31/22 1526 6' (1.829 m)     Head Circumference --      Peak Flow --      Pain Score 12/31/22 1525 8     Pain Loc --      Pain Education --      Exclude from Growth Chart --     Most recent vital signs: Vitals:   12/31/22 1525  BP: 125/61  Pulse: (!) 109  Resp: 18  Temp: 98.3 F (36.8 C)  SpO2: 97%    General: Awake, no distress.  CV:  Good peripheral perfusion. RRR. Resp:  Normal effort. Wheezing bilaterally.  Abd:  No distention.    ED Results / Procedures / Treatments   Labs (all labs ordered are listed, but only abnormal results are displayed) Labs Reviewed  RESP PANEL BY RT-PCR (RSV, FLU A&B, COVID)  [id number] - Abnormal; Notable for the following components:      Result Value   SARS Coronavirus 2 by RT PCR POSITIVE (*)    All other components within normal limits      PROCEDURES:  Critical Care performed: No  Procedures   MEDICATIONS ORDERED IN ED: Medications - No data to display   IMPRESSION / MDM / ASSESSMENT AND PLAN / ED COURSE  I reviewed the triage vital signs and the nursing notes.                               Differential diagnosis includes, but is not limited to, COVID, flu, RSV, other viral URI.  Patient's presentation is most consistent with acute complicated illness / injury requiring diagnostic workup.  Patient's COVID test was positive.  Given that patient is afebrile and vital signs are stable other than being mildly tachycardic, I feel patient is okay for outpatient management.  Do not feel that a chest x-ray would change my management at this point.  I spoke with my supervising physician, Dr. Cyril Loosen, who recommended Paxlovid given patient's extensive medical history.  I reviewed patient's most recent labs (12/12/22) which showed his GFR was 37, Dr. Cyril Loosen said that this was within the appropriate range for Paxlovid.  I also advised patient to take OTC cold medications and Tylenol as needed for his body aches. He may also use his inhaler to help with his wheezing as  needed. Patient voiced understanding, all questions were answered and he was stable at discharge.      FINAL CLINICAL IMPRESSION(S) / ED DIAGNOSES   Final diagnoses:  COVID     Rx / DC Orders   ED Discharge Orders          Ordered    nirmatrelvir/ritonavir (PAXLOVID) 20 x 150 MG & 10 x 100MG  TABS  2 times daily        12/31/22 1638             Note:  This document was prepared using Dragon voice recognition software and may include unintentional dictation errors.   Cameron Ali, PA-C 12/31/22 1645    Jene Every, MD 12/31/22 1700

## 2023-01-04 IMAGING — CT CT CHEST W/O CM
1 series · 14 of 34 positions shown, 18 images · non-contrast
Comparison: 06/21/2020.

CLINICAL DATA: Shortness of breath, chronic cough, history of
Aspergillus infection.

EXAM:
CT CHEST WITHOUT CONTRAST
TECHNIQUE: Multidetector CT imaging of the chest was performed following the
standard protocol without IV contrast.

[Series 2: thorax · axial · 0.80mm/px · z∈[-624,-336]mm · 14 of 170 slices shown, 18 images]
[im 13/170  mediastinal]
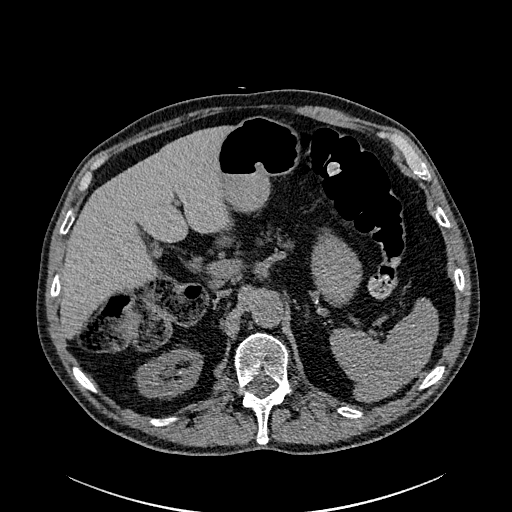
[im 13/170  lung]
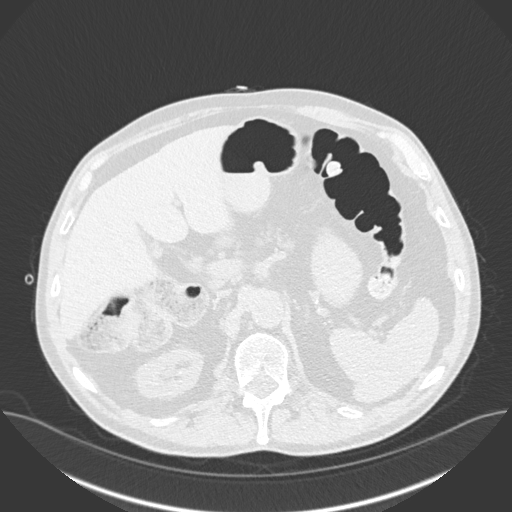
[im 26/170  lung]
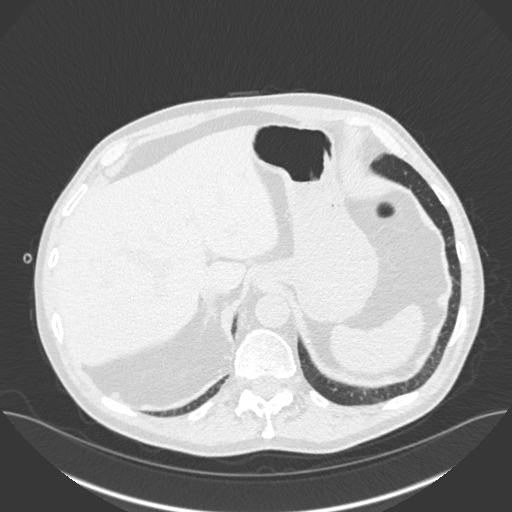
[im 34/170  lung]
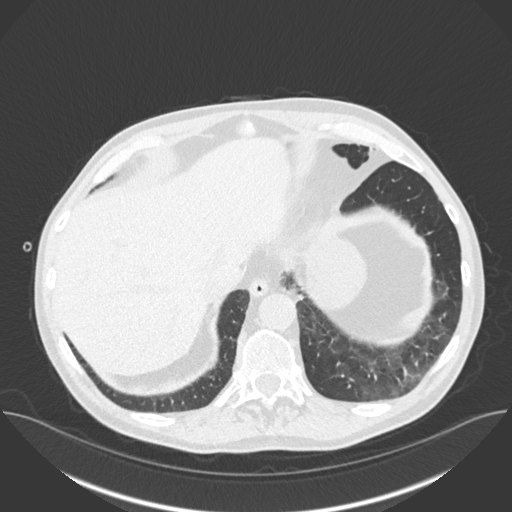
[im 51/170  lung]
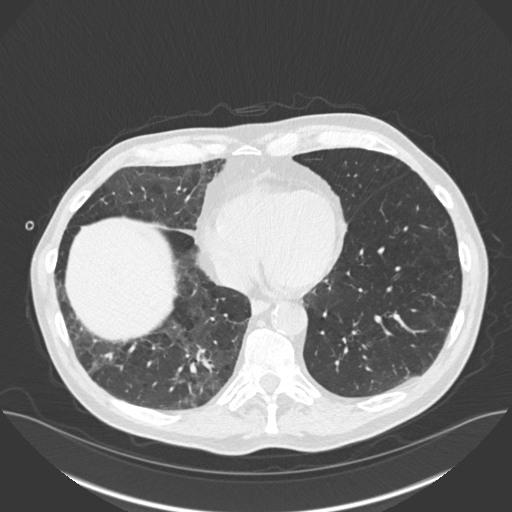
[im 63/170  mediastinal]
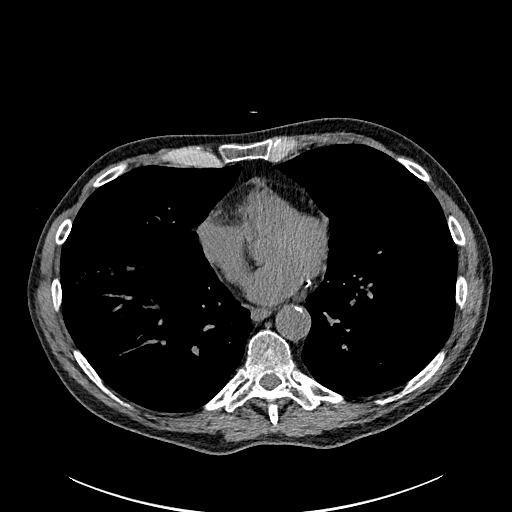
[im 63/170  lung]
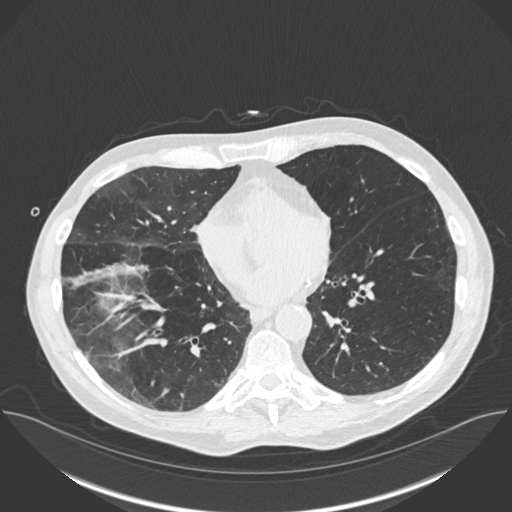
[im 69/170  lung]
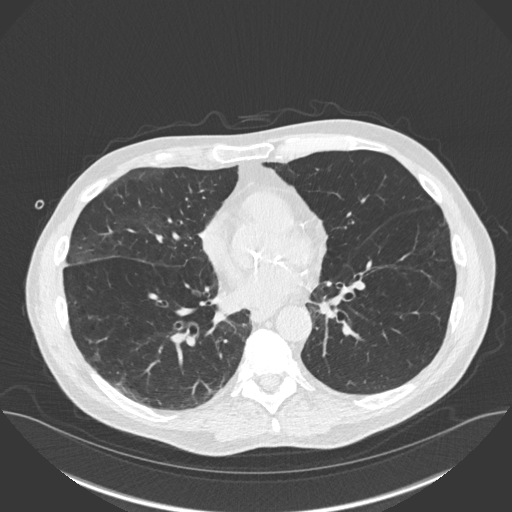
[im 81/170  lung]
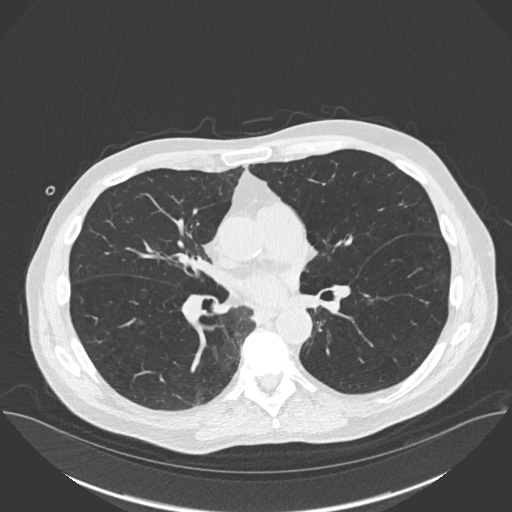
[im 90/170  lung]
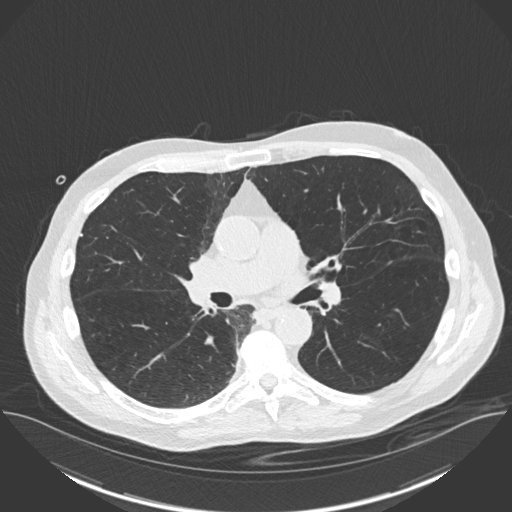
[im 101/170  mediastinal]
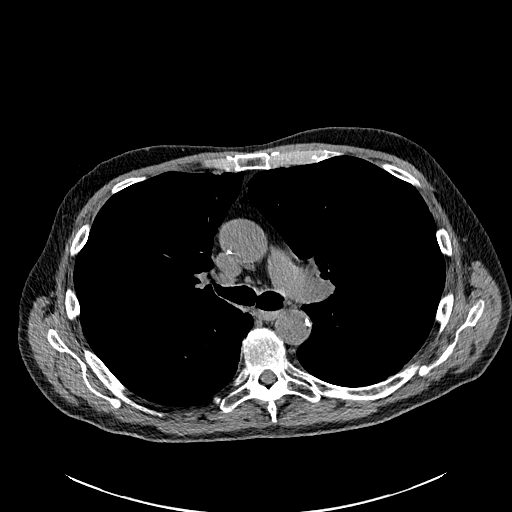
[im 101/170  lung]
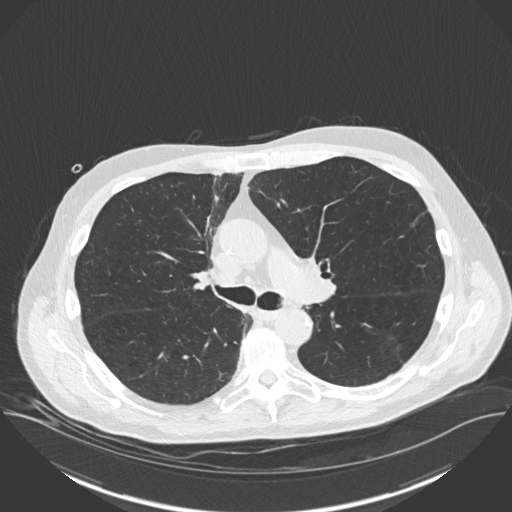
[im 107/170  lung]
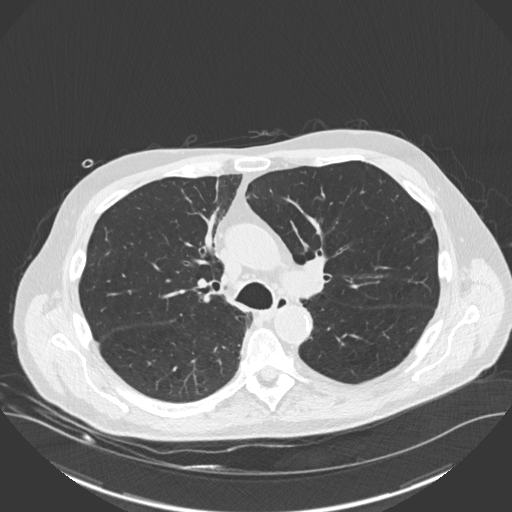
[im 126/170  lung]
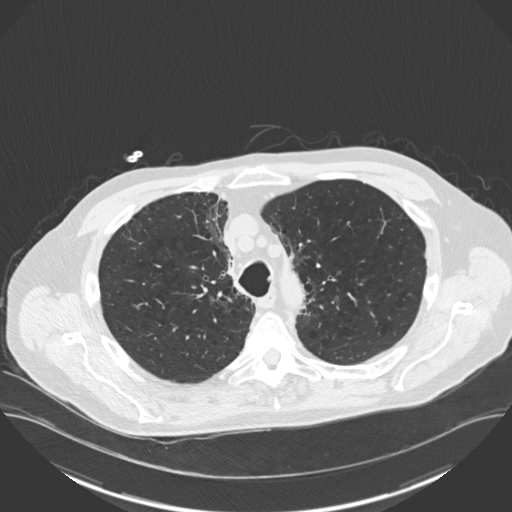
[im 136/170  lung]
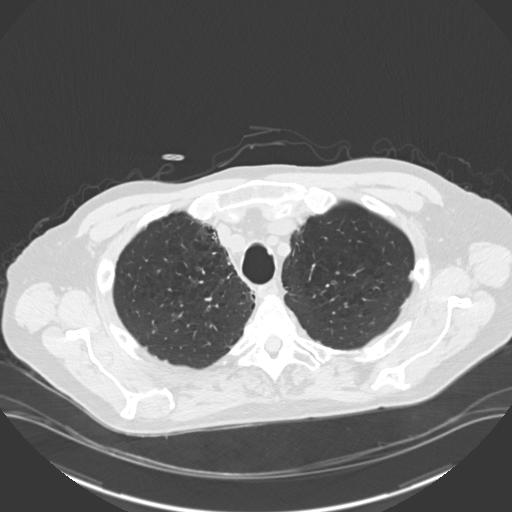
[im 144/170  mediastinal]
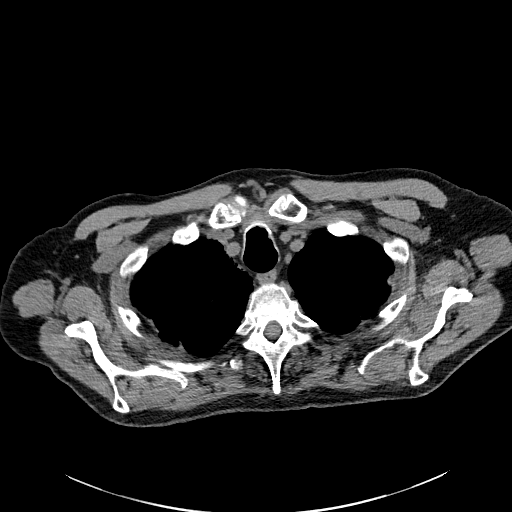
[im 144/170  lung]
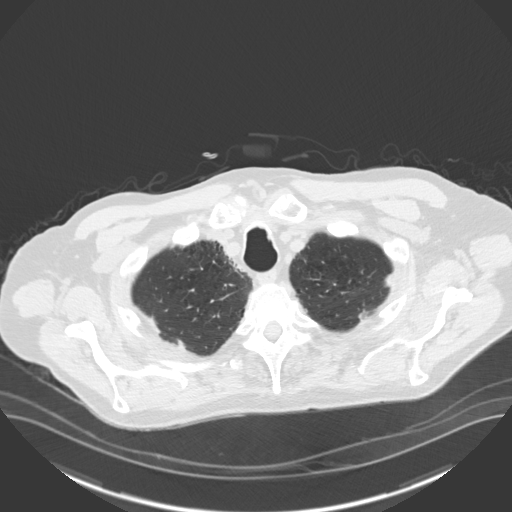
[im 157/170  lung]
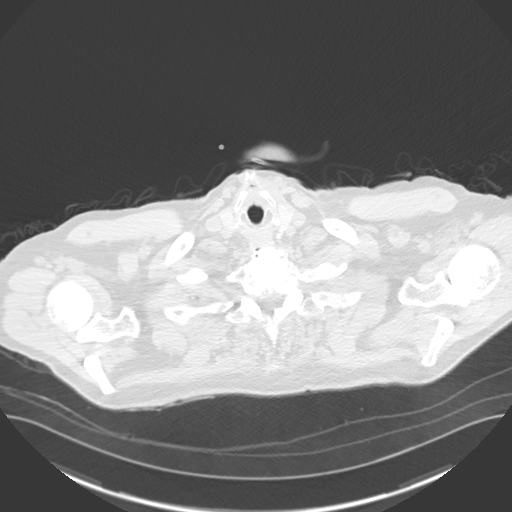

[14 of 34 positions shown; findings below may reference images not displayed]

FINDINGS: Cardiovascular: Atherosclerotic calcification of the aorta, aortic
valve and coronary arteries. Pulmonic trunk is enlarged. Heart size
normal. No pericardial effusion.

Mediastinum/Nodes: No pathologically enlarged mediastinal or
axillary lymph nodes. Hilar regions are difficult to definitively
evaluate without IV contrast. Esophagus is grossly unremarkable.

Lungs/Pleura: Biapical pleuroparenchymal scarring. Centrilobular and
paraseptal emphysema. New basilar predominant patchy ground-glass,
bronchiectasis and mild architectural distortion. Calcified
granuloma in the left upper lobe. Linear scarring in the lingula. 8
mm nodule in the superior segment left lower lobe (3/76), new from
06/21/2020. No pleural fluid. Debris is seen in the airway.

Upper Abdomen: Visualized portions of the liver, gallbladder and
adrenal glands are unremarkable. Punctate stone in the right kidney.
Visualized portions of the kidneys, spleen, pancreas, stomach and
bowel are otherwise unremarkable. No upper abdominal adenopathy.

Musculoskeletal: Degenerative changes in the spine. No worrisome
lytic or sclerotic lesions.
IMPRESSION: 1. New left lower lobe nodule may be infectious in etiology, given
the patient's history of Aspergillus infection. Malignancy can also
have this appearance. Follow-up CT chest in 3 months could be
performed, as clinically indicated. If a more aggressive approach is
desired, PET could be considered but lesion is borderline in size
for PET resolution. These results will be called to the ordering
clinician or representative by the Radiologist Assistant, and
communication documented in the PACS or [REDACTED].
2. New basilar predominant patchy ground-glass, bronchiectasis and
mild architectural distortion, findings which may represent the
sequelae of 556O6-9G pneumonia.
3. Punctate right renal stone.
4. Aortic atherosclerosis (L5IJY-JZY.Y). Coronary artery
calcification.
5. Enlarged pulmonic trunk, indicative of pulmonary arterial
hypertension.
6.  Emphysema (L5IJY-JWD.G).

## 2023-03-12 ENCOUNTER — Encounter: Payer: Self-pay | Admitting: *Deleted

## 2023-03-12 ENCOUNTER — Emergency Department: Payer: BC Managed Care – PPO

## 2023-03-12 ENCOUNTER — Other Ambulatory Visit: Payer: Self-pay

## 2023-03-12 ENCOUNTER — Emergency Department
Admission: EM | Admit: 2023-03-12 | Discharge: 2023-03-12 | Disposition: A | Discharge status: Home / Self Care | Payer: BC Managed Care – PPO | Attending: Emergency Medicine | Admitting: Emergency Medicine

## 2023-03-12 DIAGNOSIS — R197 Diarrhea, unspecified: Secondary | ICD-10-CM | POA: Insufficient documentation

## 2023-03-12 DIAGNOSIS — R1084 Generalized abdominal pain: Secondary | ICD-10-CM | POA: Diagnosis present

## 2023-03-12 DIAGNOSIS — R112 Nausea with vomiting, unspecified: Secondary | ICD-10-CM | POA: Insufficient documentation

## 2023-03-12 DIAGNOSIS — N183 Chronic kidney disease, stage 3 unspecified: Secondary | ICD-10-CM | POA: Insufficient documentation

## 2023-03-12 DIAGNOSIS — I129 Hypertensive chronic kidney disease with stage 1 through stage 4 chronic kidney disease, or unspecified chronic kidney disease: Secondary | ICD-10-CM | POA: Insufficient documentation

## 2023-03-12 DIAGNOSIS — J449 Chronic obstructive pulmonary disease, unspecified: Secondary | ICD-10-CM | POA: Insufficient documentation

## 2023-03-12 LAB — COMPREHENSIVE METABOLIC PANEL
ALT: 14 U/L (ref 0–44)
AST: 14 U/L — ABNORMAL LOW (ref 15–41)
Albumin: 3.5 g/dL (ref 3.5–5.0)
Alkaline Phosphatase: 43 U/L (ref 38–126)
Anion gap: 11 (ref 5–15)
BUN: 27 mg/dL — ABNORMAL HIGH (ref 8–23)
CO2: 21 mmol/L — ABNORMAL LOW (ref 22–32)
Calcium: 8.8 mg/dL — ABNORMAL LOW (ref 8.9–10.3)
Chloride: 106 mmol/L (ref 98–111)
Creatinine, Ser: 2.16 mg/dL — ABNORMAL HIGH (ref 0.61–1.24)
GFR, Estimated: 31 mL/min — ABNORMAL LOW (ref >60)
Glucose, Bld: 120 mg/dL — ABNORMAL HIGH (ref 70–99)
Potassium: 4 mmol/L (ref 3.5–5.1)
Sodium: 138 mmol/L (ref 135–145)
Total Bilirubin: 0.4 mg/dL (ref <1.2)
Total Protein: 6.7 g/dL (ref 6.5–8.1)

## 2023-03-12 LAB — TROPONIN I (HIGH SENSITIVITY)
Troponin I (High Sensitivity): 4 ng/L (ref <18)
Troponin I (High Sensitivity): 5 ng/L (ref <18)

## 2023-03-12 LAB — URINALYSIS, ROUTINE W REFLEX MICROSCOPIC
Bilirubin Urine: NEGATIVE
Glucose, UA: NEGATIVE mg/dL
Hgb urine dipstick: NEGATIVE
Ketones, ur: NEGATIVE mg/dL
Leukocytes,Ua: NEGATIVE
Nitrite: NEGATIVE
Protein, ur: NEGATIVE mg/dL
Specific Gravity, Urine: 1.02 (ref 1.005–1.030)
pH: 5 (ref 5.0–8.0)

## 2023-03-12 LAB — CBC
HCT: 36.5 % — ABNORMAL LOW (ref 39.0–52.0)
Hemoglobin: 12 g/dL — ABNORMAL LOW (ref 13.0–17.0)
MCH: 32.5 pg (ref 26.0–34.0)
MCHC: 32.9 g/dL (ref 30.0–36.0)
MCV: 98.9 fL (ref 80.0–100.0)
Platelets: 255 10*3/uL (ref 150–400)
RBC: 3.69 MIL/uL — ABNORMAL LOW (ref 4.22–5.81)
RDW: 12.7 % (ref 11.5–15.5)
WBC: 7.8 10*3/uL (ref 4.0–10.5)
nRBC: 0 % (ref 0.0–0.2)

## 2023-03-12 LAB — LIPASE, BLOOD: Lipase: 19 U/L (ref 11–51)

## 2023-03-12 LAB — PROTIME-INR
INR: 1.1 (ref 0.8–1.2)
Prothrombin Time: 14.9 s (ref 11.4–15.2)

## 2023-03-12 MED ORDER — METOCLOPRAMIDE HCL 10 MG PO TABS: 10.0000 mg | ORAL_TABLET | Freq: Three times a day (TID) | ORAL | 0 refills | Status: DC

## 2023-03-12 NOTE — ED Triage Notes (Addendum)
Pt to triage via wheelchair.  Pt sent from [facility] for eval of abd pain and vomiting.   Pt reports abd pain for 4 weeks.  Pt states vomiting since last week.  Pt has diarrhea.  Pt on 3 liters oxygen for copd.  Pt alert.

## 2023-03-12 NOTE — Discharge Instructions (Addendum)
Please pick up the medicine from the pharmacy and take as prescribed.  You will take this medication prior to meals for the next 1 to 2 weeks.  Do not take Zofran with this medication.  Please follow-up with your primary care provider for reevaluation within a week and follow-up with the GI team as planned.

## 2023-03-12 NOTE — ED Provider Notes (Signed)
[facility] Provider Note    Event Date/Time   First MD Initiated Contact with Patient 03/12/23 2156     (approximate)   History   Abdominal Pain   HPI Riley Martin is a 77 y.o. male with history of COPD on [facility] OT, CKD stage III, chronic fatigue, HLD, HTN presenting today for abdominal pain.  Patient states for the past 6 to 8 weeks he has had intermittent nausea, vomiting, and diarrhea.  He has had abdominal soreness over this time period as well.  He has been taking Zofran with some relief of symptoms.  Has a GI referral but has not seen them yet.  His provider told him to come in today for worsening abdominal pain to be evaluated with a CT.  Otherwise denies fever, chills, cough, congestion, chest pain, shortness of breath, dysuria, hematuria.  Patient notes that he feels like he gets full quickly when eating meals.  Chart review, reviewed most recent chart notes regarding symptoms     Physical Exam   Triage Vital Signs: ED Triage Vitals  Encounter Vitals Group     BP 03/12/23 1544 (!) 120/52     Systolic BP Percentile --      Diastolic BP Percentile --      Pulse Rate 03/12/23 1544 91     Resp 03/12/23 1544 20     Temp 03/12/23 1544 98.3 F (36.8 C)     Temp Source 03/12/23 1544 Oral     SpO2 03/12/23 1544 93 %     Weight 03/12/23 1539 162 lb (73.5 kg)     Height 03/12/23 1539 6' (1.829 m)     Head Circumference --      Peak Flow --      Pain Score 03/12/23 1539 7     Pain Loc --      Pain Education --      Exclude from Growth Chart --     Most recent vital signs: Vitals:   03/12/23 2005 03/12/23 2230  BP: (!) 118/53 116/88  Pulse: 98 87  Resp: 20 20  Temp: 97.8 F (36.6 C)   SpO2: 97% 100%   Physical Exam: I have reviewed the vital signs and nursing notes. General: Awake, alert, no acute distress.  Nontoxic appearing. Head:  Atraumatic, normocephalic.   ENT:  EOM intact, PERRL. Oral mucosa is pink and moist with no  lesions. Neck: Neck is supple with full range of motion, No meningeal signs. Cardiovascular:  RRR, No murmurs. Peripheral pulses palpable and equal bilaterally. Respiratory:  Symmetrical chest wall expansion.  No rhonchi, rales, or wheezes.  Good air movement throughout.  No use of accessory muscles.   Musculoskeletal:  No cyanosis or edema. Moving extremities with full ROM Abdomen:  Soft, generalized abdominal tenderness, nondistended. Neuro:  GCS 15, moving all four extremities, interacting appropriately. Speech clear. Psych:  Calm, appropriate.   Skin:  Warm, dry, no rash.    ED Results / Procedures / Treatments   Labs (all labs ordered are listed, but only abnormal results are displayed) Labs Reviewed  COMPREHENSIVE METABOLIC PANEL - Abnormal; Notable for the following components:      Result Value   CO2 21 (*)    Glucose, Bld 120 (*)    BUN 27 (*)    Creatinine, Ser 2.16 (*)    Calcium 8.8 (*)    AST 14 (*)    GFR, Estimated 31 (*)    All other components  within normal limits  CBC - Abnormal; Notable for the following components:   RBC 3.69 (*)    Hemoglobin 12.0 (*)    HCT 36.5 (*)    All other components within normal limits  URINALYSIS, ROUTINE W REFLEX MICROSCOPIC - Abnormal; Notable for the following components:   Color, Urine YELLOW (*)    APPearance HAZY (*)    All other components within normal limits  LIPASE, BLOOD  PROTIME-INR  TROPONIN I (HIGH SENSITIVITY)  TROPONIN I (HIGH SENSITIVITY)     EKG My EKG interpretation: Rate of 87, normal sinus rhythm.  Right bundle branch block.  No acute ST elevations or depressions   RADIOLOGY Independently interpreted chest x-ray and CT abdomen/pelvis with no acute pathology   PROCEDURES:  Critical Care performed: No  Procedures   MEDICATIONS ORDERED IN ED: Medications - No data to display   IMPRESSION / MDM / ASSESSMENT AND PLAN / ED COURSE  I reviewed the triage vital signs and the nursing notes.                               Differential diagnosis includes, but is not limited to, cyclic vomiting syndrome, gastroparesis, SBO, constipation, pancreatitis  Patient's presentation is most consistent with acute complicated illness / injury requiring diagnostic workup.  Patient is a 77 year old male presenting today for intermittent nausea, vomiting, and generalized abdominal pain over the past 6 to 8 weeks.  Vital signs are stable on arrival.  Exam only notable for generalized tenderness palpation that is more like soreness.  Laboratory workup was performed which shows no leukocytosis.  CMP with creatinine function similar to baseline.  Patient otherwise tolerating p.o. and does not need IV fluids at this time.  UA unremarkable.  Troponins negative x 2.  Lipase normal.  Chest x-ray unremarkable.  CT abdomen/pelvis was performed which shows no acute intra-abdominal pathology.  No emergent pathology requiring IV antibiotics or surgery at this time.  Concern for either cyclic vomiting syndrome versus potential gastroparesis as patient states he does get full quickly.  He has Zofran at home.  Will trial short course of medicine to potentially help with gastroparesis symptoms and have him follow-up as planned with his GI team.  Patient was given strict return precautions for any worsening symptoms and agreeable with this plan.  The patient is on the cardiac monitor to evaluate for evidence of arrhythmia and/or significant heart rate changes.     FINAL CLINICAL IMPRESSION(S) / ED DIAGNOSES   Final diagnoses:  Nausea vomiting and diarrhea  Generalized abdominal pain     Rx / DC Orders   ED Discharge Orders     None        Note:  This document was prepared using Dragon voice recognition software and may include unintentional dictation errors.   Janith Lima, MD 03/12/23 2241

## 2023-03-17 ENCOUNTER — Other Ambulatory Visit: Payer: Self-pay

## 2023-03-17 ENCOUNTER — Emergency Department
Admission: EM | Admit: 2023-03-17 | Discharge: 2023-03-18 | Disposition: A | Discharge status: Home / Self Care | Payer: BC Managed Care – PPO | Attending: Emergency Medicine | Admitting: Emergency Medicine

## 2023-03-17 ENCOUNTER — Encounter: Payer: Self-pay | Admitting: Emergency Medicine

## 2023-03-17 DIAGNOSIS — J449 Chronic obstructive pulmonary disease, unspecified: Secondary | ICD-10-CM | POA: Diagnosis not present

## 2023-03-17 DIAGNOSIS — R1110 Vomiting, unspecified: Secondary | ICD-10-CM | POA: Insufficient documentation

## 2023-03-17 DIAGNOSIS — G8929 Other chronic pain: Secondary | ICD-10-CM | POA: Diagnosis not present

## 2023-03-17 DIAGNOSIS — R1031 Right lower quadrant pain: Secondary | ICD-10-CM | POA: Diagnosis present

## 2023-03-17 DIAGNOSIS — K219 Gastro-esophageal reflux disease without esophagitis: Secondary | ICD-10-CM | POA: Insufficient documentation

## 2023-03-17 DIAGNOSIS — R1032 Left lower quadrant pain: Secondary | ICD-10-CM | POA: Diagnosis not present

## 2023-03-17 LAB — COMPREHENSIVE METABOLIC PANEL
ALT: 13 U/L (ref 0–44)
AST: 15 U/L (ref 15–41)
Albumin: 3.4 g/dL — ABNORMAL LOW (ref 3.5–5.0)
Alkaline Phosphatase: 49 U/L (ref 38–126)
Anion gap: 12 (ref 5–15)
BUN: 29 mg/dL — ABNORMAL HIGH (ref 8–23)
CO2: 22 mmol/L (ref 22–32)
Calcium: 8.6 mg/dL — ABNORMAL LOW (ref 8.9–10.3)
Chloride: 103 mmol/L (ref 98–111)
Creatinine, Ser: 2.14 mg/dL — ABNORMAL HIGH (ref 0.61–1.24)
GFR, Estimated: 31 mL/min — ABNORMAL LOW (ref >60)
Glucose, Bld: 87 mg/dL (ref 70–99)
Potassium: 3.7 mmol/L (ref 3.5–5.1)
Sodium: 137 mmol/L (ref 135–145)
Total Bilirubin: 0.2 mg/dL (ref <1.2)
Total Protein: 6.7 g/dL (ref 6.5–8.1)

## 2023-03-17 LAB — URINALYSIS, ROUTINE W REFLEX MICROSCOPIC
Bilirubin Urine: NEGATIVE
Glucose, UA: NEGATIVE mg/dL
Hgb urine dipstick: NEGATIVE
Ketones, ur: 5 mg/dL — AB
Leukocytes,Ua: NEGATIVE
Nitrite: NEGATIVE
Protein, ur: NEGATIVE mg/dL
Specific Gravity, Urine: 1.012 (ref 1.005–1.030)
pH: 5 (ref 5.0–8.0)

## 2023-03-17 LAB — LIPASE, BLOOD: Lipase: 21 U/L (ref 11–51)

## 2023-03-17 LAB — CBC
HCT: 35.9 % — ABNORMAL LOW (ref 39.0–52.0)
Hemoglobin: 11.7 g/dL — ABNORMAL LOW (ref 13.0–17.0)
MCH: 32.9 pg (ref 26.0–34.0)
MCHC: 32.6 g/dL (ref 30.0–36.0)
MCV: 100.8 fL — ABNORMAL HIGH (ref 80.0–100.0)
Platelets: 298 10*3/uL (ref 150–400)
RBC: 3.56 MIL/uL — ABNORMAL LOW (ref 4.22–5.81)
RDW: 12.9 % (ref 11.5–15.5)
WBC: 7.8 10*3/uL (ref 4.0–10.5)
nRBC: 0 % (ref 0.0–0.2)

## 2023-03-17 MED ORDER — DIPHENHYDRAMINE HCL 50 MG/ML IJ SOLN
50.0000 mg | Freq: Once | INTRAMUSCULAR | Status: AC
Start: 2023-03-17 — End: 2023-03-17
  Filled 2023-03-17: qty 1

## 2023-03-17 MED ORDER — METHYLPREDNISOLONE SODIUM SUCC 40 MG IJ SOLR
40.0000 mg | Freq: Once | INTRAMUSCULAR | Status: AC
  Administered 2023-03-17: 40 mg via INTRAVENOUS
  Filled 2023-03-17: qty 1

## 2023-03-17 MED ORDER — SODIUM CHLORIDE 0.9 % IV BOLUS
1000.0000 mL | Freq: Once | INTRAVENOUS | Status: AC
  Administered 2023-03-17: 1000 mL via INTRAVENOUS

## 2023-03-17 MED ORDER — DIPHENHYDRAMINE HCL 25 MG PO CAPS
50.0000 mg | ORAL_CAPSULE | Freq: Once | ORAL | Status: AC
Start: 2023-03-17 — End: 2023-03-17
  Administered 2023-03-17: 50 mg via ORAL
  Filled 2023-03-17: qty 2

## 2023-03-17 NOTE — ED Provider Notes (Signed)
[facility] Provider Note   Event Date/Time   First MD Initiated Contact with Patient 03/17/23 1959     (approximate) History  Abdominal Pain  HPI Riley Martin is a 77 y.o. male with a past medical history of COPD, GERD, BPH, and iron deficiency anemia who presents for ongoing bilateral lower quadrant abdominal pain with associated vomiting that has been going on for weeks.  Patient states this been worsening over the last week and presented to the primary care physician's office yesterday before being called today and told to come to the emergency department for a contrasted CT scan. ROS: Patient currently denies any vision changes, tinnitus, difficulty speaking, facial droop, sore throat, chest pain, shortness of breath, diarrhea, dysuria, or weakness/numbness/paresthesias in any extremity   Physical Exam  Triage Vital Signs: ED Triage Vitals  Encounter Vitals Group     BP 03/17/23 1758 121/68     Systolic BP Percentile --      Diastolic BP Percentile --      Pulse Rate 03/17/23 1758 89     Resp 03/17/23 1758 18     Temp 03/17/23 1758 97.9 F (36.6 C)     Temp Source 03/17/23 1758 Oral     SpO2 03/17/23 1758 100 %     Weight --      Height --      Head Circumference --      Peak Flow --      Pain Score 03/17/23 1757 5     Pain Loc --      Pain Education --      Exclude from Growth Chart --    Most recent vital signs: Vitals:   03/17/23 2230 03/18/23 0115  BP: 125/71 137/81  Pulse: 72 71  Resp: 20 20  Temp: 98.1 F (36.7 C) 98 F (36.7 C)  SpO2: 99% 99%   General: Awake, oriented x4. CV:  Good peripheral perfusion.  Resp:  Normal effort.  Abd:  No distention.  Other:  Elderly well-developed, well-nourished Caucasian male resting comfortably in no acute distress ED Results / Procedures / Treatments  Labs (all labs ordered are listed, but only abnormal results are displayed) Labs Reviewed  COMPREHENSIVE METABOLIC PANEL - Abnormal;  Notable for the following components:      Result Value   BUN 29 (*)    Creatinine, Ser 2.14 (*)    Calcium 8.6 (*)    Albumin 3.4 (*)    GFR, Estimated 31 (*)    All other components within normal limits  CBC - Abnormal; Notable for the following components:   RBC 3.56 (*)    Hemoglobin 11.7 (*)    HCT 35.9 (*)    MCV 100.8 (*)    All other components within normal limits  URINALYSIS, ROUTINE W REFLEX MICROSCOPIC - Abnormal; Notable for the following components:   Color, Urine YELLOW (*)    APPearance CLEAR (*)    Ketones, ur 5 (*)    All other components within normal limits  LIPASE, BLOOD  RADIOLOGY ED MD interpretation: Pending -Agree with radiology assessment Official radiology report(s): CT ABDOMEN PELVIS W CONTRAST  Result Date: 03/18/2023 CLINICAL DATA:  Ongoing abdominal pain and vomiting. EXAM: CT ABDOMEN AND PELVIS WITH CONTRAST TECHNIQUE: Multidetector CT imaging of the abdomen and pelvis was performed using the standard protocol following bolus administration of intravenous contrast. RADIATION DOSE REDUCTION: This exam was performed according to the departmental dose-optimization program which includes automated exposure  control, adjustment of the mA and/or kV according to patient size and/or use of iterative reconstruction technique. CONTRAST:  80mL OMNIPAQUE IOHEXOL 350 MG/ML SOLN COMPARISON:  Abdominal radiograph 03/15/2023 report (no images are available) and CT abdomen and pelvis 03/12/2023 FINDINGS: Lower chest: No acute abnormality. Hepatobiliary: Distended bladder. No biliary dilation or radiopaque stone. Unremarkable liver. Pancreas: Fatty atrophy without acute abnormality. Spleen: Unremarkable. Adrenals/Urinary Tract: Normal adrenal glands. Bilateral cortical renal scarring. Nonobstructing punctate left nephrolithiasis. No ureteral calculi or hydronephrosis. Unremarkable bladder. Stomach/Bowel: Normal caliber large and small bowel. No bowel wall thickening. Colonic  diverticulosis without diverticulitis. Normal appendix. Stomach is within normal limits. Vascular/Lymphatic: Aortic atherosclerosis. No enlarged abdominal or pelvic lymph nodes. Reproductive: Unremarkable. Other: No free intraperitoneal fluid or air. Musculoskeletal: No acute fracture. 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 IMPRESSION: 1. No acute abnormality in the abdomen or pelvis. Specifically no bowel obstruction. 2. Nonobstructing punctate left nephrolithiasis. 3. Colonic diverticulosis without diverticulitis. Aortic Atherosclerosis (ICD10-I70.0). Electronically Signed   By: Minerva Fester M.D.   On: 03/18/2023 00:58   PROCEDURES: Critical Care performed: No Procedures MEDICATIONS ORDERED IN ED: Medications  methylPREDNISolone sodium succinate (SOLU-MEDROL) 40 mg/mL injection 40 mg (40 mg Intravenous Given 03/17/23 2029)  diphenhydrAMINE (BENADRYL) capsule 50 mg (50 mg Oral Given 03/17/23 2322)    Or  diphenhydrAMINE (BENADRYL) injection 50 mg ( Intravenous See Alternative 03/17/23 2322)  sodium chloride 0.9 % bolus 1,000 mL (0 mLs Intravenous Stopped 03/17/23 2256)  iohexol (OMNIPAQUE) 350 MG/ML injection 80 mL (80 mLs Intravenous Contrast Given 03/18/23 0026)   IMPRESSION / MDM / ASSESSMENT AND PLAN / ED COURSE  I reviewed the triage vital signs and the nursing notes.                             The patient is on the cardiac monitor to evaluate for evidence of arrhythmia and/or significant heart rate changes. Patient's presentation is most consistent with acute presentation with potential threat to life or bodily function. Patient presents for abdominal pain.  Differential diagnosis includes appendicitis, abdominal aortic aneurysm, surgical biliary disease, pancreatitis, SBO, mesenteric ischemia, serious intra-abdominal bacterial illness, genital torsion. Doubt atypical ACS. Care of this patient will be signed out to the oncoming physician at the end of my shift.   All pertinent patient information conveyed and all questions answered.  All further care and disposition decisions will be made by the oncoming physician.   FINAL CLINICAL IMPRESSION(S) / ED DIAGNOSES   Final diagnoses:  Chronic bilateral lower abdominal pain   Rx / DC Orders   ED Discharge Orders     None      Note:  This document was prepared using Dragon voice recognition software and may include unintentional dictation errors.   Merwyn Katos, MD 03/18/23 [phone]

## 2023-03-17 NOTE — ED Triage Notes (Signed)
Pt was seen at physician yesterday for ongoing abdominal pain and vomiting.  Had an x-ray.  Pt reports he received a call to come to the ED for CT scan.  Ongoing n/v/d x weeks.

## 2023-03-18 ENCOUNTER — Emergency Department: Payer: BC Managed Care – PPO

## 2023-03-18 DIAGNOSIS — R1031 Right lower quadrant pain: Secondary | ICD-10-CM | POA: Diagnosis not present

## 2023-03-18 MED ORDER — IOHEXOL 350 MG/ML SOLN
80.0000 mL | Freq: Once | INTRAVENOUS | Status: AC | PRN
  Administered 2023-03-18: 80 mL via INTRAVENOUS

## 2023-03-18 NOTE — ED Provider Notes (Signed)
Patient received in signout from Dr. Vicente Males pending CT abdomen/pelvis.  This returns without evidence of acute features.  Patient tells me about his chronic, 8 weeks of intermittent abdominal pain and has upcoming GI appointment the end of January.  He is tolerating p.o. intake here and suitable for outpatient management.  I provide him with alternative GI follow-up information.  We discussed ED return precautions.  He has Zofran at home   Delton Prairie, MD 03/18/23 [phone]

## 2023-06-22 ENCOUNTER — Inpatient Hospital Stay: Payer: BC Managed Care – PPO

## 2023-06-25 ENCOUNTER — Telehealth: Payer: Self-pay | Admitting: Oncology

## 2023-06-25 NOTE — Telephone Encounter (Signed)
 Per secure chat, appts on wed. 06/26/23 will need to be virtual or r/s to another day due to weather.   Called and left vm for pt to call us back to change his appt for what is best for him

## 2023-06-27 ENCOUNTER — Inpatient Hospital Stay: Payer: BC Managed Care – PPO | Admitting: Oncology

## 2023-06-27 NOTE — Assessment & Plan Note (Deleted)
#  Iron deficiency anemia, anemia secondary to chronic kidney disease. Labs reviewed and discussed with patient.   Lab Results  Component Value Date   HGB 11.7 (L) 03/17/2023   TIBC 276 12/12/2022   IRONPCTSAT 24 12/12/2022   FERRITIN 199 12/12/2022     Hemoglobin is normal, iron panel is stablem ferritin close to goal.no need for IV venofer Continue monitor. Continue oral iron supplementation

## 2023-07-04 ENCOUNTER — Encounter: Payer: Self-pay | Admitting: Oncology

## 2023-07-04 ENCOUNTER — Inpatient Hospital Stay: Payer: BC Managed Care – PPO | Attending: Oncology | Admitting: Oncology

## 2023-07-04 ENCOUNTER — Inpatient Hospital Stay: Payer: BC Managed Care – PPO

## 2023-07-04 VITALS — BP 127/55 | HR 88 | Temp 98.0°F | Resp 18 | Wt 145.4 lb

## 2023-07-04 DIAGNOSIS — K219 Gastro-esophageal reflux disease without esophagitis: Secondary | ICD-10-CM | POA: Diagnosis not present

## 2023-07-04 DIAGNOSIS — Z8616 Personal history of COVID-19: Secondary | ICD-10-CM | POA: Diagnosis not present

## 2023-07-04 DIAGNOSIS — Z888 Allergy status to other drugs, medicaments and biological substances status: Secondary | ICD-10-CM | POA: Insufficient documentation

## 2023-07-04 DIAGNOSIS — N400 Enlarged prostate without lower urinary tract symptoms: Secondary | ICD-10-CM | POA: Diagnosis not present

## 2023-07-04 DIAGNOSIS — R5383 Other fatigue: Secondary | ICD-10-CM | POA: Diagnosis not present

## 2023-07-04 DIAGNOSIS — D631 Anemia in chronic kidney disease: Secondary | ICD-10-CM | POA: Insufficient documentation

## 2023-07-04 DIAGNOSIS — R110 Nausea: Secondary | ICD-10-CM | POA: Insufficient documentation

## 2023-07-04 DIAGNOSIS — N1832 Chronic kidney disease, stage 3b: Secondary | ICD-10-CM | POA: Diagnosis not present

## 2023-07-04 DIAGNOSIS — D7589 Other specified diseases of blood and blood-forming organs: Secondary | ICD-10-CM | POA: Diagnosis not present

## 2023-07-04 DIAGNOSIS — I129 Hypertensive chronic kidney disease with stage 1 through stage 4 chronic kidney disease, or unspecified chronic kidney disease: Secondary | ICD-10-CM | POA: Insufficient documentation

## 2023-07-04 DIAGNOSIS — J449 Chronic obstructive pulmonary disease, unspecified: Secondary | ICD-10-CM | POA: Insufficient documentation

## 2023-07-04 DIAGNOSIS — Z79899 Other long term (current) drug therapy: Secondary | ICD-10-CM | POA: Diagnosis not present

## 2023-07-04 DIAGNOSIS — D472 Monoclonal gammopathy: Secondary | ICD-10-CM | POA: Insufficient documentation

## 2023-07-04 DIAGNOSIS — Z85828 Personal history of other malignant neoplasm of skin: Secondary | ICD-10-CM | POA: Diagnosis not present

## 2023-07-04 DIAGNOSIS — Z885 Allergy status to narcotic agent status: Secondary | ICD-10-CM | POA: Insufficient documentation

## 2023-07-04 DIAGNOSIS — K5520 Angiodysplasia of colon without hemorrhage: Secondary | ICD-10-CM | POA: Insufficient documentation

## 2023-07-04 DIAGNOSIS — E1122 Type 2 diabetes mellitus with diabetic chronic kidney disease: Secondary | ICD-10-CM | POA: Insufficient documentation

## 2023-07-04 DIAGNOSIS — G894 Chronic pain syndrome: Secondary | ICD-10-CM | POA: Diagnosis not present

## 2023-07-04 DIAGNOSIS — R0602 Shortness of breath: Secondary | ICD-10-CM | POA: Insufficient documentation

## 2023-07-04 DIAGNOSIS — Z87891 Personal history of nicotine dependence: Secondary | ICD-10-CM | POA: Insufficient documentation

## 2023-07-04 DIAGNOSIS — K589 Irritable bowel syndrome without diarrhea: Secondary | ICD-10-CM | POA: Diagnosis not present

## 2023-07-04 DIAGNOSIS — R634 Abnormal weight loss: Secondary | ICD-10-CM | POA: Insufficient documentation

## 2023-07-04 DIAGNOSIS — Z7901 Long term (current) use of anticoagulants: Secondary | ICD-10-CM | POA: Diagnosis not present

## 2023-07-04 DIAGNOSIS — D509 Iron deficiency anemia, unspecified: Secondary | ICD-10-CM | POA: Insufficient documentation

## 2023-07-04 DIAGNOSIS — F32A Depression, unspecified: Secondary | ICD-10-CM | POA: Insufficient documentation

## 2023-07-04 DIAGNOSIS — R112 Nausea with vomiting, unspecified: Secondary | ICD-10-CM | POA: Insufficient documentation

## 2023-07-04 DIAGNOSIS — E538 Deficiency of other specified B group vitamins: Secondary | ICD-10-CM | POA: Diagnosis not present

## 2023-07-04 LAB — CBC WITH DIFFERENTIAL (CANCER CENTER ONLY)
Abs Immature Granulocytes: 0.12 10*3/uL — ABNORMAL HIGH (ref 0.00–0.07)
Basophils Absolute: 0.1 10*3/uL (ref 0.0–0.1)
Basophils Relative: 1 %
Eosinophils Absolute: 0.1 10*3/uL (ref 0.0–0.5)
Eosinophils Relative: 2 %
HCT: 32.3 % — ABNORMAL LOW (ref 39.0–52.0)
Hemoglobin: 10.9 g/dL — ABNORMAL LOW (ref 13.0–17.0)
Immature Granulocytes: 2 %
Lymphocytes Relative: 23 %
Lymphs Abs: 1.5 10*3/uL (ref 0.7–4.0)
MCH: 34.2 pg — ABNORMAL HIGH (ref 26.0–34.0)
MCHC: 33.7 g/dL (ref 30.0–36.0)
MCV: 101.3 fL — ABNORMAL HIGH (ref 80.0–100.0)
Monocytes Absolute: 0.7 10*3/uL (ref 0.1–1.0)
Monocytes Relative: 10 %
Neutro Abs: 3.9 10*3/uL (ref 1.7–7.7)
Neutrophils Relative %: 62 %
Platelet Count: 445 10*3/uL — ABNORMAL HIGH (ref 150–400)
RBC: 3.19 MIL/uL — ABNORMAL LOW (ref 4.22–5.81)
RDW: 12.6 % (ref 11.5–15.5)
WBC Count: 6.3 10*3/uL (ref 4.0–10.5)
nRBC: 0 % (ref 0.0–0.2)

## 2023-07-04 LAB — TECHNOLOGIST SMEAR REVIEW: Plt Morphology: INCREASED

## 2023-07-04 LAB — VITAMIN B12: Vitamin B-12: 1110 pg/mL — ABNORMAL HIGH (ref 180–914)

## 2023-07-04 LAB — IRON AND TIBC
Iron: 62 ug/dL (ref 45–182)
Saturation Ratios: 23 % (ref 17.9–39.5)
TIBC: 269 ug/dL (ref 250–450)
UIBC: 207 ug/dL

## 2023-07-04 LAB — FERRITIN: Ferritin: 386 ng/mL — ABNORMAL HIGH (ref 24–336)

## 2023-07-04 LAB — RETIC PANEL
Immature Retic Fract: 15.8 % (ref 2.3–15.9)
RBC.: 3.15 MIL/uL — ABNORMAL LOW (ref 4.22–5.81)
Retic Count, Absolute: 71.2 10*3/uL (ref 19.0–186.0)
Retic Ct Pct: 2.3 % (ref 0.4–3.1)
Reticulocyte Hemoglobin: 34.1 pg (ref >27.9)

## 2023-07-04 MED ORDER — ONDANSETRON 4 MG PO TBDP: 4.0000 mg | ORAL_TABLET | Freq: Three times a day (TID) | ORAL | 0 refills | Status: AC | PRN

## 2023-07-04 NOTE — Assessment & Plan Note (Signed)
 I sent a prescription of Zofran as needed per patient's request.  I recommend patient to follow-up with GI and PCP for further evaluation.

## 2023-07-04 NOTE — Assessment & Plan Note (Addendum)
 IgG kappa MGUS Lab Results  Component Value Date   MPROTEIN 0.2 (H) 07/12/2021   KPAFRELGTCHN 72.1 (H) 07/12/2021   LAMBDASER 20.8 07/12/2021   KAPLAMBRATIO 3.47 (H) 07/12/2021   Today's levels are pending I recommend observation.  Check SPEP and light chain ratio every 6 months

## 2023-07-04 NOTE — Progress Notes (Signed)
 Hematology/Oncology Progress note [phone] [phone]) [phone]       CHIEF COMPLAINTS/REASON FOR VISIT:  Follow up for anemia  ASSESSMENT & PLAN:   Anemia of chronic renal failure, stage 3b (HCC) #Iron deficiency anemia, anemia secondary to chronic kidney disease. Labs reviewed and discussed with patient.   Lab Results  Component Value Date   HGB 10.9 (L) 07/04/2023   TIBC 269 07/04/2023   IRONPCTSAT 23 07/04/2023   FERRITIN 386 (H) 07/04/2023     Hemoglobin is normal, iron panel is stablem ferritin elevated.no need for IV venofer Continue monitor. 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  Check SPEP and light chain ratio every 6 months   Low serum vitamin B12 B12 is pending.  continue sublingual B12 2500 mcg daily.  Macrocytosis B12 has improved, within normal limites.  Macrocytosis persist, he may have underlying bone marrow disorders.  bone marrow biopsy vs observation. Shared decision was made to continue observation.  Nausea without vomiting I sent a prescription of Zofran as needed per patient's request.  I recommend patient to follow-up with GI and PCP for further evaluation.   Orders Placed This Encounter  Procedures   Kappa/lambda light chains    Standing Status:   Future    Number of Occurrences:   1    Expected Date:   07/04/2023    Expiration Date:   07/03/2024   Multiple Myeloma Panel (SPEP&IFE w/QIG)    Standing Status:   Future    Number of Occurrences:   1    Expected Date:   07/04/2023    Expiration Date:   07/03/2024   Ferritin    Standing Status:   Future    Number of Occurrences:   1    Expected Date:   07/04/2023    Expiration Date:    01/01/2024   Iron and TIBC    Standing Status:   Future    Number of Occurrences:   1    Expected Date:   07/04/2023    Expiration Date:   07/03/2024   Follow up in 6 months.  All questions were answered. The patient knows to call the clinic with any problems, questions or concerns.  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  Hospitalized from 05/10/21 - 05/13/21 due to dizziness/lightlessness, acute drop of hemoglobin to 5.7.  He was transfused multiple units of PRBCs during his hospitalization, and IV iron.  He underwent EGD- which is not remarkable.  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 His Xarelto was held during admission and resumed.  He continues to feel week and was referred to hematology for further evaluation of anemia.  Accompanied by wife. Appetite is good.    INTERVAL HISTORY Riley Martin is a 78 y.o. male who has above history reviewed by me today presents for follow up visit for anemia.  Chronic fatigue, unchanged. Chronic respiratory failure on nasal cannula oxygen. He has lost weight since last visit in August 2024.  Recent admission to [facility] from 07/01/2023 - 07/03/2023 due to lightheadedness/syncope.  He was recently started torsemide and spironolactone on 06/20/2023 for generalized, medications were discontinued He was also treated for COPD exacerbation. He has a follow-up appointment with primary care provider this week.  Patient reports feeling nauseated and the Zofran helps relieving his symptoms.  He has chronic epigastric discomfort.  Patient request Zofran refill.    Review of Systems  Constitutional:  Positive for fatigue. Negative for appetite change, chills, fever and  unexpected weight change.  HENT:   Negative for hearing loss and voice change.   Eyes:  Negative for eye problems and icterus.  Respiratory:  Positive for shortness of breath. Negative for chest tightness and cough.   Cardiovascular:  Negative for chest pain and leg swelling.  Gastrointestinal:  Negative for abdominal distention and abdominal pain.  Endocrine: Negative for hot flashes.  Genitourinary:  Negative for difficulty urinating, dysuria and frequency.   Musculoskeletal:  Negative for arthralgias.  Skin:  Negative for itching and rash.  Neurological:  Negative for light-headedness and numbness.  Hematological:  Negative for adenopathy. Does not bruise/bleed easily.  Psychiatric/Behavioral:  Negative for confusion.     MEDICAL HISTORY:  Past Medical History:  Diagnosis Date   Arthritis    BPH (benign prostatic hyperplasia)    Cancer (HCC)    skin cancer   Chronic pain    COPD (chronic obstructive pulmonary disease) (HCC)    COVID    GERD (gastroesophageal reflux disease)    Headache    History of insomnia    IBS (irritable bowel syndrome)    Iron deficiency anemia due to chronic blood loss 07/12/2021   Orthostatic dizziness    PONV (postoperative nausea and vomiting)    Spinal stenosis    Torn rotator cuff    left    SURGICAL HISTORY: Past Surgical History:  Procedure Laterality Date   COLONOSCOPY     COLONOSCOPY WITH PROPOFOL N/A 05/13/2021   Procedure: COLONOSCOPY WITH PROPOFOL;  Surgeon: Wyline Mood, MD;  Location: [facility] ENDOSCOPY;  Service: Gastroenterology;  Laterality: N/A;   ESOPHAGOGASTRODUODENOSCOPY N/A 03/14/2021   Procedure: ESOPHAGOGASTRODUODENOSCOPY (EGD);  Surgeon: Jaynie Collins, DO;  Location: [facility] ENDOSCOPY;  Service: Gastroenterology;  Laterality: N/A;   ESOPHAGOGASTRODUODENOSCOPY N/A 05/13/2021   Procedure: ESOPHAGOGASTRODUODENOSCOPY (EGD);  Surgeon: Wyline Mood, MD;  Location: [facility] ENDOSCOPY;  Service: Gastroenterology;  Laterality: N/A;    EXCISION CHONCHA BULLOSA Bilateral 06/02/2015   Procedure: BILATERAL CHONCHA BULLOSA;  Surgeon: Bud Face, MD;  Location: [facility] SURGERY CNTR;  Service: ENT;  Laterality: Bilateral;   HERNIA REPAIR     MAXILLARY ANTROSTOMY Bilateral 06/02/2015   Procedure: MAXILLARY ANTROSTOMY;  Surgeon: Bud Face, MD;  Location: [facility]GERY [facility];  Service: ENT;  Laterality: Bilateral;   NASAL POLYP SURGERY     NECK SURGERY  05/08/2006   no limitations   UPPER GASTROINTESTINAL ENDOSCOPY      SOCIAL HISTORY: Social History   Socioeconomic History   Marital status: Married    Spouse name: Not on file   Number of children: Not on file   Years of education: Not on file   Highest education level: Not on file  Occupational History   Not on file  Tobacco Use   Smoking status: Former    Current packs/day: 0.00    Average packs/day: 1 pack/day for 57.0 years (57.0 ttl pk-yrs)    Types: Cigarettes    Start date: 69    Quit date: 2020  Years since quitting: 5.1   Smokeless tobacco: Never  Vaping Use   Vaping status: Never Used  Substance and Sexual Activity   Alcohol use: No   Drug use: No   Sexual activity: Not on file  Other Topics Concern   Not on file  Social History Narrative   Not on file   Social Drivers of Health   Financial Resource Strain: Low Risk  (03/01/2023)   Received from [facility]   Overall Financial Resource Strain (CARDIA)    Difficulty of Paying Living Expenses: Not hard at all  Food Insecurity: No Food Insecurity (03/01/2023)   Received from [facility]   Hunger Vital Sign    Ran Out of Food in the Last Year: Never true    Worried About Running Out of Food in the Last Year: Never true  Transportation Needs: No Transportation Needs (07/03/2023)   Received from [facility] - Transportation    In the past 12 months, has lack of transportation kept you from medical appointments or from  getting medications?: No    Lack of Transportation (Non-Medical): No  Physical Activity: Not on file  Stress: Not on file  Social Connections: Not on file  Intimate Partner Violence: Not on file    FAMILY HISTORY: History reviewed. No pertinent family history.  ALLERGIES:  is allergic to iodinated contrast media, lisinopril, tramadol, and pregabalin.  MEDICATIONS:  Current Outpatient Medications  Medication Sig Dispense Refill   albuterol (PROVENTIL) (2.5 MG/3ML) 0.083% nebulizer solution Take 3 mLs by nebulization every 6 (six) hours as needed for wheezing or shortness of breath. 75 mL 0   amiodarone (PACERONE) 200 MG tablet Take 1 tablet (200 mg total) by mouth 2 (two) times daily. 60 tablet 1   apixaban (ELIQUIS) 2.5 MG TABS tablet Take by mouth.     blood glucose meter kit and supplies KIT Dispense based on patient and insurance preference. Use up to four times daily as directed. 1 each 0   Cyanocobalamin (B-12) 2500 MCG SUBL Place 1 tablet under the tongue daily. 90 tablet 1   doxycycline (VIBRAMYCIN) 100 MG capsule Take 100 mg by mouth 2 (two) times daily.     fluticasone (FLONASE) 50 MCG/ACT nasal spray SPRAY 2 SPRAYS INTO EACH NOSTRIL EVERY DAY 16 g 0   furosemide (LASIX) 20 MG tablet Take 1 tablet (20 mg total) by mouth 2 (two) times daily. Resume it after checking kidney function and after discussing with primary care doctor 30 tablet    hydrocortisone 2.5 % cream Apply topically.     ipratropium (ATROVENT) 0.02 % nebulizer solution Take 0.5 mg by nebulization 2 (two) times daily.     metoprolol tartrate (LOPRESSOR) 25 MG tablet Take 25 mg by mouth daily.     mometasone-formoterol (DULERA) 200-5 MCG/ACT AERO Inhale 2 puffs into the lungs 2 (two) times daily. 1 each 1   Nutritional Supplements (FEEDING SUPPLEMENT, NEPRO CARB STEADY,) LIQD Take 237 mLs by mouth 3 (three) times daily between meals.  0   oxyCODONE-acetaminophen (PERCOCET) 10-325 MG tablet Take 1 tablet by mouth 5  (five) times daily as needed.     OXYCONTIN 10 MG 12 hr tablet Take 10 mg by mouth 3 (three) times daily.     sodium chloride (OCEAN) 0.65 % SOLN nasal spray Place 1 spray into both nostrils as needed for congestion. 30 mL 1   tamsulosin (FLOMAX) 0.4 MG CAPS capsule Take 0.4 mg  by mouth daily.     VENTOLIN HFA 108 (90 Base) MCG/ACT inhaler Inhale 2 puffs into the lungs every 6 (six) hours as needed.     budesonide (PULMICORT) 1 MG/2ML nebulizer solution Take 1 mg by nebulization daily. (Patient not taking: Reported on 05/02/2022)     Insulin Pen Needle (PEN NEEDLES) 31G X 6 MM MISC 1 each by Does not apply route 3 (three) times daily with meals. (Patient not taking: Reported on 07/12/2021) 100 each 0   metoCLOPramide (REGLAN) 10 MG tablet Take 1 tablet (10 mg total) by mouth 3 (three) times daily with meals for 21 days. (Patient not taking: Reported on 07/04/2023) 63 tablet 0   mirtazapine (REMERON) 15 MG tablet Take 15 mg by mouth at bedtime. (Patient not taking: Reported on 05/02/2022)     Multiple Vitamin (MULTIVITAMIN WITH MINERALS) TABS tablet Take 1 tablet by mouth daily. (Patient not taking: Reported on 07/04/2023)     naloxone [facility]) nasal spray 4 mg/0.1 mL Place 0.4 mg into the nose once. CALL 911. SPR CONTENTS OF ONE SPRAYER (0.1ML) INTO ONE NOSTRIL. REPEAT IN 2-3 MIN IF SYMPTOMS OF OPIOID EMERGENCY PERSIST, ALTERNATE NOSTRILS 11/22/2021 (Patient not taking: Reported on 06/12/2022)     ondansetron (ZOFRAN-ODT) 4 MG disintegrating tablet Take 1 tablet (4 mg total) by mouth every 8 (eight) hours as needed for nausea or vomiting. 30 tablet 0   spironolactone (ALDACTONE) 25 MG tablet Take 25 mg by mouth daily. (Patient not taking: Reported on 12/12/2022)     No current facility-administered medications for this visit.     PHYSICAL EXAMINATION: ECOG PERFORMANCE STATUS: 2 - Symptomatic, <50% confined to bed Vitals:   07/04/23 1057  BP: (!) 127/55  Pulse: 88  Resp: 18  Temp: 98 F (36.7 C)   SpO2: 96%   Filed Weights   07/04/23 1057  Weight: 145 lb 6.4 oz (66 kg)    Physical Exam Constitutional:      General: He is not in acute distress. HENT:     Head: Normocephalic and atraumatic.  Eyes:     General: No scleral icterus. Cardiovascular:     Rate and Rhythm: Normal rate and regular rhythm.     Heart sounds: Normal heart sounds.  Pulmonary:     Effort: Pulmonary effort is normal. No respiratory distress.     Breath sounds: Wheezing present.     Comments: Severely decreased breath sound.  Abdominal:     General: Bowel sounds are normal. There is no distension.     Palpations: Abdomen is soft.  Musculoskeletal:        General: No deformity. Normal range of motion.     Cervical back: Normal range of motion and neck supple.  Skin:    General: Skin is warm and dry.     Coloration: Skin is pale.     Findings: No erythema or rash.  Neurological:     Mental Status: He is alert and oriented to person, place, and time. Mental status is at baseline.     Cranial Nerves: No cranial nerve deficit.     Coordination: Coordination normal.  Psychiatric:        Mood and Affect: Mood normal.     LABORATORY DATA:  I have reviewed the data as listed    Latest Ref Rng & Units 07/04/2023   12:00 PM 03/17/2023    6:00 PM 03/12/2023    3:41 PM  CBC  WBC 4.0 - 10.5 K/uL 6.3  7.8  7.8   Hemoglobin 13.0 - 17.0 g/dL 82.9  56.2  13.0   Hematocrit 39.0 - 52.0 % 32.3  35.9  36.5   Platelets 150 - 400 K/uL 445  298  255       Latest Ref Rng & Units 03/17/2023    6:00 PM 03/12/2023    3:41 PM 12/12/2022    1:57 PM  CMP  Glucose 70 - 99 mg/dL 87  865  95   BUN 8 - 23 mg/dL 29  27  26    Creatinine 0.61 - 1.24 mg/dL 7.84  6.96  2.95   Sodium 135 - 145 mmol/L 137  138  138   Potassium 3.5 - 5.1 mmol/L 3.7  4.0  3.7   Chloride 98 - 111 mmol/L 103  106  104   CO2 22 - 32 mmol/L 22  21  23    Calcium 8.9 - 10.3 mg/dL 8.6  8.8  8.9   Total Protein 6.5 - 8.1 g/dL 6.7  6.7  6.7   Total  Bilirubin <1.2 mg/dL 0.2  0.4  0.5   Alkaline Phos 38 - 126 U/L 49  43  46   AST 15 - 41 U/L 15  14  17    ALT 0 - 44 U/L 13  14  13       Iron/TIBC/Ferritin/ %Sat    Component Value Date/Time   IRON 62 07/04/2023 1200   TIBC 269 07/04/2023 1200   FERRITIN 386 (H) 07/04/2023 1200   IRONPCTSAT 23 07/04/2023 1200      RADIOGRAPHIC STUDIES: I have personally reviewed the radiological images as listed and agreed with the findings in the report. No results found.

## 2023-07-04 NOTE — Assessment & Plan Note (Signed)
 B12 has improved, within normal limites.  Macrocytosis persist, he may have underlying bone marrow disorders.  bone marrow biopsy vs observation. Shared decision was made to continue observation.

## 2023-07-04 NOTE — Assessment & Plan Note (Addendum)
 B12 is pending.  continue sublingual B12 2500 mcg daily.

## 2023-07-04 NOTE — Assessment & Plan Note (Addendum)
#  Iron deficiency anemia, anemia secondary to chronic kidney disease. Labs reviewed and discussed with patient.   Lab Results  Component Value Date   HGB 10.9 (L) 07/04/2023   TIBC 269 07/04/2023   IRONPCTSAT 23 07/04/2023   FERRITIN 386 (H) 07/04/2023     Hemoglobin is normal, iron panel is stablem ferritin elevated.no need for IV venofer Continue monitor. Continue oral iron supplementation

## 2023-07-05 LAB — KAPPA/LAMBDA LIGHT CHAINS
Kappa free light chain: 199 mg/L — ABNORMAL HIGH (ref 3.3–19.4)
Kappa, lambda light chain ratio: 11.24 — ABNORMAL HIGH (ref 0.26–1.65)
Lambda free light chains: 17.7 mg/L (ref 5.7–26.3)

## 2023-07-08 ENCOUNTER — Other Ambulatory Visit: Payer: Self-pay | Admitting: Oncology

## 2023-07-08 DIAGNOSIS — D472 Monoclonal gammopathy: Secondary | ICD-10-CM

## 2023-07-08 DIAGNOSIS — N1832 Chronic kidney disease, stage 3b: Secondary | ICD-10-CM

## 2023-07-08 LAB — MULTIPLE MYELOMA PANEL, SERUM
Albumin SerPl Elph-Mcnc: 3.1 g/dL (ref 2.9–4.4)
Albumin/Glob SerPl: 1.1 (ref 0.7–1.7)
Alpha 1: 0.3 g/dL (ref 0.0–0.4)
Alpha2 Glob SerPl Elph-Mcnc: 0.9 g/dL (ref 0.4–1.0)
B-Globulin SerPl Elph-Mcnc: 0.8 g/dL (ref 0.7–1.3)
Gamma Glob SerPl Elph-Mcnc: 0.9 g/dL (ref 0.4–1.8)
Globulin, Total: 2.9 g/dL (ref 2.2–3.9)
IgA: 66 mg/dL (ref 61–437)
IgG (Immunoglobin G), Serum: 495 mg/dL — ABNORMAL LOW (ref 603–1613)
IgM (Immunoglobulin M), Srm: 667 mg/dL — ABNORMAL HIGH (ref 15–143)
M Protein SerPl Elph-Mcnc: 0.4 g/dL — ABNORMAL HIGH
Total Protein ELP: 6 g/dL (ref 6.0–8.5)

## 2023-07-09 ENCOUNTER — Telehealth: Payer: Self-pay

## 2023-07-09 NOTE — Telephone Encounter (Signed)
-----   Message from Rickard Patience sent at 07/08/2023 11:29 PM EST ----- Please let patient know that MGUS blood work has worsen a bit, I recommend close monitor.  Please arrange follow up 3 months labs 8-10 days prior to MD  Labs are ordered.

## 2023-07-09 NOTE — Telephone Encounter (Signed)
 Called and left message with patient on voicemail that MGUS bloodwork has worsened a bit so Dr. Cathie Hoops is recommending closer monitoring. Dr. Cathie Hoops would like to arrange follow-up in 3 months with labs 8-10 days prior to MD. I will have Morrie Sheldon schedule and notify patient of date and time.

## 2023-07-27 ENCOUNTER — Other Ambulatory Visit: Payer: Self-pay | Admitting: Pulmonary Disease

## 2023-07-27 DIAGNOSIS — R918 Other nonspecific abnormal finding of lung field: Secondary | ICD-10-CM

## 2023-07-30 ENCOUNTER — Encounter
Admission: RE | Admit: 2023-07-30 | Discharge: 2023-07-30 | Disposition: A | Discharge status: Home / Self Care | Source: Ambulatory Visit | Attending: Pulmonary Disease | Admitting: Pulmonary Disease

## 2023-07-30 DIAGNOSIS — R918 Other nonspecific abnormal finding of lung field: Secondary | ICD-10-CM | POA: Diagnosis present

## 2023-07-30 LAB — GLUCOSE, CAPILLARY: Glucose-Capillary: 91 mg/dL (ref 70–99)

## 2023-07-30 MED ORDER — FLUDEOXYGLUCOSE F - 18 (FDG) INJECTION
8.1500 | Freq: Once | INTRAVENOUS | Status: AC | PRN
  Administered 2023-07-30: 8.15 via INTRAVENOUS

## 2023-09-27 ENCOUNTER — Other Ambulatory Visit: Payer: Self-pay | Admitting: Gastroenterology

## 2023-09-27 DIAGNOSIS — R634 Abnormal weight loss: Secondary | ICD-10-CM

## 2023-09-27 DIAGNOSIS — R112 Nausea with vomiting, unspecified: Secondary | ICD-10-CM

## 2023-09-27 DIAGNOSIS — R1084 Generalized abdominal pain: Secondary | ICD-10-CM

## 2023-09-27 DIAGNOSIS — R110 Nausea: Secondary | ICD-10-CM

## 2023-10-10 ENCOUNTER — Inpatient Hospital Stay

## 2023-10-10 ENCOUNTER — Encounter
Admission: RE | Disposition: A | Discharge status: Home / Self Care | Payer: Self-pay | Source: Home / Self Care | Attending: Internal Medicine

## 2023-10-10 ENCOUNTER — Ambulatory Visit
Admission: RE | Admit: 2023-10-10 | Discharge: 2023-10-10 | Disposition: A | Discharge status: Home / Self Care | Attending: Internal Medicine | Admitting: Internal Medicine

## 2023-10-10 ENCOUNTER — Ambulatory Visit: Admitting: Anesthesiology

## 2023-10-10 ENCOUNTER — Encounter: Payer: Self-pay | Admitting: Internal Medicine

## 2023-10-10 DIAGNOSIS — J449 Chronic obstructive pulmonary disease, unspecified: Secondary | ICD-10-CM | POA: Diagnosis not present

## 2023-10-10 DIAGNOSIS — I4891 Unspecified atrial fibrillation: Secondary | ICD-10-CM | POA: Diagnosis not present

## 2023-10-10 DIAGNOSIS — K219 Gastro-esophageal reflux disease without esophagitis: Secondary | ICD-10-CM | POA: Insufficient documentation

## 2023-10-10 DIAGNOSIS — Z9981 Dependence on supplemental oxygen: Secondary | ICD-10-CM | POA: Diagnosis not present

## 2023-10-10 DIAGNOSIS — N189 Chronic kidney disease, unspecified: Secondary | ICD-10-CM | POA: Insufficient documentation

## 2023-10-10 DIAGNOSIS — E1122 Type 2 diabetes mellitus with diabetic chronic kidney disease: Secondary | ICD-10-CM | POA: Diagnosis not present

## 2023-10-10 DIAGNOSIS — I129 Hypertensive chronic kidney disease with stage 1 through stage 4 chronic kidney disease, or unspecified chronic kidney disease: Secondary | ICD-10-CM | POA: Insufficient documentation

## 2023-10-10 DIAGNOSIS — I4510 Unspecified right bundle-branch block: Secondary | ICD-10-CM | POA: Diagnosis not present

## 2023-10-10 DIAGNOSIS — K2970 Gastritis, unspecified, without bleeding: Secondary | ICD-10-CM | POA: Insufficient documentation

## 2023-10-10 DIAGNOSIS — I493 Ventricular premature depolarization: Secondary | ICD-10-CM | POA: Insufficient documentation

## 2023-10-10 DIAGNOSIS — Z87891 Personal history of nicotine dependence: Secondary | ICD-10-CM | POA: Diagnosis not present

## 2023-10-10 DIAGNOSIS — R634 Abnormal weight loss: Secondary | ICD-10-CM | POA: Insufficient documentation

## 2023-10-10 DIAGNOSIS — Z7901 Long term (current) use of anticoagulants: Secondary | ICD-10-CM | POA: Insufficient documentation

## 2023-10-10 DIAGNOSIS — R110 Nausea: Secondary | ICD-10-CM | POA: Diagnosis present

## 2023-10-10 HISTORY — PX: ESOPHAGOGASTRODUODENOSCOPY: SHX5428

## 2023-10-10 HISTORY — DX: Unspecified atrial fibrillation: I48.91

## 2023-10-10 SURGERY — EGD (ESOPHAGOGASTRODUODENOSCOPY)
Anesthesia: General

## 2023-10-10 MED ORDER — PROPOFOL 500 MG/50ML IV EMUL
INTRAVENOUS | Status: DC | PRN
  Administered 2023-10-10: 50 ug/kg/min via INTRAVENOUS

## 2023-10-10 MED ORDER — PHENYLEPHRINE 80 MCG/ML (10ML) SYRINGE FOR IV PUSH (FOR BLOOD PRESSURE SUPPORT)
PREFILLED_SYRINGE | INTRAVENOUS | Status: AC
  Filled 2023-10-10: qty 10

## 2023-10-10 MED ORDER — SODIUM CHLORIDE 0.9 % IV SOLN: INTRAVENOUS | Status: DC

## 2023-10-10 MED ORDER — PROPOFOL 10 MG/ML IV BOLUS
INTRAVENOUS | Status: DC | PRN
  Administered 2023-10-10: 50 mg via INTRAVENOUS

## 2023-10-10 MED ORDER — LIDOCAINE HCL (CARDIAC) PF 100 MG/5ML IV SOSY
PREFILLED_SYRINGE | INTRAVENOUS | Status: DC | PRN
  Administered 2023-10-10: 60 mg via INTRAVENOUS

## 2023-10-10 NOTE — Interval H&P Note (Signed)
 History and Physical Interval Note:  10/10/2023 1:47 PM  Riley Martin  has presented today for surgery, with the diagnosis of Nausea (R11.0) Weight loss (R63.4).  The various methods of treatment have been discussed with the patient and family. After consideration of risks, benefits and other options for treatment, the patient has consented to  Procedure(s) with comments: EGD (ESOPHAGOGASTRODUODENOSCOPY) (N/A) - Eliquis  as a surgical intervention.  The patient's history has been reviewed, patient examined, no change in status, stable for surgery.  I have reviewed the patient's chart and labs.  Questions were answered to the patient's satisfaction.     [name], Rosha Cocker

## 2023-10-10 NOTE — H&P (Signed)
 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  Primary Physician: Monique Ano, M.D.  Reason for visit:  Nausea, weight loss  History of present illness:  Mr. Riley Martin reports having issues w/ nausea over the past few months, appears on chart review to be a chronic issue but worse recently. He endorses only eating a few bites of foods at a time, endoreses significant early satiety. He reports constant nausea - sometimes will worsen w/ eating. States he feels "sick" all the time and is not able to eat or complete his daily activities. He reports a cramping discomfort throughout his entire abdomen. He does not feel that eating worsens the abdominal pain. Does not feel having a bowel movement improves the pain. Does feel some burning in his esophagus, as well pantoprazole  40 mg daily.  Patient endorses having bowel movement every 3 days. Does not feel he is constipated as he takes MiraLAX  daily. Occasionally has straining but denies any rectal bleeding.  Wife accompanies with history. Denies alcohol tobacco. Uses NSAIDs occasionally with Advil.   PMHx of atrial fib, HTN, DM, severe COPD, respiratory failure on 4-6L Ox, pneumonia, GERD, BPH, CKD.   Current Facility-Administered Medications:    0.9 %  sodium chloride  infusion, , Intravenous, Continuous, [place], Camiah Humm K, MD, Last Rate: 20 mL/hr at 10/10/23 1329, Continued from Pre-op at 10/10/23 1329  Medications Prior to Admission  Medication Sig Dispense Refill Last Dose/Taking   albuterol  (PROVENTIL ) (2.5 MG/3ML) 0.083% nebulizer solution Take 3 mLs by nebulization every 6 (six) hours as needed for wheezing or shortness of breath. 75 mL 0 10/09/2023   amiodarone  (PACERONE ) 200 MG tablet Take 1 tablet (200 mg total) by mouth 2 (two) times daily. 60 tablet 1 10/09/2023   furosemide  (LASIX ) 20 MG tablet Take 1 tablet (20 mg total) by mouth 2 (two) times daily. Resume it after checking kidney function and after  discussing with primary care doctor 30 tablet  10/09/2023   metoprolol  tartrate (LOPRESSOR ) 25 MG tablet Take 25 mg by mouth daily.   10/09/2023   mirtazapine  (REMERON ) 15 MG tablet Take 15 mg by mouth at bedtime.   Past Week   mometasone -formoterol  (DULERA ) 200-5 MCG/ACT AERO Inhale 2 puffs into the lungs 2 (two) times daily. 1 each 1 10/09/2023   oxyCODONE -acetaminophen  (PERCOCET) 10-325 MG tablet Take 1 tablet by mouth 5 (five) times daily as needed.   10/10/2023   OXYCONTIN  10 MG 12 hr tablet Take 10 mg by mouth 3 (three) times daily.   10/10/2023   OXYGEN Inhale 2 L into the lungs at bedtime as needed.   Taking As Needed   VENTOLIN  HFA 108 (90 Base) MCG/ACT inhaler Inhale 2 puffs into the lungs every 6 (six) hours as needed.   Past Month   apixaban  (ELIQUIS ) 2.5 MG TABS tablet Take by mouth.   10/06/2023   blood glucose meter kit and supplies KIT Dispense based on patient and insurance preference. Use up to four times daily as directed. 1 each 0    budesonide  (PULMICORT ) 1 MG/2ML nebulizer solution Take 1 mg by nebulization daily. (Patient not taking: Reported on 05/02/2022)      Cyanocobalamin  (B-12) 2500 MCG SUBL Place 1 tablet under the tongue daily. 90 tablet 1    doxycycline  (VIBRAMYCIN ) 100 MG capsule Take 100 mg by mouth 2 (two) times daily.      fluticasone  (FLONASE ) 50 MCG/ACT nasal spray SPRAY 2 SPRAYS INTO EACH NOSTRIL EVERY DAY 16 g 0  hydrocortisone  2.5 % cream Apply topically.      Insulin  Pen Needle (PEN NEEDLES) 31G X 6 MM MISC 1 each by Does not apply route 3 (three) times daily with meals. (Patient not taking: Reported on 07/12/2021) 100 each 0    ipratropium (ATROVENT ) 0.02 % nebulizer solution Take 0.5 mg by nebulization 2 (two) times daily.      metoCLOPramide  (REGLAN ) 10 MG tablet Take 1 tablet (10 mg total) by mouth 3 (three) times daily with meals for 21 days. (Patient not taking: Reported on 07/04/2023) 63 tablet 0    Multiple Vitamin (MULTIVITAMIN WITH MINERALS) TABS tablet Take  1 tablet by mouth daily. (Patient not taking: Reported on 07/04/2023)      naloxone  (NARCAN ) nasal spray 4 mg/0.1 mL Place 0.4 mg into the nose once. CALL 911. SPR CONTENTS OF ONE SPRAYER (0.1ML) INTO ONE NOSTRIL. REPEAT IN 2-3 MIN IF SYMPTOMS OF OPIOID EMERGENCY PERSIST, ALTERNATE NOSTRILS 11/22/2021 (Patient not taking: Reported on 06/12/2022)      Nutritional Supplements (FEEDING SUPPLEMENT, NEPRO CARB STEADY,) LIQD Take 237 mLs by mouth 3 (three) times daily between meals.  0    ondansetron  (ZOFRAN -ODT) 4 MG disintegrating tablet Take 1 tablet (4 mg total) by mouth every 8 (eight) hours as needed for nausea or vomiting. 30 tablet 0    sodium chloride  (OCEAN) 0.65 % SOLN nasal spray Place 1 spray into both nostrils as needed for congestion. 30 mL 1    spironolactone (ALDACTONE) 25 MG tablet Take 25 mg by mouth daily. (Patient not taking: Reported on 12/12/2022)   Not Taking   tamsulosin  (FLOMAX ) 0.4 MG CAPS capsule Take 0.4 mg by mouth daily.        Allergies  Allergen Reactions   Iodinated Contrast Media Other (See Comments) and Nausea Only    Dry heaves, patient states he felt like he was on fire and about to pass out.  Pre-syncope, burning  Dry heaves, patient states he felt like he was on fire and about to pass out.    Lisinopril  Swelling    Tongue swelling, neck pain and Headaches    Tramadol Other (See Comments) and Shortness Of Breath    Other reaction(s): Other (See Comments) Other Reaction: tachy Dizziness and unsteady gait.   Pregabalin Palpitations    Other reaction(s): Other (See Comments) Other Reaction: edema     Past Medical History:  Diagnosis Date   A-fib (HCC)    Arthritis    BPH (benign prostatic hyperplasia)    Cancer (HCC)    skin cancer   Chronic pain    COPD (chronic obstructive pulmonary disease) (HCC)    COVID    GERD (gastroesophageal reflux disease)    Headache    History of insomnia    IBS (irritable bowel syndrome)    Iron  deficiency anemia due to  chronic blood loss 07/12/2021   Orthostatic dizziness    PONV (postoperative nausea and vomiting)    Spinal stenosis    Torn rotator cuff    left    Review of systems:  Otherwise negative.    Physical Exam  Gen: Alert, oriented. Appears stated age.  HEENT: [place]/AT. PERRLA. Lungs: CTA, no wheezes. CV: RR nl S1, S2. Abd: soft, benign, no masses. BS+ Ext: No edema. Pulses 2+    Planned procedures: Proceed with EGD.  The patient understands the nature of the planned procedure, indications, risks, alternatives and potential complications including but not limited to bleeding, infection, perforation, damage to internal organs  and possible oversedation/side effects from anesthesia. The patient agrees and gives consent to proceed.  Please refer to procedure notes for findings, recommendations and patient disposition/instructions.     Priyana Mccarey K. Corky Martin, M.D. Gastroenterology 10/10/2023  1:45 PM

## 2023-10-10 NOTE — Transfer of Care (Signed)
 Immediate Anesthesia Transfer of Care Note  Patient: Riley Martin  Procedure(s) Performed: EGD (ESOPHAGOGASTRODUODENOSCOPY)  Patient Location: PACU  Anesthesia Type:General  Level of Consciousness: sedated  Airway & Oxygen Therapy: Patient Spontanous Breathing  Post-op Assessment: Report given to RN and Post -op Vital signs reviewed and stable  Post vital signs: Reviewed and stable  Last Vitals:  Vitals Value Taken Time  BP 106/52 10/10/23 1407  Temp 35.6 C 10/10/23 1406  Pulse 33 10/10/23 1408  Resp 10 10/10/23 1408  SpO2 95 % 10/10/23 1408  Vitals shown include unfiled device data.  Last Pain:  Vitals:   10/10/23 1406  TempSrc: Temporal  PainSc: 0-No pain         Complications: No notable events documented.

## 2023-10-10 NOTE — Anesthesia Preprocedure Evaluation (Signed)
 Anesthesia Evaluation  Patient identified by MRN, date of birth, ID band Patient awake    Reviewed: Allergy & Precautions, NPO status , Patient's Chart, lab work & pertinent test results  History of Anesthesia Complications (+) PONV and history of anesthetic complications  Airway Mallampati: II  TM Distance: >3 FB Neck ROM: Full    Dental  (+) Edentulous Upper, Partial Lower   Pulmonary neg sleep apnea, COPD,  COPD inhaler and oxygen dependent, Patient abstained from smoking.Not current smoker, former smoker    + decreased breath sounds      Cardiovascular Exercise Tolerance: Good METShypertension, (-) CAD and (-) Past MI Normal cardiovascular exam+ dysrhythmias Atrial Fibrillation  Rhythm:Regular Rate:Normal - Systolic murmurs EKG  SR  RBBB  PVC  ECHO 2022 EF  60-65%   Neuro/Psych  Headaches PSYCHIATRIC DISORDERS  Depression       GI/Hepatic ,GERD  ,,(+)     (-) substance abuse    Endo/Other  diabetes    Renal/GU ARF and Renal InsufficiencyRenal disease     Musculoskeletal   Abdominal   Peds  Hematology  (+) Blood dyscrasia, anemia   Anesthesia Other Findings Past Medical History: No date: A-fib (HCC) No date: Arthritis No date: BPH (benign prostatic hyperplasia) No date: Cancer (HCC)     Comment:  skin cancer No date: Chronic pain No date: COPD (chronic obstructive pulmonary disease) (HCC) No date: COVID No date: GERD (gastroesophageal reflux disease) No date: Headache No date: History of insomnia No date: IBS (irritable bowel syndrome) 07/12/2021: Iron  deficiency anemia due to chronic blood loss No date: Orthostatic dizziness No date: PONV (postoperative nausea and vomiting) No date: Spinal stenosis No date: Torn rotator cuff     Comment:  left  Reproductive/Obstetrics                             Anesthesia Physical Anesthesia Plan  ASA: 3  Anesthesia Plan: General    Post-op Pain Management: Minimal or no pain anticipated   Induction: Intravenous  PONV Risk Score and Plan: 3 and Propofol  infusion, TIVA, Ondansetron  and Treatment may vary due to age or medical condition  Airway Management Planned: Natural Airway and Nasal Cannula  Additional Equipment: None  Intra-op Plan:   Post-operative Plan:   Informed Consent: I have reviewed the patients History and Physical, chart, labs and discussed the procedure including the risks, benefits and alternatives for the proposed anesthesia with the patient or authorized representative who has indicated his/her understanding and acceptance.     Dental Advisory Given  Plan Discussed with: Anesthesiologist, CRNA and Surgeon  Anesthesia Plan Comments: (Patient consented for risks of anesthesia including but not limited to:  - adverse reactions to medications - risk of airway placement if required - damage to eyes, teeth, lips or other oral mucosa - nerve damage due to positioning  - sore throat or hoarseness - Damage to heart, brain, nerves, lungs, other parts of body or loss of life  Patient voiced understanding.)        Anesthesia Quick Evaluation

## 2023-10-10 NOTE — Op Note (Signed)
 [facility] Patient Name: Riley Martin Procedure Date: 10/10/2023 1:53 PM MRN: [id number] Account #: [id number] Date of Birth: [birth year] Admit Type: Outpatient Age: 78 Room: [facility] ENDO ROOM 3 Gender: Male Note Status: Finalized Instrument Name: Upper Endoscope [id number] Procedure:             Upper GI endoscopy Indications:           Nausea, Weight loss Providers:             Mckoy Bhakta K. Francetta Ilg MD, MD Medicines:             Propofol  per Anesthesia Complications:         No immediate complications. Estimated blood loss:                         Minimal. Procedure:             Pre-Anesthesia Assessment:                        - The risks and benefits of the procedure and the                         sedation options and risks were discussed with the                         patient. All questions were answered and informed                         consent was obtained.                        - Patient identification and proposed procedure were                         verified prior to the procedure by the nurse. The                         procedure was verified in the procedure room.                        After obtaining informed consent, the endoscope was                         passed under direct vision. Throughout the procedure,                         the patient's blood pressure, pulse, and oxygen                         saturations were monitored continuously. The Endoscope                         was introduced through the mouth, and advanced to the                         third part of duodenum. The upper GI endoscopy was                         accomplished without difficulty. The patient tolerated  the procedure well. Findings:      The esophagus was normal.      A large amount of food (residue) was found in the gastric fundus, in the       gastric body and in the gastric antrum.      Scattered minimal inflammation  characterized by erythema was found in       the gastric antrum. Biopsies were taken with a cold forceps for       Helicobacter pylori testing. Estimated blood loss was minimal.      There is no endoscopic evidence of stenosis, stricture, mass or       diverticula in the pylorus.      The examined duodenum was normal. Impression:            - Normal esophagus.                        - A large amount of food (residue) in the stomach.                        - Gastritis. Biopsied.                        - Normal examined duodenum. Recommendation:        - Patient has a contact number available for                         emergencies. The signs and symptoms of potential                         delayed complications were discussed with the patient.                         Return to normal activities tomorrow. Written                         discharge instructions were provided to the patient.                        - Gastroparesis diet.                        - Do a gastric emptying study at appointment to be                         scheduled.                        - Await pathology results.                        - Follow up with Laquetta Plank, PA-C at [facility]. [phone].                        - Telephone GI office to schedule appointment in 1                         month.                        -  The findings and recommendations were discussed with                         the patient. Procedure Code(s):     --- Professional ---                        [phone], Esophagogastroduodenoscopy, flexible,                         transoral; with biopsy, single or multiple Diagnosis Code(s):     --- Professional ---                        R63.4, Abnormal weight loss                        R11.0, Nausea                        K29.70, Gastritis, unspecified, without bleeding CPT copyright 2022 [employer]. All rights  reserved. The codes documented in this report are preliminary and upon coder review may  be revised to meet current compliance requirements. Cassie Click MD, MD 10/10/2023 2:11:02 PM This report has been signed electronically. Number of Addenda: 0 Note Initiated On: 10/10/2023 1:53 PM Estimated Blood Loss:  Estimated blood loss was minimal. Estimated blood loss                         was minimal.      [facility]

## 2023-10-11 ENCOUNTER — Other Ambulatory Visit: Payer: Self-pay | Admitting: Gastroenterology

## 2023-10-11 ENCOUNTER — Inpatient Hospital Stay

## 2023-10-11 DIAGNOSIS — R634 Abnormal weight loss: Secondary | ICD-10-CM

## 2023-10-11 DIAGNOSIS — R198 Other specified symptoms and signs involving the digestive system and abdomen: Secondary | ICD-10-CM

## 2023-10-11 DIAGNOSIS — R112 Nausea with vomiting, unspecified: Secondary | ICD-10-CM

## 2023-10-11 LAB — SURGICAL PATHOLOGY

## 2023-10-11 NOTE — Anesthesia Postprocedure Evaluation (Signed)
 Anesthesia Post Note  Patient: Riley Martin  Procedure(s) Performed: EGD (ESOPHAGOGASTRODUODENOSCOPY)  Patient location during evaluation: Endoscopy Anesthesia Type: General Level of consciousness: awake and alert Pain management: pain level controlled Vital Signs Assessment: post-procedure vital signs reviewed and stable Respiratory status: spontaneous breathing, nonlabored ventilation, respiratory function stable and patient connected to nasal cannula oxygen Cardiovascular status: blood pressure returned to baseline and stable Postop Assessment: no apparent nausea or vomiting Anesthetic complications: no   No notable events documented.   Last Vitals:  Vitals:   10/10/23 1416 10/10/23 1426  BP: (!) 125/57 (!) 118/57  Pulse: 64 61  Resp: 10 13  Temp:    SpO2: 98% 100%    Last Pain:  Vitals:   10/10/23 1426  TempSrc:   PainSc: 0-No pain                 Lattie Poli

## 2023-10-12 ENCOUNTER — Ambulatory Visit
Admission: RE | Admit: 2023-10-12 | Discharge: 2023-10-12 | Disposition: A | Discharge status: Home / Self Care | Source: Ambulatory Visit | Attending: Gastroenterology | Admitting: Gastroenterology

## 2023-10-12 ENCOUNTER — Inpatient Hospital Stay: Attending: Oncology

## 2023-10-12 DIAGNOSIS — R5383 Other fatigue: Secondary | ICD-10-CM | POA: Insufficient documentation

## 2023-10-12 DIAGNOSIS — D472 Monoclonal gammopathy: Secondary | ICD-10-CM | POA: Insufficient documentation

## 2023-10-12 DIAGNOSIS — K5520 Angiodysplasia of colon without hemorrhage: Secondary | ICD-10-CM | POA: Insufficient documentation

## 2023-10-12 DIAGNOSIS — N400 Enlarged prostate without lower urinary tract symptoms: Secondary | ICD-10-CM | POA: Diagnosis not present

## 2023-10-12 DIAGNOSIS — R110 Nausea: Secondary | ICD-10-CM

## 2023-10-12 DIAGNOSIS — M8588 Other specified disorders of bone density and structure, other site: Secondary | ICD-10-CM | POA: Diagnosis not present

## 2023-10-12 DIAGNOSIS — D509 Iron deficiency anemia, unspecified: Secondary | ICD-10-CM | POA: Diagnosis not present

## 2023-10-12 DIAGNOSIS — I4891 Unspecified atrial fibrillation: Secondary | ICD-10-CM | POA: Insufficient documentation

## 2023-10-12 DIAGNOSIS — D631 Anemia in chronic kidney disease: Secondary | ICD-10-CM | POA: Insufficient documentation

## 2023-10-12 DIAGNOSIS — K5730 Diverticulosis of large intestine without perforation or abscess without bleeding: Secondary | ICD-10-CM | POA: Insufficient documentation

## 2023-10-12 DIAGNOSIS — R109 Unspecified abdominal pain: Secondary | ICD-10-CM | POA: Insufficient documentation

## 2023-10-12 DIAGNOSIS — E1122 Type 2 diabetes mellitus with diabetic chronic kidney disease: Secondary | ICD-10-CM | POA: Diagnosis not present

## 2023-10-12 DIAGNOSIS — Z87891 Personal history of nicotine dependence: Secondary | ICD-10-CM | POA: Insufficient documentation

## 2023-10-12 DIAGNOSIS — K429 Umbilical hernia without obstruction or gangrene: Secondary | ICD-10-CM | POA: Insufficient documentation

## 2023-10-12 DIAGNOSIS — K589 Irritable bowel syndrome without diarrhea: Secondary | ICD-10-CM | POA: Diagnosis not present

## 2023-10-12 DIAGNOSIS — R1084 Generalized abdominal pain: Secondary | ICD-10-CM | POA: Diagnosis present

## 2023-10-12 DIAGNOSIS — F32A Depression, unspecified: Secondary | ICD-10-CM | POA: Diagnosis not present

## 2023-10-12 DIAGNOSIS — R634 Abnormal weight loss: Secondary | ICD-10-CM | POA: Insufficient documentation

## 2023-10-12 DIAGNOSIS — J439 Emphysema, unspecified: Secondary | ICD-10-CM | POA: Insufficient documentation

## 2023-10-12 DIAGNOSIS — I700 Atherosclerosis of aorta: Secondary | ICD-10-CM | POA: Diagnosis not present

## 2023-10-12 DIAGNOSIS — I129 Hypertensive chronic kidney disease with stage 1 through stage 4 chronic kidney disease, or unspecified chronic kidney disease: Secondary | ICD-10-CM | POA: Diagnosis not present

## 2023-10-12 DIAGNOSIS — Z7901 Long term (current) use of anticoagulants: Secondary | ICD-10-CM | POA: Insufficient documentation

## 2023-10-12 DIAGNOSIS — Z888 Allergy status to other drugs, medicaments and biological substances status: Secondary | ICD-10-CM | POA: Insufficient documentation

## 2023-10-12 DIAGNOSIS — G894 Chronic pain syndrome: Secondary | ICD-10-CM | POA: Diagnosis not present

## 2023-10-12 DIAGNOSIS — Z85828 Personal history of other malignant neoplasm of skin: Secondary | ICD-10-CM | POA: Insufficient documentation

## 2023-10-12 DIAGNOSIS — K219 Gastro-esophageal reflux disease without esophagitis: Secondary | ICD-10-CM | POA: Insufficient documentation

## 2023-10-12 DIAGNOSIS — Z79899 Other long term (current) drug therapy: Secondary | ICD-10-CM | POA: Insufficient documentation

## 2023-10-12 DIAGNOSIS — Z885 Allergy status to narcotic agent status: Secondary | ICD-10-CM | POA: Insufficient documentation

## 2023-10-12 DIAGNOSIS — R112 Nausea with vomiting, unspecified: Secondary | ICD-10-CM

## 2023-10-12 DIAGNOSIS — D7589 Other specified diseases of blood and blood-forming organs: Secondary | ICD-10-CM | POA: Diagnosis not present

## 2023-10-12 DIAGNOSIS — I2721 Secondary pulmonary arterial hypertension: Secondary | ICD-10-CM | POA: Insufficient documentation

## 2023-10-12 DIAGNOSIS — Z8616 Personal history of COVID-19: Secondary | ICD-10-CM | POA: Insufficient documentation

## 2023-10-12 DIAGNOSIS — N1832 Chronic kidney disease, stage 3b: Secondary | ICD-10-CM | POA: Insufficient documentation

## 2023-10-12 DIAGNOSIS — Z91041 Radiographic dye allergy status: Secondary | ICD-10-CM | POA: Insufficient documentation

## 2023-10-12 LAB — COMPREHENSIVE METABOLIC PANEL WITH GFR
ALT: 12 U/L (ref 0–44)
AST: 14 U/L — ABNORMAL LOW (ref 15–41)
Albumin: 3.5 g/dL (ref 3.5–5.0)
Alkaline Phosphatase: 60 U/L (ref 38–126)
Anion gap: 9 (ref 5–15)
BUN: 34 mg/dL — ABNORMAL HIGH (ref 8–23)
CO2: 27 mmol/L (ref 22–32)
Calcium: 8.2 mg/dL — ABNORMAL LOW (ref 8.9–10.3)
Chloride: 99 mmol/L (ref 98–111)
Creatinine, Ser: 1.65 mg/dL — ABNORMAL HIGH (ref 0.61–1.24)
GFR, Estimated: 42 mL/min — ABNORMAL LOW (ref >60)
Glucose, Bld: 146 mg/dL — ABNORMAL HIGH (ref 70–99)
Potassium: 4.1 mmol/L (ref 3.5–5.1)
Sodium: 135 mmol/L (ref 135–145)
Total Bilirubin: 0.5 mg/dL (ref 0.0–1.2)
Total Protein: 6.6 g/dL (ref 6.5–8.1)

## 2023-10-12 LAB — CBC WITH DIFFERENTIAL/PLATELET
Abs Immature Granulocytes: 0.04 10*3/uL (ref 0.00–0.07)
Basophils Absolute: 0 10*3/uL (ref 0.0–0.1)
Basophils Relative: 0 %
Eosinophils Absolute: 0.2 10*3/uL (ref 0.0–0.5)
Eosinophils Relative: 3 %
HCT: 35.7 % — ABNORMAL LOW (ref 39.0–52.0)
Hemoglobin: 11.2 g/dL — ABNORMAL LOW (ref 13.0–17.0)
Immature Granulocytes: 0 %
Lymphocytes Relative: 9 %
Lymphs Abs: 0.8 10*3/uL (ref 0.7–4.0)
MCH: 32.1 pg (ref 26.0–34.0)
MCHC: 31.4 g/dL (ref 30.0–36.0)
MCV: 102.3 fL — ABNORMAL HIGH (ref 80.0–100.0)
Monocytes Absolute: 0.7 10*3/uL (ref 0.1–1.0)
Monocytes Relative: 8 %
Neutro Abs: 7.1 10*3/uL (ref 1.7–7.7)
Neutrophils Relative %: 80 %
Platelets: 209 10*3/uL (ref 150–400)
RBC: 3.49 MIL/uL — ABNORMAL LOW (ref 4.22–5.81)
RDW: 13 % (ref 11.5–15.5)
WBC: 8.9 10*3/uL (ref 4.0–10.5)
nRBC: 0 % (ref 0.0–0.2)

## 2023-10-15 LAB — KAPPA/LAMBDA LIGHT CHAINS
Kappa free light chain: 123.2 mg/L — ABNORMAL HIGH (ref 3.3–19.4)
Kappa, lambda light chain ratio: 7.08 — ABNORMAL HIGH (ref 0.26–1.65)
Lambda free light chains: 17.4 mg/L (ref 5.7–26.3)

## 2023-10-15 LAB — MULTIPLE MYELOMA PANEL, SERUM
Albumin SerPl Elph-Mcnc: 3.3 g/dL (ref 2.9–4.4)
Albumin/Glob SerPl: 1.4 (ref 0.7–1.7)
Alpha 1: 0.3 g/dL (ref 0.0–0.4)
Alpha2 Glob SerPl Elph-Mcnc: 0.8 g/dL (ref 0.4–1.0)
B-Globulin SerPl Elph-Mcnc: 1.1 g/dL (ref 0.7–1.3)
Gamma Glob SerPl Elph-Mcnc: 0.4 g/dL (ref 0.4–1.8)
Globulin, Total: 2.5 g/dL (ref 2.2–3.9)
IgA: 52 mg/dL — ABNORMAL LOW (ref 61–437)
IgG (Immunoglobin G), Serum: 465 mg/dL — ABNORMAL LOW (ref 603–1613)
IgM (Immunoglobulin M), Srm: 517 mg/dL — ABNORMAL HIGH (ref 15–143)
M Protein SerPl Elph-Mcnc: 0.3 g/dL — ABNORMAL HIGH
Total Protein ELP: 5.8 g/dL — ABNORMAL LOW (ref 6.0–8.5)

## 2023-10-18 ENCOUNTER — Inpatient Hospital Stay: Admitting: Oncology

## 2023-10-18 ENCOUNTER — Encounter: Payer: Self-pay | Admitting: Oncology

## 2023-10-18 VITALS — BP 120/55 | HR 68 | Temp 97.0°F | Resp 14 | Ht 72.0 in | Wt 132.0 lb

## 2023-10-18 DIAGNOSIS — R634 Abnormal weight loss: Secondary | ICD-10-CM

## 2023-10-18 DIAGNOSIS — N1832 Chronic kidney disease, stage 3b: Secondary | ICD-10-CM

## 2023-10-18 DIAGNOSIS — I129 Hypertensive chronic kidney disease with stage 1 through stage 4 chronic kidney disease, or unspecified chronic kidney disease: Secondary | ICD-10-CM | POA: Diagnosis not present

## 2023-10-18 DIAGNOSIS — D7589 Other specified diseases of blood and blood-forming organs: Secondary | ICD-10-CM | POA: Diagnosis not present

## 2023-10-18 DIAGNOSIS — D631 Anemia in chronic kidney disease: Secondary | ICD-10-CM

## 2023-10-18 DIAGNOSIS — D472 Monoclonal gammopathy: Secondary | ICD-10-CM | POA: Diagnosis not present

## 2023-10-18 NOTE — Assessment & Plan Note (Signed)
 With epigastric pain, nausea and decreased appetite.  CT and PET scan did not reveal any pathology explaining his symptoms. TSH was checked 10 months ago and was normal.  Recommend to repeat. Gastric emptying testing scheduled by GI.  Follow-up with gastroenterology.

## 2023-10-18 NOTE — Progress Notes (Signed)
 Patient is still not doing good at all, he has no appetite. He is having severe pain in his stomach, he rates his pain at a 7 or 8. He can't sleep at night, he is taking Pregabalin for his neuropathy and to help him sleep and it isn't working.

## 2023-10-18 NOTE — Assessment & Plan Note (Signed)
 B12 has improved, within normal limites.  Macrocytosis persist, he may have underlying bone marrow disorders.  bone marrow biopsy vs observation. Shared decision was made to continue observation.

## 2023-10-18 NOTE — Progress Notes (Signed)
 Hematology/Oncology Progress note [phone] [phone]       CHIEF COMPLAINTS/REASON FOR VISIT:  Follow up for anemia  ASSESSMENT & PLAN:   Anemia of chronic renal failure, stage 3b (HCC) #Iron  deficiency anemia, anemia secondary to chronic kidney disease. Labs reviewed and discussed with patient.   Lab Results  Component Value Date   HGB 11.2 (L) 10/12/2023   TIBC 269 07/04/2023   IRONPCTSAT 23 07/04/2023   FERRITIN 386 (H) 07/04/2023     Hemoglobin stable. Continue monitor. 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 I recommend observation.  check NT BNP, TSH -patient will like to defer additional blood testing to the next visit.    Macrocytosis B12 has improved, within normal limites.  Macrocytosis persist, he may have underlying bone marrow disorders.  bone marrow biopsy vs observation. Shared decision was made to continue observation.  Unintentional weight loss With epigastric pain, nausea and decreased appetite.  CT and PET scan did not reveal any pathology explaining his symptoms. TSH was checked 10 months ago and was normal.  Recommend to repeat. Gastric emptying testing scheduled by GI.  Follow-up with gastroenterology.   Orders Placed This Encounter  Procedures   CBC with Differential (Cancer Center Only)    Standing Status:   Future    Expected Date:   04/18/2024    Expiration Date:   07/17/2024   Iron  and TIBC    Standing Status:   Future    Expected Date:   04/18/2024    Expiration Date:   07/17/2024   Ferritin    Standing Status:   Future    Expected Date:   04/18/2024    Expiration Date:   07/17/2024   Retic Panel    Standing Status:   Future     Expected Date:   04/18/2024    Expiration Date:   07/17/2024   Multiple Myeloma Panel (SPEP&IFE w/QIG)    Standing Status:   Future    Expected Date:   04/18/2024    Expiration Date:   07/17/2024   Kappa/lambda light chains    Standing Status:   Future    Expected Date:   04/18/2024    Expiration Date:   07/17/2024   Miscellaneous [facility] test (send-out)    Standing Status:   Future    Expected Date:   04/18/2024    Expiration Date:   07/17/2024    Test name / description::   NT-pro BNP test # [id number]   Follow up in 6 months.  All questions were answered. The patient knows to call the clinic with any problems, questions or concerns.  Riley Forbes, MD, PhD [facility] Hematology Oncology 10/18/2023   HISTORY OF PRESENTING ILLNESS:  Patient has multiple medical problems,   A fib on Xarelto , hypertension, diabetes mellitus, severe COPD on 4-6 L oxygen, GERD, depression, IBS, BPH, former smoker, CKD, chronic pain syndrome etc.  Hospitalized from 05/10/21 - 05/13/21 due to dizziness/lightlessness, acute drop of hemoglobin to 5.7.  He was transfused multiple units of PRBCs during his hospitalization, and IV iron .  He underwent EGD- which is not remarkable.  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 His Xarelto  was held during admission and resumed.  He continues to feel week and was referred to hematology for further evaluation of anemia.  Accompanied by wife. Appetite is good.   Recent admission to [facility] from 07/01/2023 - 07/03/2023 due to lightheadedness/syncope.  He was recently started torsemide and spironolactone on 06/20/2023 for generalized, medications were discontinued He was also treated for COPD exacerbation.  INTERVAL HISTORY Riley Martin is a 78 y.o. male who has above history reviewed by me today presents for follow up visit for anemia.  Chronic fatigue, unchanged. Chronic respiratory failure on nighttime nasal cannula oxygen. He has lost weight  since last visit in August 2024.  Patient reports having stomach pain, decreased appetite.  Continues to have weight loss. 07/30/2023 PET scan showed 5 mm left lower lobe lung nodule.  Aortic atherosclerosis, enlarged pulmonary trunk indicated for pulmonary arterial hypertension.  Emphysema.  10/12/2023 CT abdomen pelvis with out contrast Showed no acute intra-abdominal or pelvic pathology.  5 mm nonobstructing left renal interpolar calculus.  Sigmoid diverticulosis no bowel obstruction.  Normal appendix.  Atherosclerosis and emphysema.  Patient was recently seen by gastroenterology 09/24/2023. Per patient and son, patient has had a repeat EGD recently and the results were fine.  EGD results are not available to me in EMR. gastric emptying study was ordered and is scheduled.   Review of Systems  Constitutional:  Positive for fatigue. Negative for appetite change, chills, fever and unexpected weight change.  HENT:   Negative for hearing loss and voice change.   Eyes:  Negative for eye problems and icterus.  Respiratory:  Positive for shortness of breath. Negative for chest tightness and cough.   Cardiovascular:  Negative for chest pain and leg swelling.  Gastrointestinal:  Positive for abdominal pain and nausea. Negative for abdominal distention.  Endocrine: Negative for hot flashes.  Genitourinary:  Negative for difficulty urinating, dysuria and frequency.   Musculoskeletal:  Negative for arthralgias.  Skin:  Negative for itching and rash.  Neurological:  Negative for light-headedness and numbness.  Hematological:  Negative for adenopathy. Does not bruise/bleed easily.  Psychiatric/Behavioral:  Negative for confusion.     MEDICAL HISTORY:  Past Medical History:  Diagnosis Date   A-fib [facility])    Arthritis    BPH (benign prostatic hyperplasia)    Cancer (HCC)    skin cancer   Chronic pain    COPD (chronic obstructive pulmonary disease) (HCC)    COVID    GERD (gastroesophageal reflux  disease)    Headache    History of insomnia    IBS (irritable bowel syndrome)    Iron  deficiency anemia due to chronic blood loss 07/12/2021   Orthostatic dizziness    PONV (postoperative nausea and vomiting)    Spinal stenosis    Torn rotator cuff    left    SURGICAL HISTORY: Past Surgical History:  Procedure Laterality Date   COLONOSCOPY     COLONOSCOPY WITH PROPOFOL  N/A 05/13/2021   Procedure: COLONOSCOPY WITH PROPOFOL ;  Surgeon: Luke Salaam, MD;  Location: [facility] ENDOSCOPY;  Service: Gastroenterology;  Laterality: N/A;   ESOPHAGOGASTRODUODENOSCOPY N/A 03/14/2021   Procedure: ESOPHAGOGASTRODUODENOSCOPY (EGD);  Surgeon: Quintin Buckle, DO;  Location: [facility] ENDOSCOPY;  Service: Gastroenterology;  Laterality: N/A;   ESOPHAGOGASTRODUODENOSCOPY N/A 05/13/2021   Procedure: ESOPHAGOGASTRODUODENOSCOPY (EGD);  Surgeon: Luke Salaam, MD;  Location: [facility] ENDOSCOPY;  Service: Gastroenterology;  Laterality: N/A;   ESOPHAGOGASTRODUODENOSCOPY N/A 10/10/2023   Procedure: EGD (ESOPHAGOGASTRODUODENOSCOPY);  Surgeon: Toledo, Alphonsus Jeans, MD;  Location: [facility] ENDOSCOPY;  Service: Gastroenterology;  Laterality: N/A;  Eliquis   EXCISION CHONCHA BULLOSA Bilateral 06/02/2015   Procedure: BILATERAL CHONCHA BULLOSA;  Surgeon: Rogers Clayman, MD;  Location: [facility];  Service: ENT;  Laterality: Bilateral;   HERNIA REPAIR     MAXILLARY ANTROSTOMY Bilateral 06/02/2015   Procedure: MAXILLARY ANTROSTOMY;  Surgeon: Rogers Clayman, MD;  Location: [facility];  Service: ENT;  Laterality: Bilateral;   NASAL POLYP SURGERY     NECK SURGERY  05/08/2006   no limitations   UPPER GASTROINTESTINAL ENDOSCOPY      SOCIAL HISTORY: Social History   Socioeconomic History   Marital status: Married    Spouse name: Not on file   Number of children: Not on file   Years of education: Not on file   Highest education level: Not on file  Occupational History   Not on file  Tobacco Use   Smoking status:  Former    Current packs/day: 0.00    Average packs/day: 1 pack/day for 57.0 years (57.0 ttl pk-yrs)    Types: Cigarettes    Start date: 78    Quit date: 2020    Years since quitting: 5.4   Smokeless tobacco: Never  Vaping Use   Vaping status: Never Used  Substance and Sexual Activity   Alcohol use: No   Drug use: No   Sexual activity: Not on file  Other Topics Concern   Not on file  Social History Narrative   Not on file   Social Drivers of Health   Financial Resource Strain: Low Risk  (07/12/2023)   Received from [facility]   Overall Financial Resource Strain (CARDIA)    Difficulty of Paying Living Expenses: Not hard at all  Food Insecurity: No Food Insecurity (07/12/2023)   Received from [facility]   Hunger Vital Sign    Within the past 12 months, you worried that your food would run out before you got the money to buy more.: Never true    Within the past 12 months, the food you bought just didn't last and you didn't have money to get more.: Never true  Transportation Needs: No Transportation Needs (07/12/2023)   Received from [facility] - Transportation    In the past 12 months, has lack of transportation kept you from medical appointments or from getting medications?: No    Lack of Transportation (Non-Medical): No  Physical Activity: Not on file  Stress: Not on file  Social Connections: Not on file  Intimate Partner Violence: Not on file    FAMILY HISTORY: History reviewed. No pertinent family history.  ALLERGIES:  is allergic to iodinated contrast media, lisinopril , tramadol, and pregabalin.  MEDICATIONS:  Current Outpatient Medications  Medication Sig Dispense Refill   albuterol  (PROVENTIL ) (2.5 MG/3ML) 0.083% nebulizer solution Take 3 mLs by nebulization every 6 (six) hours as needed for wheezing or shortness of breath. 75 mL 0   amiodarone  (PACERONE ) 200 MG tablet Take 1 tablet (200 mg total) by  mouth 2 (two) times daily. 60 tablet 1   apixaban  (ELIQUIS ) 2.5 MG TABS tablet Take by mouth.     blood glucose meter kit and supplies KIT Dispense based on patient and insurance preference. Use up to four times daily as directed. 1 each 0   budesonide  (PULMICORT ) 1 MG/2ML nebulizer solution Take 1 mg by nebulization daily.     Cyanocobalamin  (B-12) 2500 MCG SUBL Place 1 tablet under the tongue daily. 90 tablet 1   doxycycline  (VIBRAMYCIN ) 100 MG capsule  Take 100 mg by mouth 2 (two) times daily.     fluticasone  (FLONASE ) 50 MCG/ACT nasal spray SPRAY 2 SPRAYS INTO EACH NOSTRIL EVERY DAY 16 g 0   furosemide  (LASIX ) 20 MG tablet Take 1 tablet (20 mg total) by mouth 2 (two) times daily. Resume it after checking kidney function and after discussing with primary care doctor 30 tablet    hydrocortisone  2.5 % cream Apply topically.     Insulin  Pen Needle (PEN NEEDLES) 31G X 6 MM MISC 1 each by Does not apply route 3 (three) times daily with meals. 100 each 0   ipratropium (ATROVENT ) 0.02 % nebulizer solution Take 0.5 mg by nebulization 2 (two) times daily.     metoCLOPramide  (REGLAN ) 10 MG tablet Take 1 tablet (10 mg total) by mouth 3 (three) times daily with meals for 21 days. 63 tablet 0   metoprolol  tartrate (LOPRESSOR ) 25 MG tablet Take 25 mg by mouth daily.     mirtazapine  (REMERON ) 15 MG tablet Take 15 mg by mouth at bedtime.     mometasone -formoterol  (DULERA ) 200-5 MCG/ACT AERO Inhale 2 puffs into the lungs 2 (two) times daily. 1 each 1   Multiple Vitamin (MULTIVITAMIN WITH MINERALS) TABS tablet Take 1 tablet by mouth daily.     naloxone  (NARCAN ) nasal spray 4 mg/0.1 mL Place 0.4 mg into the nose once. CALL 911. SPR CONTENTS OF ONE SPRAYER (0.1ML) INTO ONE NOSTRIL. REPEAT IN 2-3 MIN IF SYMPTOMS OF OPIOID EMERGENCY PERSIST, ALTERNATE NOSTRILS 11/22/2021     Nutritional Supplements (FEEDING SUPPLEMENT, NEPRO CARB STEADY,) LIQD Take 237 mLs by mouth 3 (three) times daily between meals.  0   ondansetron   (ZOFRAN -ODT) 4 MG disintegrating tablet Take 1 tablet (4 mg total) by mouth every 8 (eight) hours as needed for nausea or vomiting. 30 tablet 0   oxyCODONE -acetaminophen  (PERCOCET) 10-325 MG tablet Take 1 tablet by mouth 5 (five) times daily as needed.     OXYCONTIN  10 MG 12 hr tablet Take 10 mg by mouth 3 (three) times daily.     OXYGEN Inhale 2 L into the lungs at bedtime as needed.     sodium chloride  (OCEAN) 0.65 % SOLN nasal spray Place 1 spray into both nostrils as needed for congestion. 30 mL 1   spironolactone (ALDACTONE) 25 MG tablet Take 25 mg by mouth daily.     tamsulosin  (FLOMAX ) 0.4 MG CAPS capsule Take 0.4 mg by mouth daily.     VENTOLIN  HFA 108 (90 Base) MCG/ACT inhaler Inhale 2 puffs into the lungs every 6 (six) hours as needed.     No current facility-administered medications for this visit.     PHYSICAL EXAMINATION: ECOG PERFORMANCE STATUS: 2 - Symptomatic, <50% confined to bed Vitals:   10/18/23 1059  BP: (!) 120/55  Pulse: 68  Resp: 14  Temp: (!) 97 F (36.1 C)  SpO2: 98%   Filed Weights   10/18/23 1059  Weight: 132 lb (59.9 kg)    Physical Exam Constitutional:      General: He is not in acute distress.    Comments: Frail appearance  HENT:     Head: Normocephalic and atraumatic.   Eyes:     General: No scleral icterus.   Cardiovascular:     Rate and Rhythm: Normal rate and regular rhythm.  Pulmonary:     Effort: Pulmonary effort is normal. No respiratory distress.     Breath sounds: No wheezing.     Comments: Severely decreased breath sound.  Abdominal:     General: Bowel sounds are normal. There is no distension.     Palpations: Abdomen is soft.   Musculoskeletal:        General: No deformity. Normal range of motion.     Cervical back: Normal range of motion and neck supple.   Skin:    General: Skin is warm and dry.     Findings: No erythema or rash.   Neurological:     Mental Status: He is alert and oriented to person, place, and time.  Mental status is at baseline.     Cranial Nerves: No cranial nerve deficit.     Coordination: Coordination normal.   Psychiatric:        Mood and Affect: Mood normal.     LABORATORY DATA:  I have reviewed the data as listed    Latest Ref Rng & Units 10/12/2023    2:45 PM 07/04/2023   12:00 PM 03/17/2023    6:00 PM  CBC  WBC 4.0 - 10.5 K/uL 8.9  6.3  7.8   Hemoglobin 13.0 - 17.0 g/dL 82.9  56.2  13.0   Hematocrit 39.0 - 52.0 % 35.7  32.3  35.9   Platelets 150 - 400 K/uL 209  445  298       Latest Ref Rng & Units 10/12/2023    2:45 PM 03/17/2023    6:00 PM 03/12/2023    3:41 PM  CMP  Glucose 70 - 99 mg/dL 865  87  784   BUN 8 - 23 mg/dL 34  29  27   Creatinine 0.61 - 1.24 mg/dL 6.96  2.95  2.84   Sodium 135 - 145 mmol/L 135  137  138   Potassium 3.5 - 5.1 mmol/L 4.1  3.7  4.0   Chloride 98 - 111 mmol/L 99  103  106   CO2 22 - 32 mmol/L 27  22  21    Calcium 8.9 - 10.3 mg/dL 8.2  8.6  8.8   Total Protein 6.5 - 8.1 g/dL 6.6  6.7  6.7   Total Bilirubin 0.0 - 1.2 mg/dL 0.5  0.2  0.4   Alkaline Phos 38 - 126 U/L 60  49  43   AST 15 - 41 U/L 14  15  14    ALT 0 - 44 U/L 12  13  14       Iron /TIBC/Ferritin/ %Sat    Component Value Date/Time   IRON  62 07/04/2023 1200   TIBC 269 07/04/2023 1200   FERRITIN 386 (H) 07/04/2023 1200   IRONPCTSAT 23 07/04/2023 1200      RADIOGRAPHIC STUDIES: I have personally reviewed the radiological images as listed and agreed with the findings in the report. CT ABDOMEN PELVIS WO CONTRAST Result Date: 10/12/2023 CLINICAL DATA:  Weight loss.  Abdominal pain and nausea. EXAM: CT ABDOMEN AND PELVIS WITHOUT CONTRAST TECHNIQUE: Multidetector CT imaging of the abdomen and pelvis was performed following the standard protocol without IV contrast. RADIATION DOSE REDUCTION: This exam was performed according to the departmental dose-optimization program which includes automated exposure control, adjustment of the mA and/or kV according to patient size and/or use  of iterative reconstruction technique. COMPARISON:  CT abdomen pelvis dated 03/18/2023. FINDINGS: Evaluation of this exam is limited in the absence of intravenous contrast. Lower chest: Background of emphysema. Patchy area of increased density at the right lung base may represent atelectasis. Developing infiltrate is less likely but not excluded. No intra-abdominal free air or free fluid.  Hepatobiliary: The liver is unremarkable. No biliary dilatation. The gallbladder is unremarkable. Pancreas: Unremarkable. No pancreatic ductal dilatation or surrounding inflammatory changes. Spleen: Normal in size without focal abnormality. Adrenals/Urinary Tract: The adrenal glands unremarkable. Mild bilateral renal parenchyma atrophy. There is a 5 mm nonobstructing left renal interpolar calculus. No hydronephrosis. There is no hydronephrosis or nephrolithiasis on the right. The visualized ureters appear unremarkable. There is trabeculated appearance of the bladder wall suggestive of chronic bladder outlet obstruction. Stomach/Bowel: There is sigmoid diverticulosis without active inflammatory changes. Moderate stool throughout the colon. There is no bowel obstruction or active inflammation. The appendix is normal. Vascular/Lymphatic: Moderate aortoiliac atherosclerotic disease. The IVC is unremarkable. No portal venous gas. There is no adenopathy. Reproductive: The prostate and seminal vesicles are grossly unremarkable. No pelvic mass. Other: Small fat containing umbilical hernia. Musculoskeletal: Osteopenia with degenerative changes of spine. 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 No acute osseous pathology. IMPRESSION: 1. No acute intra-abdominal or pelvic pathology. 2. A 5 mm nonobstructing left renal interpolar calculus. No hydronephrosis. 3. Sigmoid diverticulosis. No bowel obstruction. Normal appendix. 4. Aortic Atherosclerosis (ICD10-I70.0) and Emphysema (ICD10-J43.9). Electronically Signed   By: Angus Bark M.D.   On: 10/12/2023 16:24

## 2023-10-18 NOTE — Assessment & Plan Note (Addendum)
 IgG kappa MGUS Lab Results  Component Value Date   MPROTEIN 0.3 (H) 10/12/2023   KPAFRELGTCHN 123.2 (H) 10/12/2023   LAMBDASER 17.4 10/12/2023   KAPLAMBRATIO 7.08 (H) 10/12/2023  Stable M protein in the light chain ratio. I recommend observation.  check NT BNP, TSH -patient will like to defer additional blood testing to the next visit.

## 2023-10-18 NOTE — Assessment & Plan Note (Addendum)
#  Iron  deficiency anemia, anemia secondary to chronic kidney disease. Labs reviewed and discussed with patient.   Lab Results  Component Value Date   HGB 11.2 (L) 10/12/2023   TIBC 269 07/04/2023   IRONPCTSAT 23 07/04/2023   FERRITIN 386 (H) 07/04/2023     Hemoglobin stable. Continue monitor. Continue oral iron  supplementation

## 2023-10-30 ENCOUNTER — Other Ambulatory Visit: Payer: Self-pay | Admitting: Pulmonary Disease

## 2023-10-30 DIAGNOSIS — R918 Other nonspecific abnormal finding of lung field: Secondary | ICD-10-CM

## 2023-10-31 ENCOUNTER — Ambulatory Visit
Admission: RE | Admit: 2023-10-31 | Discharge: 2023-10-31 | Disposition: A | Discharge status: Home / Self Care | Source: Ambulatory Visit | Attending: Gastroenterology | Admitting: Gastroenterology

## 2023-10-31 DIAGNOSIS — R112 Nausea with vomiting, unspecified: Secondary | ICD-10-CM | POA: Diagnosis present

## 2023-10-31 DIAGNOSIS — R634 Abnormal weight loss: Secondary | ICD-10-CM | POA: Insufficient documentation

## 2023-10-31 DIAGNOSIS — R198 Other specified symptoms and signs involving the digestive system and abdomen: Secondary | ICD-10-CM | POA: Diagnosis present

## 2023-10-31 MED ORDER — TECHNETIUM TC 99M SULFUR COLLOID
1.9400 | Freq: Once | INTRAVENOUS | Status: AC | PRN
  Administered 2023-10-31: 1.94 via INTRAVENOUS

## 2023-12-05 ENCOUNTER — Other Ambulatory Visit: Payer: Self-pay

## 2023-12-05 ENCOUNTER — Inpatient Hospital Stay
Admission: EM | Admit: 2023-12-05 | Discharge: 2023-12-07 | DRG: 229 | Disposition: A | Discharge status: Home / Self Care | Source: Ambulatory Visit | Attending: Hospitalist | Admitting: Hospitalist

## 2023-12-05 DIAGNOSIS — Z79899 Other long term (current) drug therapy: Secondary | ICD-10-CM

## 2023-12-05 DIAGNOSIS — U099 Post covid-19 condition, unspecified: Secondary | ICD-10-CM | POA: Diagnosis present

## 2023-12-05 DIAGNOSIS — N1832 Chronic kidney disease, stage 3b: Secondary | ICD-10-CM | POA: Diagnosis present

## 2023-12-05 DIAGNOSIS — Z7901 Long term (current) use of anticoagulants: Secondary | ICD-10-CM

## 2023-12-05 DIAGNOSIS — E1122 Type 2 diabetes mellitus with diabetic chronic kidney disease: Secondary | ICD-10-CM | POA: Diagnosis present

## 2023-12-05 DIAGNOSIS — Z9981 Dependence on supplemental oxygen: Secondary | ICD-10-CM

## 2023-12-05 DIAGNOSIS — Z006 Encounter for examination for normal comparison and control in clinical research program: Secondary | ICD-10-CM

## 2023-12-05 DIAGNOSIS — I442 Atrioventricular block, complete: Principal | ICD-10-CM

## 2023-12-05 DIAGNOSIS — I129 Hypertensive chronic kidney disease with stage 1 through stage 4 chronic kidney disease, or unspecified chronic kidney disease: Secondary | ICD-10-CM | POA: Diagnosis present

## 2023-12-05 DIAGNOSIS — Z87891 Personal history of nicotine dependence: Secondary | ICD-10-CM

## 2023-12-05 DIAGNOSIS — Z85828 Personal history of other malignant neoplasm of skin: Secondary | ICD-10-CM

## 2023-12-05 DIAGNOSIS — J9611 Chronic respiratory failure with hypoxia: Secondary | ICD-10-CM | POA: Diagnosis present

## 2023-12-05 DIAGNOSIS — J449 Chronic obstructive pulmonary disease, unspecified: Secondary | ICD-10-CM | POA: Diagnosis present

## 2023-12-05 DIAGNOSIS — R001 Bradycardia, unspecified: Secondary | ICD-10-CM | POA: Diagnosis present

## 2023-12-05 DIAGNOSIS — I480 Paroxysmal atrial fibrillation: Secondary | ICD-10-CM | POA: Diagnosis present

## 2023-12-05 DIAGNOSIS — Z7951 Long term (current) use of inhaled steroids: Secondary | ICD-10-CM

## 2023-12-05 DIAGNOSIS — N400 Enlarged prostate without lower urinary tract symptoms: Secondary | ICD-10-CM | POA: Diagnosis present

## 2023-12-05 LAB — CBC
HCT: 33.2 % — ABNORMAL LOW (ref 39.0–52.0)
Hemoglobin: 10.5 g/dL — ABNORMAL LOW (ref 13.0–17.0)
MCH: 32 pg (ref 26.0–34.0)
MCHC: 31.6 g/dL (ref 30.0–36.0)
MCV: 101.2 fL — ABNORMAL HIGH (ref 80.0–100.0)
Platelets: 157 K/uL (ref 150–400)
RBC: 3.28 MIL/uL — ABNORMAL LOW (ref 4.22–5.81)
RDW: 14.6 % (ref 11.5–15.5)
WBC: 7.1 K/uL (ref 4.0–10.5)
nRBC: 0 % (ref 0.0–0.2)

## 2023-12-05 LAB — COMPREHENSIVE METABOLIC PANEL WITH GFR
ALT: 19 U/L (ref 0–44)
AST: 18 U/L (ref 15–41)
Albumin: 3.4 g/dL — ABNORMAL LOW (ref 3.5–5.0)
Alkaline Phosphatase: 45 U/L (ref 38–126)
Anion gap: 8 (ref 5–15)
BUN: 49 mg/dL — ABNORMAL HIGH (ref 8–23)
CO2: 28 mmol/L (ref 22–32)
Calcium: 8.2 mg/dL — ABNORMAL LOW (ref 8.9–10.3)
Chloride: 103 mmol/L (ref 98–111)
Creatinine, Ser: 1.78 mg/dL — ABNORMAL HIGH (ref 0.61–1.24)
GFR, Estimated: 38 mL/min — ABNORMAL LOW (ref >60)
Glucose, Bld: 87 mg/dL (ref 70–99)
Potassium: 4.1 mmol/L (ref 3.5–5.1)
Sodium: 139 mmol/L (ref 135–145)
Total Bilirubin: 0.5 mg/dL (ref 0.0–1.2)
Total Protein: 6.3 g/dL — ABNORMAL LOW (ref 6.5–8.1)

## 2023-12-05 LAB — GLUCOSE, CAPILLARY: Glucose-Capillary: 73 mg/dL (ref 70–99)

## 2023-12-05 LAB — MRSA NEXT GEN BY PCR, NASAL: MRSA by PCR Next Gen: NOT DETECTED

## 2023-12-05 LAB — HEMOGLOBIN A1C
Hgb A1c MFr Bld: 5.6 % (ref 4.8–5.6)
Mean Plasma Glucose: 114.02 mg/dL

## 2023-12-05 MED ORDER — CHLORHEXIDINE GLUCONATE CLOTH 2 % EX PADS
6.0000 | MEDICATED_PAD | Freq: Every day | CUTANEOUS | Status: DC
  Administered 2023-12-05 – 2023-12-07 (×3): 6 via TOPICAL

## 2023-12-05 MED ORDER — ACETAMINOPHEN 650 MG RE SUPP
650.0000 mg | Freq: Four times a day (QID) | RECTAL | Status: DC | PRN
Start: 2023-12-05 — End: 2023-12-07

## 2023-12-05 MED ORDER — ACETAMINOPHEN 325 MG PO TABS: 650.0000 mg | ORAL_TABLET | Freq: Four times a day (QID) | ORAL | Status: DC | PRN

## 2023-12-05 MED ORDER — POLYETHYLENE GLYCOL 3350 17 G PO PACK
17.0000 g | PACK | Freq: Every day | ORAL | Status: DC | PRN
Start: 2023-12-05 — End: 2023-12-07

## 2023-12-05 MED ORDER — IPRATROPIUM-ALBUTEROL 0.5-2.5 (3) MG/3ML IN SOLN: 3.0000 mL | RESPIRATORY_TRACT | Status: DC | PRN

## 2023-12-05 MED ORDER — INSULIN ASPART 100 UNIT/ML IJ SOLN: 0.0000 [IU] | Freq: Three times a day (TID) | INTRAMUSCULAR | Status: DC

## 2023-12-05 NOTE — ED Notes (Signed)
 RN attempted to call ICU to give handoff and was told to call back.

## 2023-12-05 NOTE — ED Provider Notes (Signed)
 [facility] Provider Note    Event Date/Time   First MD Initiated Contact with Patient 12/05/23 1644     (approximate)  History   Chief Complaint: Dizziness  HPI  Riley Martin is a 78 y.o. male with a past medical history of atrial fibrillation on Eliquis , COPD, gastric reflux, presents to the emergency department for low heart rate/complete heart block.  According to the patient he has been feeling short of breath and more sluggish.  Patient saw his PCP yesterday was noted to have a very low heart rate and an appointment was made today with the patient's cardiologist Dr. Wilburn.  Patient was seen in the office diagnosed with a complete heart block.  Patient was sent to the emergency department for further evaluation.  Patient is scheduled already tomorrow for a pacemaker placement.  Patient denies any chest pain.  Denies any significant symptoms as long as he is lying still in bed.  Physical Exam   Triage Vital Signs: ED Triage Vitals  Encounter Vitals Group     BP 12/05/23 1627 (!) 126/51     Girls Systolic BP Percentile --      Girls Diastolic BP Percentile --      Boys Systolic BP Percentile --      Boys Diastolic BP Percentile --      Pulse Rate 12/05/23 1627 (!) 39     Resp 12/05/23 1627 20     Temp 12/05/23 1627 98.1 F (36.7 C)     Temp Source 12/05/23 1627 Oral     SpO2 12/05/23 1627 100 %     Weight 12/05/23 1628 138 lb (62.6 kg)     Height 12/05/23 1628 6' (1.829 m)     Head Circumference --      Peak Flow --      Pain Score 12/05/23 1628 7     Pain Loc --      Pain Education --      Exclude from Growth Chart --     Most recent vital signs: Vitals:   12/05/23 1627  BP: (!) 126/51  Pulse: (!) 39  Resp: 20  Temp: 98.1 F (36.7 C)  SpO2: 100%    General: Awake, no distress.  CV:  Good peripheral perfusion.  Fairly regular rhythm however rate around 30 bpm. Resp:  Normal effort.  Equal breath sounds bilaterally.  Abd:  No  distention.  Soft, nontender.  No rebound or guarding.  ED Results / Procedures / Treatments   EKG  EKG viewed and interpreted by myself consistent with likely first-degree heart block.  P waves are marching and regular intervals but do not appear to be correlating with the QRS.  Narrow QRS, normal axis, otherwise normal intervals nonspecific ST changes.  MEDICATIONS ORDERED IN ED: Medications - No data to display   IMPRESSION / MDM / ASSESSMENT AND PLAN / ED COURSE  I reviewed the triage vital signs and the nursing notes.  Patient's presentation is most consistent with acute presentation with potential threat to life or bodily function.  Patient presents emergency department for fatigue exertion and shortness of breath found to be bradycardic and a complete heart block.  EKG today confirms complete heart block.  We will maintain the patient on a ZOLL monitor in case the patient needs external pacing which he does not currently.  Blood pressure currently reassuring 126/51 pulse rate maintaining around 35 bpm.  We will check labs will discuss with the hospitalist  for further treatment.  I spoke to the patient's cardiologist patient scheduled for a pacemaker to be placed tomorrow.  I do not appreciate any significant  Lab work shows a reassuring chemistry, overall reassuring CBC.  Patient remains on nasal monitor.  Will admit to the hospital service for further workup and treatment  CRITICAL CARE Performed by: Franky Moores   Total critical care time: 30 minutes  Critical care time was exclusive of separately billable procedures and treating other patients.  Critical care was necessary to treat or prevent imminent or life-threatening deterioration.  Critical care was time spent personally by me on the following activities: development of treatment plan with patient and/or surrogate as well as nursing, discussions with consultants, evaluation of patient's response to treatment,  examination of patient, obtaining history from patient or surrogate, ordering and performing treatments and interventions, ordering and review of laboratory studies, ordering and review of radiographic studies, pulse oximetry and re-evaluation of patient's condition.   FINAL CLINICAL IMPRESSION(S) / ED DIAGNOSES   Complete heart block   Note:  This document was prepared using Dragon voice recognition software and may include unintentional dictation errors.   Moores Franky, MD 12/05/23 [phone]

## 2023-12-05 NOTE — Progress Notes (Addendum)
 History and Physical:    Riley Martin   FMW:[id number] DOB: [birth year] DOA: 12/05/2023  Referring MD/provider: Dr. Dorothyann PCP: Alla Amis, MD   Patient coming from: Home  Chief Complaint: Complete heart block  History of Present Illness:   Riley Martin is an 78 y.o. male     ED Course:  The patient with HTN, DM 2, severe COPD, CKD, long COVID who has been feeling weak for the past 2 years due to long COVID and has COPD.  Over the past 4 weeks he has been feeling more tired than usual but 3 nights ago he felt so tired that he could not move around at all.  His wife noted his heart rate was 43.  He went to see his PCP who noted that he was bradycardic, was referred to cardiology who noted that he was in complete heart block and patient is being admitted for pacemaker placement tomorrow.  ROS:   ROS   Review of Systems: General: Denies fever, chills, malaise,  Eyes: Denies recent change in vision, no discharge, redness, pain noted Cardiovascular: Denies chest pain or palpitations GI: Denies nausea, vomiting, diarrhea or constipation GU: Denies dysuria, frequency or hematuria Blood/lymphatics: Denies easy bruising or bleeding Mood/affect: Denies anxiety/depression    Past Medical History:   Past Medical History:  Diagnosis Date   A-fib (HCC)    Arthritis    BPH (benign prostatic hyperplasia)    Cancer (HCC)    skin cancer   Chronic pain    COPD (chronic obstructive pulmonary disease) (HCC)    COVID    GERD (gastroesophageal reflux disease)    Headache    History of insomnia    IBS (irritable bowel syndrome)    Iron  deficiency anemia due to chronic blood loss 07/12/2021   Orthostatic dizziness    PONV (postoperative nausea and vomiting)    Spinal stenosis    Torn rotator cuff    left    Past Surgical History:   Past Surgical History:  Procedure Laterality Date   COLONOSCOPY     COLONOSCOPY WITH PROPOFOL  N/A 05/13/2021   Procedure:  COLONOSCOPY WITH PROPOFOL ;  Surgeon: Therisa Bi, MD;  Location: [facility] ENDOSCOPY;  Service: Gastroenterology;  Laterality: N/A;   ESOPHAGOGASTRODUODENOSCOPY N/A 03/14/2021   Procedure: ESOPHAGOGASTRODUODENOSCOPY (EGD);  Surgeon: Onita Elspeth Sharper, DO;  Location: [facility] ENDOSCOPY;  Service: Gastroenterology;  Laterality: N/A;   ESOPHAGOGASTRODUODENOSCOPY N/A 05/13/2021   Procedure: ESOPHAGOGASTRODUODENOSCOPY (EGD);  Surgeon: Therisa Bi, MD;  Location: [facility] ENDOSCOPY;  Service: Gastroenterology;  Laterality: N/A;   ESOPHAGOGASTRODUODENOSCOPY N/A 10/10/2023   Procedure: EGD (ESOPHAGOGASTRODUODENOSCOPY);  Surgeon: Toledo, Ladell POUR, MD;  Location: [facility] ENDOSCOPY;  Service: Gastroenterology;  Laterality: N/A;  Eliquis    EXCISION CHONCHA BULLOSA Bilateral 06/02/2015   Procedure: BILATERAL CHONCHA BULLOSA;  Surgeon: Carolee Hunter, MD;  Location: [facility];  Service: ENT;  Laterality: Bilateral;   HERNIA REPAIR     MAXILLARY ANTROSTOMY Bilateral 06/02/2015   Procedure: MAXILLARY ANTROSTOMY;  Surgeon: Carolee Hunter, MD;  Location: [facility];  Service: ENT;  Laterality: Bilateral;   NASAL POLYP SURGERY     NECK SURGERY  05/08/2006   no limitations   UPPER GASTROINTESTINAL ENDOSCOPY      Social History:   Social History   Socioeconomic History   Marital status: Married    Spouse name: Not on file   Number of children: Not on file   Years of education: Not on file   Highest education level: Not on  file  Occupational History   Not on file  Tobacco Use   Smoking status: Former    Current packs/day: 0.00    Average packs/day: 1 pack/day for 57.0 years (57.0 ttl pk-yrs)    Types: Cigarettes    Start date: 57    Quit date: 2020    Years since quitting: 5.5   Smokeless tobacco: Never  Vaping Use   Vaping status: Never Used  Substance and Sexual Activity   Alcohol use: No   Drug use: No   Sexual activity: Not on file  Other Topics Concern   Not on file  Social  History Narrative   Not on file   Social Drivers of Health   Financial Resource Strain: Low Risk  (07/12/2023)   Received from [facility]   Overall Financial Resource Strain (CARDIA)    Difficulty of Paying Living Expenses: Not hard at all  Food Insecurity: No Food Insecurity (07/12/2023)   Received from [facility]   Hunger Vital Sign    Within the past 12 months, you worried that your food would run out before you got the money to buy more.: Never true    Within the past 12 months, the food you bought just didn't last and you didn't have money to get more.: Never true  Transportation Needs: No Transportation Needs (07/12/2023)   [facility]    In the past 12 months, has lack of transportation kept you from medical appointments or from getting medications?: No    Lack of Transportation (Non-Medical): No  Physical Activity: Not on file  Stress: Not on file  Social Connections: Not on file  Intimate Partner Violence: Not on file    Allergies   Iodinated contrast media, Lisinopril , Tramadol, and Pregabalin  Family history:   History reviewed. No pertinent family history.  Current Medications:   Prior to Admission medications   Medication Sig Start Date End Date Taking? Authorizing Provider  albuterol  (PROVENTIL ) (2.5 MG/3ML) 0.083% nebulizer solution Take 3 mLs by nebulization every 6 (six) hours as needed for wheezing or shortness of breath. 10/08/22 10/18/23  Viviann Pastor, MD  amiodarone  (PACERONE ) 200 MG tablet Take 1 tablet (200 mg total) by mouth 2 (two) times daily. 12/29/20   Fausto Burnard LABOR, DO  apixaban  (ELIQUIS ) 2.5 MG TABS tablet Take by mouth. 06/02/21   [provider]  blood glucose meter kit and supplies KIT Dispense based on patient and insurance preference. Use up to four times daily as directed. 12/29/20   Fausto Burnard A, DO  budesonide  (PULMICORT ) 1 MG/2ML  nebulizer solution Take 1 mg by nebulization daily.    [provider]  Cyanocobalamin  (B-12) 2500 MCG SUBL Place 1 tablet under the tongue daily. 10/12/21   Babara Call, MD  doxycycline  (VIBRAMYCIN ) 100 MG capsule Take 100 mg by mouth 2 (two) times daily. 06/19/23   [provider]  fluticasone  (FLONASE ) 50 MCG/ACT nasal spray SPRAY 2 SPRAYS INTO EACH NOSTRIL EVERY DAY 02/07/17   Verdia Art, MD  furosemide  (LASIX ) 20 MG tablet Take 1 tablet (20 mg total) by mouth 2 (two) times daily. Resume it after checking kidney function and after discussing with primary care doctor 05/18/21   Christobal Guadalajara, MD  hydrocortisone  2.5 % cream Apply topically. 06/29/21   [provider]  Insulin  Pen Needle (PEN NEEDLES) 31G X 6 MM MISC 1 each by Does not apply route 3 (three) times  daily with meals. 12/29/20   Fausto Sor A, DO  ipratropium (ATROVENT ) 0.02 % nebulizer solution Take 0.5 mg by nebulization 2 (two) times daily.    [provider]  metoCLOPramide  (REGLAN ) 10 MG tablet Take 1 tablet (10 mg total) by mouth 3 (three) times daily with meals for 21 days. 03/12/23 10/18/23  Malvina Alm DASEN, MD  metoprolol  tartrate (LOPRESSOR ) 25 MG tablet Take 25 mg by mouth daily. 05/25/21   [provider]  mirtazapine  (REMERON ) 15 MG tablet Take 15 mg by mouth at bedtime. 12/14/20   [provider]  mometasone -formoterol  (DULERA ) 200-5 MCG/ACT AERO Inhale 2 puffs into the lungs 2 (two) times daily. 12/29/20   Fausto Sor LABOR, DO  Multiple Vitamin (MULTIVITAMIN WITH MINERALS) TABS tablet Take 1 tablet by mouth daily. 12/30/20   Fausto Sor A, DO  naloxone  (NARCAN ) nasal spray 4 mg/0.1 mL Place 0.4 mg into the nose once. CALL 911. SPR CONTENTS OF ONE SPRAYER (0.1ML) INTO ONE NOSTRIL. REPEAT IN 2-3 MIN IF SYMPTOMS OF OPIOID EMERGENCY PERSIST, ALTERNATE NOSTRILS 11/22/2021 11/22/21   [provider]  Nutritional Supplements (FEEDING SUPPLEMENT, NEPRO CARB STEADY,) LIQD  Take 237 mLs by mouth 3 (three) times daily between meals. 12/29/20   Fausto Sor LABOR, DO  ondansetron  (ZOFRAN -ODT) 4 MG disintegrating tablet Take 1 tablet (4 mg total) by mouth every 8 (eight) hours as needed for nausea or vomiting. 07/04/23   Babara Call, MD  oxyCODONE -acetaminophen  (PERCOCET) 10-325 MG tablet Take 1 tablet by mouth 5 (five) times daily as needed. 06/24/21   [provider]  OXYCONTIN  10 MG 12 hr tablet Take 10 mg by mouth 3 (three) times daily. 12/22/20   [provider]  OXYGEN Inhale 2 L into the lungs at bedtime as needed.    [provider]  sodium chloride  (OCEAN) 0.65 % SOLN nasal spray Place 1 spray into both nostrils as needed for congestion. 12/29/20   Fausto Sor LABOR, DO  spironolactone (ALDACTONE) 25 MG tablet Take 25 mg by mouth daily. 05/03/21   [provider]  tamsulosin  (FLOMAX ) 0.4 MG CAPS capsule Take 0.4 mg by mouth daily. 09/03/16   [provider]  VENTOLIN  HFA 108 (90 Base) MCG/ACT inhaler Inhale 2 puffs into the lungs every 6 (six) hours as needed. 08/07/16   [provider]    Physical Exam:   Vitals:   12/05/23 1627 12/05/23 1628 12/05/23 1745  BP: (!) 126/51  (!) 124/58  Pulse: (!) 39  (!) 38  Resp: 20  15  Temp: 98.1 F (36.7 C)    TempSrc: Oral    SpO2: 100%  100%  Weight:  62.6 kg   Height:  6' (1.829 m)      Physical Exam: Blood pressure (!) 124/58, pulse (!) 38, temperature 98.1 F (36.7 C), temperature source Oral, resp. rate 15, height 6' (1.829 m), weight 62.6 kg, SpO2 100%. Gen: Extremely tired appearing man lying in bed in no respiratory distress but barely able to keep his eyes open.  He does speak coherently and normally when spoken to. Eyes: sclera anicteric, conjuctiva mildly injected bilaterally CVS: Extremely bradycardic, systolic murmur throughout precordium Respiratory:  decreased air entry bilaterally with no wheezes or rhonchi GI: NABS, soft, NT  LE: No edema. No  cyanosis Neuro: A/O x 3, grossly nonfocal.  Psych: patient is logical and coherent, judgement and insight appear normal, mood and affect appropriate to situation.   Data Review:    Labs: Basic Metabolic Panel:  Recent Labs  Lab 12/05/23 1636  NA 139  K 4.1  CL 103  CO2 28  GLUCOSE 87  BUN 49*  CREATININE 1.78*  CALCIUM 8.2*   Liver Function Tests: Recent Labs  Lab 12/05/23 1636  AST 18  ALT 19  ALKPHOS 45  BILITOT 0.5  PROT 6.3*  ALBUMIN  3.4*   No results for input(s): LIPASE, AMYLASE in the last 168 hours. No results for input(s): AMMONIA in the last 168 hours. CBC: Recent Labs  Lab 12/05/23 1636  WBC 7.1  HGB 10.5*  HCT 33.2*  MCV 101.2*  PLT 157   Cardiac Enzymes: No results for input(s): CKTOTAL, CKMB, CKMBINDEX, TROPONINI in the last 168 hours.  BNP (last 3 results) No results for input(s): PROBNP in the last 8760 hours. CBG: No results for input(s): GLUCAP in the last 168 hours.  Urinalysis    Component Value Date/Time   COLORURINE YELLOW (A) 03/17/2023 2256   APPEARANCEUR CLEAR (A) 03/17/2023 2256   LABSPEC 1.012 03/17/2023 2256   PHURINE 5.0 03/17/2023 2256   GLUCOSEU NEGATIVE 03/17/2023 2256   HGBUR NEGATIVE 03/17/2023 2256   BILIRUBINUR NEGATIVE 03/17/2023 2256   KETONESUR 5 (A) 03/17/2023 2256   PROTEINUR NEGATIVE 03/17/2023 2256   NITRITE NEGATIVE 03/17/2023 2256   LEUKOCYTESUR NEGATIVE 03/17/2023 2256      Radiographic Studies: No results found.  EKG: Independently reviewed.  Bradycardia with junctional escape at 39.  RBBB.   Assessment/Plan:   Principal Problem:   Complete heart block (HCC)   Complete heart block Will admit to stepdown for close monitoring External pacemaker in place, does not need pacing at present given stable BP. Patient is scheduled for leadless pacemaker placement tomorrow per cardiology N.p.o. after midnight Hold Eliquis   Severe COPD Patient was comfortable from  respiratory status, no wheezes on exam Would restart inhaled bronchodilators once medications have been reconciled Oxygen and as needed DuoNebs ordered  DM 2 Unclear if patient is on any antidiabetic medications--patient stated he does not have diabetes Await medicine reconciliation CBD 3 times daily AC requested, insulin  is not ordered and can be ordered if warranted N.p.o. after midnight  CKD 3B Creatinine is at baseline  Long COVID Patient states that he has significant fatigue at baseline for the past 2 years since he was diagnosed with COVID     Other information:   DVT prophylaxis: SCD ordered. Code Status: Full Family Communication: Wife was at bedside throughout Disposition Plan: Home Consults called: Cardiology Admission status: Observation  Kandas Oliveto Tublu Lalah Durango [facility]  If 7PM-7AM, please contact night-coverage [email or web address]

## 2023-12-05 NOTE — ED Triage Notes (Signed)
 Patient sent over from [facility] for dizziness for one week; KC reports patient is brady in the 40's.

## 2023-12-06 ENCOUNTER — Encounter
Admission: EM | Disposition: A | Discharge status: Home / Self Care | Payer: Self-pay | Source: Ambulatory Visit | Attending: Emergency Medicine

## 2023-12-06 DIAGNOSIS — I442 Atrioventricular block, complete: Secondary | ICD-10-CM | POA: Diagnosis not present

## 2023-12-06 HISTORY — PX: PACEMAKER LEADLESS INSERTION: EP1219

## 2023-12-06 LAB — CBC
HCT: 30.5 % — ABNORMAL LOW (ref 39.0–52.0)
Hemoglobin: 9.7 g/dL — ABNORMAL LOW (ref 13.0–17.0)
MCH: 31.7 pg (ref 26.0–34.0)
MCHC: 31.8 g/dL (ref 30.0–36.0)
MCV: 99.7 fL (ref 80.0–100.0)
Platelets: 134 K/uL — ABNORMAL LOW (ref 150–400)
RBC: 3.06 MIL/uL — ABNORMAL LOW (ref 4.22–5.81)
RDW: 14.6 % (ref 11.5–15.5)
WBC: 5.6 K/uL (ref 4.0–10.5)
nRBC: 0 % (ref 0.0–0.2)

## 2023-12-06 LAB — GLUCOSE, CAPILLARY
Glucose-Capillary: 76 mg/dL (ref 70–99)
Glucose-Capillary: 70 mg/dL (ref 70–99)
Glucose-Capillary: 82 mg/dL (ref 70–99)

## 2023-12-06 LAB — BASIC METABOLIC PANEL WITH GFR
Anion gap: 8 (ref 5–15)
BUN: 44 mg/dL — ABNORMAL HIGH (ref 8–23)
CO2: 27 mmol/L (ref 22–32)
Calcium: 7.9 mg/dL — ABNORMAL LOW (ref 8.9–10.3)
Chloride: 105 mmol/L (ref 98–111)
Creatinine, Ser: 1.72 mg/dL — ABNORMAL HIGH (ref 0.61–1.24)
GFR, Estimated: 40 mL/min — ABNORMAL LOW (ref >60)
Glucose, Bld: 101 mg/dL — ABNORMAL HIGH (ref 70–99)
Potassium: 4 mmol/L (ref 3.5–5.1)
Sodium: 140 mmol/L (ref 135–145)

## 2023-12-06 LAB — TSH: TSH: 3.332 u[IU]/mL (ref 0.350–4.500)

## 2023-12-06 SURGERY — PACEMAKER LEADLESS INSERTION
Anesthesia: Moderate Sedation

## 2023-12-06 MED ORDER — FENTANYL CITRATE (PF) 100 MCG/2ML IJ SOLN
INTRAMUSCULAR | Status: DC | PRN
  Administered 2023-12-06 (×2): 12.5 ug via INTRAVENOUS

## 2023-12-06 MED ORDER — MIDAZOLAM HCL 2 MG/2ML IJ SOLN
INTRAMUSCULAR | Status: AC
  Filled 2023-12-06: qty 2

## 2023-12-06 MED ORDER — CEFAZOLIN SODIUM-DEXTROSE 2-4 GM/100ML-% IV SOLN: 2.0000 g | INTRAVENOUS | Status: DC

## 2023-12-06 MED ORDER — OXYCODONE-ACETAMINOPHEN 10-325 MG PO TABS: 1.0000 | ORAL_TABLET | Freq: Every day | ORAL | Status: DC | PRN

## 2023-12-06 MED ORDER — SODIUM CHLORIDE 0.9 % IV SOLN
80.0000 mg | INTRAVENOUS | Status: DC
  Filled 2023-12-06 (×2): qty 2

## 2023-12-06 MED ORDER — FENTANYL CITRATE (PF) 100 MCG/2ML IJ SOLN
INTRAMUSCULAR | Status: AC
  Filled 2023-12-06: qty 2

## 2023-12-06 MED ORDER — IOHEXOL 300 MG/ML  SOLN
INTRAMUSCULAR | Status: DC | PRN
  Administered 2023-12-06: 20 mL

## 2023-12-06 MED ORDER — LIDOCAINE HCL 1 % IJ SOLN
INTRAMUSCULAR | Status: AC
  Filled 2023-12-06: qty 40

## 2023-12-06 MED ORDER — SODIUM CHLORIDE 0.9 % IV SOLN: 250.0000 mL | INTRAVENOUS | Status: DC | PRN

## 2023-12-06 MED ORDER — ONDANSETRON HCL 4 MG/2ML IJ SOLN
4.0000 mg | Freq: Four times a day (QID) | INTRAMUSCULAR | Status: DC | PRN
  Administered 2023-12-06 – 2023-12-07 (×2): 4 mg via INTRAVENOUS
  Filled 2023-12-06 (×2): qty 2

## 2023-12-06 MED ORDER — SODIUM CHLORIDE 0.9 % IV SOLN: INTRAVENOUS | Status: DC

## 2023-12-06 MED ORDER — SODIUM CHLORIDE 0.9% FLUSH
3.0000 mL | Freq: Two times a day (BID) | INTRAVENOUS | Status: DC
  Administered 2023-12-06 – 2023-12-07 (×2): 3 mL via INTRAVENOUS

## 2023-12-06 MED ORDER — HEPARIN SODIUM (PORCINE) 1000 UNIT/ML IJ SOLN
INTRAMUSCULAR | Status: AC
  Filled 2023-12-06: qty 10

## 2023-12-06 MED ORDER — SODIUM CHLORIDE 0.9% FLUSH: 3.0000 mL | INTRAVENOUS | Status: DC | PRN

## 2023-12-06 MED ORDER — SODIUM CHLORIDE 0.9 % IV SOLN
80.0000 mg | Freq: Once | INTRAVENOUS | Status: DC
  Filled 2023-12-06: qty 2

## 2023-12-06 MED ORDER — MIDAZOLAM HCL 2 MG/2ML IJ SOLN
INTRAMUSCULAR | Status: DC | PRN
  Administered 2023-12-06 (×2): .5 mg via INTRAVENOUS

## 2023-12-06 MED ORDER — SODIUM CHLORIDE 0.9 % IV BOLUS
INTRAVENOUS | Status: AC | PRN
  Administered 2023-12-06: 250 mL via INTRAVENOUS

## 2023-12-06 MED ORDER — HEPARIN (PORCINE) IN NACL 2000-0.9 UNIT/L-% IV SOLN
INTRAVENOUS | Status: DC | PRN
  Administered 2023-12-06: 1000 mL

## 2023-12-06 MED ORDER — SIMETHICONE 80 MG PO CHEW
80.0000 mg | CHEWABLE_TABLET | Freq: Once | ORAL | Status: AC
  Administered 2023-12-06: 80 mg via ORAL
  Filled 2023-12-06: qty 1

## 2023-12-06 MED ORDER — OXYCODONE-ACETAMINOPHEN 5-325 MG PO TABS
1.0000 | ORAL_TABLET | Freq: Every day | ORAL | Status: DC | PRN
  Administered 2023-12-06 – 2023-12-07 (×2): 1 via ORAL
  Filled 2023-12-06 (×2): qty 1

## 2023-12-06 MED ORDER — METHYLPREDNISOLONE SODIUM SUCC 125 MG IJ SOLR
INTRAMUSCULAR | Status: DC | PRN
  Administered 2023-12-06: 125 mg via INTRAVENOUS

## 2023-12-06 MED ORDER — OXYCODONE HCL 5 MG PO TABS
5.0000 mg | ORAL_TABLET | Freq: Every day | ORAL | Status: DC | PRN
  Administered 2023-12-06: 5 mg via ORAL
  Filled 2023-12-06 (×2): qty 1

## 2023-12-06 MED ORDER — DIPHENHYDRAMINE HCL 50 MG/ML IJ SOLN
INTRAMUSCULAR | Status: AC
  Filled 2023-12-06: qty 1

## 2023-12-06 MED ORDER — OXYCODONE HCL ER 10 MG PO T12A
10.0000 mg | EXTENDED_RELEASE_TABLET | Freq: Three times a day (TID) | ORAL | Status: DC
  Administered 2023-12-06 – 2023-12-07 (×2): 10 mg via ORAL
  Filled 2023-12-06 (×2): qty 1

## 2023-12-06 MED ORDER — DIPHENHYDRAMINE HCL 50 MG/ML IJ SOLN
INTRAMUSCULAR | Status: DC | PRN
Start: 2023-12-06 — End: 2023-12-06
  Administered 2023-12-06: 25 mg via INTRAVENOUS

## 2023-12-06 MED ORDER — ACETAMINOPHEN 325 MG PO TABS: 650.0000 mg | ORAL_TABLET | ORAL | Status: DC | PRN

## 2023-12-06 MED ORDER — HEPARIN (PORCINE) IN NACL 1000-0.9 UT/500ML-% IV SOLN
INTRAVENOUS | Status: AC
  Filled 2023-12-06: qty 1000

## 2023-12-06 MED ORDER — HEPARIN SODIUM (PORCINE) 1000 UNIT/ML IJ SOLN
INTRAMUSCULAR | Status: DC | PRN
  Administered 2023-12-06: 5000 [IU] via INTRAVENOUS

## 2023-12-06 MED ORDER — METHYLPREDNISOLONE SODIUM SUCC 125 MG IJ SOLR
INTRAMUSCULAR | Status: AC
  Filled 2023-12-06: qty 2

## 2023-12-06 SURGICAL SUPPLY — 15 items
CATH INFINITI JR4 5F (CATHETERS) IMPLANT
DILATOR VESSEL 38 20CM 12FR (INTRODUCER) IMPLANT
DILATOR VESSEL 38 20CM 14FR (INTRODUCER) IMPLANT
DILATOR VESSEL 38 20CM 18FR (INTRODUCER) IMPLANT
DILATOR VESSEL 38 20CM 8FR (INTRODUCER) IMPLANT
NDL PERC 18GX7CM (NEEDLE) IMPLANT
NEEDLE PERC 18GX7CM (NEEDLE) ×1 IMPLANT
PACEMAKER LEADLESS AV2 MICRA (Pacemaker) IMPLANT
PACK CARDIAC CATH (CUSTOM PROCEDURE TRAY) ×1 IMPLANT
PAD ELECT DEFIB RADIOL ZOLL (MISCELLANEOUS) IMPLANT
SHEATH AVANTI 7FRX11 (SHEATH) IMPLANT
STATION PROTECTION PRESSURIZED (MISCELLANEOUS) IMPLANT
SUT SILK 0 FSL (SUTURE) IMPLANT
WIRE AMPLATZ SS-J .035X180CM (WIRE) IMPLANT
WIRE HITORQ VERSACORE ST 145CM (WIRE) IMPLANT

## 2023-12-06 NOTE — Care Management Obs Status (Signed)
 MEDICARE OBSERVATION STATUS NOTIFICATION   Patient Details  Name: Riley Martin MRN: [id number] Date of Birth: [birth year]   Medicare Observation Status Notification Given:  Yes    [name] 12/06/2023, 1:59 PM

## 2023-12-06 NOTE — Progress Notes (Signed)
  PROGRESS NOTE    Riley Martin  FMW:[id number] DOB: 06-28-1945 DOA: 12/05/2023 PCP: Alla Amis, MD  AR[facility]/NONE  LOS: 0 days   Brief hospital course:   Assessment & Plan: The patient with HTN, DM 2, severe COPD, CKD, long COVID who has been feeling weak for the past 2 years due to long COVID and has COPD.  Over the past 4 weeks he has been feeling more tired than usual but 3 nights ago he felt so tired that he could not move around at all.  His wife noted his heart rate was 43.  He went to see his PCP who noted that he was bradycardic, was referred to cardiology who noted that he was in complete heart block and patient is being admitted for pacemaker placement    Complete heart block --pacemaker placement today   COPD Chronic hypoxemic respiratory failure --stable  DM 2, ruled out --A1c has been 5.6. --no need for BG checks.   CKD 3B Creatinine is at baseline   Long COVID Patient states that he has significant fatigue at baseline for the past 2 years since he was diagnosed with COVID    DVT prophylaxis: SCD/Compression stockings Code Status: Full code  Family Communication: family updated at bedside today Level of care: Stepdown Dispo:   The patient is from: home Anticipated d/c is to: home Anticipated d/c date is: tomorrow   Subjective and Interval History:  No new complaint.   Going for pacemaker placement today.   Objective: Vitals:   12/06/23 1400 12/06/23 1502 12/06/23 1521 12/06/23 1658  BP: (!) 130/48 (!) 147/49 (!) 146/47   Pulse: (!) 37 (!) 52 (!) 40   Resp: 12 12 12    Temp:   98.4 F (36.9 C)   TempSrc:   Oral   SpO2: 98% 100% 98% 98%  Weight:      Height:        Intake/Output Summary (Last 24 hours) at 12/06/2023 1757 Last data filed at 12/06/2023 0700 Gross per 24 hour  Intake --  Output 1175 ml  Net -1175 ml   Filed Weights   12/05/23 1628  Weight: 62.6 kg    Examination:   Constitutional: NAD, alert, oriented HEENT:  conjunctivae and lids normal, EOMI CV: No cyanosis.   RESP: normal respiratory effort Neuro: II - XII grossly intact.   Psych: Normal mood and affect.  Appropriate judgement and reason   Data Reviewed: I have personally reviewed labs and imaging studies  Time spent: 35 minutes  Ellouise Haber, MD [facility] If 7PM-7AM, please contact night-coverage 12/06/2023, 5:57 PM

## 2023-12-06 NOTE — Plan of Care (Signed)
  Problem: Education: Goal: Ability to describe self-care measures that may prevent or decrease complications (Diabetes Survival Skills Education) will improve Outcome: Progressing Goal: Individualized Educational Video(s) Outcome: Progressing   Problem: Coping: Goal: Ability to adjust to condition or change in health will improve Outcome: Progressing   Problem: Fluid Volume: Goal: Ability to maintain a balanced intake and output will improve Outcome: Progressing   Problem: Health Behavior/Discharge Planning: Goal: Ability to identify and utilize available resources and services will improve Outcome: Progressing   Problem: Metabolic: Goal: Ability to maintain appropriate glucose levels will improve Outcome: Progressing   Problem: Skin Integrity: Goal: Risk for impaired skin integrity will decrease Outcome: Progressing   Problem: Education: Goal: Knowledge of General Education information will improve Description: Including pain rating scale, medication(s)/side effects and non-pharmacologic comfort measures Outcome: Progressing   Problem: Activity: Goal: Risk for activity intolerance will decrease Outcome: Progressing

## 2023-12-06 NOTE — Consult Note (Signed)
 [facility] CARDIOLOGY CONSULT NOTE       Patient ID: Riley Martin MRN: [id number] DOB/AGE: 06-12-1945 78 y.o.  Admit date: 12/05/2023 Referring Physician Dr. Dana Primary Physician Alla Amis, MD Primary Cardiologist Dr. Wilburn Reason for Consultation complete heart block  HPI: Riley Martin is a 78 y.o. male  with a past medical history of paroxsymal atrial fibrillation (on Eliquis , amio), severe COPD (on home O2), CKD, hypertension who presented to the ED on 12/05/2023 after sent by outpatient cardiology as patient found to be in complete heart block and admitted to have leadless pacemaker implantation today. Patient states he's been significantly more fatigued over the past 3 days and dizziness. Cardiology was consulted for further evaluation.   Work up in the ED notable for Na 139, K 4.1, Cr 1.78, GFR 38, Hgb 10.5, plts 157, A1C 5.6. EKG in ED with high grade AV block. Patient's Eliquis  and amio held due to upcoming leadless pacemaker implantation today.   At the time of my evaluation this morning, patient was resting comfortably in hospital bed. We discussed patients sxs in further detail. Patient states he's been feeling more tired and dizzy especially for the last few days. Found to have HR in 40s. Went to see his outpatient primary cardiologist and found to be in complete heart block and sent to ED to have PPM. Patient denies any chest pain, palpitations or swelling.   Review of systems complete and found to be negative unless listed above    Past Medical History:  Diagnosis Date   A-fib (HCC)    Arthritis    BPH (benign prostatic hyperplasia)    Cancer (HCC)    skin cancer   Chronic pain    COPD (chronic obstructive pulmonary disease) (HCC)    COVID    GERD (gastroesophageal reflux disease)    Headache    History of insomnia    IBS (irritable bowel syndrome)    Iron  deficiency anemia due to chronic blood loss 07/12/2021   Orthostatic  dizziness    PONV (postoperative nausea and vomiting)    Spinal stenosis    Torn rotator cuff    left    Past Surgical History:  Procedure Laterality Date   COLONOSCOPY     COLONOSCOPY WITH PROPOFOL  N/A 05/13/2021   Procedure: COLONOSCOPY WITH PROPOFOL ;  Surgeon: Therisa Bi, MD;  Location: [facility] ENDOSCOPY;  Service: Gastroenterology;  Laterality: N/A;   ESOPHAGOGASTRODUODENOSCOPY N/A 03/14/2021   Procedure: ESOPHAGOGASTRODUODENOSCOPY (EGD);  Surgeon: Onita Elspeth Sharper, DO;  Location: [facility] ENDOSCOPY;  Service: Gastroenterology;  Laterality: N/A;   ESOPHAGOGASTRODUODENOSCOPY N/A 05/13/2021   Procedure: ESOPHAGOGASTRODUODENOSCOPY (EGD);  Surgeon: Therisa Bi, MD;  Location: [facility] ENDOSCOPY;  Service: Gastroenterology;  Laterality: N/A;   ESOPHAGOGASTRODUODENOSCOPY N/A 10/10/2023   Procedure: EGD (ESOPHAGOGASTRODUODENOSCOPY);  Surgeon: Toledo, Ladell POUR, MD;  Location: [facility] ENDOSCOPY;  Service: Gastroenterology;  Laterality: N/A;  Eliquis    EXCISION CHONCHA BULLOSA Bilateral 06/02/2015   Procedure: BILATERAL CHONCHA BULLOSA;  Surgeon: Carolee Hunter, MD;  Location: [facility];  Service: ENT;  Laterality: Bilateral;   HERNIA REPAIR     MAXILLARY ANTROSTOMY Bilateral 06/02/2015   Procedure: MAXILLARY ANTROSTOMY;  Surgeon: Carolee Hunter, MD;  Location: [facility];  Service: ENT;  Laterality: Bilateral;   NASAL POLYP SURGERY     NECK SURGERY  05/08/2006   no limitations   UPPER GASTROINTESTINAL ENDOSCOPY      Medications Prior to Admission  Medication Sig Dispense Refill Last Dose/Taking   albuterol  (PROVENTIL ) (2.5 MG/3ML) 0.083%  nebulizer solution Take 3 mLs by nebulization every 6 (six) hours as needed for wheezing or shortness of breath. 75 mL 0    amiodarone  (PACERONE ) 200 MG tablet Take 1 tablet (200 mg total) by mouth 2 (two) times daily. 60 tablet 1    apixaban  (ELIQUIS ) 2.5 MG TABS tablet Take by mouth.      blood glucose meter kit and supplies KIT Dispense based  on patient and insurance preference. Use up to four times daily as directed. 1 each 0    budesonide  (PULMICORT ) 1 MG/2ML nebulizer solution Take 1 mg by nebulization daily.      Cyanocobalamin  (B-12) 2500 MCG SUBL Place 1 tablet under the tongue daily. 90 tablet 1    doxycycline  (VIBRAMYCIN ) 100 MG capsule Take 100 mg by mouth 2 (two) times daily.      fluticasone  (FLONASE ) 50 MCG/ACT nasal spray SPRAY 2 SPRAYS INTO EACH NOSTRIL EVERY DAY 16 g 0    furosemide  (LASIX ) 20 MG tablet Take 1 tablet (20 mg total) by mouth 2 (two) times daily. Resume it after checking kidney function and after discussing with primary care doctor 30 tablet     hydrocortisone  2.5 % cream Apply topically.      Insulin  Pen Needle (PEN NEEDLES) 31G X 6 MM MISC 1 each by Does not apply route 3 (three) times daily with meals. 100 each 0    ipratropium (ATROVENT ) 0.02 % nebulizer solution Take 0.5 mg by nebulization 2 (two) times daily.      metoCLOPramide  (REGLAN ) 10 MG tablet Take 1 tablet (10 mg total) by mouth 3 (three) times daily with meals for 21 days. 63 tablet 0    metoprolol  tartrate (LOPRESSOR ) 25 MG tablet Take 25 mg by mouth daily.      mirtazapine  (REMERON ) 15 MG tablet Take 15 mg by mouth at bedtime.      mometasone -formoterol  (DULERA ) 200-5 MCG/ACT AERO Inhale 2 puffs into the lungs 2 (two) times daily. 1 each 1    Multiple Vitamin (MULTIVITAMIN WITH MINERALS) TABS tablet Take 1 tablet by mouth daily.      naloxone  (NARCAN ) nasal spray 4 mg/0.1 mL Place 0.4 mg into the nose once. CALL 911. SPR CONTENTS OF ONE SPRAYER (0.1ML) INTO ONE NOSTRIL. REPEAT IN 2-3 MIN IF SYMPTOMS OF OPIOID EMERGENCY PERSIST, ALTERNATE NOSTRILS 11/22/2021      Nutritional Supplements (FEEDING SUPPLEMENT, NEPRO CARB STEADY,) LIQD Take 237 mLs by mouth 3 (three) times daily between meals.  0    ondansetron  (ZOFRAN -ODT) 4 MG disintegrating tablet Take 1 tablet (4 mg total) by mouth every 8 (eight) hours as needed for nausea or vomiting. 30  tablet 0    oxyCODONE -acetaminophen  (PERCOCET) 10-325 MG tablet Take 1 tablet by mouth 5 (five) times daily as needed.      OXYCONTIN  10 MG 12 hr tablet Take 10 mg by mouth 3 (three) times daily.      OXYGEN Inhale 2 L into the lungs at bedtime as needed.      sodium chloride  (OCEAN) 0.65 % SOLN nasal spray Place 1 spray into both nostrils as needed for congestion. 30 mL 1    spironolactone (ALDACTONE) 25 MG tablet Take 25 mg by mouth daily.      tamsulosin  (FLOMAX ) 0.4 MG CAPS capsule Take 0.4 mg by mouth daily.      VENTOLIN  HFA 108 (90 Base) MCG/ACT inhaler Inhale 2 puffs into the lungs every 6 (six) hours as needed.      Social History  Socioeconomic History   Marital status: Married    Spouse name: Not on file   Number of children: Not on file   Years of education: Not on file   Highest education level: Not on file  Occupational History   Not on file  Tobacco Use   Smoking status: Former    Current packs/day: 0.00    Average packs/day: 1 pack/day for 57.0 years (57.0 ttl pk-yrs)    Types: Cigarettes    Start date: 43    Quit date: 2020    Years since quitting: 5.5   Smokeless tobacco: Never  Vaping Use   Vaping status: Never Used  Substance and Sexual Activity   Alcohol use: No   Drug use: No   Sexual activity: Not on file  Other Topics Concern   Not on file  Social History Narrative   Not on file   Social Drivers of Health   Financial Resource Strain: Low Risk  (07/12/2023)   Received from [facility]   Overall Financial Resource Strain (CARDIA)    Difficulty of Paying Living Expenses: Not hard at all  Food Insecurity: No Food Insecurity (12/05/2023)   Hunger Vital Sign    Worried About Running Out of Food in the Last Year: Never true    Ran Out of Food in the Last Year: Never true  Transportation Needs: No Transportation Needs (12/05/2023)   PRAPARE - Administrator, Civil Service (Medical): No    Lack of Transportation  (Non-Medical): No  Physical Activity: Not on file  Stress: Not on file  Social Connections: Unknown (12/05/2023)   Social Connection and Isolation Panel    Frequency of Communication with Friends and Family: Patient declined    Frequency of Social Gatherings with Friends and Family: Patient declined    Attends Religious Services: Patient declined    Database administrator or Organizations: Patient declined    Attends Banker Meetings: Patient declined    Marital Status: Married  Catering manager Violence: Not At Risk (12/05/2023)   Humiliation, Afraid, Rape, and Kick questionnaire    Fear of Current or Ex-Partner: No    Emotionally Abused: No    Physically Abused: No    Sexually Abused: No    History reviewed. No pertinent family history.   Vitals:   12/06/23 0400 12/06/23 0500 12/06/23 0600 12/06/23 0700  BP: (!) 145/51 (!) 126/54 (!) 129/49 (!) 132/37  Pulse: (!) 43 (!) 39 (!) 38 (!) 38  Resp: (!) 21 13 13 14   Temp: 98.2 F (36.8 C)     TempSrc:      SpO2: 97% 100% 100% 100%  Weight:      Height:        PHYSICAL EXAM General: Well appearing elderly male, well nourished, in no acute distress. HEENT: Normocephalic and atraumatic. Neck: No JVD.   Lungs: Normal respiratory effort on 2L. Clear bilaterally to auscultation. No wheezes, crackles, rhonchi.  Heart: HRR, slow rates. Normal S1 and S2 without gallops or murmurs. Abdomen: Non-distended appearing.  Msk: Normal strength and tone for age. Extremities: Warm and well perfused. No clubbing, cyanosis, edema.  Neuro: Alert and oriented X 3. Psych: Answers questions appropriately.   Labs: Basic Metabolic Panel: Recent Labs    12/05/23 1636 12/06/23 0250  NA 139 140  K 4.1 4.0  CL 103 105  CO2 28 27  GLUCOSE 87 101*  BUN 49* 44*  CREATININE 1.78* 1.72*  CALCIUM 8.2*  7.9*   Liver Function Tests: Recent Labs    12/05/23 1636  AST 18  ALT 19  ALKPHOS 45  BILITOT 0.5  PROT 6.3*  ALBUMIN  3.4*    No results for input(s): LIPASE, AMYLASE in the last 72 hours. CBC: Recent Labs    12/05/23 1636 12/06/23 0250  WBC 7.1 5.6  HGB 10.5* 9.7*  HCT 33.2* 30.5*  MCV 101.2* 99.7  PLT 157 134*   Cardiac Enzymes: No results for input(s): CKTOTAL, CKMB, CKMBINDEX, TROPONINIHS in the last 72 hours. BNP: No results for input(s): BNP in the last 72 hours. D-Dimer: No results for input(s): DDIMER in the last 72 hours. Hemoglobin A1C: Recent Labs    12/05/23 1636  HGBA1C 5.6   Fasting Lipid Panel: No results for input(s): CHOL, HDL, LDLCALC, TRIG, CHOLHDL, LDLDIRECT in the last 72 hours. Thyroid  Function Tests: No results for input(s): TSH, T4TOTAL, T3FREE, THYROIDAB in the last 72 hours.  Invalid input(s): FREET3 Anemia Panel: No results for input(s): VITAMINB12, FOLATE, FERRITIN, TIBC, IRON , RETICCTPCT in the last 72 hours.   Radiology: No results found.  ECHO 07/02/2023 NORMAL LEFT VENTRICULAR SYSTOLIC FUNCTION WITH NO LVH  ESTIMATED EF: >55%, CALC EF(3D): 57%  INDETERMINATE DIASTOLIC FUNCTION  NORMAL RIGHT VENTRICULAR SYSTOLIC FUNCTION  VALVULAR REGURGITATION: TRIVIAL AR, TRIVIAL MR, No PR, TRIVIAL TR  NO VALVULAR STENOSIS   TELEMETRY reviewed by me 12/06/2023: complete heart block, rate 30-40s  EKG reviewed by me: high grade AV block, rate 39 bpm  Data reviewed by me 12/06/2023: last 24h vitals tele labs imaging I/O ED provider note, admission H&P.  Principal Problem:   Complete heart block (HCC)    ASSESSMENT AND PLAN:   Riley Martin is a 78 y.o. male  with a past medical history of paroxsymal atrial fibrillation (on Eliquis , amio), severe COPD (on home O2), CKD, hypertension who presented to the ED on 12/05/2023 after sent by outpatient cardiology as patient found to be in complete heart block and admitted to have leadless pacemaker implantation today. Patient states he's been significantly more fatigued over  the past 3 days. Cardiology was consulted for further evaluation.   # Complete heart block # Paroxsymal atrial fibrillation  Patient presents with worsening fatigued and in complete heart block. Electrolytes are stable. EKG and tele with complete heart block. Echo from 06/2023 with preserved EF and normal RV/LV systolic function and no significant valvular dysfunction.  -Avoid all AVN blockers. -Hold home Amio and Eliquis . Will plan to resume home Eliquis  tomorrow if Hgb and incision site are stable.  -Plan for leadless pacemaker placement with [employer] this afternoon with Dr. Ammon.  -Discussed the risks and benefits of proceeding with PPM for complete heart block. He is agreeable to proceed.  NPO until PPM this afternoon (07/31) with Dr. Ammon.  Written consent will be obtained.    This patient's plan of care was discussed and created with Dr. Wilburn and he is in agreement.  Signed: Dorene Comfort, PA-C  12/06/2023, 7:28 AM [facility]

## 2023-12-07 ENCOUNTER — Encounter: Payer: Self-pay | Admitting: Cardiology

## 2023-12-07 DIAGNOSIS — Z79899 Other long term (current) drug therapy: Secondary | ICD-10-CM | POA: Diagnosis not present

## 2023-12-07 DIAGNOSIS — I129 Hypertensive chronic kidney disease with stage 1 through stage 4 chronic kidney disease, or unspecified chronic kidney disease: Secondary | ICD-10-CM | POA: Diagnosis present

## 2023-12-07 DIAGNOSIS — J449 Chronic obstructive pulmonary disease, unspecified: Secondary | ICD-10-CM | POA: Diagnosis present

## 2023-12-07 DIAGNOSIS — J9611 Chronic respiratory failure with hypoxia: Secondary | ICD-10-CM | POA: Diagnosis present

## 2023-12-07 DIAGNOSIS — E1122 Type 2 diabetes mellitus with diabetic chronic kidney disease: Secondary | ICD-10-CM | POA: Diagnosis present

## 2023-12-07 DIAGNOSIS — Z7901 Long term (current) use of anticoagulants: Secondary | ICD-10-CM | POA: Diagnosis not present

## 2023-12-07 DIAGNOSIS — Z85828 Personal history of other malignant neoplasm of skin: Secondary | ICD-10-CM | POA: Diagnosis not present

## 2023-12-07 DIAGNOSIS — Z87891 Personal history of nicotine dependence: Secondary | ICD-10-CM | POA: Diagnosis not present

## 2023-12-07 DIAGNOSIS — Z9981 Dependence on supplemental oxygen: Secondary | ICD-10-CM | POA: Diagnosis not present

## 2023-12-07 DIAGNOSIS — N1832 Chronic kidney disease, stage 3b: Secondary | ICD-10-CM | POA: Diagnosis present

## 2023-12-07 DIAGNOSIS — I442 Atrioventricular block, complete: Secondary | ICD-10-CM | POA: Diagnosis present

## 2023-12-07 DIAGNOSIS — U099 Post covid-19 condition, unspecified: Secondary | ICD-10-CM | POA: Diagnosis present

## 2023-12-07 DIAGNOSIS — I480 Paroxysmal atrial fibrillation: Secondary | ICD-10-CM | POA: Diagnosis present

## 2023-12-07 DIAGNOSIS — R001 Bradycardia, unspecified: Secondary | ICD-10-CM | POA: Diagnosis present

## 2023-12-07 DIAGNOSIS — Z7951 Long term (current) use of inhaled steroids: Secondary | ICD-10-CM | POA: Diagnosis not present

## 2023-12-07 DIAGNOSIS — N400 Enlarged prostate without lower urinary tract symptoms: Secondary | ICD-10-CM | POA: Diagnosis present

## 2023-12-07 DIAGNOSIS — Z006 Encounter for examination for normal comparison and control in clinical research program: Secondary | ICD-10-CM | POA: Diagnosis not present

## 2023-12-07 LAB — BASIC METABOLIC PANEL WITH GFR
Anion gap: 8 (ref 5–15)
BUN: 32 mg/dL — ABNORMAL HIGH (ref 8–23)
CO2: 26 mmol/L (ref 22–32)
Calcium: 8.3 mg/dL — ABNORMAL LOW (ref 8.9–10.3)
Chloride: 107 mmol/L (ref 98–111)
Creatinine, Ser: 1.57 mg/dL — ABNORMAL HIGH (ref 0.61–1.24)
GFR, Estimated: 45 mL/min — ABNORMAL LOW (ref >60)
Glucose, Bld: 118 mg/dL — ABNORMAL HIGH (ref 70–99)
Potassium: 4.4 mmol/L (ref 3.5–5.1)
Sodium: 141 mmol/L (ref 135–145)

## 2023-12-07 LAB — MAGNESIUM: Magnesium: 2.4 mg/dL (ref 1.7–2.4)

## 2023-12-07 MED ORDER — AMIODARONE HCL 200 MG PO TABS: 200.0000 mg | ORAL_TABLET | Freq: Every day | ORAL | Status: AC

## 2023-12-07 MED ORDER — ENSURE PLUS HIGH PROTEIN PO LIQD: 237.0000 mL | Freq: Two times a day (BID) | ORAL | Status: DC

## 2023-12-07 MED ORDER — APIXABAN 2.5 MG PO TABS
2.5000 mg | ORAL_TABLET | Freq: Two times a day (BID) | ORAL | Status: DC
  Administered 2023-12-07: 2.5 mg via ORAL
  Filled 2023-12-07: qty 1

## 2023-12-07 MED ORDER — AMIODARONE HCL 200 MG PO TABS
200.0000 mg | ORAL_TABLET | Freq: Every day | ORAL | Status: DC
  Administered 2023-12-07: 200 mg via ORAL
  Filled 2023-12-07: qty 1

## 2023-12-07 NOTE — TOC CM/SW Note (Signed)
 Transition of Care [facility]) - Inpatient Brief Assessment   Patient Details  Name: Riley Martin MRN: [id number] Date of Birth: 01-03-1946  Transition of Care [facility]) CM/SW Contact:    Lauraine JAYSON Carpen, LCSW Phone Number: 12/07/2023, 10:44 AM   Clinical Narrative: Patient has orders to discharge home today. Chart reviewed. No TOC needs identified. CSW signing off.  Transition of Care Asessment: Insurance and Status: Insurance coverage has been reviewed Patient has primary care physician: Yes Home environment has been reviewed: Single family home Prior level of function:: Not documented Prior/Current Home Services: No current home services Social Drivers of Health Review: SDOH reviewed no interventions necessary Readmission risk has been reviewed: Yes Transition of care needs: no transition of care needs at this time

## 2023-12-07 NOTE — Discharge Summary (Signed)
 Physician Discharge Summary   Riley Martin  male DOB: 08/17/1945  FMW:[id number]  PCP: Alla Amis, MD  Admit date: 12/05/2023 Discharge date: 12/07/2023  Admitted From: home Disposition:  home CODE STATUS: Full code  Discharge Instructions     Diet - low sodium heart healthy   Complete by: As directed       Hospital Course:  For full details, please see H&P, progress notes, consult notes and ancillary notes.  Briefly,  Riley Martin is a 78 y.o. male with HTN, DM 2, severe COPD, CKD, long COVID who has been feeling weak for the past 2 years due to long COVID.  Over the past 4 weeks he has been feeling more tired than usual but 3 nights ago he felt so tired that he could not move around at all.  His wife noted his heart rate was 43.  He went to see his PCP who noted that he was bradycardic, was referred to cardiology who noted that he was in complete heart block so patient was admitted for pacemaker placement    Complete heart block --leadless pacemaker inserted on 12/06/23, tolerated it well.   --d/c'ed home lopressor .  Paroxsymal atrial fibrillation  --resume home amio, reduced from 200 mg BID to 200 mg daily. --cont home Eliquis    COPD Chronic hypoxemic respiratory failure on 2L O2 PRN --stable   DM 2, ruled out --A1c has been 5.6.  Not on home hypoglycemics.   --no need for BG checks.   CKD 3B Creatinine at baseline   Long COVID Patient states that he has significant fatigue at baseline for the past 2 years since he was diagnosed with COVID   Discharge Diagnoses:  Principal Problem:   Complete heart block [facility])     Discharge Instructions:  Allergies as of 12/07/2023       Reactions   Iodinated Contrast Media Other (See Comments), Nausea Only   Dry heaves, patient states he felt like he was on fire and about to pass out.  Pre-syncope, burning  Dry heaves, patient states he felt like he was on fire and about to pass out.    Lisinopril   Swelling   Tongue swelling, neck pain and Headaches    Tramadol Other (See Comments), Shortness Of Breath   Other reaction(s): Other (See Comments) Other Reaction: tachy Dizziness and unsteady gait.   Pregabalin Palpitations   Other reaction(s): Other (See Comments) Other Reaction: edema        Medication List     STOP taking these medications    blood glucose meter kit and supplies Kit   doxycycline  100 MG capsule Commonly known as: VIBRAMYCIN    metoCLOPramide  10 MG tablet Commonly known as: REGLAN    metoprolol  tartrate 25 MG tablet Commonly known as: LOPRESSOR    Pen Needles 31G X 6 MM Misc       TAKE these medications    amiodarone  200 MG tablet Commonly known as: PACERONE  Take 1 tablet (200 mg total) by mouth daily. Reduced from twice daily.  Home med. What changed:  when to take this additional instructions   apixaban  2.5 MG Tabs tablet Commonly known as: ELIQUIS  Take by mouth.   B-12 2500 MCG Subl Place 1 tablet under the tongue daily.   budesonide  1 MG/2ML nebulizer solution Commonly known as: PULMICORT  Take 1 mg by nebulization daily.   feeding supplement (NEPRO CARB STEADY) Liqd Take 237 mLs by mouth 3 (three) times daily between meals.   fluticasone  50 MCG/ACT  nasal spray Commonly known as: FLONASE  SPRAY 2 SPRAYS INTO EACH NOSTRIL EVERY DAY   furosemide  20 MG tablet Commonly known as: LASIX  Take 1 tablet (20 mg total) by mouth 2 (two) times daily. Resume it after checking kidney function and after discussing with primary care doctor   hydrocortisone  2.5 % cream Apply topically.   ipratropium 0.02 % nebulizer solution Commonly known as: ATROVENT  Take 0.5 mg by nebulization 2 (two) times daily.   mirtazapine  15 MG tablet Commonly known as: REMERON  Take 15 mg by mouth at bedtime.   mometasone -formoterol  200-5 MCG/ACT Aero Commonly known as: DULERA  Inhale 2 puffs into the lungs 2 (two) times daily.   multivitamin with minerals Tabs  tablet Take 1 tablet by mouth daily.   naloxone  4 MG/0.1ML Liqd nasal spray kit Commonly known as: NARCAN  Place 0.4 mg into the nose once. CALL 911. SPR CONTENTS OF ONE SPRAYER (0.1ML) INTO ONE NOSTRIL. REPEAT IN 2-3 MIN IF SYMPTOMS OF OPIOID EMERGENCY PERSIST, ALTERNATE NOSTRILS 11/22/2021   ondansetron  4 MG disintegrating tablet Commonly known as: ZOFRAN -ODT Take 1 tablet (4 mg total) by mouth every 8 (eight) hours as needed for nausea or vomiting.   oxyCODONE -acetaminophen  10-325 MG tablet Commonly known as: PERCOCET Take 1 tablet by mouth 5 (five) times daily as needed.   OxyCONTIN  10 MG 12 hr tablet Generic drug: oxyCODONE  Take 10 mg by mouth 3 (three) times daily.   OXYGEN Inhale 2 L into the lungs at bedtime as needed.   sodium chloride  0.65 % Soln nasal spray Commonly known as: OCEAN Place 1 spray into both nostrils as needed for congestion.   spironolactone 25 MG tablet Commonly known as: ALDACTONE Take 25 mg by mouth daily.   tamsulosin  0.4 MG Caps capsule Commonly known as: FLOMAX  Take 0.4 mg by mouth daily.   Ventolin  HFA 108 (90 Base) MCG/ACT inhaler Generic drug: albuterol  Inhale 2 puffs into the lungs every 6 (six) hours as needed.   albuterol  (2.5 MG/3ML) 0.083% nebulizer solution Commonly known as: PROVENTIL  Take 3 mLs by nebulization every 6 (six) hours as needed for wheezing or shortness of breath.         Follow-up Information     Alluri, Keller BROCKS, MD. Go today.   Specialty: Cardiology Why: 08/11 at [facility] information: [address] [place] [address] [phone]                 Allergies  Allergen Reactions   Iodinated Contrast Media Other (See Comments) and Nausea Only    Dry heaves, patient states he felt like he was on fire and about to pass out.  Pre-syncope, burning  Dry heaves, patient states he felt like he was on fire and about to pass out.    Lisinopril  Swelling    Tongue swelling, neck pain and  Headaches    Tramadol Other (See Comments) and Shortness Of Breath    Other reaction(s): Other (See Comments) Other Reaction: tachy Dizziness and unsteady gait.   Pregabalin Palpitations    Other reaction(s): Other (See Comments) Other Reaction: edema     The results of significant diagnostics from this hospitalization (including imaging, microbiology, ancillary and laboratory) are listed below for reference.   Consultations:   Procedures/Studies: EP PPM/ICD IMPLANT Result Date: 12/06/2023 Successful Medtronic Micra AV 2 leadless pacemaker implantation      Labs: BNP (last 3 results) No results for input(s): BNP in the last 8760 hours. Basic Metabolic Panel: Recent Labs  Lab 12/05/23 1636  12/06/23 0250 12/07/23 0544  NA 139 140 141  K 4.1 4.0 4.4  CL 103 105 107  CO2 28 27 26   GLUCOSE 87 101* 118*  BUN 49* 44* 32*  CREATININE 1.78* 1.72* 1.57*  CALCIUM 8.2* 7.9* 8.3*  MG  --   --  2.4   Liver Function Tests: Recent Labs  Lab 12/05/23 1636  AST 18  ALT 19  ALKPHOS 45  BILITOT 0.5  PROT 6.3*  ALBUMIN  3.4*   No results for input(s): LIPASE, AMYLASE in the last 168 hours. No results for input(s): AMMONIA in the last 168 hours. CBC: Recent Labs  Lab 12/05/23 1636 12/06/23 0250  WBC 7.1 5.6  HGB 10.5* 9.7*  HCT 33.2* 30.5*  MCV 101.2* 99.7  PLT 157 134*   Cardiac Enzymes: No results for input(s): CKTOTAL, CKMB, CKMBINDEX, TROPONINI in the last 168 hours. BNP: Invalid input(s): POCBNP CBG: Recent Labs  Lab 12/05/23 1855 12/06/23 0753 12/06/23 1115 12/06/23 1935  GLUCAP 73 76 70 82   D-Dimer No results for input(s): DDIMER in the last 72 hours. Hgb A1c Recent Labs    12/05/23 1636  HGBA1C 5.6   Lipid Profile No results for input(s): CHOL, HDL, LDLCALC, TRIG, CHOLHDL, LDLDIRECT in the last 72 hours. Thyroid  function studies Recent Labs    12/06/23 0250  TSH 3.332   Anemia work up No results for  input(s): VITAMINB12, FOLATE, FERRITIN, TIBC, IRON , RETICCTPCT in the last 72 hours. Urinalysis    Component Value Date/Time   COLORURINE YELLOW (A) 03/17/2023 2256   APPEARANCEUR CLEAR (A) 03/17/2023 2256   LABSPEC 1.012 03/17/2023 2256   PHURINE 5.0 03/17/2023 2256   GLUCOSEU NEGATIVE 03/17/2023 2256   HGBUR NEGATIVE 03/17/2023 2256   BILIRUBINUR NEGATIVE 03/17/2023 2256   KETONESUR 5 (A) 03/17/2023 2256   PROTEINUR NEGATIVE 03/17/2023 2256   NITRITE NEGATIVE 03/17/2023 2256   LEUKOCYTESUR NEGATIVE 03/17/2023 2256   Sepsis Labs Recent Labs  Lab 12/05/23 1636 12/06/23 0250  WBC 7.1 5.6   Microbiology Recent Results (from the past 240 hours)  MRSA Next Gen by PCR, Nasal     Status: None   Collection Time: 12/05/23  6:58 PM   Specimen: Nasal Mucosa; Nasal Swab  Result Value Ref Range Status   MRSA by PCR Next Gen NOT DETECTED NOT DETECTED Final    Comment: (NOTE) The GeneXpert MRSA Assay (FDA approved for NASAL specimens only), is one component of a comprehensive MRSA colonization surveillance program. It is not intended to diagnose MRSA infection nor to guide or monitor treatment for MRSA infections. Test performance is not FDA approved in patients less than 60 years old. Performed at [facility], [address] [place], [place] [address]      Total time spend on discharging this patient, including the last patient exam, discussing the hospital stay, instructions for ongoing care as it relates to all pertinent caregivers, as well as preparing the medical discharge records, prescriptions, and/or referrals as applicable, is 35 minutes.    Ellouise Haber, MD  [facility] 12/07/2023, 10:05 AM

## 2023-12-07 NOTE — Progress Notes (Signed)
 [facility] CLINIC CARDIOLOGY PROGRESS NOTE       Patient ID: Riley Martin MRN: [id number] DOB/AGE: [birth year] 78 y.o.  Admit date: 12/05/2023 Referring Physician Dr. Dana Primary Physician Alla Amis, MD Primary Cardiologist Dr. Wilburn Reason for Consultation complete heart block  HPI: Riley Martin is a 78 y.o. male  with a past medical history of paroxsymal atrial fibrillation (on Eliquis , amio), severe COPD (on home O2), CKD, hypertension who presented to the ED on 12/05/2023 after sent by outpatient cardiology as patient found to be in complete heart block and admitted to have leadless pacemaker implantation today. Patient states he's been significantly more fatigued over the past 3 days and dizziness. Cardiology was consulted for further evaluation.   Interval History: -Patient seen and examined this AM and laying comfortably in hospital bed. Patient states he feels great this AM and eager to go home. -Patients BP and HR stable this AM. Overnight Tele showed no significant events. Adequately ventricular pacing 70s.  -Remains on 2L with stable SpO2. Intermittently uses O2 at home) -Suture removed and covered with gauze and tegaderm. Right groin incision site is clean and dry with no evidence of significant swelling, bruising, or active bleeding .    Review of systems complete and found to be negative unless listed above    Past Medical History:  Diagnosis Date   A-fib (HCC)    Arthritis    BPH (benign prostatic hyperplasia)    Cancer (HCC)    skin cancer   Chronic pain    COPD (chronic obstructive pulmonary disease) (HCC)    COVID    GERD (gastroesophageal reflux disease)    Headache    History of insomnia    IBS (irritable bowel syndrome)    Iron  deficiency anemia due to chronic blood loss 07/12/2021   Orthostatic dizziness    PONV (postoperative nausea and vomiting)    Spinal stenosis    Torn rotator cuff    left    Past Surgical History:   Procedure Laterality Date   COLONOSCOPY     COLONOSCOPY WITH PROPOFOL  N/A 05/13/2021   Procedure: COLONOSCOPY WITH PROPOFOL ;  Surgeon: Therisa Bi, MD;  Location: [facility] ENDOSCOPY;  Service: Gastroenterology;  Laterality: N/A;   ESOPHAGOGASTRODUODENOSCOPY N/A 03/14/2021   Procedure: ESOPHAGOGASTRODUODENOSCOPY (EGD);  Surgeon: Onita Elspeth Sharper, DO;  Location: [facility] ENDOSCOPY;  Service: Gastroenterology;  Laterality: N/A;   ESOPHAGOGASTRODUODENOSCOPY N/A 05/13/2021   Procedure: ESOPHAGOGASTRODUODENOSCOPY (EGD);  Surgeon: Therisa Bi, MD;  Location: [facility] ENDOSCOPY;  Service: Gastroenterology;  Laterality: N/A;   ESOPHAGOGASTRODUODENOSCOPY N/A 10/10/2023   Procedure: EGD (ESOPHAGOGASTRODUODENOSCOPY);  Surgeon: Toledo, Ladell POUR, MD;  Location: [facility] ENDOSCOPY;  Service: Gastroenterology;  Laterality: N/A;  Eliquis    EXCISION CHONCHA BULLOSA Bilateral 06/02/2015   Procedure: BILATERAL CHONCHA BULLOSA;  Surgeon: Carolee Hunter, MD;  Location: [facility]GERY [facility]TR;  Service: ENT;  Laterality: Bilateral;   HERNIA REPAIR     MAXILLARY ANTROSTOMY Bilateral 06/02/2015   Procedure: MAXILLARY ANTROSTOMY;  Surgeon: Carolee Hunter, MD;  Location: [facility]ERY [facility];  Service: ENT;  Laterality: Bilateral;   NASAL POLYP SURGERY     NECK SURGERY  05/08/2006   no limitations   PACEMAKER LEADLESS INSERTION N/A 12/06/2023   Procedure: PACEMAKER LEADLESS INSERTION;  Surgeon: Ammon Blunt, MD;  Location: [facility] INVASIVE CV LAB;  Service: Cardiovascular;  Laterality: N/A;   UPPER GASTROINTESTINAL ENDOSCOPY      Medications Prior to Admission  Medication Sig Dispense Refill Last Dose/Taking   albuterol  (PROVENTIL ) (2.5 MG/3ML) 0.083% nebulizer solution Take  3 mLs by nebulization every 6 (six) hours as needed for wheezing or shortness of breath. 75 mL 0    amiodarone  (PACERONE ) 200 MG tablet Take 1 tablet (200 mg total) by mouth 2 (two) times daily. 60 tablet 1    apixaban  (ELIQUIS ) 2.5 MG TABS tablet Take by  mouth.      blood glucose meter kit and supplies KIT Dispense based on patient and insurance preference. Use up to four times daily as directed. 1 each 0    budesonide  (PULMICORT ) 1 MG/2ML nebulizer solution Take 1 mg by nebulization daily.      Cyanocobalamin  (B-12) 2500 MCG SUBL Place 1 tablet under the tongue daily. 90 tablet 1    doxycycline  (VIBRAMYCIN ) 100 MG capsule Take 100 mg by mouth 2 (two) times daily.      fluticasone  (FLONASE ) 50 MCG/ACT nasal spray SPRAY 2 SPRAYS INTO EACH NOSTRIL EVERY DAY 16 g 0    furosemide  (LASIX ) 20 MG tablet Take 1 tablet (20 mg total) by mouth 2 (two) times daily. Resume it after checking kidney function and after discussing with primary care doctor 30 tablet     hydrocortisone  2.5 % cream Apply topically.      Insulin  Pen Needle (PEN NEEDLES) 31G X 6 MM MISC 1 each by Does not apply route 3 (three) times daily with meals. 100 each 0    ipratropium (ATROVENT ) 0.02 % nebulizer solution Take 0.5 mg by nebulization 2 (two) times daily.      metoCLOPramide  (REGLAN ) 10 MG tablet Take 1 tablet (10 mg total) by mouth 3 (three) times daily with meals for 21 days. 63 tablet 0    metoprolol  tartrate (LOPRESSOR ) 25 MG tablet Take 25 mg by mouth daily.      mirtazapine  (REMERON ) 15 MG tablet Take 15 mg by mouth at bedtime.      mometasone -formoterol  (DULERA ) 200-5 MCG/ACT AERO Inhale 2 puffs into the lungs 2 (two) times daily. 1 each 1    Multiple Vitamin (MULTIVITAMIN WITH MINERALS) TABS tablet Take 1 tablet by mouth daily.      naloxone  (NARCAN ) nasal spray 4 mg/0.1 mL Place 0.4 mg into the nose once. CALL 911. SPR CONTENTS OF ONE SPRAYER (0.1ML) INTO ONE NOSTRIL. REPEAT IN 2-3 MIN IF SYMPTOMS OF OPIOID EMERGENCY PERSIST, ALTERNATE NOSTRILS 11/22/2021      Nutritional Supplements (FEEDING SUPPLEMENT, NEPRO CARB STEADY,) LIQD Take 237 mLs by mouth 3 (three) times daily between meals.  0    ondansetron  (ZOFRAN -ODT) 4 MG disintegrating tablet Take 1 tablet (4 mg total) by  mouth every 8 (eight) hours as needed for nausea or vomiting. 30 tablet 0    oxyCODONE -acetaminophen  (PERCOCET) 10-325 MG tablet Take 1 tablet by mouth 5 (five) times daily as needed.      OXYCONTIN  10 MG 12 hr tablet Take 10 mg by mouth 3 (three) times daily.      OXYGEN Inhale 2 L into the lungs at bedtime as needed.      sodium chloride  (OCEAN) 0.65 % SOLN nasal spray Place 1 spray into both nostrils as needed for congestion. 30 mL 1    spironolactone (ALDACTONE) 25 MG tablet Take 25 mg by mouth daily.      tamsulosin  (FLOMAX ) 0.4 MG CAPS capsule Take 0.4 mg by mouth daily.      VENTOLIN  HFA 108 (90 Base) MCG/ACT inhaler Inhale 2 puffs into the lungs every 6 (six) hours as needed.      Social History   Socioeconomic  History   Marital status: Married    Spouse name: Not on file   Number of children: Not on file   Years of education: Not on file   Highest education level: Not on file  Occupational History   Not on file  Tobacco Use   Smoking status: Former    Current packs/day: 0.00    Average packs/day: 1 pack/day for 57.0 years (57.0 ttl pk-yrs)    Types: Cigarettes    Start date: 59    Quit date: 2020    Years since quitting: 5.5   Smokeless tobacco: Never  Vaping Use   Vaping status: Never Used  Substance and Sexual Activity   Alcohol use: No   Drug use: No   Sexual activity: Not on file  Other Topics Concern   Not on file  Social History Narrative   Not on file   Social Drivers of Health   Financial Resource Strain: Low Risk  (07/12/2023)   Received from [facility]   Overall Financial Resource Strain (CARDIA)    Difficulty of Paying Living Expenses: Not hard at all  Food Insecurity: No Food Insecurity (12/05/2023)   Hunger Vital Sign    Worried About Running Out of Food in the Last Year: Never true    Ran Out of Food in the Last Year: Never true  Transportation Needs: No Transportation Needs (12/05/2023)   PRAPARE - Scientist, research (physical sciences) (Medical): No    Lack of Transportation (Non-Medical): No  Physical Activity: Not on file  Stress: Not on file  Social Connections: Unknown (12/05/2023)   Social Connection and Isolation Panel    Frequency of Communication with Friends and Family: Patient declined    Frequency of Social Gatherings with Friends and Family: Patient declined    Attends Religious Services: Patient declined    Database administrator or Organizations: Patient declined    Attends Banker Meetings: Patient declined    Marital Status: Married  Catering manager Violence: Not At Risk (12/05/2023)   Humiliation, Afraid, Rape, and Kick questionnaire    Fear of Current or Ex-Partner: No    Emotionally Abused: No    Physically Abused: No    Sexually Abused: No    History reviewed. No pertinent family history.   Vitals:   12/07/23 0500 12/07/23 0600 12/07/23 0800 12/07/23 0900  BP: (!) 122/42 (!) 129/52 (!) 149/69 133/79  Pulse: 73 68 84 70  Resp: 13 12 16 14   Temp:   98.1 F (36.7 C)   TempSrc:   Oral   SpO2: 97% 97% 97% 98%  Weight:      Height:        PHYSICAL EXAM General: Well appearing elderly male, well nourished, in no acute distress. HEENT: Normocephalic and atraumatic. Neck: No JVD.   Lungs: Normal respiratory effort on 2L. Clear bilaterally to auscultation. No wheezes, crackles, rhonchi.  Heart: HRR, slow rates. Normal S1 and S2 without gallops or murmurs. Abdomen: Non-distended appearing.  Msk: Normal strength and tone for age. Extremities: Warm and well perfused. No clubbing, cyanosis, edema.  Neuro: Alert and oriented X 3. Psych: Answers questions appropriately.   Labs: Basic Metabolic Panel: Recent Labs    12/06/23 0250 12/07/23 0544  NA 140 141  K 4.0 4.4  CL 105 107  CO2 27 26  GLUCOSE 101* 118*  BUN 44* 32*  CREATININE 1.72* 1.57*  CALCIUM 7.9* 8.3*  MG  --  2.4  Liver Function Tests: Recent Labs    12/05/23 1636  AST 18  ALT 19  ALKPHOS  45  BILITOT 0.5  PROT 6.3*  ALBUMIN  3.4*   No results for input(s): LIPASE, AMYLASE in the last 72 hours. CBC: Recent Labs    12/05/23 1636 12/06/23 0250  WBC 7.1 5.6  HGB 10.5* 9.7*  HCT 33.2* 30.5*  MCV 101.2* 99.7  PLT 157 134*   Cardiac Enzymes: No results for input(s): CKTOTAL, CKMB, CKMBINDEX, TROPONINIHS in the last 72 hours. BNP: No results for input(s): BNP in the last 72 hours. D-Dimer: No results for input(s): DDIMER in the last 72 hours. Hemoglobin A1C: Recent Labs    12/05/23 1636  HGBA1C 5.6   Fasting Lipid Panel: No results for input(s): CHOL, HDL, LDLCALC, TRIG, CHOLHDL, LDLDIRECT in the last 72 hours. Thyroid  Function Tests: Recent Labs    12/06/23 0250  TSH 3.332   Anemia Panel: No results for input(s): VITAMINB12, FOLATE, FERRITIN, TIBC, IRON , RETICCTPCT in the last 72 hours.   Radiology: EP PPM/ICD IMPLANT Result Date: 12/06/2023 Successful [employer]onic Micra AV 2 leadless pacemaker implantation    ECHO 07/02/2023 NORMAL LEFT VENTRICULAR SYSTOLIC FUNCTION WITH NO LVH  ESTIMATED EF: >55%, CALC EF(3D): 57%  INDETERMINATE DIASTOLIC FUNCTION  NORMAL RIGHT VENTRICULAR SYSTOLIC FUNCTION  VALVULAR REGURGITATION: TRIVIAL AR, TRIVIAL MR, No PR, TRIVIAL TR  NO VALVULAR STENOSIS   TELEMETRY reviewed by me 12/07/2023: ventricular pacing, rate 90s  EKG reviewed by me: high grade AV block, rate 39 bpm  Data reviewed by me 12/07/2023: last 24h vitals tele labs imaging I/O hospitalist progress notes.  Principal Problem:   Complete heart block (HCC)    ASSESSMENT AND PLAN:   Riley Martin is a 78 y.o. male  with a past medical history of paroxsymal atrial fibrillation (on Eliquis , amio), severe COPD (on home O2), CKD, hypertension who presented to the ED on 12/05/2023 after sent by outpatient cardiology as patient found to be in complete heart block and admitted to have leadless pacemaker implantation today.  Patient states he's been significantly more fatigued over the past 3 days. Cardiology was consulted for further evaluation.   # Complete heart block # Paroxsymal atrial fibrillation  Patient presents with worsening fatigued and in complete heart block. Electrolytes are stable. EKG and tele with complete heart block. Echo from 06/2023 with preserved EF and normal RV/LV systolic function and no significant valvular dysfunction.  Patient underwent Micra leadless pacemaker implantation on 07/31 with Dr. Ammon, patient tolerated procedure well.  Per telemetry pacer adequately ventricular pacing.  -Resume home p.o. Amio 200 mg daily. -Home Eliquis  2.5 mg twice daily for stroke risk reduction. The patient was brought to the cardiac cath lab and underwent  micra leadless pacemaker placement with Dr. Marsa Ammon on 07/31. The patient tolerated with procedure well without complications. On 08/01 the incision site was examined and found to be without significant erythema, tenderness to palpation, or apparent aneurysm. Figure of eight suture was removed at the beside by me  and was covered with dry gauze and tegaderm dressing. The patient was given care instructions and warning symptoms to look out at the incision site and will follow up in office in 1 week, or sooner if needed.   Ok for discharge today from a cardiac perspective after patient ambulates.  Follow-up as scheduled on 08/11 at 2 PM with Dr. Wilburn.  This patient's plan of care was discussed and created with Dr. Wilburn and he is in agreement.  Signed: Dorene Comfort, PA-C  12/07/2023, 9:43 AM [facility] Cardiology

## 2024-02-08 ENCOUNTER — Encounter: Payer: Self-pay | Admitting: Oncology

## 2024-02-21 ENCOUNTER — Inpatient Hospital Stay
Admission: EM | Admit: 2024-02-21 | Discharge: 2024-02-24 | DRG: 684 | Disposition: A | Discharge status: Home / Self Care | Attending: Internal Medicine | Admitting: Internal Medicine

## 2024-02-21 ENCOUNTER — Emergency Department

## 2024-02-21 ENCOUNTER — Other Ambulatory Visit: Payer: Self-pay

## 2024-02-21 DIAGNOSIS — Z85828 Personal history of other malignant neoplasm of skin: Secondary | ICD-10-CM

## 2024-02-21 DIAGNOSIS — D631 Anemia in chronic kidney disease: Secondary | ICD-10-CM | POA: Diagnosis present

## 2024-02-21 DIAGNOSIS — I2510 Atherosclerotic heart disease of native coronary artery without angina pectoris: Secondary | ICD-10-CM | POA: Diagnosis present

## 2024-02-21 DIAGNOSIS — N1832 Chronic kidney disease, stage 3b: Secondary | ICD-10-CM | POA: Diagnosis present

## 2024-02-21 DIAGNOSIS — Z87891 Personal history of nicotine dependence: Secondary | ICD-10-CM

## 2024-02-21 DIAGNOSIS — K589 Irritable bowel syndrome without diarrhea: Secondary | ICD-10-CM | POA: Diagnosis present

## 2024-02-21 DIAGNOSIS — N179 Acute kidney failure, unspecified: Secondary | ICD-10-CM | POA: Diagnosis not present

## 2024-02-21 DIAGNOSIS — E1122 Type 2 diabetes mellitus with diabetic chronic kidney disease: Secondary | ICD-10-CM | POA: Diagnosis present

## 2024-02-21 DIAGNOSIS — N189 Chronic kidney disease, unspecified: Secondary | ICD-10-CM

## 2024-02-21 DIAGNOSIS — I480 Paroxysmal atrial fibrillation: Secondary | ICD-10-CM | POA: Diagnosis present

## 2024-02-21 DIAGNOSIS — N2581 Secondary hyperparathyroidism of renal origin: Secondary | ICD-10-CM | POA: Diagnosis present

## 2024-02-21 DIAGNOSIS — J449 Chronic obstructive pulmonary disease, unspecified: Secondary | ICD-10-CM | POA: Diagnosis present

## 2024-02-21 DIAGNOSIS — R531 Weakness: Secondary | ICD-10-CM | POA: Diagnosis not present

## 2024-02-21 DIAGNOSIS — K219 Gastro-esophageal reflux disease without esophagitis: Secondary | ICD-10-CM | POA: Diagnosis present

## 2024-02-21 DIAGNOSIS — Z7951 Long term (current) use of inhaled steroids: Secondary | ICD-10-CM

## 2024-02-21 DIAGNOSIS — I129 Hypertensive chronic kidney disease with stage 1 through stage 4 chronic kidney disease, or unspecified chronic kidney disease: Secondary | ICD-10-CM | POA: Diagnosis present

## 2024-02-21 DIAGNOSIS — Z950 Presence of cardiac pacemaker: Secondary | ICD-10-CM

## 2024-02-21 DIAGNOSIS — Z1152 Encounter for screening for COVID-19: Secondary | ICD-10-CM

## 2024-02-21 DIAGNOSIS — Z23 Encounter for immunization: Secondary | ICD-10-CM

## 2024-02-21 DIAGNOSIS — Z862 Personal history of diseases of the blood and blood-forming organs and certain disorders involving the immune mechanism: Secondary | ICD-10-CM

## 2024-02-21 DIAGNOSIS — N400 Enlarged prostate without lower urinary tract symptoms: Secondary | ICD-10-CM | POA: Diagnosis present

## 2024-02-21 DIAGNOSIS — Z7901 Long term (current) use of anticoagulants: Secondary | ICD-10-CM

## 2024-02-21 DIAGNOSIS — Z91041 Radiographic dye allergy status: Secondary | ICD-10-CM

## 2024-02-21 LAB — COMPREHENSIVE METABOLIC PANEL WITH GFR
ALT: 9 U/L (ref 0–44)
AST: 19 U/L (ref 15–41)
Albumin: 3.8 g/dL (ref 3.5–5.0)
Alkaline Phosphatase: 44 U/L (ref 38–126)
Anion gap: 10 (ref 5–15)
BUN: 57 mg/dL — ABNORMAL HIGH (ref 8–23)
CO2: 25 mmol/L (ref 22–32)
Calcium: 8.8 mg/dL — ABNORMAL LOW (ref 8.9–10.3)
Chloride: 101 mmol/L (ref 98–111)
Creatinine, Ser: 4.02 mg/dL — ABNORMAL HIGH (ref 0.61–1.24)
GFR, Estimated: 14 mL/min — ABNORMAL LOW
Glucose, Bld: 123 mg/dL — ABNORMAL HIGH (ref 70–99)
Potassium: 4.8 mmol/L (ref 3.5–5.1)
Sodium: 136 mmol/L (ref 135–145)
Total Bilirubin: 0.5 mg/dL (ref 0.0–1.2)
Total Protein: 7.5 g/dL (ref 6.5–8.1)

## 2024-02-21 LAB — CBC WITH DIFFERENTIAL/PLATELET
Abs Immature Granulocytes: 0.02 K/uL (ref 0.00–0.07)
Basophils Absolute: 0.1 K/uL (ref 0.0–0.1)
Basophils Relative: 1 %
Eosinophils Absolute: 0.4 K/uL (ref 0.0–0.5)
Eosinophils Relative: 6 %
HCT: 28.9 % — ABNORMAL LOW (ref 39.0–52.0)
Hemoglobin: 9.2 g/dL — ABNORMAL LOW (ref 13.0–17.0)
Immature Granulocytes: 0 %
Lymphocytes Relative: 26 %
Lymphs Abs: 1.7 K/uL (ref 0.7–4.0)
MCH: 32.9 pg (ref 26.0–34.0)
MCHC: 31.8 g/dL (ref 30.0–36.0)
MCV: 103.2 fL — ABNORMAL HIGH (ref 80.0–100.0)
Monocytes Absolute: 0.6 K/uL (ref 0.1–1.0)
Monocytes Relative: 9 %
Neutro Abs: 3.8 K/uL (ref 1.7–7.7)
Neutrophils Relative %: 58 %
Platelets: 220 K/uL (ref 150–400)
RBC: 2.8 MIL/uL — ABNORMAL LOW (ref 4.22–5.81)
RDW: 12.3 % (ref 11.5–15.5)
WBC: 6.4 K/uL (ref 4.0–10.5)
nRBC: 0 % (ref 0.0–0.2)

## 2024-02-21 NOTE — ED Triage Notes (Signed)
 Patient's wife states patient had outpatient labs today and was called back due to elevated BUN and creatinine.

## 2024-02-21 NOTE — ED Provider Notes (Signed)
 [facility] Provider Note    Event Date/Time   First MD Initiated Contact with Patient 02/21/24 2301     (approximate)   History   Abnormal Labs   HPI  Riley Martin is a 78 y.o. male with history of cardiac pacemaker who comes in with concerns for abnormal labs.  Patient had blood work done that showed elevated creatinine and BUN so sent to the ER for evaluation.  Reviewed patient's BMP from 2 months ago with a creatinine of 1.5.  Patient reports over the past 2 or 3 weeks he has had increasing weakness.  Reports just generalized feeling unwell.  He states that he did run out of his Flomax  for a few days.  Does report a little bit of discomfort in his suprapubic area and low back pain over the past few days.  He denies any falls hitting his head but states that he feels that he could have fallen if he did not sit down due to his weakness.  He reports having a frothy mess to his urine.  Physical Exam   Triage Vital Signs: ED Triage Vitals [02/21/24 1728]  Encounter Vitals Group     BP 116/74     Girls Systolic BP Percentile      Girls Diastolic BP Percentile      Boys Systolic BP Percentile      Boys Diastolic BP Percentile      Pulse Rate 90     Resp 18     Temp 99.1 F (37.3 C)     Temp Source Oral     SpO2 100 %     Weight 140 lb (63.5 kg)     Height 6' (1.829 m)     Head Circumference      Peak Flow      Pain Score 0     Pain Loc      Pain Education      Exclude from Growth Chart     Most recent vital signs: Vitals:   02/21/24 1728  BP: 116/74  Pulse: 90  Resp: 18  Temp: 99.1 F (37.3 C)  SpO2: 100%     General: Awake, no distress.  CV:  Good peripheral perfusion.  Resp:  Normal effort.  Abd:  No distention.  Soft and nontender without any rebound, guarding Other:  No swelling   ED Results / Procedures / Treatments   Labs (all labs ordered are listed, but only abnormal results are displayed) Labs Reviewed   COMPREHENSIVE METABOLIC PANEL WITH GFR - Abnormal; Notable for the following components:      Result Value   Glucose, Bld 123 (*)    BUN 57 (*)    Creatinine, Ser 4.02 (*)    Calcium 8.8 (*)    GFR, Estimated 14 (*)    All other components within normal limits  CBC WITH DIFFERENTIAL/PLATELET - Abnormal; Notable for the following components:   RBC 2.80 (*)    Hemoglobin 9.2 (*)    HCT 28.9 (*)    MCV 103.2 (*)    All other components within normal limits       RADIOLOGY CT imaging without significant retention or hydro   PROCEDURES:  Critical Care performed: No  Procedures   MEDICATIONS ORDERED IN ED: Medications  sodium chloride  0.9 % bolus 1,000 mL (has no administration in time range)     IMPRESSION / MDM / ASSESSMENT AND PLAN / ED COURSE  I reviewed the  triage vital signs and the nursing notes.   Patient's presentation is most consistent with acute presentation with potential threat to life or bodily function.   Differential is UTI, bladder obstruction, electrolyte abnormalities, AKI.  Patient needs postvoid bladder scan, urine analysis.  Given patient's weakness in setting of elevated creatinine patient will require admission to the hospital.  Creatinine is 4.02 with a BUN of 57  With ambulation patient had to lean up against the wall and was feeling short of breath.  Added on EKG, troponin, BNP, chest x-ray these were all reassuring.  Patient does report feeling weak and given new creatinine we will discuss the hospital team for admission.  I did do a bladder scan without any significant retention and he report a little bit of discomfort in his lower abdomen, backside to get a CT scan without any acute pathology.  Will discuss with hospitalist.       FINAL CLINICAL IMPRESSION(S) / ED DIAGNOSES   Final diagnoses:  Weakness  AKI (acute kidney injury)     Rx / DC Orders   ED Discharge Orders     None        Note:  This document was prepared  using Dragon voice recognition software and may include unintentional dictation errors.   Ernest Ronal BRAVO, MD 02/22/24 [phone]

## 2024-02-22 ENCOUNTER — Emergency Department

## 2024-02-22 ENCOUNTER — Observation Stay

## 2024-02-22 DIAGNOSIS — Z1152 Encounter for screening for COVID-19: Secondary | ICD-10-CM | POA: Diagnosis not present

## 2024-02-22 DIAGNOSIS — Z7951 Long term (current) use of inhaled steroids: Secondary | ICD-10-CM | POA: Diagnosis not present

## 2024-02-22 DIAGNOSIS — N400 Enlarged prostate without lower urinary tract symptoms: Secondary | ICD-10-CM

## 2024-02-22 DIAGNOSIS — I480 Paroxysmal atrial fibrillation: Secondary | ICD-10-CM | POA: Diagnosis present

## 2024-02-22 DIAGNOSIS — E1122 Type 2 diabetes mellitus with diabetic chronic kidney disease: Secondary | ICD-10-CM | POA: Diagnosis present

## 2024-02-22 DIAGNOSIS — N1832 Chronic kidney disease, stage 3b: Secondary | ICD-10-CM | POA: Diagnosis present

## 2024-02-22 DIAGNOSIS — K589 Irritable bowel syndrome without diarrhea: Secondary | ICD-10-CM | POA: Diagnosis present

## 2024-02-22 DIAGNOSIS — Z85828 Personal history of other malignant neoplasm of skin: Secondary | ICD-10-CM | POA: Diagnosis not present

## 2024-02-22 DIAGNOSIS — Z91041 Radiographic dye allergy status: Secondary | ICD-10-CM | POA: Diagnosis not present

## 2024-02-22 DIAGNOSIS — N179 Acute kidney failure, unspecified: Secondary | ICD-10-CM | POA: Diagnosis present

## 2024-02-22 DIAGNOSIS — N189 Chronic kidney disease, unspecified: Secondary | ICD-10-CM

## 2024-02-22 DIAGNOSIS — Z87891 Personal history of nicotine dependence: Secondary | ICD-10-CM | POA: Diagnosis not present

## 2024-02-22 DIAGNOSIS — J449 Chronic obstructive pulmonary disease, unspecified: Secondary | ICD-10-CM

## 2024-02-22 DIAGNOSIS — Z7901 Long term (current) use of anticoagulants: Secondary | ICD-10-CM | POA: Diagnosis not present

## 2024-02-22 DIAGNOSIS — N2581 Secondary hyperparathyroidism of renal origin: Secondary | ICD-10-CM | POA: Diagnosis present

## 2024-02-22 DIAGNOSIS — R531 Weakness: Secondary | ICD-10-CM | POA: Diagnosis present

## 2024-02-22 DIAGNOSIS — I2510 Atherosclerotic heart disease of native coronary artery without angina pectoris: Secondary | ICD-10-CM | POA: Diagnosis present

## 2024-02-22 DIAGNOSIS — I129 Hypertensive chronic kidney disease with stage 1 through stage 4 chronic kidney disease, or unspecified chronic kidney disease: Secondary | ICD-10-CM | POA: Diagnosis present

## 2024-02-22 DIAGNOSIS — K219 Gastro-esophageal reflux disease without esophagitis: Secondary | ICD-10-CM | POA: Diagnosis present

## 2024-02-22 DIAGNOSIS — Z950 Presence of cardiac pacemaker: Secondary | ICD-10-CM | POA: Diagnosis not present

## 2024-02-22 DIAGNOSIS — Z23 Encounter for immunization: Secondary | ICD-10-CM | POA: Diagnosis not present

## 2024-02-22 DIAGNOSIS — D631 Anemia in chronic kidney disease: Secondary | ICD-10-CM | POA: Diagnosis present

## 2024-02-22 LAB — RESP PANEL BY RT-PCR (RSV, FLU A&B, COVID)  RVPGX2
Influenza A by PCR: NEGATIVE
Influenza B by PCR: NEGATIVE
Resp Syncytial Virus by PCR: NEGATIVE
SARS Coronavirus 2 by RT PCR: NEGATIVE

## 2024-02-22 LAB — URINALYSIS, ROUTINE W REFLEX MICROSCOPIC
Bilirubin Urine: NEGATIVE
Glucose, UA: NEGATIVE mg/dL
Hgb urine dipstick: NEGATIVE
Ketones, ur: NEGATIVE mg/dL
Leukocytes,Ua: NEGATIVE
Nitrite: NEGATIVE
Protein, ur: NEGATIVE mg/dL
Specific Gravity, Urine: 1.011 (ref 1.005–1.030)
pH: 6 (ref 5.0–8.0)

## 2024-02-22 LAB — BASIC METABOLIC PANEL WITH GFR
Anion gap: 7 (ref 5–15)
BUN: 47 mg/dL — ABNORMAL HIGH (ref 8–23)
CO2: 24 mmol/L (ref 22–32)
Calcium: 9 mg/dL (ref 8.9–10.3)
Chloride: 106 mmol/L (ref 98–111)
Creatinine, Ser: 3.53 mg/dL — ABNORMAL HIGH (ref 0.61–1.24)
GFR, Estimated: 17 mL/min — ABNORMAL LOW (ref >60)
Glucose, Bld: 94 mg/dL (ref 70–99)
Potassium: 4.8 mmol/L (ref 3.5–5.1)
Sodium: 137 mmol/L (ref 135–145)

## 2024-02-22 LAB — CBC
HCT: 27.2 % — ABNORMAL LOW (ref 39.0–52.0)
Hemoglobin: 8.7 g/dL — ABNORMAL LOW (ref 13.0–17.0)
MCH: 32.8 pg (ref 26.0–34.0)
MCHC: 32 g/dL (ref 30.0–36.0)
MCV: 102.6 fL — ABNORMAL HIGH (ref 80.0–100.0)
Platelets: 194 K/uL (ref 150–400)
RBC: 2.65 MIL/uL — ABNORMAL LOW (ref 4.22–5.81)
RDW: 12.2 % (ref 11.5–15.5)
WBC: 6.1 K/uL (ref 4.0–10.5)
nRBC: 0 % (ref 0.0–0.2)

## 2024-02-22 LAB — FERRITIN: Ferritin: 164 ng/mL (ref 24–336)

## 2024-02-22 LAB — BRAIN NATRIURETIC PEPTIDE: B Natriuretic Peptide: 47 pg/mL (ref 0.0–100.0)

## 2024-02-22 LAB — IRON AND TIBC
Iron: 52 ug/dL (ref 45–182)
Saturation Ratios: 20 % (ref 17.9–39.5)
TIBC: 262 ug/dL (ref 250–450)
UIBC: 210 ug/dL

## 2024-02-22 LAB — TROPONIN I (HIGH SENSITIVITY): Troponin I (High Sensitivity): 9 ng/L (ref <18)

## 2024-02-22 MED ORDER — VITAMIN B-12 1000 MCG PO TABS
2000.0000 ug | ORAL_TABLET | Freq: Every day | ORAL | Status: DC
  Administered 2024-02-22 – 2024-02-24 (×3): 2000 ug via ORAL
  Filled 2024-02-22 (×3): qty 2

## 2024-02-22 MED ORDER — LACTULOSE ENEMA
300.0000 mL | Freq: Once | ORAL | Status: DC
  Filled 2024-02-22: qty 300

## 2024-02-22 MED ORDER — APIXABAN 2.5 MG PO TABS
2.5000 mg | ORAL_TABLET | Freq: Two times a day (BID) | ORAL | Status: DC
  Administered 2024-02-22 – 2024-02-24 (×5): 2.5 mg via ORAL
  Filled 2024-02-22 (×5): qty 1

## 2024-02-22 MED ORDER — NEPRO/CARBSTEADY PO LIQD
237.0000 mL | Freq: Three times a day (TID) | ORAL | Status: DC
  Administered 2024-02-22 – 2024-02-24 (×4): 237 mL via ORAL

## 2024-02-22 MED ORDER — ONDANSETRON HCL 4 MG/2ML IJ SOLN
4.0000 mg | Freq: Four times a day (QID) | INTRAMUSCULAR | Status: DC | PRN
  Administered 2024-02-22: 4 mg via INTRAVENOUS
  Filled 2024-02-22: qty 2

## 2024-02-22 MED ORDER — NALOXONE HCL 4 MG/0.1ML NA LIQD: 0.4000 mg | NASAL | Status: DC | PRN

## 2024-02-22 MED ORDER — TAMSULOSIN HCL 0.4 MG PO CAPS
0.4000 mg | ORAL_CAPSULE | Freq: Every day | ORAL | Status: DC
  Administered 2024-02-22 – 2024-02-24 (×3): 0.4 mg via ORAL
  Filled 2024-02-22 (×3): qty 1

## 2024-02-22 MED ORDER — TRAZODONE HCL 50 MG PO TABS
25.0000 mg | ORAL_TABLET | Freq: Every evening | ORAL | Status: DC | PRN
  Administered 2024-02-23: 25 mg via ORAL
  Filled 2024-02-22 (×2): qty 1

## 2024-02-22 MED ORDER — SODIUM CHLORIDE 0.9 % IV SOLN: INTRAVENOUS | Status: AC

## 2024-02-22 MED ORDER — ACETAMINOPHEN 325 MG PO TABS
650.0000 mg | ORAL_TABLET | Freq: Four times a day (QID) | ORAL | Status: DC | PRN
  Administered 2024-02-22 – 2024-02-24 (×3): 650 mg via ORAL
  Filled 2024-02-22 (×4): qty 2

## 2024-02-22 MED ORDER — PROCHLORPERAZINE EDISYLATE 10 MG/2ML IJ SOLN
10.0000 mg | Freq: Once | INTRAMUSCULAR | Status: AC
  Administered 2024-02-22: 10 mg via INTRAVENOUS
  Filled 2024-02-22: qty 2

## 2024-02-22 MED ORDER — INFLUENZA VAC SPLIT HIGH-DOSE 0.5 ML IM SUSY
0.5000 mL | PREFILLED_SYRINGE | INTRAMUSCULAR | Status: AC
  Administered 2024-02-23: 0.5 mL via INTRAMUSCULAR
  Filled 2024-02-22: qty 0.5

## 2024-02-22 MED ORDER — LACTULOSE 10 GM/15ML PO SOLN
20.0000 g | Freq: Two times a day (BID) | ORAL | Status: DC
  Administered 2024-02-22 – 2024-02-24 (×4): 20 g via ORAL
  Filled 2024-02-22 (×7): qty 30

## 2024-02-22 MED ORDER — BUDESONIDE 1 MG/2ML IN SUSP: 1.0000 mg | Freq: Every day | RESPIRATORY_TRACT | Status: DC

## 2024-02-22 MED ORDER — AMIODARONE HCL 200 MG PO TABS
200.0000 mg | ORAL_TABLET | Freq: Every day | ORAL | Status: DC
  Administered 2024-02-22 – 2024-02-24 (×3): 200 mg via ORAL
  Filled 2024-02-22 (×3): qty 1

## 2024-02-22 MED ORDER — IPRATROPIUM-ALBUTEROL 0.5-2.5 (3) MG/3ML IN SOLN
3.0000 mL | RESPIRATORY_TRACT | Status: DC | PRN
  Administered 2024-02-22 – 2024-02-24 (×5): 3 mL via RESPIRATORY_TRACT
  Filled 2024-02-22 (×6): qty 3

## 2024-02-22 MED ORDER — SODIUM CHLORIDE 0.9 % IV BOLUS
1000.0000 mL | Freq: Once | INTRAVENOUS | Status: AC
  Administered 2024-02-22: 1000 mL via INTRAVENOUS

## 2024-02-22 MED ORDER — MAGNESIUM HYDROXIDE 400 MG/5ML PO SUSP
30.0000 mL | Freq: Every day | ORAL | Status: DC | PRN
  Administered 2024-02-22: 30 mL via ORAL
  Filled 2024-02-22: qty 30

## 2024-02-22 MED ORDER — ACETAMINOPHEN 650 MG RE SUPP: 650.0000 mg | Freq: Four times a day (QID) | RECTAL | Status: DC | PRN

## 2024-02-22 MED ORDER — FLUTICASONE FUROATE-VILANTEROL 200-25 MCG/ACT IN AEPB
1.0000 | INHALATION_SPRAY | Freq: Every day | RESPIRATORY_TRACT | Status: DC
  Administered 2024-02-22 – 2024-02-24 (×3): 1 via RESPIRATORY_TRACT
  Filled 2024-02-22: qty 28

## 2024-02-22 MED ORDER — ONDANSETRON HCL 4 MG PO TABS: 4.0000 mg | ORAL_TABLET | Freq: Four times a day (QID) | ORAL | Status: DC | PRN

## 2024-02-22 MED ORDER — ADULT MULTIVITAMIN W/MINERALS CH
1.0000 | ORAL_TABLET | Freq: Every day | ORAL | Status: DC
  Administered 2024-02-22 – 2024-02-24 (×3): 1 via ORAL
  Filled 2024-02-22 (×3): qty 1

## 2024-02-22 NOTE — Assessment & Plan Note (Signed)
 No acute concern. - Will continue DuoNebs and Pulmicort .

## 2024-02-22 NOTE — Assessment & Plan Note (Signed)
-   Continue Flomax

## 2024-02-22 NOTE — Plan of Care (Signed)
  Problem: Education: Goal: Knowledge of General Education information will improve Description: Including pain rating scale, medication(s)/side effects and non-pharmacologic comfort measures Outcome: Progressing   Problem: Activity: Goal: Risk for activity intolerance will decrease Outcome: Progressing   Problem: Nutrition: Goal: Adequate nutrition will be maintained Outcome: Progressing   Problem: Safety: Goal: Ability to remain free from injury will improve Outcome: Progressing   Problem: Skin Integrity: Goal: Risk for impaired skin integrity will decrease Outcome: Progressing   Problem: Pain Managment: Goal: General experience of comfort will improve and/or be controlled Outcome: Progressing   Problem: Safety: Goal: Ability to remain free from injury will improve Outcome: Progressing

## 2024-02-22 NOTE — Assessment & Plan Note (Deleted)
-   The patient was admitted to a medical-surgical observation bed. - Will continue hydration with IV normal saline. - Will follow BMP. - Will avoid nephrotoxins. - Will obtain bilateral renal ultrasound. - Nephrology consult to be obtained. - I notified Dr. Marcelino about the patient.

## 2024-02-22 NOTE — Hospital Course (Signed)
 Takes oxycontin  and oxcodone.   Takes for arthritis and spinal stensos.    [place]

## 2024-02-22 NOTE — ED Notes (Signed)
 Pt states unable to provide a urine specimen at the current.

## 2024-02-22 NOTE — H&P (Signed)
 [place]   PATIENT NAME: Riley Martin    MR#:  [id number]  DATE OF BIRTH:  03-29-1946  DATE OF ADMISSION:  02/21/2024  PRIMARY CARE PHYSICIAN: Alla Amis, MD   Patient is coming from: Home  REQUESTING/REFERRING PHYSICIAN: Ernest Shuck, MD  Riley COMPLAINT:   Riley Complaint  Patient presents with  . Abnormal Labs    HISTORY OF PRESENT ILLNESS:  Izzak Fries Hermida is a 78 y.o. male with medical history significant for paroxysmal atrial fibrillation on Eliquis , osteoarthritis, BPH, COPD, GERD, IBS and spinal stenosis, status post pacemaker, who presented to the emergency room with acute onset of generalized weakness for the last couple weeks with abnormal labs that showed elevated BUN and creatinine.  He had a creatinine 1.5 a couple months ago.  He has not been feeling well.  He ran out of his Flomax  few days ago.  He reported suprapubic discomfort and low back pain for the last few days.  No recent falls or trauma.  No chest pain or palpitations.  No cough or wheezing or dyspnea.  He has been having mild oliguria and frothy urine.  ED Course: When he came to the ER, temperature was 99.1 with otherwise normal vital signs.  Labs revealed a BUN of 57 and creatinine of 4.02 with calcium 8.8.  Labs revealed hemoglobin 9.2 hematocrit 28.9 and MCV 103.  Respiratory panel came back negative.  UA came back negative. EKG as reviewed by me :  EKG showed normal sinus rhythm with a rate of 82 with right axis deviation and incomplete right bundle blanch block. Imaging: Abdominal pelvic CT scan showed the following: 1. No acute findings in the abdomen or pelvis. 2. Nonobstructing 5 mm left lower pole renal calculus. No hydronephrosis. 3. Mild sigmoid diverticulosis, without evidence of diverticulitis.  The patient was given 1 L bolus of IV normal saline.  He will be admitted to medical-surgical observation bed for further evaluation and management. PAST MEDICAL HISTORY:   Past  Medical History:  Diagnosis Date  . A-fib (HCC)   . Arthritis   . BPH (benign prostatic hyperplasia)   . Cancer (HCC)    skin cancer  . Chronic pain   . COPD (chronic obstructive pulmonary disease) (HCC)   . COVID   . GERD (gastroesophageal reflux disease)   . Headache   . History of insomnia   . IBS (irritable bowel syndrome)   . Iron  deficiency anemia due to chronic blood loss 07/12/2021  . Orthostatic dizziness   . PONV (postoperative nausea and vomiting)   . Spinal stenosis   . Torn rotator cuff    left    PAST SURGICAL HISTORY:   Past Surgical History:  Procedure Laterality Date  . COLONOSCOPY    . COLONOSCOPY WITH PROPOFOL  N/A 05/13/2021   Procedure: COLONOSCOPY WITH PROPOFOL ;  Surgeon: Therisa Bi, MD;  Location: [facility] ENDOSCOPY;  Service: Gastroenterology;  Laterality: N/A;  . ESOPHAGOGASTRODUODENOSCOPY N/A 03/14/2021   Procedure: ESOPHAGOGASTRODUODENOSCOPY (EGD);  Surgeon: Onita Elspeth Sharper, DO;  Location: [facility] ENDOSCOPY;  Service: Gastroenterology;  Laterality: N/A;  . ESOPHAGOGASTRODUODENOSCOPY N/A 05/13/2021   Procedure: ESOPHAGOGASTRODUODENOSCOPY (EGD);  Surgeon: Therisa Bi, MD;  Location: [facility] ENDOSCOPY;  Service: Gastroenterology;  Laterality: N/A;  . ESOPHAGOGASTRODUODENOSCOPY N/A 10/10/2023   Procedure: EGD (ESOPHAGOGASTRODUODENOSCOPY);  Surgeon: Toledo, Ladell POUR, MD;  Location: [facility] ENDOSCOPY;  Service: Gastroenterology;  Laterality: N/A;  Eliquis   . EXCISION CHONCHA BULLOSA Bilateral 06/02/2015   Procedure: BILATERAL CHONCHA BULLOSA;  Surgeon: Carolee Hunter,  MD;  Location: [facility];  Service: ENT;  Laterality: Bilateral;  . HERNIA REPAIR    . MAXILLARY ANTROSTOMY Bilateral 06/02/2015   Procedure: MAXILLARY ANTROSTOMY;  Surgeon: Carolee Hunter, MD;  Location: [facility];  Service: ENT;  Laterality: Bilateral;  . NASAL POLYP SURGERY    . NECK SURGERY  05/08/2006   no limitations  . PACEMAKER LEADLESS INSERTION N/A 12/06/2023    Procedure: PACEMAKER LEADLESS INSERTION;  Surgeon: Ammon Blunt, MD;  Location: [facility] INVASIVE CV LAB;  Service: Cardiovascular;  Laterality: N/A;  . UPPER GASTROINTESTINAL ENDOSCOPY      SOCIAL HISTORY:   Social History   Tobacco Use  . Smoking status: Former    Current packs/day: 0.00    Average packs/day: 1 pack/day for 57.0 years (57.0 ttl pk-yrs)    Types: Cigarettes    Start date: 11    Quit date: 2020    Years since quitting: 5.7  . Smokeless tobacco: Never  Substance Use Topics  . Alcohol use: No    FAMILY HISTORY:  History reviewed. No pertinent family history.  DRUG ALLERGIES:   Allergies  Allergen Reactions  . Iodinated Contrast Media Other (See Comments) and Nausea Only    Dry heaves, patient states he felt like he was on fire and about to pass out.  Pre-syncope, burning  Dry heaves, patient states he felt like he was on fire and about to pass out.   . Lisinopril  Swelling    Tongue swelling, neck pain and Headaches   . Tramadol Other (See Comments) and Shortness Of Breath    Other reaction(s): Other (See Comments) Other Reaction: tachy Dizziness and unsteady gait.  . Pregabalin Palpitations    Other reaction(s): Other (See Comments) Other Reaction: edema    REVIEW OF SYSTEMS:   ROS As per history of present illness. All pertinent systems were reviewed above. Constitutional, HEENT, cardiovascular, respiratory, GI, GU, musculoskeletal, neuro, psychiatric, endocrine, integumentary and hematologic systems were reviewed and are otherwise negative/unremarkable except for positive findings mentioned above in the HPI.   MEDICATIONS AT HOME:   Prior to Admission medications   Medication Sig Start Date End Date Taking? Authorizing Provider  albuterol  (PROVENTIL ) (2.5 MG/3ML) 0.083% nebulizer solution Take 3 mLs by nebulization every 6 (six) hours as needed for wheezing or shortness of breath. 10/08/22 10/18/23  Viviann Pastor, MD  amiodarone  (PACERONE )  200 MG tablet Take 1 tablet (200 mg total) by mouth daily. Reduced from twice daily.  Home med. 12/07/23   Awanda City, MD  apixaban  (ELIQUIS ) 2.5 MG TABS tablet Take by mouth. 06/02/21   [provider]  budesonide  (PULMICORT ) 1 MG/2ML nebulizer solution Take 1 mg by nebulization daily.    [provider]  Cyanocobalamin  (B-12) 2500 MCG SUBL Place 1 tablet under the tongue daily. 10/12/21   Babara Call, MD  fluticasone  (FLONASE ) 50 MCG/ACT nasal spray SPRAY 2 SPRAYS INTO EACH NOSTRIL EVERY DAY 02/07/17   Verdia Art, MD  furosemide  (LASIX ) 20 MG tablet Take 1 tablet (20 mg total) by mouth 2 (two) times daily. Resume it after checking kidney function and after discussing with primary care doctor 05/18/21   Christobal Guadalajara, MD  hydrocortisone  2.5 % cream Apply topically. 06/29/21   [provider]  ipratropium (ATROVENT ) 0.02 % nebulizer solution Take 0.5 mg by nebulization 2 (two) times daily.    [provider]  mirtazapine  (REMERON ) 15 MG tablet Take 15 mg by mouth at bedtime. 12/14/20   [provider]  mometasone -formoterol  (DULERA ) 200-5 MCG/ACT AERO Inhale 2 puffs into the lungs 2 (two) times daily. 12/29/20   Fausto Burnard LABOR, DO  Multiple Vitamin (MULTIVITAMIN WITH MINERALS) TABS tablet Take 1 tablet by mouth daily. 12/30/20   Fausto Burnard LABOR, DO  naloxone  (NARCAN ) nasal spray 4 mg/0.1 mL Place 0.4 mg into the nose once. CALL 911. SPR CONTENTS OF ONE SPRAYER (0.1ML) INTO ONE NOSTRIL. REPEAT IN 2-3 MIN IF SYMPTOMS OF OPIOID EMERGENCY PERSIST, ALTERNATE NOSTRILS 11/22/2021 11/22/21   [provider]  Nutritional Supplements (FEEDING SUPPLEMENT, NEPRO CARB STEADY,) LIQD Take 237 mLs by mouth 3 (three) times daily between meals. 12/29/20   Fausto Burnard LABOR, DO  ondansetron  (ZOFRAN -ODT) 4 MG disintegrating tablet Take 1 tablet (4 mg total) by mouth every 8 (eight) hours as needed for nausea or vomiting. 07/04/23   Babara Call, MD  oxyCODONE -acetaminophen   (PERCOCET) 10-325 MG tablet Take 1 tablet by mouth 5 (five) times daily as needed. 06/24/21   [provider]  OXYCONTIN  10 MG 12 hr tablet Take 10 mg by mouth 3 (three) times daily. 12/22/20   [provider]  OXYGEN Inhale 2 L into the lungs at bedtime as needed.    [provider]  sodium chloride  (OCEAN) 0.65 % SOLN nasal spray Place 1 spray into both nostrils as needed for congestion. 12/29/20   Fausto Burnard LABOR, DO  spironolactone (ALDACTONE) 25 MG tablet Take 25 mg by mouth daily. 05/03/21   [provider]  tamsulosin  (FLOMAX ) 0.4 MG CAPS capsule Take 0.4 mg by mouth daily. 09/03/16   [provider]  VENTOLIN  HFA 108 (90 Base) MCG/ACT inhaler Inhale 2 puffs into the lungs every 6 (six) hours as needed. 08/07/16   [provider]      VITAL SIGNS:  Blood pressure (!) 137/58, pulse 79, temperature 99 F (37.2 C), resp. rate 20, height 6' (1.829 m), weight 63.5 kg, SpO2 99%.  PHYSICAL EXAMINATION:  Physical Exam  GENERAL:  78 y.o.-year-old male patient lying in the bed with no acute distress.  EYES: Pupils equal, round, reactive to light and accommodation. No scleral icterus. Extraocular muscles intact.  HEENT: Head atraumatic, normocephalic. Oropharynx and nasopharynx clear.  NECK:  Supple, no jugular venous distention. No thyroid  enlargement, no tenderness.  LUNGS: Normal breath sounds bilaterally, no wheezing, rales,rhonchi or crepitation. No use of accessory muscles of respiration.  CARDIOVASCULAR: Regular rate and rhythm, S1, S2 normal. No murmurs, rubs, or gallops.  ABDOMEN: Soft, nondistended, nontender. Bowel sounds present. No organomegaly or mass.  EXTREMITIES: No pedal edema, cyanosis, or clubbing.  NEUROLOGIC: Cranial nerves II through XII are intact. Muscle strength 5/5 in all extremities. Sensation intact. Gait not checked.  PSYCHIATRIC: The patient is alert and oriented x 3.  Normal affect and good eye contact. SKIN: No  obvious rash, lesion, or ulcer.   LABORATORY PANEL:   CBC Recent Labs  Lab 02/21/24 1730  WBC 6.4  HGB 9.2*  HCT 28.9*  PLT 220   ------------------------------------------------------------------------------------------------------------------  Chemistries  Recent Labs  Lab 02/21/24 1730  NA 136  K 4.8  CL 101  CO2 25  GLUCOSE 123*  BUN 57*  CREATININE 4.02*  CALCIUM 8.8*  AST 19  ALT 9  ALKPHOS 44  BILITOT 0.5   ------------------------------------------------------------------------------------------------------------------  Cardiac Enzymes No results for input(s): TROPONINI in the last 168 hours. ------------------------------------------------------------------------------------------------------------------  RADIOLOGY:  CT ABDOMEN PELVIS WO CONTRAST Result Date: 02/22/2024 EXAM: CT ABDOMEN AND PELVIS WITHOUT CONTRAST 02/22/2024 12:27:13 AM TECHNIQUE: CT of  the abdomen and pelvis was performed without the administration of intravenous contrast. Multiplanar reformatted images are provided for review. Automated exposure control, iterative reconstruction, and/or weight-based adjustment of the mA/kV was utilized to reduce the radiation dose to as low as reasonably achievable. COMPARISON: 10/12/2023 CLINICAL HISTORY: Abdominal pain, acute, nonlocalized. Patient's wife states patient had outpatient labs today and was called back due to elevated BUN and creatinine. FINDINGS: LOWER CHEST: Emphysematous changes at the lung bases. LIVER: The liver is unremarkable. GALLBLADDER AND BILE DUCTS: Gallbladder is unremarkable. No biliary ductal dilatation. SPLEEN: No acute abnormality. PANCREAS: No acute abnormality. ADRENAL GLANDS: No acute abnormality. KIDNEYS, URETERS AND BLADDER: 5 mm nonobstructing left lower pole renal calculus (image 30). No hydronephrosis. No perinephric or periureteral stranding. Urinary bladder is unremarkable. GI AND BOWEL: Stomach demonstrates no acute  abnormality. There is no bowel obstruction. Moderate transverse colonic stool burden. Mild sigmoid diverticulosis, without evidence of diverticulitis. APPENDIX: Normal appendix (image 48). PERITONEUM AND RETROPERITONEUM: No ascites. No free air. VASCULATURE: Atherosclerotic calcifications of the abdominal aorta and branch vessels. LYMPH NODES: No lymphadenopathy. REPRODUCTIVE ORGANS: Prostate is unremarkable. BONES AND SOFT TISSUES: Mild degenerative changes most prominent at L3-L4. No acute osseous abnormality. No focal soft tissue abnormality. IMPRESSION: 1. No acute findings in the abdomen or pelvis. 2. Nonobstructing 5 mm left lower pole renal calculus. No hydronephrosis. 3. Mild sigmoid diverticulosis, without evidence of diverticulitis. Electronically signed by: Pinkie Pebbles MD 02/22/2024 12:33 AM EDT RP Workstation: [id number]   DG Chest 2 View Result Date: 02/22/2024 EXAM: 2 VIEW(S) XRAY OF THE CHEST 02/22/2024 12:04:00 AM COMPARISON: AP and lateral chest 03/12/2023. CLINICAL HISTORY: sob. Shortness of breath. FINDINGS: LUNGS AND PLEURA: The lungs are emphysematous. No focal pulmonary opacity. No pulmonary edema. No pleural effusion. No pneumothorax. HEART AND MEDIASTINUM: A leadless pacemaker has been inserted into the right heart since the prior study. The mediastinum is stable, with aortic atherosclerosis. BONES AND SOFT TISSUES: Osteopenia and slight thoracic dextroscoliosis. Fusion plating partially visible mid to lower C-spine. IMPRESSION: 1. No acute cardiopulmonary findings. 2. Emphysematous lungs. 3. New leadless pacemaker in the right heart since the prior study. 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 - The patient will be admitted to a medical-surgical  observation bed. - Will continue hydration with IV normal saline. - Will follow BMP. - Will avoid nephrotoxins. - Will obtain bilateral renal ultrasound. - Nephrology consult to be obtained. - I notified Dr. Marcelino about the patient.  Paroxysmal atrial fibrillation (HCC) - Will continue Eliquis . - Will continue amiodarone  and Lopressor .  Chronic obstructive pulmonary disease (COPD) (HCC) - Will continue DuoNebs and Pulmicort .  BPH (benign prostatic hyperplasia) - Continue Flomax .   DVT prophylaxis: Eliquis . Advanced Care Planning:  Code Status: full code. Family Communication:  The plan of care was discussed in details with the patient (and family). I answered all questions. The patient agreed to proceed with the above mentioned plan. Further management will depend upon hospital course. Disposition Plan: Back to previous home environment Consults called: Nephrology All the records are reviewed and case discussed with ED provider.  Status is: Observation  I certify that at the time of admission, it is my clinical judgment that the patient will require hospital care extending less than 2 midnights.  Dispo: The patient is from: Home              Anticipated d/c is to: Home              Patient currently is not medically stable to d/c.              Difficult to place patient: No  Madison DELENA Peaches M.D on 02/22/2024 at 5:50 AM  [facility]   From 7 PM-7 AM, contact night-coverage [email or web address]  CC: Primary care physician; Alla Amis, MD

## 2024-02-22 NOTE — Assessment & Plan Note (Addendum)
-   Will continue Eliquis . - Will continue amiodarone  and Lopressor .

## 2024-02-22 NOTE — Assessment & Plan Note (Signed)
 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 Improving creatinine, now at 3.24 with baseline seems to be around 1.5. Renal ultrasound without any sign of obstructive uropathy.  Nephrology is on board - Continue with IV fluid -Monitor renal function -Avoid nephrotoxins

## 2024-02-22 NOTE — ED Notes (Signed)
 Fall risk bundle is currently in place.

## 2024-02-22 NOTE — Plan of Care (Addendum)
  Progress Note    Riley Martin   FMW:[id number]  DOB: 01-25-1946  DOA: 02/21/2024     0 Date of Service: 02/22/2024     Subjective:  Patient having some constipation. He takes opioids chronically for OA and back pain.   Hospital Problems Assessment and Plan: * Acute kidney injury superimposed on chronic kidney disease - This is superimposed on stage IIIb chronic kidney disease. BL scr 1.5 up to 4.0 this hospitalization     Latest Ref Rng & Units 02/21/2024    5:30 PM 12/07/2023    5:44 AM 12/06/2023    2:50 AM  BMP  Glucose 70 - 99 mg/dL 876  881  898   BUN 8 - 23 mg/dL 57  32  44   Creatinine 0.61 - 1.24 mg/dL 5.97  8.42  8.27   Sodium 135 - 145 mmol/L 136  141  140   Potassium 3.5 - 5.1 mmol/L 4.8  4.4  4.0   Chloride 98 - 111 mmol/L 101  107  105   CO2 22 - 32 mmol/L 25  26  27    Calcium 8.9 - 10.3 mg/dL 8.8  8.3  7.9   No hydronephrosis noted on imaging.  Continue IV hydration.  Follow-up nephrology recommendations  Paroxysmal atrial fibrillation (HCC) - Will continue Eliquis . - Will continue amiodarone  and Lopressor .  Chronic obstructive pulmonary disease (COPD) (HCC) - Will continue DuoNebs and Pulmicort .  BPH (benign prostatic hyperplasia) - Continue Flomax .  Macrocytic anemia  Hemoglobin stable from 2 months ago. Does not have iron  deficiency Iron /TIBC/Ferritin/ %Sat    Component Value Date/Time   IRON  52 02/22/2024 1037   TIBC 262 02/22/2024 1037   FERRITIN 164 02/22/2024 1037   IRONPCTSAT 20 02/22/2024 1037   Check b12 and folate in the AM       Objective    02/22/2024    1:12 PM 02/22/2024    3:39 AM 02/22/2024   12:41 AM  Vitals with BMI  Systolic 132 137 873  Diastolic 64 58 56  Pulse 90 79 78     Exam Physical Exam  Constitutional: In no distress.  Cardiovascular: Normal rate, regular rhythm. No lower extremity edema  Pulmonary: Non labored breathing on room air, no wheezing or rales.   Abdominal: Soft. Mildly TTP   Musculoskeletal: Normal range of motion.     Neurological: Alert and oriented to person, place, and time. Non focal  Skin: Skin is warm and dry.    Labs / Other Information    Latest Ref Rng & Units 02/21/2024    5:30 PM 12/07/2023    5:44 AM 12/06/2023    2:50 AM  BMP  Glucose 70 - 99 mg/dL 876  881  898   BUN 8 - 23 mg/dL 57  32  44   Creatinine 0.61 - 1.24 mg/dL 5.97  8.42  8.27   Sodium 135 - 145 mmol/L 136  141  140   Potassium 3.5 - 5.1 mmol/L 4.8  4.4  4.0   Chloride 98 - 111 mmol/L 101  107  105   CO2 22 - 32 mmol/L 25  26  27    Calcium 8.9 - 10.3 mg/dL 8.8  8.3  7.9        Time spent: 20 [facility] 02/22/2024, 2:24 PM

## 2024-02-22 NOTE — Progress Notes (Signed)
 [facility]  ROUNDING NOTE   Subjective:   Riley Martin is a 78 y.o. male with past medical conditions including BPH, GERD, COPD, atrial fibrillation on Eliquis , IBS, pacemaker, and chronic kidney disease stage IIIb. Patient presents to ED with abnormal labs and has been admitted for Weakness [R53.1] AKI (acute kidney injury) [N17.9]  Patient is known to our practice and is followed outpatient by Dr Dominica. He was last seen in office on July 17 for routine follow up. States he was contacted by primary team about abnormal labs. Does report generalized weakness for the past couple weeks. Wife is at bedside and states his appetite has decreased some, but not much. Denies NSAID use. [place] 3L, room air at baseline.   Labs on ED arrival concerning for BUN 57, creatinine 4.02 with GFR 14, and Hgb 9.2. Respiratory panel negative. UA clear. Chest xray negative for acute findings. CT abd pelvis shows 5mm nonobstructive stone. Renal US  penidng.  We have been consulted to evaluate acute kidney injury during this admission.    Objective:  Vital signs in last 24 hours:  Temp:  [98.4 F (36.9 C)-99.1 F (37.3 C)] 99 F (37.2 C) (10/17 0339) Pulse Rate:  [78-90] 79 (10/17 0339) Resp:  [18-20] 20 (10/17 0339) BP: (116-137)/(56-74) 137/58 (10/17 0339) SpO2:  [98 %-100 %] 99 % (10/17 0339) Weight:  [63.5 kg] 63.5 kg (10/16 1728)  Weight change:  Filed Weights   02/21/24 1728  Weight: 63.5 kg    Intake/Output: I/O last 3 completed shifts: In: 1000 [IV Piggyback:1000] Out: -    Intake/Output this shift:  No intake/output data recorded.  Physical Exam: General: NAD  Head: Normocephalic, atraumatic. Moist oral mucosal membranes  Eyes: Anicteric  Lungs:  Clear to auscultation.normal effort  Heart: Regular rate and rhythm  Abdomen:  Soft, nontender  Extremities:  No peripheral edema.  Neurologic: Awake, alert, conversant  Skin: Warm,dry, no rash  Access: None    Basic  Metabolic Panel: Recent Labs  Lab 02/21/24 1730  NA 136  K 4.8  CL 101  CO2 25  GLUCOSE 123*  BUN 57*  CREATININE 4.02*  CALCIUM 8.8*    Liver Function Tests: Recent Labs  Lab 02/21/24 1730  AST 19  ALT 9  ALKPHOS 44  BILITOT 0.5  PROT 7.5  ALBUMIN  3.8   No results for input(s): LIPASE, AMYLASE in the last 168 hours. No results for input(s): AMMONIA in the last 168 hours.  CBC: Recent Labs  Lab 02/21/24 1730  WBC 6.4  NEUTROABS 3.8  HGB 9.2*  HCT 28.9*  MCV 103.2*  PLT 220    Cardiac Enzymes: No results for input(s): CKTOTAL, CKMB, CKMBINDEX, TROPONINI in the last 168 hours.  BNP: Invalid input(s): POCBNP  CBG: No results for input(s): GLUCAP in the last 168 hours.  Microbiology: Results for orders placed or performed during the hospital encounter of 02/21/24  Resp panel by RT-PCR (RSV, Flu A&B, Covid) Anterior Nasal Swab     Status: None   Collection Time: 02/22/24 12:32 AM   Specimen: Anterior Nasal Swab  Result Value Ref Range Status   SARS Coronavirus 2 by RT PCR NEGATIVE NEGATIVE Final    Comment: (NOTE) SARS-CoV-2 target nucleic acids are NOT DETECTED.  The SARS-CoV-2 RNA is generally detectable in upper respiratory specimens during the acute phase of infection. The lowest concentration of SARS-CoV-2 viral copies this assay can detect is 138 copies/mL. A negative result does not preclude SARS-Cov-2 infection and should  not be used as the sole basis for treatment or other patient management decisions. A negative result may occur with  improper specimen collection/handling, submission of specimen other than nasopharyngeal swab, presence of viral mutation(s) within the areas targeted by this assay, and inadequate number of viral copies(<138 copies/mL). A negative result must be combined with clinical observations, patient history, and epidemiological information. The expected result is Negative.  Fact Sheet for Patients:   BloggerCourse.com  Fact Sheet for Healthcare Providers:  SeriousBroker.it  This test is no t yet approved or cleared by the [place]  FDA and  has been authorized for detection and/or diagnosis of SARS-CoV-2 by FDA under an Emergency Use Authorization (EUA). This EUA will remain  in effect (meaning this test can be used) for the duration of the COVID-19 declaration under Section 564(b)(1) of the Act, 21 U.S.C.section 360bbb-3(b)(1), unless the authorization is terminated  or revoked sooner.       Influenza A by PCR NEGATIVE NEGATIVE Final   Influenza B by PCR NEGATIVE NEGATIVE Final    Comment: (NOTE) The Xpert Xpress SARS-CoV-2/FLU/RSV plus assay is intended as an aid in the diagnosis of influenza from Nasopharyngeal swab specimens and should not be used as a sole basis for treatment. Nasal washings and aspirates are unacceptable for Xpert Xpress SARS-CoV-2/FLU/RSV testing.  Fact Sheet for Patients: BloggerCourse.com  Fact Sheet for Healthcare Providers: SeriousBroker.it  This test is not yet approved or cleared by the [place]  FDA and has been authorized for detection and/or diagnosis of SARS-CoV-2 by FDA under an Emergency Use Authorization (EUA). This EUA will remain in effect (meaning this test can be used) for the duration of the COVID-19 declaration under Section 564(b)(1) of the Act, 21 U.S.C. section 360bbb-3(b)(1), unless the authorization is terminated or revoked.     Resp Syncytial Virus by PCR NEGATIVE NEGATIVE Final    Comment: (NOTE) Fact Sheet for Patients: BloggerCourse.com  Fact Sheet for Healthcare Providers: SeriousBroker.it  This test is not yet approved or cleared by the [place]  FDA and has been authorized for detection and/or diagnosis of SARS-CoV-2 by FDA under an Emergency Use  Authorization (EUA). This EUA will remain in effect (meaning this test can be used) for the duration of the COVID-19 declaration under Section 564(b)(1) of the Act, 21 U.S.C. section 360bbb-3(b)(1), unless the authorization is terminated or revoked.  Performed at [facility], [address] [place], [place] [address]     Coagulation Studies: No results for input(s): LABPROT, INR in the last 72 hours.  Urinalysis: Recent Labs    02/22/24 0222  COLORURINE YELLOW*  LABSPEC 1.011  PHURINE 6.0  GLUCOSEU NEGATIVE  HGBUR NEGATIVE  BILIRUBINUR NEGATIVE  KETONESUR NEGATIVE  PROTEINUR NEGATIVE  NITRITE NEGATIVE  LEUKOCYTESUR NEGATIVE      Imaging: CT ABDOMEN PELVIS WO CONTRAST Result Date: 02/22/2024 EXAM: CT ABDOMEN AND PELVIS WITHOUT CONTRAST 02/22/2024 12:27:13 AM TECHNIQUE: CT of the abdomen and pelvis was performed without the administration of intravenous contrast. Multiplanar reformatted images are provided for review. Automated exposure control, iterative reconstruction, and/or weight-based adjustment of the mA/kV was utilized to reduce the radiation dose to as low as reasonably achievable. COMPARISON: 10/12/2023 CLINICAL HISTORY: Abdominal pain, acute, nonlocalized. Patient's wife states patient had outpatient labs today and was called back due to elevated BUN and creatinine. FINDINGS: LOWER CHEST: Emphysematous changes at the lung bases. LIVER: The liver is unremarkable. GALLBLADDER AND BILE DUCTS: Gallbladder is unremarkable. No biliary ductal dilatation. SPLEEN: No acute abnormality. PANCREAS: No  acute abnormality. ADRENAL GLANDS: No acute abnormality. KIDNEYS, URETERS AND BLADDER: 5 mm nonobstructing left lower pole renal calculus (image 30). No hydronephrosis. No perinephric or periureteral stranding. Urinary bladder is unremarkable. GI AND BOWEL: Stomach demonstrates no acute abnormality. There is no bowel obstruction. Moderate transverse colonic stool burden.  Mild sigmoid diverticulosis, without evidence of diverticulitis. APPENDIX: Normal appendix (image 48). PERITONEUM AND RETROPERITONEUM: No ascites. No free air. VASCULATURE: Atherosclerotic calcifications of the abdominal aorta and branch vessels. LYMPH NODES: No lymphadenopathy. REPRODUCTIVE ORGANS: Prostate is unremarkable. BONES AND SOFT TISSUES: Mild degenerative changes most prominent at L3-L4. No acute osseous abnormality. No focal soft tissue abnormality. IMPRESSION: 1. No acute findings in the abdomen or pelvis. 2. Nonobstructing 5 mm left lower pole renal calculus. No hydronephrosis. 3. Mild sigmoid diverticulosis, without evidence of diverticulitis. Electronically signed by: Pinkie Pebbles MD 02/22/2024 12:33 AM EDT RP Workstation: [id number]   DG Chest 2 View Result Date: 02/22/2024 EXAM: 2 VIEW(S) XRAY OF THE CHEST 02/22/2024 12:04:00 AM COMPARISON: AP and lateral chest 03/12/2023. CLINICAL HISTORY: sob. Shortness of breath. FINDINGS: LUNGS AND PLEURA: The lungs are emphysematous. No focal pulmonary opacity. No pulmonary edema. No pleural effusion. No pneumothorax. HEART AND MEDIASTINUM: A leadless pacemaker has been inserted into the right heart since the prior study. The mediastinum is stable, with aortic atherosclerosis. BONES AND SOFT TISSUES: Osteopenia and slight thoracic dextroscoliosis. Fusion plating partially visible mid to lower C-spine. IMPRESSION: 1. No acute cardiopulmonary findings. 2. Emphysematous lungs. 3. New leadless pacemaker in the right heart since the prior study. Electronically signed by: Francis Quam MD 02/22/2024 12:09 AM EDT RP Workstation: [id number]     Medications:    sodium chloride  125 mL/hr at 02/22/24 0955    amiodarone   200 mg Oral Daily   apixaban   2.5 mg Oral BID   cyanocobalamin   2,000 mcg Oral Daily   feeding supplement (NEPRO CARB STEADY)  237 mL Oral TID BM   fluticasone  furoate-vilanterol  1 puff Inhalation Daily   [START ON 02/23/2024]  Influenza vac split trivalent PF  0.5 mL Intramuscular Tomorrow-1000   mirtazapine   7.5 mg Oral QHS   multivitamin with minerals  1 tablet Oral Daily   tamsulosin   0.4 mg Oral Daily   acetaminophen  **OR** acetaminophen , ipratropium-albuterol , magnesium hydroxide, naloxone , ondansetron  **OR** ondansetron  (ZOFRAN ) IV, traZODone  Assessment/ Plan:  Mr. Riley Martin is a 78 y.o.  male with past medical conditions including BPH, GERD, COPD, atrial fibrillation on Eliquis , IBS, pacemaker, and chronic kidney disease stage IIIb. Patient presents to ED with abnormal labs and has been admitted for Weakness [R53.1] AKI (acute kidney injury) [N17.9]   Acute Kidney Injury on chronic kidney disease stage IIIb with baseline creatinine 1.57 and GFR of 45 on 12/07/23.  Acute kidney injury secondary to etiology unclear at this time Chronic kidney disease is secondary to diabetes, heart disease, tobacco use and hypertension. CT abd/pelvis shows 5mm non obstructing renal stone. Does admit he recently stopped taking Tamsilosin, however no blockages noted on imaging. Agree with IV hydration. Continue to avoid nephrotoxic agents and therapies.    Lab Results  Component Value Date   CREATININE 4.02 (H) 02/21/2024   CREATININE 1.57 (H) 12/07/2023   CREATININE 1.72 (H) 12/06/2023    Intake/Output Summary (Last 24 hours) at 02/22/2024 1047 Last data filed at 02/22/2024 0508 Gross per 24 hour  Intake 1000 ml  Output --  Net 1000 ml   2. Anemia of chronic kidney disease Lab Results  Component Value Date  HGB 9.2 (L) 02/21/2024    Hgb slightly decreased but stable. Will monitor for now  3. Secondary Hyperparathyroidism: with outpatient labs:None available Lab Results  Component Value Date   CALCIUM 8.8 (L) 02/21/2024   PHOS 4.6 12/27/2020    Will continue to monitor bone minerals during this admission.      LOS: 0 Riley Martin 10/17/202510:47 AM

## 2024-02-23 DIAGNOSIS — I480 Paroxysmal atrial fibrillation: Secondary | ICD-10-CM | POA: Diagnosis not present

## 2024-02-23 DIAGNOSIS — J449 Chronic obstructive pulmonary disease, unspecified: Secondary | ICD-10-CM | POA: Diagnosis not present

## 2024-02-23 DIAGNOSIS — N179 Acute kidney failure, unspecified: Secondary | ICD-10-CM | POA: Diagnosis not present

## 2024-02-23 DIAGNOSIS — N400 Enlarged prostate without lower urinary tract symptoms: Secondary | ICD-10-CM | POA: Diagnosis not present

## 2024-02-23 LAB — BASIC METABOLIC PANEL WITH GFR
Anion gap: 6 (ref 5–15)
BUN: 42 mg/dL — ABNORMAL HIGH (ref 8–23)
CO2: 24 mmol/L (ref 22–32)
Calcium: 8.5 mg/dL — ABNORMAL LOW (ref 8.9–10.3)
Chloride: 108 mmol/L (ref 98–111)
Creatinine, Ser: 3.24 mg/dL — ABNORMAL HIGH (ref 0.61–1.24)
GFR, Estimated: 19 mL/min — ABNORMAL LOW (ref >60)
Glucose, Bld: 89 mg/dL (ref 70–99)
Potassium: 4.8 mmol/L (ref 3.5–5.1)
Sodium: 138 mmol/L (ref 135–145)

## 2024-02-23 LAB — CBC
HCT: 26.8 % — ABNORMAL LOW (ref 39.0–52.0)
Hemoglobin: 8.7 g/dL — ABNORMAL LOW (ref 13.0–17.0)
MCH: 33.1 pg (ref 26.0–34.0)
MCHC: 32.5 g/dL (ref 30.0–36.0)
MCV: 101.9 fL — ABNORMAL HIGH (ref 80.0–100.0)
Platelets: 199 K/uL (ref 150–400)
RBC: 2.63 MIL/uL — ABNORMAL LOW (ref 4.22–5.81)
RDW: 11.9 % (ref 11.5–15.5)
WBC: 6.1 K/uL (ref 4.0–10.5)
nRBC: 0 % (ref 0.0–0.2)

## 2024-02-23 LAB — VITAMIN B12: Vitamin B-12: 771 pg/mL (ref 180–914)

## 2024-02-23 LAB — FOLATE: Folate: 19.9 ng/mL (ref >5.9)

## 2024-02-23 MED ORDER — DARBEPOETIN ALFA 40 MCG/0.4ML IJ SOSY
40.0000 ug | PREFILLED_SYRINGE | INTRAMUSCULAR | Status: DC
  Administered 2024-02-23: 40 ug via SUBCUTANEOUS
  Filled 2024-02-23: qty 0.4

## 2024-02-23 MED ORDER — LACTATED RINGERS IV SOLN: INTRAVENOUS | Status: AC

## 2024-02-23 NOTE — Evaluation (Signed)
 Occupational Therapy Evaluation Patient Details Name: Riley Martin MRN: [id number] DOB: 1945-12-25 Today's Date: 02/23/2024   History of Present Illness   Pt is a 78 y.o. male who presented to the ER with generalized weakness for the last couple weeks with abnormal labs that showed elevated BUN and creatinine. Current MD assessment: Acute kidney injury superimposed on chronic kidney disease. PMH of paroxysmal atrial fibrillation on Eliquis , osteoarthritis, BPH, COPD, GERD, IBS and spinal stenosis, status post pacemaker, CKD stage IIIb     Clinical Impressions Pt was seen for OT evaluation this date. PTA, he resides in a one level apartment with his wife. At baseline he is IND with all tasks, driving, and community mobile. On eval, he is able to perform bed mobility with IND and ambulate ~170 ft pushing IV pole with no LOB and SBA provided by therapist. Pt reports weakness is already improved and he has no further therapy needs. He was able to ambulate on RA with stable sp02. Will sign off in house with no further OT services warranted.     If plan is discharge home, recommend the following:         Functional Status Assessment   Patient has not had a recent decline in their functional status     Equipment Recommendations         Recommendations for Other Services         Precautions/Restrictions   Precautions Precautions: Fall Recall of Precautions/Restrictions: Intact Restrictions Weight Bearing Restrictions Per Provider Order: No     Mobility Bed Mobility Overal bed mobility: Independent                  Transfers Overall transfer level: Modified independent Equipment used: None               General transfer comment: stood from EOB with no assist/supervision and ambulated 170 ft pushing IV pole with SBA and no LOB      Balance Overall balance assessment: Modified Independent, No apparent balance deficits (not formally assessed)                                          ADL either performed or assessed with clinical judgement   ADL Overall ADL's : At baseline                                       General ADL Comments: reports he has been performing his ADLs without difficulty     Vision         Perception         Praxis         Pertinent Vitals/Pain Pain Assessment Pain Assessment: No/denies pain     Extremity/Trunk Assessment Upper Extremity Assessment Upper Extremity Assessment: Overall WFL for tasks assessed   Lower Extremity Assessment Lower Extremity Assessment: Overall WFL for tasks assessed       Communication Communication Communication: No apparent difficulties   Cognition Arousal: Alert Behavior During Therapy: WFL for tasks assessed/performed Cognition: No apparent impairments                               Following commands: Intact       Cueing  General Comments  Cueing Techniques: Verbal cues  stable sp02 on RA   Exercises     Shoulder Instructions      Home Living Family/patient expects to be discharged to:: Private residence Living Arrangements: Spouse/significant other Available Help at Discharge: Family;Available 24 hours/day Type of Home: Apartment Home Access: Stairs to enter Entrance Stairs-Number of Steps: 4 to enter the front and 2 to enter the back   Home Layout: One level     Bathroom Shower/Tub: Chief Strategy Officer: Handicapped height     Home Equipment: Agricultural consultant (2 wheels);Shower seat          Prior Functioning/Environment Prior Level of Function : Independent/Modified Independent;Driving             Mobility Comments: ind without AD use ADLs Comments: IND, drives and walks through grocery store, pharmacy, etc.    OT Problem List:     OT Treatment/Interventions:        OT Goals(Current goals can be found in the care plan section)       OT Frequency:        Co-evaluation              AM-PAC OT 6 Clicks Daily Activity     Outcome Measure Help from another person eating meals?: None Help from another person taking care of personal grooming?: None Help from another person toileting, which includes using toliet, bedpan, or urinal?: None Help from another person bathing (including washing, rinsing, drying)?: None Help from another person to put on and taking off regular upper body clothing?: None Help from another person to put on and taking off regular lower body clothing?: None 6 Click Score: 24   End of Session Nurse Communication: Mobility status  Activity Tolerance: Patient tolerated treatment well Patient left: in bed;with call bell/phone within reach;with family/visitor present  OT Visit Diagnosis: Other abnormalities of gait and mobility (R26.89)                Time: 8467-8457 OT Time Calculation (min): 10 min Charges:  OT General Charges $OT Visit: 1 Visit OT Evaluation $OT Eval Low Complexity: 1 Low  Noma Quijas, OTR/L 02/23/24, 3:49 PM  Shawnese Magner E Delsin Copen 02/23/2024, 3:47 PM

## 2024-02-23 NOTE — Plan of Care (Signed)
  Problem: Education: Goal: Knowledge of General Education information will improve Description: Including pain rating scale, medication(s)/side effects and non-pharmacologic comfort measures Outcome: Progressing   Problem: Activity: Goal: Risk for activity intolerance will decrease Outcome: Progressing   Problem: Nutrition: Goal: Adequate nutrition will be maintained Outcome: Progressing   Problem: Safety: Goal: Ability to remain free from injury will improve Outcome: Progressing   Problem: Skin Integrity: Goal: Risk for impaired skin integrity will decrease Outcome: Progressing   Problem: Safety: Goal: Ability to remain free from injury will improve Outcome: Progressing

## 2024-02-23 NOTE — Assessment & Plan Note (Signed)
 Hemoglobin at 8.7, seems stable, anemia panel with no significant deficiency, B12 pending.  Likely anemia of chronic disease secondary to CKD. -Will get benefit from Aranesp

## 2024-02-23 NOTE — Progress Notes (Signed)
 [facility]  PROGRESS NOTE   Subjective:   Patient seen at bedside.  Family in attendance.  Feels much better today.  Objective:  Vital signs: Blood pressure (!) 130/43, pulse 73, temperature 98.7 F (37.1 C), temperature source Oral, resp. rate 16, height 6' (1.829 m), weight 63.5 kg, SpO2 94%.  Intake/Output Summary (Last 24 hours) at 02/23/2024 1712 Last data filed at 02/23/2024 0620 Gross per 24 hour  Intake 1140.99 ml  Output --  Net 1140.99 ml   Filed Weights   02/21/24 1728  Weight: 63.5 kg     Physical Exam: General:  No acute distress  Head:  Normocephalic, atraumatic. Moist oral mucosal membranes  Eyes:  Anicteric  Neck:  Supple  Lungs:   Clear to auscultation, normal effort  Heart:  S1S2 no rubs  Abdomen:   Soft, nontender, bowel sounds present  Extremities:  peripheral edema.  Neurologic:  Awake, alert, following commands  Skin:  No lesions  Access:     Basic Metabolic Panel: Recent Labs  Lab 02/21/24 1730 02/22/24 1417 02/23/24 0609  NA 136 137 138  K 4.8 4.8 4.8  CL 101 106 108  CO2 25 24 24   GLUCOSE 123* 94 89  BUN 57* 47* 42*  CREATININE 4.02* 3.53* 3.24*  CALCIUM 8.8* 9.0 8.5*   GFR: Estimated Creatinine Clearance: 16.6 mL/min (A) (by C-G formula based on SCr of 3.24 mg/dL (H)).  Liver Function Tests: Recent Labs  Lab 02/21/24 1730  AST 19  ALT 9  ALKPHOS 44  BILITOT 0.5  PROT 7.5  ALBUMIN  3.8   No results for input(s): LIPASE, AMYLASE in the last 168 hours. No results for input(s): AMMONIA in the last 168 hours.  CBC: Recent Labs  Lab 02/21/24 1730 02/22/24 1417 02/23/24 0609  WBC 6.4 6.1 6.1  NEUTROABS 3.8  --   --   HGB 9.2* 8.7* 8.7*  HCT 28.9* 27.2* 26.8*  MCV 103.2* 102.6* 101.9*  PLT 220 194 199     HbA1C: Hgb A1c MFr Bld  Date/Time Value Ref Range Status  12/05/2023 04:36 PM 5.6 4.8 - 5.6 % Final    Comment:    (NOTE) Diagnosis of Diabetes The following HbA1c ranges recommended by  the American Diabetes Association (ADA) may be used as an aid in the diagnosis of diabetes mellitus.  Hemoglobin             Suggested A1C NGSP%              Diagnosis  <5.7                   Non Diabetic  5.7-6.4                Pre-Diabetic  >6.4                   Diabetic  <7.0                   Glycemic control for                       adults with diabetes.    05/10/2021 07:01 PM 5.6 4.8 - 5.6 % Final    Comment:    (NOTE) Pre diabetes:          5.7%-6.4%  Diabetes:              >6.4%  Glycemic control for   <7.0% adults with diabetes  Urinalysis: Recent Labs    02/22/24 0222  COLORURINE YELLOW*  LABSPEC 1.011  PHURINE 6.0  GLUCOSEU NEGATIVE  HGBUR NEGATIVE  BILIRUBINUR NEGATIVE  KETONESUR NEGATIVE  PROTEINUR NEGATIVE  NITRITE NEGATIVE  LEUKOCYTESUR NEGATIVE      Imaging: US  RENAL Result Date: 02/22/2024 CLINICAL DATA:  Acute kidney injury. EXAM: RENAL / URINARY TRACT ULTRASOUND COMPLETE COMPARISON:  Abdominopelvic CT earlier today FINDINGS: Right Kidney: Renal measurements: 8.5 x 5.5 x 5.2 cm = volume: 128 mL. Normal parenchymal echogenicity. Slight prominence of the renal collecting system without frank hydronephrosis. 1.2 cm cyst in the mid kidney. No visible renal calculi. Left Kidney: Renal measurements: 10.1 x 6.7 x 5.4 cm = volume: 190 mL. Normal parenchymal echogenicity. No hydronephrosis. The nonobstructing stone on CT is not well-defined by ultrasound. No evidence of focal lesion. Bladder: Physiologically distended. Gallbladder wall irregularity and thickening. Both ureteral jets are demonstrated. Other: None. IMPRESSION: 1. No obstructive uropathy. 2. Left renal stone on CT earlier today is not well demonstrated by ultrasound. 3. Mild bladder wall irregularity, recommend clinical correlation for cystitis. Electronically Signed   By: Andrea Gasman M.D.   On: 02/22/2024 15:28   CT ABDOMEN PELVIS WO CONTRAST Result Date: 02/22/2024 EXAM: CT  ABDOMEN AND PELVIS WITHOUT CONTRAST 02/22/2024 12:27:13 AM TECHNIQUE: CT of the abdomen and pelvis was performed without the administration of intravenous contrast. Multiplanar reformatted images are provided for review. Automated exposure control, iterative reconstruction, and/or weight-based adjustment of the mA/kV was utilized to reduce the radiation dose to as low as reasonably achievable. COMPARISON: 10/12/2023 CLINICAL HISTORY: Abdominal pain, acute, nonlocalized. Patient's wife states patient had outpatient labs today and was called back due to elevated BUN and creatinine. FINDINGS: LOWER CHEST: Emphysematous changes at the lung bases. LIVER: The liver is unremarkable. GALLBLADDER AND BILE DUCTS: Gallbladder is unremarkable. No biliary ductal dilatation. SPLEEN: No acute abnormality. PANCREAS: No acute abnormality. ADRENAL GLANDS: No acute abnormality. KIDNEYS, URETERS AND BLADDER: 5 mm nonobstructing left lower pole renal calculus (image 30). No hydronephrosis. No perinephric or periureteral stranding. Urinary bladder is unremarkable. GI AND BOWEL: Stomach demonstrates no acute abnormality. There is no bowel obstruction. Moderate transverse colonic stool burden. Mild sigmoid diverticulosis, without evidence of diverticulitis. APPENDIX: Normal appendix (image 48). PERITONEUM AND RETROPERITONEUM: No ascites. No free air. VASCULATURE: Atherosclerotic calcifications of the abdominal aorta and branch vessels. LYMPH NODES: No lymphadenopathy. REPRODUCTIVE ORGANS: Prostate is unremarkable. BONES AND SOFT TISSUES: Mild degenerative changes most prominent at L3-L4. No acute osseous abnormality. No focal soft tissue abnormality. IMPRESSION: 1. No acute findings in the abdomen or pelvis. 2. Nonobstructing 5 mm left lower pole renal calculus. No hydronephrosis. 3. Mild sigmoid diverticulosis, without evidence of diverticulitis. Electronically signed by: Pinkie Pebbles MD 02/22/2024 12:33 AM EDT RP Workstation:  [id number]   DG Chest 2 View Result Date: 02/22/2024 EXAM: 2 VIEW(S) XRAY OF THE CHEST 02/22/2024 12:04:00 AM COMPARISON: AP and lateral chest 03/12/2023. CLINICAL HISTORY: sob. Shortness of breath. FINDINGS: LUNGS AND PLEURA: The lungs are emphysematous. No focal pulmonary opacity. No pulmonary edema. No pleural effusion. No pneumothorax. HEART AND MEDIASTINUM: A leadless pacemaker has been inserted into the right heart since the prior study. The mediastinum is stable, with aortic atherosclerosis. BONES AND SOFT TISSUES: Osteopenia and slight thoracic dextroscoliosis. Fusion plating partially visible mid to lower C-spine. IMPRESSION: 1. No acute cardiopulmonary findings. 2. Emphysematous lungs. 3. New leadless pacemaker in the right heart since the prior study. Electronically signed by: Francis Quam MD 02/22/2024 12:09 AM EDT RP Workstation:  [id number]     Medications:    lactated ringers  75 mL/hr at 02/23/24 1155    amiodarone   200 mg Oral Daily   apixaban   2.5 mg Oral BID   cyanocobalamin   2,000 mcg Oral Daily   darbepoetin (ARANESP) injection - DIALYSIS  40 mcg Subcutaneous Q Sat-1800   feeding supplement (NEPRO CARB STEADY)  237 mL Oral TID BM   fluticasone  furoate-vilanterol  1 puff Inhalation Daily   lactulose  20 g Oral BID   lactulose  300 mL Rectal Once   multivitamin with minerals  1 tablet Oral Daily   tamsulosin   0.4 mg Oral Daily    Assessment/ Plan:     78 year old male with history of hypertension, coronary artery disease, status post pacemaker placement, COPD, BPH, osteoarthritis, 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  #1: Acute kidney injury on chronic kidney disease: Renal indices are slowly but steadily improving.  Will continue IV fluids at the present doses.  CT of the abdomen and pelvis was negative for any obstruction.  #2: Anemia: Will continue the iron   as per along with oral iron  supplementation.  #3: Secondary hyperparathyroidism: His PTH, calcium and phosphorus levels are within normal range.  #4: COPD: Continue the inhalers as ordered.  #5: BPH: Continue with Flomax .  Labs and medications reviewed. Will continue to follow along with you.   LOS: 1 Pinkey Edman, MD [facility] 10/18/20255:12 PM

## 2024-02-23 NOTE — Progress Notes (Addendum)
 Progress Note   Patient: Riley Martin FMW:[id number] DOB: Oct 28, 1945 DOA: 02/21/2024     1 DOS: the patient was seen and examined on 02/23/2024   Brief hospital course: Partly taken from prior notes.  Riley Martin is a 78 y.o. male with medical history significant for paroxysmal atrial fibrillation on Eliquis , osteoarthritis, BPH, COPD, GERD, IBS and spinal stenosis, status post pacemaker, CKD stage IIIb, who presented to the emergency room with generalized weakness for the last couple weeks with abnormal labs that showed elevated BUN and creatinine.   On presentation stable vitals, labs with BUN of 57, creatinine 4.02, baseline seems to be around 1.5, calcium 8.8, hemoglobin 9.2, MCV 103.  Respiratory panel negative.  UA negative for UTI CT abdomen and pelvis was negative for any acute intra-abdominal abnormality, did noted and nonobstructing left lower pole renal calculus, no hydronephrosis.  Renal ultrasound with no obstructive uropathy, mild bladder wall irregularity.  Nephrology is consulted and patient was started on IV fluid.  10/18: Vital stable, hemoglobin stable at 8.7 likely secondary to chronic renal disease as there was no specific deficiency noted on anemia panel, B12 Renal function slowly improving with creatinine now at 3.24.  Continuing IV fluid, PT and OT ordered   Assessment and Plan: * Acute kidney injury superimposed on chronic kidney disease This is superimposed on stage IIIb chronic kidney disease. Improving creatinine, now at 3.24 with baseline seems to be around 1.5. Renal ultrasound without any sign of obstructive uropathy.  Nephrology is on board - Continue with IV fluid -Monitor renal function -Avoid nephrotoxins  Paroxysmal atrial fibrillation (HCC) - Will continue Eliquis . - Will continue amiodarone  and Lopressor .  Chronic obstructive pulmonary disease (COPD) (HCC) No acute concern. - Will continue DuoNebs and Pulmicort .  BPH (benign  prostatic hyperplasia) - Continue Flomax .  Generalized weakness - Ordered PT and OT evaluation  History of anemia due to chronic kidney disease Hemoglobin at 8.7, seems stable, anemia panel with no significant deficiency, B12 pending.  Likely anemia of chronic disease secondary to CKD. -Will get benefit from Aranesp   Subjective: Patient was seen and examined today.  No new concern.  Per patient he is urinating okay.  Wife at bedside  Physical Exam: Vitals:   02/22/24 1729 02/22/24 1916 02/23/24 0541 02/23/24 0842  BP: (!) 127/58 (!) 107/42 (!) 123/42 (!) 113/37  Pulse: 80 84 76 77  Resp: 16 17 17 17   Temp: 97.9 F (36.6 C) 100 F (37.8 C) 98.6 F (37 C) 98.6 F (37 C)  TempSrc:    Oral  SpO2: 95% 98% 98% 99%  Weight:      Height:       General.  Frail elderly man, in no acute distress. Pulmonary.  Lungs clear bilaterally, normal respiratory effort. CV.  Regular rate and rhythm, no JVD, rub or murmur. Abdomen.  Soft, nontender, nondistended, BS positive. CNS.  Alert and oriented .  No focal neurologic deficit. Extremities.  No edema, no cyanosis, pulses intact and symmetrical. Psychiatry.  Judgment and insight appears normal.   Data Reviewed: Prior data reviewed  Family Communication: Discussed with wife at bedside  Disposition: Status is: Inpatient Remains inpatient appropriate because: Need more gentle IV fluid  Planned Discharge Destination: Home  DVT prophylaxis.  Eliquis  Time spent: 45 minutes  This record has been created using Conservation officer, historic buildings. Errors have been sought and corrected,but may not always be located. Such creation errors do not reflect on the standard of care.  Author: Amaryllis Dare, MD 02/23/2024 12:37 PM  For on call review [email or web address].

## 2024-02-23 NOTE — Progress Notes (Addendum)
 PT Cancellation Note  Patient Details Name: Riley Martin MRN: [id number] DOB: December 04, 1945   Cancelled Treatment:    Reason Eval/Treat Not Completed: Patient declined, no reason specified Patient declined getting up right now, states maybe later. Reports he has been getting up in room. Will re-attempt at later time/date.   Anthoney Sheppard 02/23/2024, 1:38 PM

## 2024-02-23 NOTE — Progress Notes (Signed)
 MEDICATION RELATED CONSULT NOTE - INITIAL   Pharmacy Consult for Aranesp Indication: anemia - CKD  Allergies  Allergen Reactions   Iodinated Contrast Media Other (See Comments) and Nausea Only    Dry heaves, patient states he felt like he was on fire and about to pass out.  Pre-syncope, burning  Dry heaves, patient states he felt like he was on fire and about to pass out.    Lisinopril  Swelling    Tongue swelling, neck pain and Headaches    Tramadol Other (See Comments) and Shortness Of Breath    Other reaction(s): Other (See Comments) Other Reaction: tachy Dizziness and unsteady gait.   Pregabalin Palpitations    Other reaction(s): Other (See Comments) Other Reaction: edema    Patient Measurements: Height: 6' (182.9 cm) Weight: 63.5 kg (140 lb) IBW/kg (Calculated) : 77.6  Vital Signs: Temp: 98.6 F (37 C) (10/18 0842) Temp Source: Oral (10/18 0842) BP: 113/37 (10/18 0842) Pulse Rate: 77 (10/18 0842)  Labs: Recent Labs    02/21/24 1730 02/22/24 1417 02/23/24 0609  WBC 6.4 6.1 6.1  HGB 9.2* 8.7* 8.7*  HCT 28.9* 27.2* 26.8*  PLT 220 194 199  CREATININE 4.02* 3.53* 3.24*  ALBUMIN  3.8  --   --   PROT 7.5  --   --   AST 19  --   --   ALT 9  --   --   ALKPHOS 44  --   --   BILITOT 0.5  --   --    Estimated Creatinine Clearance: 16.6 mL/min (A) (by C-G formula based on SCr of 3.24 mg/dL (H)).   Medical History: Past Medical History:  Diagnosis Date   A-fib (HCC)    Arthritis    BPH (benign prostatic hyperplasia)    Cancer (HCC)    skin cancer   Chronic pain    COPD (chronic obstructive pulmonary disease) (HCC)    COVID    GERD (gastroesophageal reflux disease)    Headache    History of insomnia    IBS (irritable bowel syndrome)    Iron  deficiency anemia due to chronic blood loss 07/12/2021   Orthostatic dizziness    PONV (postoperative nausea and vomiting)    Spinal stenosis    Torn rotator cuff    left    Assessment: Non-ICU patient with  chronic anemia, Ferritin 164, Folate 19.9 and Iron  52. Admitted with AKI on CKD IIIb. Pharmacy cosulted to start Aranesp by hospitalist.  Weight 63.5kg   Plan:  Start aranesp 40 mcg SQ every 7 days In the absence of transfusions, if Hgb increases >1 gm/dL over 2 week Weekly CBC and reticulocyte counts to monitor for expected increase in reticulocyte counts, hemoglobin and hematocrit. Weekly progress note must be written in response to these labs.  Md Smola Rodriguez-Guzman PharmD, BCPS 02/23/2024 1:07 PM

## 2024-02-23 NOTE — Assessment & Plan Note (Signed)
-   Ordered PT and OT evaluation

## 2024-02-24 DIAGNOSIS — I480 Paroxysmal atrial fibrillation: Secondary | ICD-10-CM | POA: Diagnosis not present

## 2024-02-24 DIAGNOSIS — J449 Chronic obstructive pulmonary disease, unspecified: Secondary | ICD-10-CM | POA: Diagnosis not present

## 2024-02-24 DIAGNOSIS — R531 Weakness: Secondary | ICD-10-CM

## 2024-02-24 DIAGNOSIS — N179 Acute kidney failure, unspecified: Secondary | ICD-10-CM | POA: Diagnosis not present

## 2024-02-24 DIAGNOSIS — Z862 Personal history of diseases of the blood and blood-forming organs and certain disorders involving the immune mechanism: Secondary | ICD-10-CM

## 2024-02-24 LAB — RENAL FUNCTION PANEL
Albumin: 3 g/dL — ABNORMAL LOW (ref 3.5–5.0)
Anion gap: 4 — ABNORMAL LOW (ref 5–15)
BUN: 38 mg/dL — ABNORMAL HIGH (ref 8–23)
CO2: 27 mmol/L (ref 22–32)
Calcium: 8.5 mg/dL — ABNORMAL LOW (ref 8.9–10.3)
Chloride: 107 mmol/L (ref 98–111)
Creatinine, Ser: 3.15 mg/dL — ABNORMAL HIGH (ref 0.61–1.24)
GFR, Estimated: 19 mL/min — ABNORMAL LOW (ref >60)
Glucose, Bld: 88 mg/dL (ref 70–99)
Phosphorus: 4.5 mg/dL (ref 2.5–4.6)
Potassium: 4.5 mmol/L (ref 3.5–5.1)
Sodium: 138 mmol/L (ref 135–145)

## 2024-02-24 MED ORDER — LACTATED RINGERS IV SOLN: INTRAVENOUS | Status: DC

## 2024-02-24 MED ORDER — MORPHINE SULFATE (PF) 2 MG/ML IV SOLN: 2.0000 mg | INTRAVENOUS | Status: DC | PRN

## 2024-02-24 NOTE — Discharge Summary (Signed)
 Physician Discharge Summary   Patient: Riley Martin MRN: [id number] DOB: [birth year]  Admit date:     02/21/2024  Discharge date: 02/24/24  Discharge Physician: Amaryllis Dare   PCP: Alla Amis, MD   Recommendations at discharge:  Please obtain CBC and renal function on follow-up His home Lasix  and spironolactone is being held, nephrology can resume when appropriate Follow-up with nephrology Follow-up with PCP  Discharge Diagnoses: Principal Problem:   Acute kidney injury superimposed on chronic kidney disease Active Problems:   Paroxysmal atrial fibrillation (HCC)   Chronic obstructive pulmonary disease (COPD) (HCC)   BPH (benign prostatic hyperplasia)   Generalized weakness   History of anemia due to chronic kidney disease   AKI (acute kidney injury)   Hospital Course: Partly taken from prior notes.  Riley Martin is a 78 y.o. male with medical history significant for paroxysmal atrial fibrillation on Eliquis , osteoarthritis, BPH, COPD, GERD, IBS and spinal stenosis, status post pacemaker, CKD stage IIIb, who presented to the emergency room with generalized weakness for the last couple weeks with abnormal labs that showed elevated BUN and creatinine.   On presentation stable vitals, labs with BUN of 57, creatinine 4.02, baseline seems to be around 1.5, calcium 8.8, hemoglobin 9.2, MCV 103.  Respiratory panel negative.  UA negative for UTI CT abdomen and pelvis was negative for any acute intra-abdominal abnormality, did noted and nonobstructing left lower pole renal calculus, no hydronephrosis.  Renal ultrasound with no obstructive uropathy, mild bladder wall irregularity.  Nephrology is consulted and patient was started on IV fluid.  10/18: Vital stable, hemoglobin stable at 8.7 likely secondary to chronic renal disease as there was no specific deficiency noted on anemia panel, B12 at 771. Renal function slowly improving with creatinine now at 3.24.  Continuing  IV fluid, PT and OT ordered  10/19: Vital stable, slowly improving renal function, creatinine now at 3.15, still above the baseline.  Patient feels much improved.  PT and OT with no follow-up recommendations.  Case was discussed with nephrology as patient wants to go home.  We will hold home Lasix  and spironolactone and he will have a close follow-up with nephrology for further assistance.  They can resume when appropriate.  Patient will continue the rest of his home medications and follow-up with his providers for further assistance.  Assessment and Plan: * Acute kidney injury superimposed on chronic kidney disease This is superimposed on stage IIIb chronic kidney disease. Improving creatinine, now at 3.15 with baseline seems to be around 1.5. Renal ultrasound without any sign of obstructive uropathy.  Nephrology is on board Received IV fluid, home Lasix  and spironolactone is being held -Avoid nephrotoxins  Paroxysmal atrial fibrillation (HCC) - Will continue Eliquis . - Will continue amiodarone  and Lopressor .  Chronic obstructive pulmonary disease (COPD) (HCC) No acute concern. - Will continue DuoNebs and Pulmicort .  BPH (benign prostatic hyperplasia) - Continue Flomax .  Generalized weakness - Ordered PT and OT evaluation-no follow-up recommendations  History of anemia due to chronic kidney disease Hemoglobin at 8.7, seems stable, anemia panel with no significant deficiency, B12 771.  Likely anemia of chronic disease secondary to CKD. -Will get benefit from Aranesp-1 dose given and rest will be taken care of by nephrology*  Consultants: Nephrology Procedures performed: None Disposition: Home Diet recommendation:  Discharge Diet Orders (From admission, onward)     Start     Ordered   02/24/24 0000  Diet - low sodium heart healthy  02/24/24 1006           Renal diet DISCHARGE MEDICATION: Allergies as of 02/24/2024       Reactions   Iodinated Contrast Media  Other (See Comments), Nausea Only   Dry heaves, patient states he felt like he was on fire and about to pass out.  Pre-syncope, burning  Dry heaves, patient states he felt like he was on fire and about to pass out.    Lisinopril  Swelling   Tongue swelling, neck pain and Headaches    Tramadol Other (See Comments), Shortness Of Breath   Other reaction(s): Other (See Comments) Other Reaction: tachy Dizziness and unsteady gait.   Pregabalin Palpitations   Other reaction(s): Other (See Comments) Other Reaction: edema        Medication List     PAUSE taking these medications    furosemide  20 MG tablet Wait to take this until your doctor or other care provider tells you to start again. Commonly known as: LASIX  Take 1 tablet (20 mg total) by mouth 2 (two) times daily. Resume it after checking kidney function and after discussing with primary care doctor   spironolactone 25 MG tablet Wait to take this until your doctor or other care provider tells you to start again. Commonly known as: ALDACTONE Take 25 mg by mouth daily.       TAKE these medications    amiodarone  200 MG tablet Commonly known as: PACERONE  Take 1 tablet (200 mg total) by mouth daily. Reduced from twice daily.  Home med.   apixaban  2.5 MG Tabs tablet Commonly known as: ELIQUIS  Take by mouth.   B-12 2500 MCG Subl Place 1 tablet under the tongue daily.   budesonide  1 MG/2ML nebulizer solution Commonly known as: PULMICORT  Take 1 mg by nebulization daily.   feeding supplement (NEPRO CARB STEADY) Liqd Take 237 mLs by mouth 3 (three) times daily between meals.   fluticasone  50 MCG/ACT nasal spray Commonly known as: FLONASE  SPRAY 2 SPRAYS INTO EACH NOSTRIL EVERY DAY   hydrocortisone  2.5 % cream Apply topically.   ipratropium 0.02 % nebulizer solution Commonly known as: ATROVENT  Take 0.5 mg by nebulization 2 (two) times daily.   mirtazapine  15 MG tablet Commonly known as: REMERON  Take 15 mg by mouth  at bedtime.   mometasone -formoterol  200-5 MCG/ACT Aero Commonly known as: DULERA  Inhale 2 puffs into the lungs 2 (two) times daily.   multivitamin with minerals Tabs tablet Take 1 tablet by mouth daily.   naloxone  4 MG/0.1ML Liqd nasal spray kit Commonly known as: NARCAN  Place 0.4 mg into the nose once. CALL 911. SPR CONTENTS OF ONE SPRAYER (0.1ML) INTO ONE NOSTRIL. REPEAT IN 2-3 MIN IF SYMPTOMS OF OPIOID EMERGENCY PERSIST, ALTERNATE NOSTRILS 11/22/2021   ondansetron  4 MG disintegrating tablet Commonly known as: ZOFRAN -ODT Take 1 tablet (4 mg total) by mouth every 8 (eight) hours as needed for nausea or vomiting.   oxyCODONE -acetaminophen  10-325 MG tablet Commonly known as: PERCOCET Take 1 tablet by mouth 5 (five) times daily as needed.   OxyCONTIN  10 MG 12 hr tablet Generic drug: oxyCODONE  Take 10 mg by mouth 3 (three) times daily.   OXYGEN Inhale 2 L into the lungs at bedtime as needed.   sodium chloride  0.65 % Soln nasal spray Commonly known as: OCEAN Place 1 spray into both nostrils as needed for congestion.   tamsulosin  0.4 MG Caps capsule Commonly known as: FLOMAX  Take 0.4 mg by mouth daily.   Ventolin  HFA 108 (90 Base) MCG/ACT  inhaler Generic drug: albuterol  Inhale 2 puffs into the lungs every 6 (six) hours as needed.   albuterol  (2.5 MG/3ML) 0.083% nebulizer solution Commonly known as: PROVENTIL  Take 3 mLs by nebulization every 6 (six) hours as needed for wheezing or shortness of breath.        Follow-up Information     Alla Amis, MD. Schedule an appointment as soon as possible for a visit in 1 week(s).   Specialty: Family Medicine Contact information: [address] [facility] [place] [address] [phone]         Dominica Brandy, MD. Schedule an appointment as soon as possible for a visit in 3 day(s).   Specialty: Nephrology Contact information: [address] [place]  [address] [phone]                Discharge Exam: Filed Weights   02/21/24 1728  Weight: 63.5 kg   General.  Frail and malnourished elderly man, in no acute distress. Pulmonary.  Lungs clear bilaterally, normal respiratory effort. CV.  Regular rate and rhythm, no JVD, rub or murmur. Abdomen.  Soft, nontender, nondistended, BS positive. CNS.  Alert and oriented .  No focal neurologic deficit. Extremities.  No edema, no cyanosis, pulses intact and symmetrical. Psychiatry.  Judgment and insight appears normal.   Condition at discharge: stable  The results of significant diagnostics from this hospitalization (including imaging, microbiology, ancillary and laboratory) are listed below for reference.   Imaging Studies: US  RENAL Result Date: 02/22/2024 CLINICAL DATA:  Acute kidney injury. EXAM: RENAL / URINARY TRACT ULTRASOUND COMPLETE COMPARISON:  Abdominopelvic CT earlier today FINDINGS: Right Kidney: Renal measurements: 8.5 x 5.5 x 5.2 cm = volume: 128 mL. Normal parenchymal echogenicity. Slight prominence of the renal collecting system without frank hydronephrosis. 1.2 cm cyst in the mid kidney. No visible renal calculi. Left Kidney: Renal measurements: 10.1 x 6.7 x 5.4 cm = volume: 190 mL. Normal parenchymal echogenicity. No hydronephrosis. The nonobstructing stone on CT is not well-defined by ultrasound. No evidence of focal lesion. Bladder: Physiologically distended. Gallbladder wall irregularity and thickening. Both ureteral jets are demonstrated. Other: None. IMPRESSION: 1. No obstructive uropathy. 2. Left renal stone on CT earlier today is not well demonstrated by ultrasound. 3. Mild bladder wall irregularity, recommend clinical correlation for cystitis. Electronically Signed   By: Andrea Gasman M.D.   On: 02/22/2024 15:28   CT ABDOMEN PELVIS WO CONTRAST Result Date: 02/22/2024 EXAM: CT ABDOMEN AND PELVIS WITHOUT CONTRAST 02/22/2024 12:27:13 AM TECHNIQUE: CT of the abdomen  and pelvis was performed without the administration of intravenous contrast. Multiplanar reformatted images are provided for review. Automated exposure control, iterative reconstruction, and/or weight-based adjustment of the mA/kV was utilized to reduce the radiation dose to as low as reasonably achievable. COMPARISON: 10/12/2023 CLINICAL HISTORY: Abdominal pain, acute, nonlocalized. Patient's wife states patient had outpatient labs today and was called back due to elevated BUN and creatinine. FINDINGS: LOWER CHEST: Emphysematous changes at the lung bases. LIVER: The liver is unremarkable. GALLBLADDER AND BILE DUCTS: Gallbladder is unremarkable. No biliary ductal dilatation. SPLEEN: No acute abnormality. PANCREAS: No acute abnormality. ADRENAL GLANDS: No acute abnormality. KIDNEYS, URETERS AND BLADDER: 5 mm nonobstructing left lower pole renal calculus (image 30). No hydronephrosis. No perinephric or periureteral stranding. Urinary bladder is unremarkable. GI AND BOWEL: Stomach demonstrates no acute abnormality. There is no bowel obstruction. Moderate transverse colonic stool burden. Mild sigmoid diverticulosis, without evidence of diverticulitis. APPENDIX: Normal appendix (image 48). PERITONEUM AND  RETROPERITONEUM: No ascites. No free air. VASCULATURE: Atherosclerotic calcifications of the abdominal aorta and branch vessels. LYMPH NODES: No lymphadenopathy. REPRODUCTIVE ORGANS: Prostate is unremarkable. BONES AND SOFT TISSUES: Mild degenerative changes most prominent at L3-L4. No acute osseous abnormality. No focal soft tissue abnormality. IMPRESSION: 1. No acute findings in the abdomen or pelvis. 2. Nonobstructing 5 mm left lower pole renal calculus. No hydronephrosis. 3. Mild sigmoid diverticulosis, without evidence of diverticulitis. Electronically signed by: Pinkie Pebbles MD 02/22/2024 12:33 AM EDT RP Workstation: [id number]   DG Chest 2 View Result Date: 02/22/2024 EXAM: 2 VIEW(S) XRAY OF THE CHEST  02/22/2024 12:04:00 AM COMPARISON: AP and lateral chest 03/12/2023. CLINICAL HISTORY: sob. Shortness of breath. FINDINGS: LUNGS AND PLEURA: The lungs are emphysematous. No focal pulmonary opacity. No pulmonary edema. No pleural effusion. No pneumothorax. HEART AND MEDIASTINUM: A leadless pacemaker has been inserted into the right heart since the prior study. The mediastinum is stable, with aortic atherosclerosis. BONES AND SOFT TISSUES: Osteopenia and slight thoracic dextroscoliosis. Fusion plating partially visible mid to lower C-spine. IMPRESSION: 1. No acute cardiopulmonary findings. 2. Emphysematous lungs. 3. New leadless pacemaker in the right heart since the prior study. Electronically signed by: Francis Quam MD 02/22/2024 12:09 AM EDT RP Workstation: [id number]    Microbiology: Results for orders placed or performed during the hospital encounter of 02/21/24  Resp panel by RT-PCR (RSV, Flu A&B, Covid) Anterior Nasal Swab     Status: None   Collection Time: 02/22/24 12:32 AM   Specimen: Anterior Nasal Swab  Result Value Ref Range Status   SARS Coronavirus 2 by RT PCR NEGATIVE NEGATIVE Final    Comment: (NOTE) SARS-CoV-2 target nucleic acids are NOT DETECTED.  The SARS-CoV-2 RNA is generally detectable in upper respiratory specimens during the acute phase of infection. The lowest concentration of SARS-CoV-2 viral copies this assay can detect is 138 copies/mL. A negative result does not preclude SARS-Cov-2 infection and should not be used as the sole basis for treatment or other patient management decisions. A negative result may occur with  improper specimen collection/handling, submission of specimen other than nasopharyngeal swab, presence of viral mutation(s) within the areas targeted by this assay, and inadequate number of viral copies(<138 copies/mL). A negative result must be combined with clinical observations, patient history, and epidemiological information. The expected result  is Negative.  Fact Sheet for Patients:  BloggerCourse.com  Fact Sheet for Healthcare Providers:  SeriousBroker.it  This test is no t yet approved or cleared by the [place]  FDA and  has been authorized for detection and/or diagnosis of SARS-CoV-2 by FDA under an Emergency Use Authorization (EUA). This EUA will remain  in effect (meaning this test can be used) for the duration of the COVID-19 declaration under Section 564(b)(1) of the Act, 21 U.S.C.section 360bbb-3(b)(1), unless the authorization is terminated  or revoked sooner.       Influenza A by PCR NEGATIVE NEGATIVE Final   Influenza B by PCR NEGATIVE NEGATIVE Final    Comment: (NOTE) The Xpert Xpress SARS-CoV-2/FLU/RSV plus assay is intended as an aid in the diagnosis of influenza from Nasopharyngeal swab specimens and should not be used as a sole basis for treatment. Nasal washings and aspirates are unacceptable for Xpert Xpress SARS-CoV-2/FLU/RSV testing.  Fact Sheet for Patients: BloggerCourse.com  Fact Sheet for Healthcare Providers: SeriousBroker.it  This test is not yet approved or cleared by the [place]  FDA and has been authorized for detection and/or diagnosis of SARS-CoV-2 by FDA under an Emergency Use Authorization (  EUA). This EUA will remain in effect (meaning this test can be used) for the duration of the COVID-19 declaration under Section 564(b)(1) of the Act, 21 U.S.C. section 360bbb-3(b)(1), unless the authorization is terminated or revoked.     Resp Syncytial Virus by PCR NEGATIVE NEGATIVE Final    Comment: (NOTE) Fact Sheet for Patients: BloggerCourse.com  Fact Sheet for Healthcare Providers: SeriousBroker.it  This test is not yet approved or cleared by the [place]  FDA and has been authorized for detection and/or diagnosis of  SARS-CoV-2 by FDA under an Emergency Use Authorization (EUA). This EUA will remain in effect (meaning this test can be used) for the duration of the COVID-19 declaration under Section 564(b)(1) of the Act, 21 U.S.C. section 360bbb-3(b)(1), unless the authorization is terminated or revoked.  Performed at [facility], [address] [place], [place] [address]     Labs: CBC: Recent Labs  Lab 02/21/24 1730 02/22/24 1417 02/23/24 0609  WBC 6.4 6.1 6.1  NEUTROABS 3.8  --   --   HGB 9.2* 8.7* 8.7*  HCT 28.9* 27.2* 26.8*  MCV 103.2* 102.6* 101.9*  PLT 220 194 199   Basic Metabolic Panel: Recent Labs  Lab 02/21/24 1730 02/22/24 1417 02/23/24 0609 02/24/24 0511  NA 136 137 138 138  K 4.8 4.8 4.8 4.5  CL 101 106 108 107  CO2 25 24 24 27   GLUCOSE 123* 94 89 88  BUN 57* 47* 42* 38*  CREATININE 4.02* 3.53* 3.24* 3.15*  CALCIUM 8.8* 9.0 8.5* 8.5*  PHOS  --   --   --  4.5   Liver Function Tests: Recent Labs  Lab 02/21/24 1730 02/24/24 0511  AST 19  --   ALT 9  --   ALKPHOS 44  --   BILITOT 0.5  --   PROT 7.5  --   ALBUMIN  3.8 3.0*   CBG: No results for input(s): GLUCAP in the last 168 hours.  Discharge time spent: greater than 30 minutes.  This record has been created using Conservation officer, historic buildings. Errors have been sought and corrected,but may not always be located. Such creation errors do not reflect on the standard of care.   Signed: Amaryllis Dare, MD [facility] 02/24/2024

## 2024-02-24 NOTE — Progress Notes (Signed)
 [facility]  PROGRESS NOTE   Subjective:   Hemodynamically stable  Objective:  Vital signs: Blood pressure (!) 123/58, pulse 81, temperature 98.5 F (36.9 C), temperature source Oral, resp. rate 17, height 6' (1.829 m), weight 63.5 kg, SpO2 99%.  Intake/Output Summary (Last 24 hours) at 02/24/2024 1218 Last data filed at 02/24/2024 0856 Gross per 24 hour  Intake --  Output 625 ml  Net -625 ml   Filed Weights   02/21/24 1728  Weight: 63.5 kg     Physical Exam: General:  No acute distress  Head:  Normocephalic, atraumatic. Moist oral mucosal membranes  Eyes:  Anicteric  Neck:  Supple  Lungs:   Clear to auscultation, normal effort  Heart:  S1S2 no rubs  Abdomen:   Soft, nontender, bowel sounds present  Extremities:  peripheral edema.  Neurologic:  Awake, alert, following commands  Skin:  No lesions  Access:     Basic Metabolic Panel: Recent Labs  Lab 02/21/24 1730 02/22/24 1417 02/23/24 0609 02/24/24 0511  NA 136 137 138 138  K 4.8 4.8 4.8 4.5  CL 101 106 108 107  CO2 25 24 24 27   GLUCOSE 123* 94 89 88  BUN 57* 47* 42* 38*  CREATININE 4.02* 3.53* 3.24* 3.15*  CALCIUM 8.8* 9.0 8.5* 8.5*  PHOS  --   --   --  4.5   GFR: Estimated Creatinine Clearance: 17.1 mL/min (A) (by C-G formula based on SCr of 3.15 mg/dL (H)).  Liver Function Tests: Recent Labs  Lab 02/21/24 1730 02/24/24 0511  AST 19  --   ALT 9  --   ALKPHOS 44  --   BILITOT 0.5  --   PROT 7.5  --   ALBUMIN  3.8 3.0*   No results for input(s): LIPASE, AMYLASE in the last 168 hours. No results for input(s): AMMONIA in the last 168 hours.  CBC: Recent Labs  Lab 02/21/24 1730 02/22/24 1417 02/23/24 0609  WBC 6.4 6.1 6.1  NEUTROABS 3.8  --   --   HGB 9.2* 8.7* 8.7*  HCT 28.9* 27.2* 26.8*  MCV 103.2* 102.6* 101.9*  PLT 220 194 199     HbA1C: Hgb A1c MFr Bld  Date/Time Value Ref Range Status  12/05/2023 04:36 PM 5.6 4.8 - 5.6 % Final    Comment:     (NOTE) Diagnosis of Diabetes The following HbA1c ranges recommended by the American Diabetes Association (ADA) may be used as an aid in the diagnosis of diabetes mellitus.  Hemoglobin             Suggested A1C NGSP%              Diagnosis  <5.7                   Non Diabetic  5.7-6.4                Pre-Diabetic  >6.4                   Diabetic  <7.0                   Glycemic control for                       adults with diabetes.    05/10/2021 07:01 PM 5.6 4.8 - 5.6 % Final    Comment:    (NOTE) Pre diabetes:  5.7%-6.4%  Diabetes:              >6.4%  Glycemic control for   <7.0% adults with diabetes     Urinalysis: Recent Labs    02/22/24 0222  COLORURINE YELLOW*  LABSPEC 1.011  PHURINE 6.0  GLUCOSEU NEGATIVE  HGBUR NEGATIVE  BILIRUBINUR NEGATIVE  KETONESUR NEGATIVE  PROTEINUR NEGATIVE  NITRITE NEGATIVE  LEUKOCYTESUR NEGATIVE      Imaging: No results found.   Medications:    lactated ringers  100 mL/hr at 02/24/24 1043    amiodarone   200 mg Oral Daily   apixaban   2.5 mg Oral BID   cyanocobalamin   2,000 mcg Oral Daily   darbepoetin (ARANESP) injection - DIALYSIS  40 mcg Subcutaneous Q Sat-1800   feeding supplement (NEPRO CARB STEADY)  237 mL Oral TID BM   fluticasone  furoate-vilanterol  1 puff Inhalation Daily   lactulose  20 g Oral BID   lactulose  300 mL Rectal Once   multivitamin with minerals  1 tablet Oral Daily   tamsulosin   0.4 mg Oral Daily    Assessment/ Plan:     78 year old male with history of hypertension, coronary artery disease, status post pacemaker placement, COPD, BPH, osteoarthritis, 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   #1: Acute kidney injury on chronic kidney disease: Renal indices are slowly but steadily improving.  Will continue IV fluids at the present doses.  CT of the abdomen and pelvis was negative for  any obstruction.   #2: Anemia: Will continue the iron  as per along with oral iron  supplementation.   #3: Secondary hyperparathyroidism: His PTH, calcium and phosphorus levels are within normal range.   #4: COPD: Continue the inhalers as ordered.   #5: BPH: Continue with Flomax .  Discharge planning in progress and is advised  to follow up in the office soon.  Labs and medications reviewed. Will continue to follow along with you.   LOS: 2 Pinkey Edman, MD [facility] 10/19/202512:18 PM

## 2024-02-24 NOTE — Evaluation (Signed)
 Physical Therapy Evaluation and Discharge Patient Details Name: Riley Martin MRN: [id number] DOB: [birth year] Today's Date: 02/24/2024  History of Present Illness  Pt is a 78 y.o. male who presented to the ER with generalized weakness for the last couple weeks with abnormal labs that showed elevated BUN and creatinine. Current MD assessment: Acute kidney injury superimposed on chronic kidney disease. PMH of paroxysmal atrial fibrillation on Eliquis , osteoarthritis, BPH, COPD, GERD, IBS and spinal stenosis, status post pacemaker, CKD stage IIIb  Clinical Impression  Patient is agreeable to PT evaluation. He is eager to go home today. He is ambulatory without assistive device at home but has a rolling walker.  Today the patient was able to walk a lap in the hallway with supervision, progressing to Mod I without assistive device. Occasional drifting with fatigue. Education on energy conservation techniques to use in home setting. Sp02 90% or higher on room air. The patient is anticipated to discharge home today. No further acute PT needs at this time.       If plan is discharge home, recommend the following: Supervision due to cognitive status;Help with stairs or ramp for entrance   Can travel by private vehicle        Equipment Recommendations None recommended by PT  Recommendations for Other Services       Functional Status Assessment Patient has not had a recent decline in their functional status     Precautions / Restrictions Precautions Precautions:  (low fall risk) Restrictions Weight Bearing Restrictions Per Provider Order: No      Mobility  Bed Mobility               General bed mobility comments: not assessed    Transfers Overall transfer level: Modified independent                      Ambulation/Gait Ambulation/Gait assistance: Supervision, Modified independent (Device/Increase time) Gait Distance (Feet): 170 Feet Assistive device: None Gait  Pattern/deviations: Step-through pattern, Decreased stride length, Narrow base of support, Drifts right/left Gait velocity: decreased     General Gait Details: occasional drifting with intermittent narrow base of support. Sp02 90% or above on room air. encouraged energy conservation strategies, monitoring for signs of fatigue and need for rest breaks for safety. rolling walker not used today but encouraged at home as needed based on symptoms of fatigue  Stairs            Wheelchair Mobility     Tilt Bed    Modified Rankin (Stroke Patients Only)       Balance Overall balance assessment: Needs assistance Sitting-balance support: Feet supported Sitting balance-Leahy Scale: Good     Standing balance support: No upper extremity supported Standing balance-Leahy Scale: Fair                               Pertinent Vitals/Pain Pain Assessment Pain Assessment: No/denies pain    Home Living Family/patient expects to be discharged to:: Private residence Living Arrangements: Spouse/significant other Available Help at Discharge: Family;Available 24 hours/day Type of Home: Apartment Home Access: Stairs to enter   Entrance Stairs-Number of Steps: 4 to enter the front and 2 to enter the back   Home Layout: One level Home Equipment: Agricultural consultant (2 wheels);Shower seat      Prior Function Prior Level of Function : Independent/Modified Independent;Driving  Mobility Comments: ind without AD use ADLs Comments: IND, drives and walks through grocery store, pharmacy, etc.     Extremity/Trunk Assessment   Upper Extremity Assessment Upper Extremity Assessment: Overall WFL for tasks assessed    Lower Extremity Assessment Lower Extremity Assessment: Generalized weakness       Communication   Communication Communication: No apparent difficulties    Cognition Arousal: Alert Behavior During Therapy: WFL for tasks assessed/performed   PT -  Cognitive impairments: No apparent impairments                         Following commands: Intact       Cueing Cueing Techniques: Verbal cues     General Comments      Exercises     Assessment/Plan    PT Assessment Patient does not need any further PT services  PT Problem List         PT Treatment Interventions      PT Goals (Current goals can be found in the Care Plan section)  Acute Rehab PT Goals PT Goal Formulation: All assessment and education complete, DC therapy    Frequency       Co-evaluation               AM-PAC PT 6 Clicks Mobility  Outcome Measure Help needed turning from your back to your side while in a flat bed without using bedrails?: None Help needed moving from lying on your back to sitting on the side of a flat bed without using bedrails?: None Help needed moving to and from a bed to a chair (including a wheelchair)?: None Help needed standing up from a chair using your arms (e.g., wheelchair or bedside chair)?: None Help needed to walk in hospital room?: A Little Help needed climbing 3-5 steps with a railing? : A Little 6 Click Score: 22    End of Session   Activity Tolerance: Patient tolerated treatment well Patient left: in bed (seated on bed, spouse present) Nurse Communication: Mobility status      Time: 1010-1027 PT Time Calculation (min) (ACUTE ONLY): 17 min   Charges:   PT Evaluation $PT Eval Low Complexity: 1 Low   PT General Charges $$ ACUTE PT VISIT: 1 Visit        Riley Martin, PT, MPT  Riley Martin 02/24/2024, 12:43 PM

## 2024-02-24 NOTE — Plan of Care (Signed)
  Problem: Education: Goal: Knowledge of General Education information will improve Description: Including pain rating scale, medication(s)/side effects and non-pharmacologic comfort measures Outcome: Progressing   Problem: Activity: Goal: Risk for activity intolerance will decrease Outcome: Progressing   Problem: Nutrition: Goal: Adequate nutrition will be maintained Outcome: Progressing   Problem: Safety: Goal: Ability to remain free from injury will improve Outcome: Progressing   Problem: Skin Integrity: Goal: Risk for impaired skin integrity will decrease Outcome: Progressing   Problem: Safety: Goal: Ability to remain free from injury will improve Outcome: Progressing

## 2024-03-03 ENCOUNTER — Other Ambulatory Visit (INDEPENDENT_AMBULATORY_CARE_PROVIDER_SITE_OTHER): Payer: Self-pay | Admitting: Nurse Practitioner

## 2024-03-03 DIAGNOSIS — L819 Disorder of pigmentation, unspecified: Secondary | ICD-10-CM

## 2024-03-05 ENCOUNTER — Ambulatory Visit (INDEPENDENT_AMBULATORY_CARE_PROVIDER_SITE_OTHER): Payer: Self-pay

## 2024-03-05 ENCOUNTER — Encounter (INDEPENDENT_AMBULATORY_CARE_PROVIDER_SITE_OTHER): Payer: Self-pay | Admitting: Vascular Surgery

## 2024-03-05 ENCOUNTER — Ambulatory Visit (INDEPENDENT_AMBULATORY_CARE_PROVIDER_SITE_OTHER): Payer: No Typology Code available for payment source | Admitting: Vascular Surgery

## 2024-03-05 VITALS — BP 137/55 | HR 91 | Resp 18 | Ht 72.0 in | Wt 137.6 lb

## 2024-03-05 DIAGNOSIS — N1831 Chronic kidney disease, stage 3a: Secondary | ICD-10-CM

## 2024-03-05 DIAGNOSIS — L819 Disorder of pigmentation, unspecified: Secondary | ICD-10-CM

## 2024-03-05 DIAGNOSIS — E1122 Type 2 diabetes mellitus with diabetic chronic kidney disease: Secondary | ICD-10-CM

## 2024-03-05 DIAGNOSIS — I10 Essential (primary) hypertension: Secondary | ICD-10-CM | POA: Diagnosis not present

## 2024-03-05 DIAGNOSIS — I4891 Unspecified atrial fibrillation: Secondary | ICD-10-CM | POA: Diagnosis not present

## 2024-03-05 DIAGNOSIS — I442 Atrioventricular block, complete: Secondary | ICD-10-CM

## 2024-03-05 DIAGNOSIS — I70218 Atherosclerosis of native arteries of extremities with intermittent claudication, other extremity: Secondary | ICD-10-CM

## 2024-03-05 DIAGNOSIS — K219 Gastro-esophageal reflux disease without esophagitis: Secondary | ICD-10-CM

## 2024-03-05 DIAGNOSIS — J449 Chronic obstructive pulmonary disease, unspecified: Secondary | ICD-10-CM

## 2024-03-06 LAB — VAS US ABI WITH/WO TBI
Left ABI: 0.58
Right ABI: 0.93

## 2024-03-07 ENCOUNTER — Encounter (INDEPENDENT_AMBULATORY_CARE_PROVIDER_SITE_OTHER): Payer: Self-pay | Admitting: Vascular Surgery

## 2024-03-07 NOTE — Progress Notes (Signed)
 Subjective:    Patient ID: Riley Martin, male    DOB: 08-16-1945, 78 y.o.   MRN: [id number] Chief Complaint  Patient presents with   New Patient (Initial Visit)    Ref [place] consult mottled skin    Riley Martin is a 78 yo male who presents to clinic today as a new patient in a consult for mottled skin of his left lower extremity greater than his right lower extremity.  Patient presents to clinic today with his son at his side.  Patient endorses that his history includes COVID x 2 and since that time is left him very short of breath and unable to ambulate much.  Therefore he is unable to determine if he has any claudication symptoms upon ambulation.  When he does ambulate he can only ambulate about 25 to 30 feet but he feels this is due more to his breathing than any difficulty with his legs.  However he does endorse that he has intermittent claudication to his left lower extremity at rest but he states it does not bother him right now.  He does not have any swelling to his lower extremities.  Both lower extremity skin is not what I would see as mottled today but he has very light skin.  No varicose veins were noted.  He has no open sores or wounds.  Patient underwent bilateral lower extremity arterial duplex ultrasounds today.  Right ABI is 0.93 with biphasic waveforms.  No prior ABI findings. Left ABI is 0.58 with biphasic waveforms.  No prior ABI findings.    Review of Systems  Constitutional: Negative.   Respiratory:         Chronic shortness of breath due to COVID history x 2  Musculoskeletal:  Positive for myalgias.  Skin:  Positive for color change.  Neurological:  Positive for weakness.  All other systems reviewed and are negative.      Objective:   Physical Exam Constitutional:      Appearance: Normal appearance. He is ill-appearing.  HENT:     Head: Normocephalic.  Eyes:     Pupils: Pupils are equal, round, and reactive to light.  Cardiovascular:     Rate and  Rhythm: Normal rate. Rhythm irregular.     Pulses: Normal pulses.     Heart sounds: Normal heart sounds.  Pulmonary:     Effort: Pulmonary effort is normal.     Breath sounds: Normal breath sounds.  Abdominal:     General: Abdomen is flat. Bowel sounds are normal.     Palpations: Abdomen is soft.  Musculoskeletal:        General: Normal range of motion.  Skin:    General: Skin is warm and dry.     Capillary Refill: Capillary refill takes 2 to 3 seconds.  Neurological:     General: No focal deficit present.     Mental Status: He is alert and oriented to person, place, and time. Mental status is at baseline.  Psychiatric:        Mood and Affect: Mood normal.        Behavior: Behavior normal.        Thought Content: Thought content normal.        Judgment: Judgment normal.     BP (!) 137/55   Pulse 91   Resp 18   Ht 6' (1.829 m)   Wt 137 lb 9.6 oz (62.4 kg)   BMI 18.66 kg/m   Past Medical History:  Diagnosis  Date   A-fib [facility])    Arthritis    BPH (benign prostatic hyperplasia)    Cancer (HCC)    skin cancer   Chronic pain    COPD (chronic obstructive pulmonary disease) (HCC)    COVID    GERD (gastroesophageal reflux disease)    Headache    History of insomnia    IBS (irritable bowel syndrome)    Iron  deficiency anemia due to chronic blood loss 07/12/2021   Orthostatic dizziness    PONV (postoperative nausea and vomiting)    Spinal stenosis    Torn rotator cuff    left    Social History   Socioeconomic History   Marital status: Married    Spouse name: Not on file   Number of children: Not on file   Years of education: Not on file   Highest education level: Not on file  Occupational History   Not on file  Tobacco Use   Smoking status: Former    Current packs/day: 0.00    Average packs/day: 1 pack/day for 57.0 years (57.0 ttl pk-yrs)    Types: Cigarettes    Start date: 42    Quit date: 2020    Years since quitting: 5.8   Smokeless tobacco: Never   Vaping Use   Vaping status: Never Used  Substance and Sexual Activity   Alcohol use: No   Drug use: No   Sexual activity: Not on file  Other Topics Concern   Not on file  Social History Narrative   Not on file   Social Drivers of Health   Financial Resource Strain: Low Risk  (07/12/2023)   Received from [facility]   Overall Financial Resource Strain (CARDIA)    Difficulty of Paying Living Expenses: Not hard at all  Food Insecurity: No Food Insecurity (02/22/2024)   Hunger Vital Sign    Worried About Running Out of Food in the Last Year: Never true    Ran Out of Food in the Last Year: Never true  Transportation Needs: No Transportation Needs (02/22/2024)   PRAPARE - Administrator, Civil Service (Medical): No    Lack of Transportation (Non-Medical): No  Physical Activity: Not on file  Stress: Not on file  Social Connections: Patient Declined (02/22/2024)   Social Connection and Isolation Panel    Frequency of Communication with Friends and Family: Patient declined    Frequency of Social Gatherings with Friends and Family: Patient declined    Attends Religious Services: Patient declined    Active Member of Clubs or Organizations: Patient declined    Attends Banker Meetings: Patient declined    Marital Status: Patient declined  Intimate Partner Violence: Not At Risk (02/22/2024)   Humiliation, Afraid, Rape, and Kick questionnaire    Fear of Current or Ex-Partner: No    Emotionally Abused: No    Physically Abused: No    Sexually Abused: No    Past Surgical History:  Procedure Laterality Date   COLONOSCOPY     COLONOSCOPY WITH PROPOFOL  N/A 05/13/2021   Procedure: COLONOSCOPY WITH PROPOFOL ;  Surgeon: Therisa Bi, MD;  Location: [facility] ENDOSCOPY;  Service: Gastroenterology;  Laterality: N/A;   ESOPHAGOGASTRODUODENOSCOPY N/A 03/14/2021   Procedure: ESOPHAGOGASTRODUODENOSCOPY (EGD);  Surgeon: Onita Elspeth Sharper, DO;  Location: [facility]  ENDOSCOPY;  Service: Gastroenterology;  Laterality: N/A;   ESOPHAGOGASTRODUODENOSCOPY N/A 05/13/2021   Procedure: ESOPHAGOGASTRODUODENOSCOPY (EGD);  Surgeon: Therisa Bi, MD;  Location: [facility] ENDOSCOPY;  Service: Gastroenterology;  Laterality: N/A;  ESOPHAGOGASTRODUODENOSCOPY N/A 10/10/2023   Procedure: EGD (ESOPHAGOGASTRODUODENOSCOPY);  Surgeon: Toledo, Ladell POUR, MD;  Location: [facility] ENDOSCOPY;  Service: Gastroenterology;  Laterality: N/A;  Eliquis    EXCISION CHONCHA BULLOSA Bilateral 06/02/2015   Procedure: BILATERAL CHONCHA BULLOSA;  Surgeon: Carolee Hunter, MD;  Location: [facility];  Service: ENT;  Laterality: Bilateral;   HERNIA REPAIR     MAXILLARY ANTROSTOMY Bilateral 06/02/2015   Procedure: MAXILLARY ANTROSTOMY;  Surgeon: Carolee Hunter, MD;  Location: [facility];  Service: ENT;  Laterality: Bilateral;   NASAL POLYP SURGERY     NECK SURGERY  05/08/2006   no limitations   PACEMAKER LEADLESS INSERTION N/A 12/06/2023   Procedure: PACEMAKER LEADLESS INSERTION;  Surgeon: Ammon Blunt, MD;  Location: [facility] INVASIVE CV LAB;  Service: Cardiovascular;  Laterality: N/A;   UPPER GASTROINTESTINAL ENDOSCOPY      History reviewed. No pertinent family history.  Allergies  Allergen Reactions   Iodinated Contrast Media Other (See Comments) and Nausea Only    Dry heaves, patient states he felt like he was on fire and about to pass out.  Pre-syncope, burning  Dry heaves, patient states he felt like he was on fire and about to pass out.    Lisinopril  Swelling    Tongue swelling, neck pain and Headaches    Tramadol Other (See Comments) and Shortness Of Breath    Other reaction(s): Other (See Comments) Other Reaction: tachy Dizziness and unsteady gait.   Pregabalin Palpitations    Other reaction(s): Other (See Comments) Other Reaction: edema       Latest Ref Rng & Units 02/23/2024    6:09 AM 02/22/2024    2:17 PM 02/21/2024    5:30 PM  CBC  WBC 4.0 - 10.5 K/uL  6.1  6.1  6.4   Hemoglobin 13.0 - 17.0 g/dL 8.7  8.7  9.2   Hematocrit 39.0 - 52.0 % 26.8  27.2  28.9   Platelets 150 - 400 K/uL 199  194  220       CMP     Component Value Date/Time   NA 138 02/24/2024 0511   NA 140 06/14/2012 0656   K 4.5 02/24/2024 0511   K 3.9 06/14/2012 0656   CL 107 02/24/2024 0511   CL 103 06/14/2012 0656   CO2 27 02/24/2024 0511   CO2 31 06/14/2012 0656   GLUCOSE 88 02/24/2024 0511   GLUCOSE 91 06/14/2012 0656   BUN 38 (H) 02/24/2024 0511   BUN 22 (H) 06/14/2012 0656   CREATININE 3.15 (H) 02/24/2024 0511   CREATININE 1.24 06/14/2012 0656   CALCIUM 8.5 (L) 02/24/2024 0511   CALCIUM 8.6 06/14/2012 0656   PROT 7.5 02/21/2024 1730   ALBUMIN  3.0 (L) 02/24/2024 0511   AST 19 02/21/2024 1730   ALT 9 02/21/2024 1730   ALKPHOS 44 02/21/2024 1730   BILITOT 0.5 02/21/2024 1730   GFRNONAA 19 (L) 02/24/2024 0511   GFRNONAA 59 (L) 06/14/2012 0656     VAS US  ABI WITH/WO TBI Result Date: 03/06/2024  LOWER EXTREMITY DOPPLER STUDY Patient Name:  Riley Martin  Date of Exam:   03/05/2024 Medical Rec #: [id number]           Accession #:    [id number] Date of Birth: February 16, 1946           Patient Gender: M Patient Age:   88 years Exam Location:  [facility] Procedure:      VAS US  ABI WITH/WO TBI Referring Phys: Selinda Gu --------------------------------------------------------------------------------  Indications: Mottled Skin  Performing Technologist: Leafy Gibes RVS  Examination Guidelines: A complete evaluation includes at minimum, Doppler waveform signals and systolic blood pressure reading at the level of bilateral brachial, anterior tibial, and posterior tibial arteries, when vessel segments are accessible. Bilateral testing is considered an integral part of a complete examination. Photoelectric Plethysmograph (PPG) waveforms and toe systolic pressure readings are included as required and additional duplex testing as needed. Limited examinations for  reoccurring indications may be performed as noted.  ABI Findings: +---------+------------------+-----+--------+--------+ Right    Rt Pressure (mmHg)IndexWaveformComment  +---------+------------------+-----+--------+--------+ Brachial 144                                     +---------+------------------+-----+--------+--------+ ATA      134               0.93 biphasic         +---------+------------------+-----+--------+--------+ PTA      129               0.90 biphasic         +---------+------------------+-----+--------+--------+ Great Toe177               1.23 Normal           +---------+------------------+-----+--------+--------+ +---------+------------------+-----+--------+-------+ Left     Lt Pressure (mmHg)IndexWaveformComment +---------+------------------+-----+--------+-------+ Brachial 135                                    +---------+------------------+-----+--------+-------+ ATA      84                0.58 biphasic        +---------+------------------+-----+--------+-------+ PTA      73                0.51 biphasic        +---------+------------------+-----+--------+-------+ Great Toe179               1.24 Normal          +---------+------------------+-----+--------+-------+ +-------+-----------+-----------+------------+------------+ ABI/TBIToday's ABIToday's TBIPrevious ABIPrevious TBI +-------+-----------+-----------+------------+------------+ Right  .93        1.23                                +-------+-----------+-----------+------------+------------+ Left   .58        1.24                                +-------+-----------+-----------+------------+------------+  Summary: Right: Resting right ankle-brachial index indicates mild right lower extremity arterial disease. The right toe-brachial index is normal.  Left: Resting left ankle-brachial index indicates moderate left lower extremity arterial disease. The left  toe-brachial index is normal.  *See table(s) above for measurements and observations.  Electronically signed by Selinda Gu MD on 03/06/2024 at 8:08:12 AM.    Final        Assessment & Plan:   1. 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  Given the severity of the patient's severe left lower extremity symptoms the patient should undergo angiography with the hope for intervention.  Risk and benefits  were reviewed the patient.  Indications for the procedure were reviewed.  All questions were answered, the patient agrees to proceed with left lower extremity angiography and possible intervention.   The patient should continue walking and begin a more formal exercise program.  The patient should continue antiplatelet therapy and aggressive treatment of the lipid abnormalities  Patient wishes to discuss his current condition and the need for angiogram with his wife when he gets home.  He will call us  and let us  know whether or not he would like to proceed.  In the interim I will schedule him for 52-month follow-up with repeat arterial duplex ultrasounds of bilateral lower extremities with ABIs to continue to monitor his left lower claudication.  The patient will follow up with me after the angiogram.   2. Atrial fibrillation with rapid ventricular response (HCC) Continue antiarrhythmia medications as already ordered, these medications have been reviewed and there are no changes at this time.  Continue anticoagulation as ordered by Cardiology Service  3. Primary hypertension Continue antihypertensive medications as already ordered, these medications have been reviewed and there are no changes at this time.  4. Complete heart block [facility]) Patient recently has had a pacemaker placed for complete  heart block.  Discussed in detail with the patient and the issues related to his pacemaker.  Patient was encouraged to follow-up with cardiology and the pacemaker manufacture with rep for continued surveillance.  5. Chronic obstructive pulmonary disease, unspecified COPD type (HCC) Continue pulmonary medications and aerosols as already ordered, these medications have been reviewed and there are no changes at this time.   6. Gastroesophageal reflux disease without esophagitis Continue PPI as already ordered, this medication has been reviewed and there are no changes at this time.  Avoidence of caffeine and alcohol  Moderate elevation of the head of the bed   7. Type 2 diabetes mellitus with stage 3a chronic kidney disease, unspecified whether long term insulin  use (HCC) Continue hypoglycemic medications as already ordered, these medications have been reviewed and there are no changes at this time.  Hgb A1C to be monitored as already arranged by primary service   Current Outpatient Medications on File Prior to Visit  Medication Sig Dispense Refill   albuterol  (PROVENTIL ) (2.5 MG/3ML) 0.083% nebulizer solution Take 3 mLs by nebulization every 6 (six) hours as needed for wheezing or shortness of breath. 75 mL 0   amiodarone  (PACERONE ) 200 MG tablet Take 1 tablet (200 mg total) by mouth daily. Reduced from twice daily.  Home med.     apixaban  (ELIQUIS ) 2.5 MG TABS tablet Take by mouth.     budesonide  (PULMICORT ) 1 MG/2ML nebulizer solution Take 1 mg by nebulization daily.     Cyanocobalamin  (B-12) 2500 MCG SUBL Place 1 tablet under the tongue daily. 90 tablet 1   fluticasone  (FLONASE ) 50 MCG/ACT nasal spray SPRAY 2 SPRAYS INTO EACH NOSTRIL EVERY DAY 16 g 0   [Paused] furosemide  (LASIX ) 20 MG tablet Take 1 tablet (20 mg total) by mouth 2 (two) times daily. Resume it after checking kidney function and after discussing with primary care doctor 30 tablet    hydrocortisone  2.5 % cream Apply  topically.     ipratropium (ATROVENT ) 0.02 % nebulizer solution Take 0.5 mg by nebulization 2 (two) times daily.     mirtazapine  (REMERON ) 15 MG tablet Take 15 mg by mouth at bedtime.     mometasone -formoterol  (DULERA ) 200-5 MCG/ACT AERO Inhale 2 puffs into the lungs 2 (two) times daily.  1 each 1   Multiple Vitamin (MULTIVITAMIN WITH MINERALS) TABS tablet Take 1 tablet by mouth daily.     naloxone  (NARCAN ) nasal spray 4 mg/0.1 mL Place 0.4 mg into the nose once. CALL 911. SPR CONTENTS OF ONE SPRAYER (0.1ML) INTO ONE NOSTRIL. REPEAT IN 2-3 MIN IF SYMPTOMS OF OPIOID EMERGENCY PERSIST, ALTERNATE NOSTRILS 11/22/2021     Nutritional Supplements (FEEDING SUPPLEMENT, NEPRO CARB STEADY,) LIQD Take 237 mLs by mouth 3 (three) times daily between meals.  0   ondansetron  (ZOFRAN -ODT) 4 MG disintegrating tablet Take 1 tablet (4 mg total) by mouth every 8 (eight) hours as needed for nausea or vomiting. 30 tablet 0   oxyCODONE -acetaminophen  (PERCOCET) 10-325 MG tablet Take 1 tablet by mouth 5 (five) times daily as needed.     OXYCONTIN  10 MG 12 hr tablet Take 10 mg by mouth 3 (three) times daily.     OXYGEN Inhale 2 L into the lungs at bedtime as needed.     sodium chloride  (OCEAN) 0.65 % SOLN nasal spray Place 1 spray into both nostrils as needed for congestion. 30 mL 1   [Paused] spironolactone (ALDACTONE) 25 MG tablet Take 25 mg by mouth daily.     tamsulosin  (FLOMAX ) 0.4 MG CAPS capsule Take 0.4 mg by mouth daily.     VENTOLIN  HFA 108 (90 Base) MCG/ACT inhaler Inhale 2 puffs into the lungs every 6 (six) hours as needed.     No current facility-administered medications on file prior to visit.    There are no Patient Instructions on file for this visit. No follow-ups on file.   Gwendlyn JONELLE Shank, NP

## 2024-04-09 ENCOUNTER — Inpatient Hospital Stay: Attending: Oncology

## 2024-04-09 ENCOUNTER — Other Ambulatory Visit: Payer: Self-pay

## 2024-04-09 DIAGNOSIS — J449 Chronic obstructive pulmonary disease, unspecified: Secondary | ICD-10-CM | POA: Insufficient documentation

## 2024-04-09 DIAGNOSIS — Z79899 Other long term (current) drug therapy: Secondary | ICD-10-CM | POA: Insufficient documentation

## 2024-04-09 DIAGNOSIS — I4891 Unspecified atrial fibrillation: Secondary | ICD-10-CM | POA: Insufficient documentation

## 2024-04-09 DIAGNOSIS — I129 Hypertensive chronic kidney disease with stage 1 through stage 4 chronic kidney disease, or unspecified chronic kidney disease: Secondary | ICD-10-CM | POA: Diagnosis not present

## 2024-04-09 DIAGNOSIS — N1832 Anemia in chronic kidney disease: Secondary | ICD-10-CM

## 2024-04-09 DIAGNOSIS — R0602 Shortness of breath: Secondary | ICD-10-CM | POA: Diagnosis not present

## 2024-04-09 DIAGNOSIS — K219 Gastro-esophageal reflux disease without esophagitis: Secondary | ICD-10-CM | POA: Diagnosis not present

## 2024-04-09 DIAGNOSIS — D631 Anemia in chronic kidney disease: Secondary | ICD-10-CM | POA: Insufficient documentation

## 2024-04-09 LAB — RETIC PANEL
Immature Retic Fract: 11.3 % (ref 2.3–15.9)
RBC.: 2.61 MIL/uL — ABNORMAL LOW (ref 4.22–5.81)
Retic Count, Absolute: 39.9 K/uL (ref 19.0–186.0)
Retic Ct Pct: 1.5 % (ref 0.4–3.1)
Reticulocyte Hemoglobin: 35.7 pg (ref >27.9)

## 2024-04-09 LAB — IRON AND TIBC
Iron: 70 ug/dL (ref 45–182)
Saturation Ratios: 27 % (ref 17.9–39.5)
TIBC: 253 ug/dL (ref 250–450)
UIBC: 184 ug/dL

## 2024-04-09 LAB — CBC WITH DIFFERENTIAL (CANCER CENTER ONLY)
Abs Immature Granulocytes: 0.02 K/uL (ref 0.00–0.07)
Basophils Absolute: 0 K/uL (ref 0.0–0.1)
Basophils Relative: 1 %
Eosinophils Absolute: 0.4 K/uL (ref 0.0–0.5)
Eosinophils Relative: 8 %
HCT: 27 % — ABNORMAL LOW (ref 39.0–52.0)
Hemoglobin: 8.7 g/dL — ABNORMAL LOW (ref 13.0–17.0)
Immature Granulocytes: 0 %
Lymphocytes Relative: 30 %
Lymphs Abs: 1.5 K/uL (ref 0.7–4.0)
MCH: 32.7 pg (ref 26.0–34.0)
MCHC: 32.2 g/dL (ref 30.0–36.0)
MCV: 101.5 fL — ABNORMAL HIGH (ref 80.0–100.0)
Monocytes Absolute: 0.4 K/uL (ref 0.1–1.0)
Monocytes Relative: 9 %
Neutro Abs: 2.5 K/uL (ref 1.7–7.7)
Neutrophils Relative %: 52 %
Platelet Count: 207 K/uL (ref 150–400)
RBC: 2.66 MIL/uL — ABNORMAL LOW (ref 4.22–5.81)
RDW: 12.8 % (ref 11.5–15.5)
WBC Count: 4.9 K/uL (ref 4.0–10.5)
nRBC: 0 % (ref 0.0–0.2)

## 2024-04-09 LAB — PRO BRAIN NATRIURETIC PEPTIDE: Pro Brain Natriuretic Peptide: 656 pg/mL — ABNORMAL HIGH (ref <300.0)

## 2024-04-09 LAB — FERRITIN: Ferritin: 226 ng/mL (ref 24–336)

## 2024-04-10 LAB — KAPPA/LAMBDA LIGHT CHAINS
Kappa free light chain: 262 mg/L — ABNORMAL HIGH (ref 3.3–19.4)
Kappa, lambda light chain ratio: 7.36 — ABNORMAL HIGH (ref 0.26–1.65)
Lambda free light chains: 35.6 mg/L — ABNORMAL HIGH (ref 5.7–26.3)

## 2024-04-11 LAB — MULTIPLE MYELOMA PANEL, SERUM
Albumin SerPl Elph-Mcnc: 3.5 g/dL (ref 2.9–4.4)
Albumin/Glob SerPl: 1.3 (ref 0.7–1.7)
Alpha 1: 0.3 g/dL (ref 0.0–0.4)
Alpha2 Glob SerPl Elph-Mcnc: 0.7 g/dL (ref 0.4–1.0)
B-Globulin SerPl Elph-Mcnc: 0.8 g/dL (ref 0.7–1.3)
Gamma Glob SerPl Elph-Mcnc: 0.9 g/dL (ref 0.4–1.8)
Globulin, Total: 2.7 g/dL (ref 2.2–3.9)
IgA: 83 mg/dL (ref 61–437)
IgG (Immunoglobin G), Serum: 735 mg/dL (ref 603–1613)
IgM (Immunoglobulin M), Srm: 411 mg/dL — ABNORMAL HIGH (ref 15–143)
M Protein SerPl Elph-Mcnc: 0.3 g/dL — ABNORMAL HIGH
Total Protein ELP: 6.2 g/dL (ref 6.0–8.5)

## 2024-04-18 ENCOUNTER — Inpatient Hospital Stay: Admitting: Oncology

## 2024-04-18 NOTE — Assessment & Plan Note (Deleted)
 IgG kappa MGUS Lab Results  Component Value Date   MPROTEIN 0.3 (H) 04/09/2024   KPAFRELGTCHN 262.0 (H) 04/09/2024   LAMBDASER 35.6 (H) 04/09/2024   KAPLAMBRATIO 7.36 (H) 04/09/2024  Stable M protein in the light chain ratio. I recommend observation.  check NT BNP, TSH -patient will like to defer additional blood testing to the next visit.

## 2024-04-18 NOTE — Assessment & Plan Note (Deleted)
#  Iron  deficiency anemia, anemia secondary to chronic kidney disease. Labs reviewed and discussed with patient.   Lab Results  Component Value Date   HGB 8.7 (L) 04/09/2024   TIBC 253 04/09/2024   IRONPCTSAT 27 04/09/2024   FERRITIN 226 04/09/2024     Hemoglobin stable. Continue monitor. Continue oral iron  supplementation

## 2024-04-25 ENCOUNTER — Other Ambulatory Visit: Payer: Self-pay | Admitting: Family Medicine

## 2024-04-25 ENCOUNTER — Ambulatory Visit
Admission: RE | Admit: 2024-04-25 | Discharge: 2024-04-25 | Disposition: A | Discharge status: Home / Self Care | Source: Ambulatory Visit | Attending: Family Medicine | Admitting: Family Medicine

## 2024-04-25 DIAGNOSIS — R112 Nausea with vomiting, unspecified: Secondary | ICD-10-CM

## 2024-04-25 DIAGNOSIS — R1084 Generalized abdominal pain: Secondary | ICD-10-CM | POA: Diagnosis present

## 2024-04-25 DIAGNOSIS — K5900 Constipation, unspecified: Secondary | ICD-10-CM | POA: Insufficient documentation

## 2024-05-21 ENCOUNTER — Emergency Department

## 2024-05-21 ENCOUNTER — Encounter: Payer: Self-pay | Admitting: Oncology

## 2024-05-21 ENCOUNTER — Inpatient Hospital Stay
Admission: EM | Admit: 2024-05-21 | Discharge: 2024-05-29 | DRG: 673 | Disposition: A | Discharge status: Home / Self Care | Attending: Internal Medicine | Admitting: Internal Medicine

## 2024-05-21 DIAGNOSIS — Z888 Allergy status to other drugs, medicaments and biological substances status: Secondary | ICD-10-CM

## 2024-05-21 DIAGNOSIS — Z79891 Long term (current) use of opiate analgesic: Secondary | ICD-10-CM

## 2024-05-21 DIAGNOSIS — D509 Iron deficiency anemia, unspecified: Secondary | ICD-10-CM | POA: Diagnosis present

## 2024-05-21 DIAGNOSIS — Z91041 Radiographic dye allergy status: Secondary | ICD-10-CM

## 2024-05-21 DIAGNOSIS — E1151 Type 2 diabetes mellitus with diabetic peripheral angiopathy without gangrene: Secondary | ICD-10-CM | POA: Diagnosis present

## 2024-05-21 DIAGNOSIS — Z681 Body mass index (BMI) 19 or less, adult: Secondary | ICD-10-CM

## 2024-05-21 DIAGNOSIS — K5521 Angiodysplasia of colon with hemorrhage: Secondary | ICD-10-CM | POA: Diagnosis present

## 2024-05-21 DIAGNOSIS — Z7951 Long term (current) use of inhaled steroids: Secondary | ICD-10-CM

## 2024-05-21 DIAGNOSIS — G894 Chronic pain syndrome: Secondary | ICD-10-CM | POA: Diagnosis present

## 2024-05-21 DIAGNOSIS — Z885 Allergy status to narcotic agent status: Secondary | ICD-10-CM

## 2024-05-21 DIAGNOSIS — Z85828 Personal history of other malignant neoplasm of skin: Secondary | ICD-10-CM

## 2024-05-21 DIAGNOSIS — N186 End stage renal disease: Secondary | ICD-10-CM | POA: Diagnosis present

## 2024-05-21 DIAGNOSIS — E1143 Type 2 diabetes mellitus with diabetic autonomic (poly)neuropathy: Secondary | ICD-10-CM | POA: Diagnosis present

## 2024-05-21 DIAGNOSIS — D539 Nutritional anemia, unspecified: Secondary | ICD-10-CM | POA: Diagnosis present

## 2024-05-21 DIAGNOSIS — J449 Chronic obstructive pulmonary disease, unspecified: Secondary | ICD-10-CM | POA: Diagnosis present

## 2024-05-21 DIAGNOSIS — N400 Enlarged prostate without lower urinary tract symptoms: Secondary | ICD-10-CM | POA: Diagnosis present

## 2024-05-21 DIAGNOSIS — Z87891 Personal history of nicotine dependence: Secondary | ICD-10-CM

## 2024-05-21 DIAGNOSIS — M4800 Spinal stenosis, site unspecified: Secondary | ICD-10-CM | POA: Diagnosis present

## 2024-05-21 DIAGNOSIS — N200 Calculus of kidney: Secondary | ICD-10-CM | POA: Diagnosis present

## 2024-05-21 DIAGNOSIS — K581 Irritable bowel syndrome with constipation: Secondary | ICD-10-CM | POA: Diagnosis present

## 2024-05-21 DIAGNOSIS — E1122 Type 2 diabetes mellitus with diabetic chronic kidney disease: Secondary | ICD-10-CM | POA: Diagnosis present

## 2024-05-21 DIAGNOSIS — N179 Acute kidney failure, unspecified: Principal | ICD-10-CM | POA: Diagnosis present

## 2024-05-21 DIAGNOSIS — N189 Chronic kidney disease, unspecified: Secondary | ICD-10-CM

## 2024-05-21 DIAGNOSIS — N2581 Secondary hyperparathyroidism of renal origin: Secondary | ICD-10-CM | POA: Diagnosis present

## 2024-05-21 DIAGNOSIS — Z9981 Dependence on supplemental oxygen: Secondary | ICD-10-CM

## 2024-05-21 DIAGNOSIS — I442 Atrioventricular block, complete: Secondary | ICD-10-CM | POA: Diagnosis present

## 2024-05-21 DIAGNOSIS — D631 Anemia in chronic kidney disease: Principal | ICD-10-CM | POA: Diagnosis present

## 2024-05-21 DIAGNOSIS — K3184 Gastroparesis: Secondary | ICD-10-CM | POA: Diagnosis present

## 2024-05-21 DIAGNOSIS — J9611 Chronic respiratory failure with hypoxia: Secondary | ICD-10-CM | POA: Diagnosis present

## 2024-05-21 DIAGNOSIS — J441 Chronic obstructive pulmonary disease with (acute) exacerbation: Secondary | ICD-10-CM | POA: Diagnosis present

## 2024-05-21 DIAGNOSIS — G8929 Other chronic pain: Secondary | ICD-10-CM | POA: Diagnosis present

## 2024-05-21 DIAGNOSIS — Z862 Personal history of diseases of the blood and blood-forming organs and certain disorders involving the immune mechanism: Secondary | ICD-10-CM

## 2024-05-21 DIAGNOSIS — Z992 Dependence on renal dialysis: Secondary | ICD-10-CM

## 2024-05-21 DIAGNOSIS — E43 Unspecified severe protein-calorie malnutrition: Secondary | ICD-10-CM | POA: Diagnosis present

## 2024-05-21 DIAGNOSIS — Z79899 Other long term (current) drug therapy: Secondary | ICD-10-CM

## 2024-05-21 DIAGNOSIS — I480 Paroxysmal atrial fibrillation: Secondary | ICD-10-CM | POA: Diagnosis present

## 2024-05-21 DIAGNOSIS — E1129 Type 2 diabetes mellitus with other diabetic kidney complication: Secondary | ICD-10-CM | POA: Diagnosis present

## 2024-05-21 DIAGNOSIS — E875 Hyperkalemia: Secondary | ICD-10-CM | POA: Diagnosis present

## 2024-05-21 DIAGNOSIS — D649 Anemia, unspecified: Secondary | ICD-10-CM | POA: Diagnosis not present

## 2024-05-21 DIAGNOSIS — Z7901 Long term (current) use of anticoagulants: Secondary | ICD-10-CM

## 2024-05-21 DIAGNOSIS — K219 Gastro-esophageal reflux disease without esophagitis: Secondary | ICD-10-CM | POA: Diagnosis present

## 2024-05-21 DIAGNOSIS — Z8616 Personal history of COVID-19: Secondary | ICD-10-CM

## 2024-05-21 DIAGNOSIS — R112 Nausea with vomiting, unspecified: Secondary | ICD-10-CM | POA: Diagnosis present

## 2024-05-21 DIAGNOSIS — Z950 Presence of cardiac pacemaker: Secondary | ICD-10-CM

## 2024-05-21 LAB — COMPREHENSIVE METABOLIC PANEL WITH GFR
ALT: 9 U/L (ref 0–44)
AST: 14 U/L — ABNORMAL LOW (ref 15–41)
Albumin: 4.1 g/dL (ref 3.5–5.0)
Alkaline Phosphatase: 63 U/L (ref 38–126)
Anion gap: 10 (ref 5–15)
BUN: 58 mg/dL — ABNORMAL HIGH (ref 8–23)
CO2: 25 mmol/L (ref 22–32)
Calcium: 9 mg/dL (ref 8.9–10.3)
Chloride: 101 mmol/L (ref 98–111)
Creatinine, Ser: 5.12 mg/dL — ABNORMAL HIGH (ref 0.61–1.24)
GFR, Estimated: 11 mL/min — ABNORMAL LOW
Glucose, Bld: 134 mg/dL — ABNORMAL HIGH (ref 70–99)
Potassium: 5.5 mmol/L — ABNORMAL HIGH (ref 3.5–5.1)
Sodium: 135 mmol/L (ref 135–145)
Total Bilirubin: 0.2 mg/dL (ref 0.0–1.2)
Total Protein: 6.7 g/dL (ref 6.5–8.1)

## 2024-05-21 LAB — CBC
HCT: 24.6 % — ABNORMAL LOW (ref 39.0–52.0)
Hemoglobin: 7.8 g/dL — ABNORMAL LOW (ref 13.0–17.0)
MCH: 33.9 pg (ref 26.0–34.0)
MCHC: 31.7 g/dL (ref 30.0–36.0)
MCV: 107 fL — ABNORMAL HIGH (ref 80.0–100.0)
Platelets: 163 K/uL (ref 150–400)
RBC: 2.3 MIL/uL — ABNORMAL LOW (ref 4.22–5.81)
RDW: 12.5 % (ref 11.5–15.5)
WBC: 5.4 K/uL (ref 4.0–10.5)
nRBC: 0 % (ref 0.0–0.2)

## 2024-05-21 LAB — TYPE AND SCREEN
ABO/RH(D): A POS
Antibody Screen: NEGATIVE

## 2024-05-21 MED ORDER — METOCLOPRAMIDE HCL 5 MG/ML IJ SOLN
10.0000 mg | Freq: Once | INTRAMUSCULAR | Status: AC
  Administered 2024-05-21: 10 mg via INTRAVENOUS
  Filled 2024-05-21: qty 2

## 2024-05-21 MED ORDER — PATIROMER SORBITEX CALCIUM 8.4 G PO PACK
25.2000 g | PACK | Freq: Once | ORAL | Status: AC
  Administered 2024-05-22: 25.2 g via ORAL
  Filled 2024-05-21: qty 3

## 2024-05-21 MED ORDER — PATIROMER SORBITEX CALCIUM 8.4 G PO PACK
8.4000 g | PACK | Freq: Every day | ORAL | Status: DC
  Administered 2024-05-22 – 2024-05-24 (×2): 8.4 g via ORAL
  Filled 2024-05-21 (×3): qty 1

## 2024-05-21 MED ORDER — SODIUM CHLORIDE 0.9 % IV BOLUS
500.0000 mL | Freq: Once | INTRAVENOUS | Status: AC
  Administered 2024-05-21: 500 mL via INTRAVENOUS

## 2024-05-21 NOTE — Assessment & Plan Note (Signed)
 Continue tamsulosin .

## 2024-05-21 NOTE — Assessment & Plan Note (Addendum)
 Abdominal pain IBS-constipation History of gastritis on EGD 10/2023 Possibly secondary to gastritis-seen on EGD June 2025 CT abdomen and pelvis showing large stool burden, constipation but otherwise nonacute IV antiemetics, IV Protonix  Daily MiraLAX  Oral lactulose  x 2 doses Clear liquid diet advance as tolerated Can consider GI consult

## 2024-05-21 NOTE — Assessment & Plan Note (Signed)
 Chronic respiratory failure Not acutely exacerbated Continue home inhalers DuoNebs as needed

## 2024-05-21 NOTE — Assessment & Plan Note (Signed)
 Sliding scale insulin  coverage

## 2024-05-21 NOTE — Hospital Course (Signed)
 Riley Martin

## 2024-05-21 NOTE — Assessment & Plan Note (Signed)
 No acute issues suspected

## 2024-05-21 NOTE — ED Provider Notes (Signed)
 "   [facility] Emergency Department Provider Note     Event Date/Time   First MD Initiated Contact with Patient 05/21/24 1924     (approximate)   History   abnormal labs   HPI  Riley Martin is a 79 y.o. male with a history of COPD, GERD, A-fib on Eliquis , gastroparesis, BPH, and IBS, who presents to the ED via EMS from his nephrology office.  Patient has had 3 weeks of intermittent episodes of nausea, vomiting, and diarrhea.  He would note for the last 10 days he has had what he describes as dark-appearing stools.  He endorses generalized fatigue as well as body aches.  No reports of any frank fevers, chills, or sweats. His last dose of Eliquis  was last night. No home meds have been taken today.  He was being evaluated by his nephrologist, when routine labs today, revealed a decrease in his baseline H&H.  He was advised to report to the ED for further evaluation.  Patient denies any falls, head injury, headaches, hematemesis, or coffee-ground emesis.  He did have 1 episode of bright red blood per rectum, after passing a firm stool few days ago.  Patient did also report that he has had some increased urinary urgency complicated by intermittent urinary retention.  Chart review reveals the patient had an admission back in October 2025, for generalized weakness and AKI.   Physical Exam   Triage Vital Signs: ED Triage Vitals [05/21/24 1452]  Encounter Vitals Group     BP (!) 117/53     Girls Systolic BP Percentile      Girls Diastolic BP Percentile      Boys Systolic BP Percentile      Boys Diastolic BP Percentile      Pulse Rate 65     Resp 18     Temp 99.1 F (37.3 C)     Temp Source Oral     SpO2 100 %     Weight 137 lb (62.1 kg)     Height 6' (1.829 m)     Head Circumference      Peak Flow      Pain Score 9     Pain Loc      Pain Education      Exclude from Growth Chart     Most recent vital signs: Vitals:   05/21/24 1452 05/21/24 2033   BP: (!) 117/53 (!) 132/51  Pulse: 65 62  Resp: 18 19  Temp: 99.1 F (37.3 C) 98.1 F (36.7 C)  SpO2: 100% 100%    General Awake, no distress. NAD HEENT NCAT. PERRL. EOMI. No rhinorrhea. Mucous membranes are moist.  CV:  Good peripheral perfusion. RRR RESP:  Normal effort. CTA ABD:  No distention.  Nontender.  No rebound, guarding, or rigidity noted.  Normal DRE.  Normal rectal tone appreciated.  Heme-negative stool guaiac   ED Results / Procedures / Treatments   Labs (all labs ordered are listed, but only abnormal results are displayed) Labs Reviewed  COMPREHENSIVE METABOLIC PANEL WITH GFR - Abnormal; Notable for the following components:      Result Value   Potassium 5.5 (*)    Glucose, Bld 134 (*)    BUN 58 (*)    Creatinine, Ser 5.12 (*)    AST 14 (*)    GFR, Estimated 11 (*)    All other components within normal limits  CBC - Abnormal; Notable for the following components:   RBC  2.30 (*)    Hemoglobin 7.8 (*)    HCT 24.6 (*)    MCV 107.0 (*)    All other components within normal limits  RETICULOCYTES  URINALYSIS, ROUTINE W REFLEX MICROSCOPIC  POC OCCULT BLOOD, ED  TYPE AND SCREEN    EKG   RADIOLOGY  I personally viewed and evaluated these images as part of my medical decision making, as well as reviewing the written report by the radiologist.  ED Provider Interpretation: Large stool burden throughout the colon.  Redemonstrated nonobstructive left kidney calculi  CT ABDOMEN PELVIS WO CONTRAST Result Date: 05/21/2024 CLINICAL DATA:  Nausea/vomiting/diarrhea for 3 weeks, dark stools, fatigue EXAM: CT ABDOMEN AND PELVIS WITHOUT CONTRAST TECHNIQUE: Multidetector CT imaging of the abdomen and pelvis was performed following the standard protocol without IV contrast. RADIATION DOSE REDUCTION: This exam was performed according to the departmental dose-optimization program which includes automated exposure control, adjustment of the mA and/or kV according to patient  size and/or use of iterative reconstruction technique. COMPARISON:  04/25/2024 FINDINGS: Lower chest: No acute pleural or parenchymal lung disease. Hepatobiliary: Unremarkable unenhanced appearance of the liver and gallbladder. Pancreas: Unremarkable unenhanced appearance. Spleen: Unremarkable unenhanced appearance. Adrenals/Urinary Tract: Nonobstructing 5 mm calculus lower pole right kidney. No right-sided calculi. No obstructive uropathy within either kidney. The adrenals and bladder are unremarkable. Stomach/Bowel: No bowel obstruction or ileus. Large amount of stool throughout the colon consistent with constipation. The appendix, if still present, is not well visualized. Scattered sigmoid diverticulosis without diverticulitis. No bowel wall thickening or inflammatory change. Vascular/Lymphatic: Aortic atherosclerosis. No enlarged abdominal or pelvic lymph nodes. Reproductive: Prostate is unremarkable. Other: No free fluid or free intraperitoneal gas. Small fat containing umbilical hernia. Musculoskeletal: No acute or destructive bony abnormalities. Reconstructed images demonstrate no additional findings. IMPRESSION: 1. Large amount of retained stool throughout the colon, consistent with constipation. No bowel obstruction or ileus. 2. Nonobstructing 5 mm left renal calculus. 3.  Aortic Atherosclerosis (ICD10-I70.0). Electronically Signed   By: Ozell Daring M.D.   On: 05/21/2024 20:27     PROCEDURES:  Critical Care performed: No  Procedures   MEDICATIONS ORDERED IN ED: Medications  metoCLOPramide  (REGLAN ) injection 10 mg (10 mg Intravenous Given 05/21/24 2037)  sodium chloride  0.9 % bolus 500 mL (0 mLs Intravenous Stopped 05/21/24 2130)     IMPRESSION / MDM / ASSESSMENT AND PLAN / ED COURSE  I reviewed the triage vital signs and the nursing notes.                              Differential diagnosis includes, but is not limited to, GI bleed, chronic anemia, iron  deficiency anemia, electrolyte  abnormality, colitis  Patient's presentation is most consistent with acute presentation with potential threat to life or bodily function.  Patient's diagnosis is consistent with chronic anemia of CKD and AKI.  Geriatric patient presents to the ED from his primary nephrology office, for evaluation of 3 weeks of intermittent abdominal pain with associated NVD.  Patient today found to have a H&H decreased to 7.8 / 24.6, down from his base last month of 8.7 / 27.  Patient endorsing poor p.o. intake, generalized weakness and fatigue.  No frank fevers, cough, or congestion reported.  He is additionally had some urinary retention intermittently, as well as some ongoing nausea.  No white count noted on exam.  AKI as evidenced Creatinine of 5.12 and a GFR of 11, which is increased from his  base which is 3.15 and 19.  It is unclear whether the patient's hemoglobin goal is above 8, but he otherwise is hemodynamically stable at this time.  Noncontrast CT abdomen pelvis, shows a moderate stool burden with no other abnormalities.  Patient t will be admitted to the hospital service for further evaluation and management.  He and his wife are agreeable to plan at this time.  FINAL CLINICAL IMPRESSION(S) / ED DIAGNOSES   Final diagnoses:  AKI (acute kidney injury)  Anemia due to chronic kidney disease, unspecified CKD stage     Rx / DC Orders   ED Discharge Orders     None        Note:  This document was prepared using Dragon voice recognition software and may include unintentional dictation errors.    Loyd Candida LULLA Aldona, PA-C 05/23/24 1523  "

## 2024-05-21 NOTE — Progress Notes (Signed)
 Follow Up Visit   Patient Name: Riley Martin, male   Patient DOB: 1945/11/19 Date of Service: 05/21/2024  Patient MRN: [id number] Provider Creating Note: Pinkey Edman, MD  [phone] Primary Care Physician:   [address] [place] [address] Additional Physicians/ Providers:    History of Present Illness Riley Martin is a 79 y.o. male now comes to the office for follow-up.  He has a past medical history of COPD on home oxygen/COVID-pneumonia, diabetes, peripheral vascular disease, hypertension, atrial fibrillation, coronary artery disease and congestive heart failure now comes for renal follow-up.   Patient says he has not been feeling well for the last few days. Since yesterday he has been having nausea and vomiting. He was not able to eat much of any food. He also gives history of some shortness of breath and dyspnea on exertion. He also gives history of dark stools for the last 10 days. His hemoglobin has gone down to 7.7. His creatinine has been worsening slowly but steadily with the creatinine of 5.0 and a GFR of 11.  Medications   Current Outpatient Medications:    albuterol  HFA (PROVENTIL  HFA;VENTOLIN  HFA) 108 (90 Base) MCG/ACT inhaler, INHALE 1 PUFF INTO THE LUNGS EVERY 6 HOURS AS NEEDED., Disp: , Rfl:    amiodarone  (PACERONE ) 200 MG tablet, Take 200 mg by mouth 1 (one) time each day, Disp: , Rfl:    apixaban  (ELIQUIS ) 2.5 MG tablet, Take 2.5 mg by mouth in the morning and 2.5 mg in the evening., Disp: , Rfl:    budesonide  (PULMICORT ) 0.5 MG/2ML nebulizer solution, Inhale 0.5 mg, Disp: , Rfl:    Cyanocobalamin  (VITAMIN B-12 PO), Take by mouth 1 (one) time each day, Disp: , Rfl:    fluticasone  (FLONASE ) 50 MCG/ACT nasal spray, Place 2 sprays into both nostrils once daily As needed, Disp: , Rfl:    furosemide  (LASIX ) 20 MG tablet, Take 10 mg by mouth in the morning and 10 mg in the evening., Disp: , Rfl:    ipratropium-albuterol  (DUO-NEB) 0.5-2.5 mg/3 mL nebulizer solution, Take  3 mLs by nebulization 2 (two) times daily for 360 days, Disp: , Rfl:    mometasone -formoterol  (Dulera ) 200-5 MCG/ACT inhaler, INHALE 2 INHALATIONS INTO THE LUNGS 2 TIMES DAILY, Disp: , Rfl:    Multiple Vitamins-Minerals (Multi Vitamin/Minerals) tablet, Take 1 tablet by mouth 1 (one) time each day, Disp: , Rfl:    Naloxone  HCl 4 MG/0.1ML liquid, Administer 0.4 mg into affected nostril(s), Disp: , Rfl:    ondansetron  ODT (ZOFRAN -ODT) 4 MG dispersible tablet, Take 4 mg by mouth, Disp: , Rfl:    oxyCODONE -acetaminophen  (PERCOCET) 10-325 MG per tablet, Take 1 tablet by mouth every 8 (eight) hours as needed, Disp: , Rfl:    OxyCONTIN  10 MG 12 hr abuse-deterrent tablet, Take 10 mg by mouth in the morning and 10 mg in the evening and 10 mg before bedtime., Disp: , Rfl:    oxygen (O2) gas, Inhale 3 L/min continuously via nasal canula, Disp: , Rfl:    sodium chloride  (OCEAN) 0.65 % nasal spray, Administer 1 spray into affected nostril(s), Disp: , Rfl:    tamsulosin  (FLOMAX ) 0.4 MG 24 hr capsule, TAKE 1 CAPSULE (0.4 MG TOTAL) BY MOUTH ONCE DAILY. TAKE 30 MINUTES AFTER SAME MEAL EACH DAY., Disp: , Rfl:    traZODone  (DESYREL ) 50 MG tablet, TAKE 0.5 TABLETS BY MOUTH AT BEDTIME AS NEEDED FOR SLEEP., Disp: , Rfl:    Allergies Iodinated contrast media, Lisinopril , Other, Tramadol, and Pregabalin  Problem  List Patient Active Problem List  Diagnosis   Hypertension   Stage 3b chronic kidney disease (HCC)   Type 2 diabetes mellitus with diabetic chronic kidney disease (HCC)   Anemia in chronic kidney disease   Proteinuria, not otherwise specified   Benign prostatic hyperplasia   Chronic obstructive pulmonary disease, not otherwise specified (HCC)   Hyperkalemia   Paroxysmal atrial fibrillation (HCC)   History of lower gastrointestinal bleed   COVID-19     Review of Systems  Constitutional:  Positive for malaise/fatigue.  HENT: Negative.    Respiratory:  Positive for shortness of  breath.   Cardiovascular: Negative.   Gastrointestinal:  Positive for abdominal pain, blood in stool, nausea and vomiting.  Genitourinary: Negative.   Musculoskeletal:  Positive for back pain.  Skin: Negative.   Neurological:  Positive for weakness.     History Past Medical History:  Diagnosis Date   Atrial fibrillation (HCC)    Chronic kidney disease stage 3 (HCC)    Esophageal reflux    Macrocytic anemia     Past Surgical History:  Procedure Laterality Date   CERVICAL FUSION     INGUINAL HERNIA REPAIR Left    Family History  Problem Relation Age of Onset   Heart disease Mother    Social History   Tobacco Use   Smoking status: Former    Current packs/day: 0.00    Average packs/day: 1 pack/day for 57.0 years (57.0 ttl pk-yrs)    Types: Cigarettes    Start date: 1963    Quit date: 2020    Years since quitting: 6.0   Smokeless tobacco: Never  Substance Use Topics   Alcohol use: Never     Physical Exam  Vitals BP 124/50 (BP Location: Right upper arm, Patient Position: Sitting)   Pulse 73   Temp 98.2 F   Wt 134 lb (60.8 kg)   SpO2 96%   BMI 18.17 kg/m   PHYSICAL EXAM: General appearance: well developed, well nourished, NAD Neck: Trachea midline; supple Lungs: CTAB, with normal respiratory effort  CV: S1S2, no murmurs or rubs. Abdomen: Soft, non-tender; bowel sounds present Extremities: No peripheral edema   Laboratory Studies   Labs  Lab Units 05/19/24 1458 04/25/24 1336 04/02/24 1441 03/04/24 1047 02/27/24 1353 02/21/24 1119 02/15/23 1105 11/01/22 1118  SODIUM mmol/L 135 137 136 138 139 141   < > 140  POTASSIUM mmol/L 5.1 4.5 4.8 5.1 5.1 4.6   < > 3.9  CHLORIDE mmol/L 100 101 100 105 105 103   < > 101  CO2 mmol/L 27 29.4 29.7 24 28.5 30.9   < > 30  BUN mg/dL 57* 48* 38* 43* 36* 56*   < > 35*  CREATININE mg/dL 4.90* 4.4* 3.5* 6.91* 3.5* 3.8*   < > 1.87*  EGFR mL/min/1.71m2 11* 13* 17* 20* 17* 15*   < > 36*  CALCIUM  mg/dL 8.6 8.7  8.8 8.6 8.9 9.2   < > 8.7  PHOSPHORUS mg/dL 5.1*  --   --  4.8*  --   --   --  3.8  ALBUMIN  g/dL 4.0 3.9 3.8 4.1  --  4.1   < > 3.9   < > = values in this interval not displayed.    CBC  Lab Units 05/19/24 1458 03/04/24 1047 11/01/22 1118  WBC AUTO Thousand/uL 5.3  --  7.0  HEMOGLOBIN g/dL 7.7*  --  86.4  HEMOGLOBIN URINE   --  NEGATIVE NEGATIVE  HEMATOCRIT % 23.5*  --  40.9  MCV fL 101.3  --  103.8*  PLATELETS AUTO Thousand/uL 166  --  162    Urine  Lab Units 03/04/24 1047 11/01/22 1118  COLOR U  YELLOW YELLOW  KETONES U MG/DL  NEGATIVE NEGATIVE  PROT/CREAT RATIO UR mg/g creat 0.415*  415* 0.189*  189*    Imaging and Other Studies   General abdominal pain.  Nausea and vomiting.   EXAM:  CT ABDOMEN AND PELVIS WITHOUT CONTRAST   TECHNIQUE:  Multidetector CT imaging of the abdomen and pelvis was performed  following the standard protocol without IV contrast.   RADIATION DOSE REDUCTION: This exam was performed according to the  departmental dose-optimization program which includes automated  exposure control, adjustment of the mA and/or kV according to  patient size and/or use of iterative reconstruction technique.   COMPARISON:  CT scan abdomen and pelvis from 02/22/2024.   FINDINGS:  Lower chest: The lung bases are clear. No pleural effusion. The  heart is normal in size. No pericardial effusion.   Hepatobiliary: The liver is normal in size. Non-cirrhotic  configuration. No suspicious mass. No intrahepatic or extrahepatic  bile duct dilation. No calcified gallstones. Normal gallbladder wall  thickness. No pericholecystic inflammatory changes.   Pancreas: Unremarkable. No pancreatic ductal dilatation or  surrounding inflammatory changes.   Spleen: Within normal limits. No focal lesion.   Adrenals/Urinary Tract: Adrenal glands are unremarkable. No  suspicious renal mass within the limitations of this unenhanced  exam. There is a 5 mm nonobstructing  calculus in the left kidney  lower pole calyx. No left ureterolithiasis or obstructive uropathy.  There is persistent mild fullness in the right renal collecting  system however, right pelvis and ureter are unremarkable. No right  nephroureterolithiasis or obstructive uropathy.   Unremarkable urinary bladder.   Stomach/Bowel: No disproportionate dilation of the small or large  bowel loops. No evidence of abnormal bowel wall thickening or  inflammatory changes. The appendix is unremarkable. There are  multiple diverticula mainly in the sigmoid colon, without imaging  signs of diverticulitis.   Vascular/Lymphatic: No ascites or pneumoperitoneum. No abdominal or  pelvic lymphadenopathy, by size criteria. No aneurysmal dilation of  the major abdominal arteries. There are moderate peripheral  atherosclerotic vascular calcifications of the aorta and its major  branches.   Reproductive: Normal size prostate. Slightly asymmetrically  prominent right seminal vesicle in comparison to the left, similar  to the prior study and of unknown clinical significance.   Other: There is a tiny fat containing umbilical hernia. The soft  tissues and abdominal wall are otherwise unremarkable.   Musculoskeletal: No suspicious osseous lesions. There are moderate  multilevel degenerative changes in the visualized spine.   IMPRESSION:  1. No acute inflammatory process identified within the abdomen or  pelvis.  2. There is a 5 mm nonobstructing calculus in the left kidney lower  pole calyx. No obstructive uropathy on either side.  3. Multiple other nonacute observations, as described above.   Aortic Atherosclerosis (ICD10-I70.0).    Electronically Signed    By: Ree Molt M.D.    On: 04/25/2024 15:03   Problem List Items Addressed This Visit     Hypertension - Primary   Stage 3b chronic kidney disease (HCC)   Type 2 diabetes mellitus with diabetic chronic kidney disease (HCC) (Chronic)    Anemia in chronic kidney disease (Chronic)   Proteinuria, not otherwise specified   Benign prostatic hyperplasia   Chronic obstructive pulmonary disease, not otherwise specified (HCC)  Hyperkalemia (Chronic)   Paroxysmal atrial fibrillation (HCC) (Chronic)   History of lower gastrointestinal bleed   COVID-19   No orders of the defined types were placed in this encounter.       Impression/Recommendations   Riley Martin is a 78 y.o. male with past medical history of COVID-pneumonia, COPD on home oxygen, insulin -dependent diabetes, peripheral vascular disease, history of smoking, atrial fibrillation, hypertension with coronary artery disease and congestive heart failure, chronic kidney disease with proteinuria now comes for renal follow-up..  With a history of acute kidney injury and a drop in hemoglobin with associated history of severe nausea, vomiting and shortness of breath.  Will will send the patient to the emergency room for evaluation and a possibility of initiating him on renal replacement therapy and GI evaluation.  Spoke to the patient and his wife in detail and they are willing to go to the emergency room for evaluation.  I also spoke to the patient's son over the telephone.   #1:  Acute kidney injury on chronic kidney disease: Patient had acute kidney injury recently with a creatinine of 5 and a GFR of 11 cc/min.  He will need to be initiated on renal replacement therapy and also needs workup for acute kidney injury on chronic kidney disease.   #2: Proteinuria: Proteinuria is most likely due to chronic diabetic kidney disease.  May benefit from addition of ARB/ACE inhibitor's but his potassium has been marginally high and has allergies to ACE inhibitors.   #3: Hypertension: Blood pressure is well controlled at this time.  Advised him to stay on 2 g salt restricted diet.       #4: Diabetes: Patient is advised on importance of strict blood sugar control.  He is now off of insulin .      #5: Chronic bronchitis/COPD/history of COVID. Patient is on home oxygen.  Presently off of prednisone .   #6: Secondary hyperparathyroidism: PTH, calcium  and phosphorus levels are within normal limits.     #7: Anemia: He has a history of lower GI bleed.  Hemoglobin is presently stable. Has been under the care of hematology and has been receiving iron  infusions.   #8: Congestive heart failure: Patient is advised on importance of 2 g salt restricted diet along with 1 L fluid restriction a day.  I advised to continue Furosemide  at 20 mg daily.    #9: Hyperkalemia: Patient is advised on importance of low potassium diets.  I advised him to continue the furosemide  at the present doses..   #10: BPH: I advised him to continue the Flomax  at the present doses.   I spoke to the patient and his wife at bedside in detail and answered all their questions to their satisfaction. I also spoke to the patient's son Prentice and advised him that we will be sending his father to the hospital for further evaluation. Advised on importance of 2 g salt restricted diet along with fluid restriction.   Dear Dr. Alla, Thank you for the opportunity to participate in the care of this very pleasant patient.    I discussed the assessment and treatment plan with the patient.  The patient was provided an opportunity to ask questions and all were answered.  The patient agreed with the plan and demonstrated an understanding of the instructions. The patient was advised to call back or seek an in-person evaluation if the symptoms worsen or if the condition fails to improve as anticipated.  Return in about 4 weeks (around 06/18/2024).  Disclaimer:  Much of the narrative of this dictation was acquired using speech recognition software. It is possible that some dictated speech was not transcribed accurately by this system nor detected in the proofing. Such errors are at times unavoidable.    Pinkey Edman, MD [facility] Ph: [phone] Fax: [phone] 05/21/2024

## 2024-05-21 NOTE — Assessment & Plan Note (Signed)
 Chronic pain.  Continue home meds

## 2024-05-21 NOTE — Assessment & Plan Note (Addendum)
 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   Patient symptomatic for generalized weakness and reported dark stool and 1 episode of bright red blood per rectum Suspect secondary to chronic kidney disease Stool guaiac in the ED was negative Serial H&H Transfuse if needed Nephrology consult

## 2024-05-21 NOTE — ED Provider Triage Note (Signed)
 Emergency Medicine Provider Triage Evaluation Note  Riley Martin , a 79 y.o. male  was evaluated in triage.  Pt complains of nausea for 3 weeks and last 10 days dark stools.  Reported hgb 7.1 from doctor's office.    Review of Systems  Positive: Nausea. Weakness. Dark stools.  + hx of gi bleed.  Negative: No diarrhea.  Physical Exam  BP (!) 117/53   Pulse 65   Temp 99.1 F (37.3 C) (Oral)   Resp 18   Ht 6' (1.829 m)   Wt 62.1 kg   SpO2 100%   BMI 18.58 kg/m  Gen:   Awake, no distress   talkative Resp:  Normal effort  MSK:   Moves extremities without difficulty  Other:    Medical Decision Making  Medically screening exam initiated at 2:55 PM.  Appropriate orders placed.  Riley Martin was informed that the remainder of the evaluation will be completed by another provider, this initial triage assessment does not replace that evaluation, and the importance of remaining in the ED until their evaluation is complete.     Riley Shona CROME, PA-C 05/21/24 1458

## 2024-05-21 NOTE — Assessment & Plan Note (Addendum)
 Suspect related to worsening renal function [place]  Insulin  and dextrose  and calcium  gluconate Continue to monitor Telemetry monitoring

## 2024-05-21 NOTE — Assessment & Plan Note (Signed)
 CT abdomen and pelvis showing large stool burden and constipation Daily MiraLAX  with Dulcolax as needed Trial of lactulose 

## 2024-05-21 NOTE — H&P (Signed)
 " History and Physical    Patient: Riley Martin DOB: [birth year] DOA: 05/21/2024 DOS: the patient was seen and examined on 05/21/2024 PCP: Alla Amis, MD  Patient coming from: Home  Chief Complaint:  Chief Complaint  Patient presents with   abnormal labs    HPI: Riley Martin is a 79 y.o. male with medical history significant for paroxysmal atrial fibrillation on Eliquis ,BPH, COPD,  IBS-C and spinal stenosis,  pacemaker, CKD stage IV, chronic anemia, sent from his nephrology office for admission for management of worsening of his CKD 4 and symptomatic anemia and worsening of his CKD.    Routine labs on 05/19/2024 that revealed hemoglobin of 7.7, down from baseline of 8.7 as well as creatinine of 5, up from 4.4 with a month prior.  Patient reports a 2-week history of abdominal bloating and cramping, intractable nausea and vomiting starting on the day of arrival.  He has noted dark stool and had 1 episode of bright red blood while straining to have a bowel movement.  He has associated generalized weakness.  Of note,Patient had an upper endoscopy in June 2025 that showed gastritis.  Biopsy was done and a gastric emptying study was ordered which was normal.  Last colonoscopy was in 2023. In the ED, vitals within normal limits.  Labs confirmed hemoglobin of 7.8, unchanged from labs on 1/12.  Creatinine now 5.12 with normal bicarb and hyperkalemia 5.5. Stool guaiac in the ED was negative CT abdomen and pelvis showed constipation and a nonobstructing 5 mm left renal calculus. Patient treated with an NS bolus, IV Reglan  for nausea Admission requested     Past Medical History:  Diagnosis Date   A-fib (HCC)    Arthritis    BPH (benign prostatic hyperplasia)    Cancer (HCC)    skin cancer   Chronic pain    COPD (chronic obstructive pulmonary disease) (HCC)    COVID    GERD (gastroesophageal reflux disease)    Headache    History of insomnia    IBS (irritable  bowel syndrome)    Iron  deficiency anemia due to chronic blood loss 07/12/2021   Orthostatic dizziness    PONV (postoperative nausea and vomiting)    Spinal stenosis    Torn rotator cuff    left   Past Surgical History:  Procedure Laterality Date   COLONOSCOPY     COLONOSCOPY WITH PROPOFOL  N/A 05/13/2021   Procedure: COLONOSCOPY WITH PROPOFOL ;  Surgeon: Therisa Bi, MD;  Location: [facility] ENDOSCOPY;  Service: Gastroenterology;  Laterality: N/A;   ESOPHAGOGASTRODUODENOSCOPY N/A 03/14/2021   Procedure: ESOPHAGOGASTRODUODENOSCOPY (EGD);  Surgeon: Onita Elspeth Sharper, DO;  Location: [facility] ENDOSCOPY;  Service: Gastroenterology;  Laterality: N/A;   ESOPHAGOGASTRODUODENOSCOPY N/A 05/13/2021   Procedure: ESOPHAGOGASTRODUODENOSCOPY (EGD);  Surgeon: Therisa Bi, MD;  Location: [facility] ENDOSCOPY;  Service: Gastroenterology;  Laterality: N/A;   ESOPHAGOGASTRODUODENOSCOPY N/A 10/10/2023   Procedure: EGD (ESOPHAGOGASTRODUODENOSCOPY);  Surgeon: Toledo, Ladell POUR, MD;  Location: [facility] ENDOSCOPY;  Service: Gastroenterology;  Laterality: N/A;  Eliquis    EXCISION CHONCHA BULLOSA Bilateral 06/02/2015   Procedure: BILATERAL CHONCHA BULLOSA;  Surgeon: Carolee Hunter, MD;  Location: [facility];  Service: ENT;  Laterality: Bilateral;   HERNIA REPAIR     MAXILLARY ANTROSTOMY Bilateral 06/02/2015   Procedure: MAXILLARY ANTROSTOMY;  Surgeon: Carolee Hunter, MD;  Location: [facility];  Service: ENT;  Laterality: Bilateral;   NASAL POLYP SURGERY     NECK SURGERY  05/08/2006   no limitations   PACEMAKER LEADLESS INSERTION N/A  12/06/2023   Procedure: PACEMAKER LEADLESS INSERTION;  Surgeon: Ammon Blunt, MD;  Location: [facility] INVASIVE CV LAB;  Service: Cardiovascular;  Laterality: N/A;   UPPER GASTROINTESTINAL ENDOSCOPY     Social History:  reports that he quit smoking about 6 years ago. His smoking use included cigarettes. He started smoking about 63 years ago. He has a 57 pack-year smoking history. He  has never used smokeless tobacco. He reports that he does not drink alcohol and does not use drugs.  Allergies[1]  History reviewed. No pertinent family history.  Prior to Admission medications  Medication Sig Start Date End Date Taking? Authorizing Provider  albuterol  (PROVENTIL ) (2.5 MG/3ML) 0.083% nebulizer solution Take 3 mLs by nebulization every 6 (six) hours as needed for wheezing or shortness of breath. 10/08/22 03/05/24  Viviann Pastor, MD  amiodarone  (PACERONE ) 200 MG tablet Take 1 tablet (200 mg total) by mouth daily. Reduced from twice daily.  Home med. 12/07/23   Awanda City, MD  apixaban  (ELIQUIS ) 2.5 MG TABS tablet Take by mouth. 06/02/21   [provider]  budesonide  (PULMICORT ) 1 MG/2ML nebulizer solution Take 1 mg by nebulization daily.    [provider]  Cyanocobalamin  (B-12) 2500 MCG SUBL Place 1 tablet under the tongue daily. 10/12/21   Babara Call, MD  fluticasone  (FLONASE ) 50 MCG/ACT nasal spray SPRAY 2 SPRAYS INTO EACH NOSTRIL EVERY DAY 02/07/17   Verdia Art, MD  [Paused] furosemide  (LASIX ) 20 MG tablet Take 1 tablet (20 mg total) by mouth 2 (two) times daily. Resume it after checking kidney function and after discussing with primary care doctor Wait to take this until your doctor or other care provider tells you to start again. 05/18/21   Christobal Guadalajara, MD  hydrocortisone  2.5 % cream Apply topically. 06/29/21   [provider]  ipratropium (ATROVENT ) 0.02 % nebulizer solution Take 0.5 mg by nebulization 2 (two) times daily.    [provider]  mirtazapine  (REMERON ) 15 MG tablet Take 15 mg by mouth at bedtime. 12/14/20   [provider]  mometasone -formoterol  (DULERA ) 200-5 MCG/ACT AERO Inhale 2 puffs into the lungs 2 (two) times daily. 12/29/20   Fausto Burnard LABOR, DO  Multiple Vitamin (MULTIVITAMIN WITH MINERALS) TABS tablet Take 1 tablet by mouth daily. 12/30/20   Fausto Burnard LABOR, DO  naloxone  (NARCAN ) nasal spray 4 mg/0.1 mL  Place 0.4 mg into the nose once. CALL 911. SPR CONTENTS OF ONE SPRAYER (0.1ML) INTO ONE NOSTRIL. REPEAT IN 2-3 MIN IF SYMPTOMS OF OPIOID EMERGENCY PERSIST, ALTERNATE NOSTRILS 11/22/2021 11/22/21   [provider]  Nutritional Supplements (FEEDING SUPPLEMENT, NEPRO CARB STEADY,) LIQD Take 237 mLs by mouth 3 (three) times daily between meals. 12/29/20   Fausto Burnard LABOR, DO  ondansetron  (ZOFRAN -ODT) 4 MG disintegrating tablet Take 1 tablet (4 mg total) by mouth every 8 (eight) hours as needed for nausea or vomiting. 07/04/23   Babara Call, MD  oxyCODONE -acetaminophen  (PERCOCET) 10-325 MG tablet Take 1 tablet by mouth 5 (five) times daily as needed. 06/24/21   [provider]  OXYCONTIN  10 MG 12 hr tablet Take 10 mg by mouth 3 (three) times daily. 12/22/20   [provider]  OXYGEN Inhale 2 L into the lungs at bedtime as needed.    [provider]  sodium chloride  (OCEAN) 0.65 % SOLN nasal spray Place 1 spray into both nostrils as needed for congestion. 12/29/20   Fausto Burnard LABOR, DO  [Paused] spironolactone (ALDACTONE) 25 MG tablet Take 25 mg by mouth daily.  Wait to take this until your doctor or other care provider tells you to start again. 05/03/21   [provider]  tamsulosin  (FLOMAX ) 0.4 MG CAPS capsule Take 0.4 mg by mouth daily. 09/03/16   [provider]  VENTOLIN  HFA 108 (90 Base) MCG/ACT inhaler Inhale 2 puffs into the lungs every 6 (six) hours as needed. 08/07/16   [provider]    Physical Exam: Vitals:   05/21/24 1452 05/21/24 2033  BP: (!) 117/53 (!) 132/51  Pulse: 65 62  Resp: 18 19  Temp: 99.1 F (37.3 C) 98.1 F (36.7 C)  TempSrc: Oral Oral  SpO2: 100% 100%  Weight: 62.1 kg   Height: 6' (1.829 m)    Physical Exam Vitals and nursing note reviewed.  Constitutional:      General: He is not in acute distress.    Appearance: He is underweight.     Comments: Frail-appearing male, in no distress  HENT:     Head:  Normocephalic and atraumatic.  Cardiovascular:     Rate and Rhythm: Normal rate and regular rhythm.     Heart sounds: Normal heart sounds.  Pulmonary:     Effort: Pulmonary effort is normal.     Breath sounds: Normal breath sounds.  Abdominal:     Palpations: Abdomen is soft.     Tenderness: There is abdominal tenderness in the periumbilical area.     Comments: Mild distention but soft on palpation  Neurological:     Mental Status: Mental status is at baseline.     Labs on Admission: I have personally reviewed following labs and imaging studies  CBC: Recent Labs  Lab 05/21/24 1456  WBC 5.4  HGB 7.8*  HCT 24.6*  MCV 107.0*  PLT 163   Basic Metabolic Panel: Recent Labs  Lab 05/21/24 1456  NA 135  K 5.5*  CL 101  CO2 25  GLUCOSE 134*  BUN 58*  CREATININE 5.12*  CALCIUM  9.0   GFR: Estimated Creatinine Clearance: 10.1 mL/min (A) (by C-G formula based on SCr of 5.12 mg/dL (H)). Liver Function Tests: Recent Labs  Lab 05/21/24 1456  AST 14*  ALT 9  ALKPHOS 63  BILITOT 0.2  PROT 6.7  ALBUMIN  4.1   No results for input(s): LIPASE, AMYLASE in the last 168 hours. No results for input(s): AMMONIA in the last 168 hours. Coagulation Profile: No results for input(s): INR, PROTIME in the last 168 hours. Cardiac Enzymes: No results for input(s): CKTOTAL, CKMB, CKMBINDEX, TROPONINI in the last 168 hours. BNP (last 3 results) Recent Labs    04/09/24 1059  PROBNP 656.0*   HbA1C: No results for input(s): HGBA1C in the last 72 hours. CBG: No results for input(s): GLUCAP in the last 168 hours. Lipid Profile: No results for input(s): CHOL, HDL, LDLCALC, TRIG, CHOLHDL, LDLDIRECT in the last 72 hours. Thyroid  Function Tests: No results for input(s): TSH, T4TOTAL, FREET4, T3FREE, THYROIDAB in the last 72 hours. Anemia Panel: No results for input(s): VITAMINB12, FOLATE, FERRITIN, TIBC, IRON , RETICCTPCT in the last  72 hours. Urine analysis:    Component Value Date/Time   COLORURINE YELLOW (A) 02/22/2024 0222   APPEARANCEUR CLEAR (A) 02/22/2024 0222   LABSPEC 1.011 02/22/2024 0222   PHURINE 6.0 02/22/2024 0222   GLUCOSEU NEGATIVE 02/22/2024 0222   HGBUR NEGATIVE 02/22/2024 0222   BILIRUBINUR NEGATIVE 02/22/2024 0222   KETONESUR NEGATIVE 02/22/2024 0222   PROTEINUR NEGATIVE 02/22/2024 0222   NITRITE NEGATIVE 02/22/2024 0222   LEUKOCYTESUR NEGATIVE 02/22/2024 0222  Radiological Exams on Admission: CT ABDOMEN PELVIS WO CONTRAST Result Date: 05/21/2024 CLINICAL DATA:  Nausea/vomiting/diarrhea for 3 weeks, dark stools, fatigue EXAM: CT ABDOMEN AND PELVIS WITHOUT CONTRAST TECHNIQUE: Multidetector CT imaging of the abdomen and pelvis was performed following the standard protocol without IV contrast. RADIATION DOSE REDUCTION: This exam was performed according to the departmental dose-optimization program which includes automated exposure control, adjustment of the mA and/or kV according to patient size and/or use of iterative reconstruction technique. COMPARISON:  04/25/2024 FINDINGS: Lower chest: No acute pleural or parenchymal lung disease. Hepatobiliary: Unremarkable unenhanced appearance of the liver and gallbladder. Pancreas: Unremarkable unenhanced appearance. Spleen: Unremarkable unenhanced appearance. Adrenals/Urinary Tract: Nonobstructing 5 mm calculus lower pole right kidney. No right-sided calculi. No obstructive uropathy within either kidney. The adrenals and bladder are unremarkable. Stomach/Bowel: No bowel obstruction or ileus. Large amount of stool throughout the colon consistent with constipation. The appendix, if still present, is not well visualized. Scattered sigmoid diverticulosis without diverticulitis. No bowel wall thickening or inflammatory change. Vascular/Lymphatic: Aortic atherosclerosis. No enlarged abdominal or pelvic lymph nodes. Reproductive: Prostate is unremarkable. Other: No free  fluid or free intraperitoneal gas. Small fat containing umbilical hernia. Musculoskeletal: No acute or destructive bony abnormalities. Reconstructed images demonstrate no additional findings. IMPRESSION: 1. Large amount of retained stool throughout the colon, consistent with constipation. No bowel obstruction or ileus. 2. Nonobstructing 5 mm left renal calculus. 3.  Aortic Atherosclerosis (ICD10-I70.0). Electronically Signed   By: Ozell Daring M.D.   On: 05/21/2024 20:27   Data Reviewed for HPI: Relevant notes from primary care and specialist visits, past discharge summaries as available in EHR, including Care Everywhere. 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   Patient symptomatic for generalized weakness and reported dark stool and 1 episode of bright red blood per rectum Suspect secondary to chronic kidney disease Stool guaiac in the ED was negative Serial H&H Transfuse if needed Nephrology consult  Acute renal failure superimposed on stage 4 chronic kidney disease (HCC) Patient reports intermittent vomiting over the past couple weeks Creatinine 5.12 with normal bicarb, up from 4.4 a month prior Received an IV fluid bolus in the ED Continue to monitor renal function and avoid nephrotoxins Nephrology consult requested  Hyperkalemia Suspect related to worsening renal function [place]  Insulin  and dextrose  and calcium  gluconate Continue to monitor Telemetry monitoring  Nausea and vomiting Abdominal  pain IBS-constipation History of gastritis on EGD 10/2023 Possibly secondary to gastritis-seen on EGD June 2025 CT abdomen and pelvis showing large stool burden, constipation but otherwise nonacute IV antiemetics, IV Protonix  Daily MiraLAX  Oral lactulose  x 2 doses Clear liquid diet advance as tolerated Can consider GI consult  History of cardiac pacemaker No acute issues suspected  Paroxysmal atrial fibrillation (HCC) Holding apixaban  while monitoring H&H Continue amiodarone   COPD (chronic obstructive pulmonary disease) (HCC) Chronic respiratory failure Not acutely exacerbated Continue home inhalers DuoNebs as needed  Type II diabetes mellitus with renal manifestations (HCC) Sliding scale insulin  coverage  BPH (benign prostatic hyperplasia) Continue tamsulosin   Irritable bowel syndrome with constipation CT abdomen and pelvis  showing large stool burden and constipation Daily MiraLAX  with Dulcolax as needed Trial of lactulose   Spinal stenosis Chronic pain Continue home meds    DVT prophylaxis: SCG  Consults: Nephrology  Advance Care Planning:   Code Status: Prior   Family Communication: Wife at bedside  Disposition Plan: Back to previous home environment  Severity of Illness: The appropriate patient status for this patient is OBSERVATION. Observation status is judged to be reasonable and necessary in order to provide the required intensity of service to ensure the patient's safety. The patient's presenting symptoms, physical exam findings, and initial radiographic and laboratory data in the context of their medical condition is felt to place them at decreased risk for further clinical deterioration. Furthermore, it is anticipated that the patient will be medically stable for discharge from the hospital within 2 midnights of admission.   Author: Delayne LULLA Solian, MD 05/21/2024 11:32 PM  For on call review [email or web address].      [1]  Allergies Allergen Reactions    Iodinated Contrast Media Other (See Comments) and Nausea Only    Dry heaves, patient states he felt like he was on fire and about to pass out.  Pre-syncope, burning  Dry heaves, patient states he felt like he was on fire and about to pass out.    Lisinopril  Swelling    Tongue swelling, neck pain and Headaches    Tramadol Other (See Comments) and Shortness Of Breath    Other reaction(s): Other (See Comments) Other Reaction: tachy Dizziness and unsteady gait.   Pregabalin Palpitations    Other reaction(s): Other (See Comments) Other Reaction: edema   "

## 2024-05-21 NOTE — ED Notes (Signed)
 Patient transported to CT

## 2024-05-21 NOTE — ED Triage Notes (Addendum)
 Pt presents to the ED via [facility] from home. Pt reports N/V/D x3 weeks. Reports dark stools for the past 10 days. Pt reports generalized fatigue and body aches. Pt's hgb was 7.7 and creatinine was 5.05  Hr 72 117/86 98.6 oral 98% 3L -Baseline O2 20g rt FA 4mg  zofran 

## 2024-05-21 NOTE — Assessment & Plan Note (Signed)
 Holding apixaban  while monitoring H&H Continue amiodarone 

## 2024-05-21 NOTE — Assessment & Plan Note (Signed)
 Patient reports intermittent vomiting over the past couple weeks Creatinine 5.12 with normal bicarb, up from 4.4 a month prior Received an IV fluid bolus in the ED Continue to monitor renal function and avoid nephrotoxins Nephrology consult requested

## 2024-05-22 ENCOUNTER — Encounter: Payer: Self-pay | Admitting: Oncology

## 2024-05-22 ENCOUNTER — Other Ambulatory Visit: Payer: Self-pay

## 2024-05-22 ENCOUNTER — Observation Stay

## 2024-05-22 DIAGNOSIS — N400 Enlarged prostate without lower urinary tract symptoms: Secondary | ICD-10-CM | POA: Diagnosis present

## 2024-05-22 DIAGNOSIS — Z992 Dependence on renal dialysis: Secondary | ICD-10-CM | POA: Diagnosis not present

## 2024-05-22 DIAGNOSIS — Z8616 Personal history of COVID-19: Secondary | ICD-10-CM | POA: Diagnosis not present

## 2024-05-22 DIAGNOSIS — J449 Chronic obstructive pulmonary disease, unspecified: Secondary | ICD-10-CM | POA: Diagnosis not present

## 2024-05-22 DIAGNOSIS — J9611 Chronic respiratory failure with hypoxia: Secondary | ICD-10-CM | POA: Diagnosis present

## 2024-05-22 DIAGNOSIS — Z7901 Long term (current) use of anticoagulants: Secondary | ICD-10-CM | POA: Diagnosis not present

## 2024-05-22 DIAGNOSIS — Z681 Body mass index (BMI) 19 or less, adult: Secondary | ICD-10-CM | POA: Diagnosis not present

## 2024-05-22 DIAGNOSIS — N184 Chronic kidney disease, stage 4 (severe): Secondary | ICD-10-CM | POA: Diagnosis not present

## 2024-05-22 DIAGNOSIS — G894 Chronic pain syndrome: Secondary | ICD-10-CM | POA: Diagnosis present

## 2024-05-22 DIAGNOSIS — E1143 Type 2 diabetes mellitus with diabetic autonomic (poly)neuropathy: Secondary | ICD-10-CM | POA: Diagnosis present

## 2024-05-22 DIAGNOSIS — N179 Acute kidney failure, unspecified: Secondary | ICD-10-CM | POA: Diagnosis present

## 2024-05-22 DIAGNOSIS — D509 Iron deficiency anemia, unspecified: Secondary | ICD-10-CM | POA: Diagnosis present

## 2024-05-22 DIAGNOSIS — J441 Chronic obstructive pulmonary disease with (acute) exacerbation: Secondary | ICD-10-CM | POA: Diagnosis present

## 2024-05-22 DIAGNOSIS — E1151 Type 2 diabetes mellitus with diabetic peripheral angiopathy without gangrene: Secondary | ICD-10-CM | POA: Diagnosis present

## 2024-05-22 DIAGNOSIS — D649 Anemia, unspecified: Secondary | ICD-10-CM | POA: Diagnosis not present

## 2024-05-22 DIAGNOSIS — E43 Unspecified severe protein-calorie malnutrition: Secondary | ICD-10-CM | POA: Diagnosis present

## 2024-05-22 DIAGNOSIS — K5521 Angiodysplasia of colon with hemorrhage: Secondary | ICD-10-CM | POA: Diagnosis present

## 2024-05-22 DIAGNOSIS — N2581 Secondary hyperparathyroidism of renal origin: Secondary | ICD-10-CM | POA: Diagnosis present

## 2024-05-22 DIAGNOSIS — D631 Anemia in chronic kidney disease: Secondary | ICD-10-CM | POA: Diagnosis present

## 2024-05-22 DIAGNOSIS — E1122 Type 2 diabetes mellitus with diabetic chronic kidney disease: Secondary | ICD-10-CM | POA: Diagnosis present

## 2024-05-22 DIAGNOSIS — N186 End stage renal disease: Secondary | ICD-10-CM | POA: Diagnosis present

## 2024-05-22 DIAGNOSIS — D539 Nutritional anemia, unspecified: Secondary | ICD-10-CM | POA: Diagnosis present

## 2024-05-22 DIAGNOSIS — I442 Atrioventricular block, complete: Secondary | ICD-10-CM | POA: Diagnosis present

## 2024-05-22 DIAGNOSIS — Z7951 Long term (current) use of inhaled steroids: Secondary | ICD-10-CM | POA: Diagnosis not present

## 2024-05-22 DIAGNOSIS — Z79899 Other long term (current) drug therapy: Secondary | ICD-10-CM | POA: Diagnosis not present

## 2024-05-22 DIAGNOSIS — I480 Paroxysmal atrial fibrillation: Secondary | ICD-10-CM | POA: Diagnosis present

## 2024-05-22 DIAGNOSIS — E875 Hyperkalemia: Secondary | ICD-10-CM | POA: Diagnosis present

## 2024-05-22 LAB — BASIC METABOLIC PANEL WITH GFR
Anion gap: 9 (ref 5–15)
Anion gap: 9 (ref 5–15)
BUN: 56 mg/dL — ABNORMAL HIGH (ref 8–23)
BUN: 56 mg/dL — ABNORMAL HIGH (ref 8–23)
CO2: 25 mmol/L (ref 22–32)
CO2: 25 mmol/L (ref 22–32)
Calcium: 8.4 mg/dL — ABNORMAL LOW (ref 8.9–10.3)
Calcium: 8.6 mg/dL — ABNORMAL LOW (ref 8.9–10.3)
Chloride: 102 mmol/L (ref 98–111)
Chloride: 103 mmol/L (ref 98–111)
Creatinine, Ser: 4.93 mg/dL — ABNORMAL HIGH (ref 0.61–1.24)
Creatinine, Ser: 5.01 mg/dL — ABNORMAL HIGH (ref 0.61–1.24)
GFR, Estimated: 11 mL/min — ABNORMAL LOW
GFR, Estimated: 11 mL/min — ABNORMAL LOW
Glucose, Bld: 104 mg/dL — ABNORMAL HIGH (ref 70–99)
Glucose, Bld: 132 mg/dL — ABNORMAL HIGH (ref 70–99)
Potassium: 5.3 mmol/L — ABNORMAL HIGH (ref 3.5–5.1)
Potassium: 5.5 mmol/L — ABNORMAL HIGH (ref 3.5–5.1)
Sodium: 136 mmol/L (ref 135–145)
Sodium: 137 mmol/L (ref 135–145)

## 2024-05-22 LAB — URINALYSIS, ROUTINE W REFLEX MICROSCOPIC
Bilirubin Urine: NEGATIVE
Glucose, UA: NEGATIVE mg/dL
Hgb urine dipstick: NEGATIVE
Ketones, ur: NEGATIVE mg/dL
Leukocytes,Ua: NEGATIVE
Nitrite: NEGATIVE
Protein, ur: NEGATIVE mg/dL
Specific Gravity, Urine: 1.009 (ref 1.005–1.030)
pH: 6 (ref 5.0–8.0)

## 2024-05-22 LAB — RETICULOCYTES
Immature Retic Fract: 5.3 % (ref 2.3–15.9)
RBC.: 2.2 MIL/uL — ABNORMAL LOW (ref 4.22–5.81)
Retic Count, Absolute: 23.3 K/uL (ref 19.0–186.0)
Retic Ct Pct: 1.1 % (ref 0.4–3.1)

## 2024-05-22 LAB — FERRITIN: Ferritin: 111 ng/mL (ref 24–336)

## 2024-05-22 LAB — CBC
HCT: 23.8 % — ABNORMAL LOW (ref 39.0–52.0)
Hemoglobin: 7.5 g/dL — ABNORMAL LOW (ref 13.0–17.0)
MCH: 33.6 pg (ref 26.0–34.0)
MCHC: 31.5 g/dL (ref 30.0–36.0)
MCV: 106.7 fL — ABNORMAL HIGH (ref 80.0–100.0)
Platelets: 159 K/uL (ref 150–400)
RBC: 2.23 MIL/uL — ABNORMAL LOW (ref 4.22–5.81)
RDW: 12.6 % (ref 11.5–15.5)
WBC: 5.7 K/uL (ref 4.0–10.5)
nRBC: 0 % (ref 0.0–0.2)

## 2024-05-22 LAB — HEPATITIS B SURFACE ANTIGEN: Hepatitis B Surface Ag: NONREACTIVE

## 2024-05-22 LAB — IRON AND TIBC
Iron: 55 ug/dL (ref 45–182)
Saturation Ratios: 23 % (ref 17.9–39.5)
TIBC: 242 ug/dL — ABNORMAL LOW (ref 250–450)
UIBC: 187 ug/dL

## 2024-05-22 LAB — FOLATE: Folate: 9.5 ng/mL

## 2024-05-22 LAB — VITAMIN B12: Vitamin B-12: 738 pg/mL (ref 180–914)

## 2024-05-22 MED ORDER — IPRATROPIUM-ALBUTEROL 0.5-2.5 (3) MG/3ML IN SOLN
3.0000 mL | Freq: Four times a day (QID) | RESPIRATORY_TRACT | Status: AC | PRN
  Administered 2024-05-24 – 2024-05-25 (×2): 3 mL via RESPIRATORY_TRACT
  Filled 2024-05-22 (×2): qty 3

## 2024-05-22 MED ORDER — ONDANSETRON HCL 4 MG/2ML IJ SOLN
4.0000 mg | Freq: Four times a day (QID) | INTRAMUSCULAR | Status: DC | PRN
  Administered 2024-05-22 – 2024-05-28 (×10): 4 mg via INTRAVENOUS
  Filled 2024-05-22 (×9): qty 2

## 2024-05-22 MED ORDER — POLYETHYLENE GLYCOL 3350 17 G PO PACK
17.0000 g | PACK | Freq: Every day | ORAL | Status: AC
  Administered 2024-05-22 – 2024-05-27 (×3): 17 g via ORAL
  Filled 2024-05-22 (×6): qty 1

## 2024-05-22 MED ORDER — MORPHINE SULFATE (PF) 2 MG/ML IV SOLN
2.0000 mg | INTRAVENOUS | Status: AC | PRN
  Administered 2024-05-22 – 2024-05-29 (×23): 2 mg via INTRAVENOUS
  Filled 2024-05-22 (×24): qty 1

## 2024-05-22 MED ORDER — LACTULOSE 10 GM/15ML PO SOLN
30.0000 g | Freq: Three times a day (TID) | ORAL | Status: AC
  Administered 2024-05-22 (×2): 30 g via ORAL
  Filled 2024-05-22 (×2): qty 60

## 2024-05-22 MED ORDER — ACETAMINOPHEN 650 MG RE SUPP: 650.0000 mg | Freq: Four times a day (QID) | RECTAL | Status: AC | PRN

## 2024-05-22 MED ORDER — ACETAMINOPHEN 325 MG PO TABS
650.0000 mg | ORAL_TABLET | Freq: Four times a day (QID) | ORAL | Status: DC | PRN
  Administered 2024-05-24 – 2024-05-28 (×4): 650 mg via ORAL
  Filled 2024-05-22 (×5): qty 2

## 2024-05-22 MED ORDER — CHLORHEXIDINE GLUCONATE CLOTH 2 % EX PADS
6.0000 | MEDICATED_PAD | Freq: Every day | CUTANEOUS | Status: DC
  Administered 2024-05-23 – 2024-05-29 (×7): 6 via TOPICAL

## 2024-05-22 MED ORDER — SODIUM CHLORIDE 0.9 % IV SOLN
12.5000 mg | Freq: Four times a day (QID) | INTRAVENOUS | Status: DC | PRN
  Administered 2024-05-23 – 2024-05-26 (×4): 12.5 mg via INTRAVENOUS
  Filled 2024-05-22: qty 12.5
  Filled 2024-05-22: qty 0.5
  Filled 2024-05-22: qty 12.5
  Filled 2024-05-22: qty 0.5
  Filled 2024-05-22: qty 12.5

## 2024-05-22 MED ORDER — FUROSEMIDE 20 MG PO TABS
20.0000 mg | ORAL_TABLET | Freq: Two times a day (BID) | ORAL | Status: DC
  Administered 2024-05-22 – 2024-05-24 (×4): 20 mg via ORAL
  Filled 2024-05-22 (×5): qty 1

## 2024-05-22 MED ORDER — TAMSULOSIN HCL 0.4 MG PO CAPS
0.4000 mg | ORAL_CAPSULE | Freq: Every day | ORAL | Status: AC
  Administered 2024-05-22 – 2024-05-29 (×6): 0.4 mg via ORAL
  Filled 2024-05-22 (×7): qty 1

## 2024-05-22 MED ORDER — ESCITALOPRAM OXALATE 10 MG PO TABS
5.0000 mg | ORAL_TABLET | Freq: Every day | ORAL | Status: AC
  Administered 2024-05-22 – 2024-05-29 (×6): 5 mg via ORAL
  Filled 2024-05-22 (×7): qty 1

## 2024-05-22 MED ORDER — APIXABAN 2.5 MG PO TABS
2.5000 mg | ORAL_TABLET | Freq: Two times a day (BID) | ORAL | Status: DC
  Administered 2024-05-22 – 2024-05-24 (×5): 2.5 mg via ORAL
  Filled 2024-05-22 (×7): qty 1

## 2024-05-22 MED ORDER — SODIUM CHLORIDE 0.9 % IV SOLN: INTRAVENOUS | Status: AC

## 2024-05-22 MED ORDER — AMIODARONE HCL 200 MG PO TABS
200.0000 mg | ORAL_TABLET | Freq: Every day | ORAL | Status: AC
  Administered 2024-05-22 – 2024-05-29 (×6): 200 mg via ORAL
  Filled 2024-05-22 (×7): qty 1

## 2024-05-22 MED ORDER — TRAZODONE HCL 50 MG PO TABS
25.0000 mg | ORAL_TABLET | Freq: Every evening | ORAL | Status: DC | PRN
  Administered 2024-05-25 – 2024-05-28 (×4): 25 mg via ORAL
  Filled 2024-05-22 (×4): qty 1

## 2024-05-22 MED ORDER — OXYCODONE HCL 5 MG PO TABS
5.0000 mg | ORAL_TABLET | ORAL | Status: DC | PRN
  Administered 2024-05-23 – 2024-05-25 (×5): 5 mg via ORAL
  Filled 2024-05-22 (×6): qty 1

## 2024-05-22 MED ORDER — ONDANSETRON HCL 4 MG PO TABS: 4.0000 mg | ORAL_TABLET | Freq: Four times a day (QID) | ORAL | Status: DC | PRN

## 2024-05-22 NOTE — ED Notes (Signed)
 BSC brought into room for the pt. Pt had a small dark BM.

## 2024-05-22 NOTE — Progress Notes (Signed)
 " PROGRESS NOTE    Riley Martin  FMW:[id number] DOB: [birth year] DOA: 05/21/2024 PCP: Alla Amis, MD    Brief Narrative:  The patient is an 79 year old male with PMHx of paroxysmal A-fib on Eliquis , complete heart block s/p PPM, CKD stage IV, COPD, PVD, BPH,, IBS-C, GERD, chronic pain syndrome who presented to the ED on 05/21/2024 from nephrology clinic after outpatient labs showed a drop in Hgb and worsening kidney function for the possibility of initiation RRT and GI evaluation.  Outpatient labs on 05/19/2024 showed a hemoglobin of 7.7 (down from baseline of 8.7) and creatinine of 5 (up from 4.4 on 12/19).  Notably, the patient had been complaining of several weeks of intractable nausea and vomiting, decreased p.o. intake, worsening shortness of breath and DOE, with dark stools for the last 10 days and an episode of bright red blood while straining.   In the ED, he was afebrile with temp of 99.1 F, HR 65, RR 18, BP 117/53, SpO2 100% on 3 L nasal cannula.  CBC showed Hgb 7.8.  CMP notable for potassium 5.5, creatinine 5.12.   CT abdomen pelvis showed large amount of retained stool throughout the colon consistent with constipation but no bowel obstruction or ileus and a nonobstructing 5 mm left renal calculus. The patient was given an NS bolus and IV Reglan  for nausea.  Admitted for further management.  Assessment and Plan:  Symptomatic anemia Macrocytic anemia, chronic Anemia of CKD Concern for melena - Patient presenting with generalized weakness, SOB, DOE, reports of dark stools for 10 days - Hgb 7.5 today, down from previous baseline of 8.7 - Possibly progression of anemia of CKD vs GI blood loss - Last EGD (10/2023) showed gastritis - Last colonoscopy (05/2021) showed a single nonbleeding colonic angiectasia treated with APC - FOBT ordered in ED - Anemia panel c/w anemia of CKD. Unclear if patient's stool are truly melanotic. Patient instructed to inform RN staff of BM in  order to confirm stool. Plan to discuss with GI in the morning.  - Nephrology following  AKI on CKD Stage IV - Follows with Dr. Dominica in nephrology clinic. Was sent to the hospital due to concern for AKI on CKD stage IV and possible initiation of RRT in the setting of several weeks of N/V and decreased PO intake - Cr 5.12 on admission - Nephrology following, plan to initiate HD - IR consulted for permcath placement  Hyperkalemia - in the setting of AKI on CKD stage IV - K 5.5 on admission, received one dose of Veltassa  - Second dose of Veltassa  8.4 g given in the morning  Nausea and vomiting - Likely secondary to uremia given worsening renal function - PRN antiemetics  IBS-C - CT abd/pelvis showed large amount of retained stool throughout colon consistent with constipation - Received lactulose  30 g x 2 - Had multiple BM today  Spinal stenosis Chronic pain - On Oxycontin  10 mg TID at home - PRN oxycodone  and morphine   Scheduled Meds:  amiodarone   200 mg Oral Daily   apixaban   2.5 mg Oral BID   escitalopram   5 mg Oral Daily   lactulose   30 g Oral TID   patiromer   8.4 g Oral Daily   polyethylene glycol  17 g Oral Daily   Continuous Infusions:  sodium chloride  100 mL/hr at 05/22/24 0351   promethazine  (PHENERGAN ) injection (IM or IVPB)     PRN Meds:.acetaminophen  **OR** acetaminophen , ipratropium-albuterol , morphine  injection, ondansetron  **OR** ondansetron  (ZOFRAN ) IV, oxyCODONE , promethazine  (  PHENERGAN ) injection (IM or IVPB), traZODone   Current Outpatient Medications  Medication Instructions   albuterol  (PROVENTIL ) (2.5 MG/3ML) 0.083% nebulizer solution 3 mLs, Nebulization, Every 6 hours PRN   amiodarone  (PACERONE ) 200 mg, Oral, Daily, Reduced from twice daily.  Home med.   apixaban  (ELIQUIS ) 2.5 MG TABS tablet Take by mouth.   budesonide  (PULMICORT ) 1 mg, Daily   Cyanocobalamin  (B-12) 2500 MCG SUBL 1 tablet, Sublingual, Daily   dicyclomine  (BENTYL ) 10 mg, Oral, 3  times daily before meals   erythromycin ophthalmic ointment 1 Application, Both Eyes, Daily at bedtime   escitalopram  (LEXAPRO ) 5 mg, Oral, Daily   fluticasone  (FLONASE ) 50 MCG/ACT nasal spray SPRAY 2 SPRAYS INTO EACH NOSTRIL EVERY DAY   [Paused] furosemide  (LASIX ) 20 mg, Oral, 2 times daily, Resume it after checking kidney function and after discussing with primary care doctor   hydrocortisone  2.5 % cream Apply topically.   ipratropium (ATROVENT ) 0.5 mg, 2 times daily   ipratropium-albuterol  (DUONEB) 0.5-2.5 (3) MG/3ML SOLN 3 mLs, Inhalation, Every 6 hours PRN   mirtazapine  (REMERON ) 15 mg, Daily at bedtime   mometasone -formoterol  (DULERA ) 200-5 MCG/ACT AERO 2 puffs, Inhalation, 2 times daily   Multiple Vitamin (MULTIVITAMIN WITH MINERALS) TABS tablet 1 tablet, Oral, Daily   naloxone  (NARCAN ) 0.4 mg,  Once   Nutritional Supplements (FEEDING SUPPLEMENT, NEPRO CARB STEADY,) LIQD 237 mLs, Oral, 3 times daily between meals   omeprazole (PRILOSEC) 40 mg, Oral, 2 times daily   ondansetron  (ZOFRAN -ODT) 4 mg, Oral, Every 8 hours PRN   oxyCODONE -acetaminophen  (PERCOCET) 10-325 MG tablet 1 tablet, 5 times daily PRN   OxyCONTIN  10 mg, 3 times daily   OXYGEN 2 L, Inhalation, At bedtime PRN   sodium chloride  (OCEAN) 0.65 % SOLN nasal spray 1 spray, Each Nare, As needed   [Paused] spironolactone (ALDACTONE) 25 mg, Daily   tamsulosin  (FLOMAX ) 0.4 mg, Daily   traZODone  (DESYREL ) 25 mg, At bedtime PRN   VENTOLIN  HFA 108 (90 Base) MCG/ACT inhaler 2 puffs, Every 6 hours PRN    DVT prophylaxis: apixaban  (ELIQUIS ) tablet 2.5 mg Start: 05/22/24 1000 apixaban  (ELIQUIS ) tablet 2.5 mg   Code Status:   Code Status: Full Code  Family Communication: Discussed with son at bedside  Disposition Plan: Pending clinical improvement PT -   -   OT -   -    Level of care: Telemetry  Consultants:  Nephrology  Procedures:  None  Antimicrobials: None   Subjective: Patient examined at bedside. Reports doing  fairly okay. Confirms history of nausea and decreased PO intake. Reports an approximate 30 lbs loss as a result. Additionally significantly weaker and fatigued. Reports some pedal edema.   Objective: Vitals:   05/21/24 2033 05/22/24 0017 05/22/24 0200 05/22/24 0315  BP: (!) 132/51  136/82   Pulse: 62  78   Resp: 19  16   Temp: 98.1 F (36.7 C) (!) 97.5 F (36.4 C)  97.7 F (36.5 C)  TempSrc: Oral Oral    SpO2: 100%  96%   Weight:      Height:       No intake or output data in the 24 hours ending 05/22/24 0520 Filed Weights   05/21/24 1452  Weight: 62.1 kg    Examination:  Gen: NAD, A&Ox3 HEENT: NCAT Neck: Supple CV: RRR, no murmurs Resp: normal WOB, CTAB, no w/r/r Abd: Soft, NTND, no guarding, BS normoactive Ext: trace pedal edema bilaterally, pulses 2+ b/l Skin: Warm, dry, no rashes/lesions Neuro: no focal deficits Psych: Calm,  cooperative, appropriate affect   Data Reviewed: I have personally reviewed following labs and imaging studies  CBC: Recent Labs  Lab 05/21/24 1456 05/22/24 0321  WBC 5.4 5.7  HGB 7.8* 7.5*  HCT 24.6* 23.8*  MCV 107.0* 106.7*  PLT 163 159   Basic Metabolic Panel: Recent Labs  Lab 05/21/24 1456 05/22/24 0020 05/22/24 0321  NA 135 137 136  K 5.5* 5.3* 5.5*  CL 101 103 102  CO2 25 25 25   GLUCOSE 134* 132* 104*  BUN 58* 56* 56*  CREATININE 5.12* 4.93* 5.01*  CALCIUM  9.0 8.6* 8.4*   GFR: Estimated Creatinine Clearance: 10.3 mL/min (A) (by C-G formula based on SCr of 5.01 mg/dL (H)). Liver Function Tests: Recent Labs  Lab 05/21/24 1456  AST 14*  ALT 9  ALKPHOS 63  BILITOT 0.2  PROT 6.7  ALBUMIN  4.1   No results for input(s): LIPASE, AMYLASE in the last 168 hours. No results for input(s): AMMONIA in the last 168 hours. Coagulation Profile: No results for input(s): INR, PROTIME in the last 168 hours. Cardiac Enzymes: No results for input(s): CKTOTAL, CKMB, CKMBINDEX, TROPONINI in the last 168  hours. BNP (last 3 results) Recent Labs    04/09/24 1059  PROBNP 656.0*   HbA1C: No results for input(s): HGBA1C in the last 72 hours. CBG: No results for input(s): GLUCAP in the last 168 hours. Lipid Profile: No results for input(s): CHOL, HDL, LDLCALC, TRIG, CHOLHDL, LDLDIRECT in the last 72 hours. Thyroid  Function Tests: No results for input(s): TSH, T4TOTAL, FREET4, T3FREE, THYROIDAB in the last 72 hours. Anemia Panel: Recent Labs    05/22/24 0321  RETICCTPCT 1.1   Sepsis Labs: No results for input(s): PROCALCITON, LATICACIDVEN in the last 168 hours.  No results found for this or any previous visit (from the past 240 hours).   Radiology Studies: CT ABDOMEN PELVIS WO CONTRAST Result Date: 05/21/2024 CLINICAL DATA:  Nausea/vomiting/diarrhea for 3 weeks, dark stools, fatigue EXAM: CT ABDOMEN AND PELVIS WITHOUT CONTRAST TECHNIQUE: Multidetector CT imaging of the abdomen and pelvis was performed following the standard protocol without IV contrast. RADIATION DOSE REDUCTION: This exam was performed according to the departmental dose-optimization program which includes automated exposure control, adjustment of the mA and/or kV according to patient size and/or use of iterative reconstruction technique. COMPARISON:  04/25/2024 FINDINGS: Lower chest: No acute pleural or parenchymal lung disease. Hepatobiliary: Unremarkable unenhanced appearance of the liver and gallbladder. Pancreas: Unremarkable unenhanced appearance. Spleen: Unremarkable unenhanced appearance. Adrenals/Urinary Tract: Nonobstructing 5 mm calculus lower pole right kidney. No right-sided calculi. No obstructive uropathy within either kidney. The adrenals and bladder are unremarkable. Stomach/Bowel: No bowel obstruction or ileus. Large amount of stool throughout the colon consistent with constipation. The appendix, if still present, is not well visualized. Scattered sigmoid diverticulosis without  diverticulitis. No bowel wall thickening or inflammatory change. Vascular/Lymphatic: Aortic atherosclerosis. No enlarged abdominal or pelvic lymph nodes. Reproductive: Prostate is unremarkable. Other: No free fluid or free intraperitoneal gas. Small fat containing umbilical hernia. Musculoskeletal: No acute or destructive bony abnormalities. Reconstructed images demonstrate no additional findings. IMPRESSION: 1. Large amount of retained stool throughout the colon, consistent with constipation. No bowel obstruction or ileus. 2. Nonobstructing 5 mm left renal calculus. 3.  Aortic Atherosclerosis (ICD10-I70.0). Electronically Signed   By: Ozell Daring M.D.   On: 05/21/2024 20:27    Scheduled Meds:  amiodarone   200 mg Oral Daily   apixaban   2.5 mg Oral BID   escitalopram   5 mg Oral Daily  lactulose   30 g Oral TID   patiromer   8.4 g Oral Daily   polyethylene glycol  17 g Oral Daily   Continuous Infusions:  sodium chloride  100 mL/hr at 05/22/24 0351   promethazine  (PHENERGAN ) injection (IM or IVPB)       Unresulted Labs (From admission, onward)     Start     Ordered   05/23/24 0500  CBC  Tomorrow morning,   R        05/22/24 1633   05/23/24 0500  Renal function panel  Tomorrow morning,   R        05/22/24 1633   05/22/24 1600  Hepatitis B surface antigen  Once,   R        05/22/24 1600   05/22/24 0824  Hepatitis B surface antibody,quantitative  Once,   R        05/22/24 0823   05/22/24 0824  Hepatitis B core antibody, total  Once,   R        05/22/24 0823   05/22/24 0548  Vitamin B12  (Anemia Panel (PNL))  Add-on,   AD        05/22/24 0547             LOS:  LOS: 0 days   Time Spent: 45 minutes  Estuardo Frisbee Al-Sultani, MD [facility]  If 7PM-7AM, please contact night-coverage  05/22/2024, 5:20 AM      "

## 2024-05-22 NOTE — Progress Notes (Signed)
 " [facility]  ROUNDING NOTE   Subjective:   Riley Martin is a 79 year old male with past medical conditions including COPD, BPH, atrial fibrillation on Eliquis , IBS-C, pacemaker, anemia, and chronic kidney disease stage IV.  Patient presents to the emergency department due to abnormal labs per nephrology and has been admitted under observation for AKI (acute kidney injury) [N17.9] Symptomatic anemia [D64.9] Anemia due to chronic kidney disease, unspecified CKD stage [N18.9, D63.1]  Patient is known to our practice and receives outpatient follow-up with Dr. Korrapati.  Patient is seen resting in bed in emergency department, wife at bedside.Patient was seen at nephrology office on 1/14.  Concern arose with decreased renal function.  Patient states he feels weaker and more fatigued than usual.  Denies shortness of breath.  Labs on ED arrival concerning for potassium 5.5, BUN 58, creatinine 5.12 with GFR 11, and hemoglobin 7.8.  UA appears clear.  CT abdomen pelvis showed large stool burden and a nonobstructive 5 mm left renal calculi.   Objective:  Vital signs in last 24 hours:  Temp:  [97.5 F (36.4 C)-99.1 F (37.3 C)] 98.7 F (37.1 C) (01/15 1440) Pulse Rate:  [59-78] 70 (01/15 1440) Resp:  [11-23] 16 (01/15 1440) BP: (117-147)/(43-82) 146/70 (01/15 1440) SpO2:  [96 %-100 %] 100 % (01/15 1440) Weight:  [62.1 kg] 62.1 kg (01/14 1452)  Weight change:  Filed Weights   05/21/24 1452  Weight: 62.1 kg    Intake/Output: No intake/output data recorded.   Intake/Output this shift:  Total I/O In: -  Out: 400 [Urine:400]  Physical Exam: General: NAD  Head: Normocephalic, atraumatic. Moist oral mucosal membranes  Eyes: Anicteric  Lungs:  Clear to auscultation, normal effort  Heart: Regular rate and rhythm  Abdomen:  Soft, nontender  Extremities:  No peripheral edema.  Neurologic: Awake, alert, conversant  Skin: Warm,dry, no rash  Access: None    Basic  Metabolic Panel: Recent Labs  Lab 05/21/24 1456 05/22/24 0020 05/22/24 0321  NA 135 137 136  K 5.5* 5.3* 5.5*  CL 101 103 102  CO2 25 25 25   GLUCOSE 134* 132* 104*  BUN 58* 56* 56*  CREATININE 5.12* 4.93* 5.01*  CALCIUM  9.0 8.6* 8.4*    Liver Function Tests: Recent Labs  Lab 05/21/24 1456  AST 14*  ALT 9  ALKPHOS 63  BILITOT 0.2  PROT 6.7  ALBUMIN  4.1   No results for input(s): LIPASE, AMYLASE in the last 168 hours. No results for input(s): AMMONIA in the last 168 hours.  CBC: Recent Labs  Lab 05/21/24 1456 05/22/24 0321  WBC 5.4 5.7  HGB 7.8* 7.5*  HCT 24.6* 23.8*  MCV 107.0* 106.7*  PLT 163 159    Cardiac Enzymes: No results for input(s): CKTOTAL, CKMB, CKMBINDEX, TROPONINI in the last 168 hours.  BNP: Invalid input(s): POCBNP  CBG: No results for input(s): GLUCAP in the last 168 hours.  Microbiology: Results for orders placed or performed during the hospital encounter of 02/21/24  Resp panel by RT-PCR (RSV, Flu A&B, Covid) Anterior Nasal Swab     Status: None   Collection Time: 02/22/24 12:32 AM   Specimen: Anterior Nasal Swab  Result Value Ref Range Status   SARS Coronavirus 2 by RT PCR NEGATIVE NEGATIVE Final    Comment: (NOTE) SARS-CoV-2 target nucleic acids are NOT DETECTED.  The SARS-CoV-2 RNA is generally detectable in upper respiratory specimens during the acute phase of infection. The lowest concentration of SARS-CoV-2 viral copies this assay  can detect is 138 copies/mL. A negative result does not preclude SARS-Cov-2 infection and should not be used as the sole basis for treatment or other patient management decisions. A negative result may occur with  improper specimen collection/handling, submission of specimen other than nasopharyngeal swab, presence of viral mutation(s) within the areas targeted by this assay, and inadequate number of viral copies(<138 copies/mL). A negative result must be combined with clinical  observations, patient history, and epidemiological information. The expected result is Negative.  Fact Sheet for Patients:  bloggercourse.com  Fact Sheet for Healthcare Providers:  seriousbroker.it  This test is no t yet approved or cleared by the [place]  FDA and  has been authorized for detection and/or diagnosis of SARS-CoV-2 by FDA under an Emergency Use Authorization (EUA). This EUA will remain  in effect (meaning this test can be used) for the duration of the COVID-19 declaration under Section 564(b)(1) of the Act, 21 U.S.C.section 360bbb-3(b)(1), unless the authorization is terminated  or revoked sooner.       Influenza A by PCR NEGATIVE NEGATIVE Final   Influenza B by PCR NEGATIVE NEGATIVE Final    Comment: (NOTE) The Xpert Xpress SARS-CoV-2/FLU/RSV plus assay is intended as an aid in the diagnosis of influenza from Nasopharyngeal swab specimens and should not be used as a sole basis for treatment. Nasal washings and aspirates are unacceptable for Xpert Xpress SARS-CoV-2/FLU/RSV testing.  Fact Sheet for Patients: bloggercourse.com  Fact Sheet for Healthcare Providers: seriousbroker.it  This test is not yet approved or cleared by the [place]  FDA and has been authorized for detection and/or diagnosis of SARS-CoV-2 by FDA under an Emergency Use Authorization (EUA). This EUA will remain in effect (meaning this test can be used) for the duration of the COVID-19 declaration under Section 564(b)(1) of the Act, 21 U.S.C. section 360bbb-3(b)(1), unless the authorization is terminated or revoked.     Resp Syncytial Virus by PCR NEGATIVE NEGATIVE Final    Comment: (NOTE) Fact Sheet for Patients: bloggercourse.com  Fact Sheet for Healthcare Providers: seriousbroker.it  This test is not yet approved or cleared by  the [place]  FDA and has been authorized for detection and/or diagnosis of SARS-CoV-2 by FDA under an Emergency Use Authorization (EUA). This EUA will remain in effect (meaning this test can be used) for the duration of the COVID-19 declaration under Section 564(b)(1) of the Act, 21 U.S.C. section 360bbb-3(b)(1), unless the authorization is terminated or revoked.  Performed at [facility], [address] [place], [place] [address]     Coagulation Studies: No results for input(s): LABPROT, INR in the last 72 hours.  Urinalysis: Recent Labs    05/22/24 0020  COLORURINE STRAW*  LABSPEC 1.009  PHURINE 6.0  GLUCOSEU NEGATIVE  HGBUR NEGATIVE  BILIRUBINUR NEGATIVE  KETONESUR NEGATIVE  PROTEINUR NEGATIVE  NITRITE NEGATIVE  LEUKOCYTESUR NEGATIVE      Imaging: CT ABDOMEN PELVIS WO CONTRAST Result Date: 05/21/2024 CLINICAL DATA:  Nausea/vomiting/diarrhea for 3 weeks, dark stools, fatigue EXAM: CT ABDOMEN AND PELVIS WITHOUT CONTRAST TECHNIQUE: Multidetector CT imaging of the abdomen and pelvis was performed following the standard protocol without IV contrast. RADIATION DOSE REDUCTION: This exam was performed according to the departmental dose-optimization program which includes automated exposure control, adjustment of the mA and/or kV according to patient size and/or use of iterative reconstruction technique. COMPARISON:  04/25/2024 FINDINGS: Lower chest: No acute pleural or parenchymal lung disease. Hepatobiliary: Unremarkable unenhanced appearance of the liver and gallbladder. Pancreas: Unremarkable unenhanced appearance. Spleen: Unremarkable unenhanced  appearance. Adrenals/Urinary Tract: Nonobstructing 5 mm calculus lower pole right kidney. No right-sided calculi. No obstructive uropathy within either kidney. The adrenals and bladder are unremarkable. Stomach/Bowel: No bowel obstruction or ileus. Large amount of stool throughout the colon consistent with constipation.  The appendix, if still present, is not well visualized. Scattered sigmoid diverticulosis without diverticulitis. No bowel wall thickening or inflammatory change. Vascular/Lymphatic: Aortic atherosclerosis. No enlarged abdominal or pelvic lymph nodes. Reproductive: Prostate is unremarkable. Other: No free fluid or free intraperitoneal gas. Small fat containing umbilical hernia. Musculoskeletal: No acute or destructive bony abnormalities. Reconstructed images demonstrate no additional findings. IMPRESSION: 1. Large amount of retained stool throughout the colon, consistent with constipation. No bowel obstruction or ileus. 2. Nonobstructing 5 mm left renal calculus. 3.  Aortic Atherosclerosis (ICD10-I70.0). Electronically Signed   By: Riley Daring M.D.   On: 05/21/2024 20:27     Medications:    promethazine  (PHENERGAN ) injection (IM or IVPB)      amiodarone   200 mg Oral Daily   apixaban   2.5 mg Oral BID   escitalopram   5 mg Oral Daily   furosemide   20 mg Oral BID   lactulose   30 g Oral TID   patiromer   8.4 g Oral Daily   polyethylene glycol  17 g Oral Daily   tamsulosin   0.4 mg Oral Daily   acetaminophen  **OR** acetaminophen , ipratropium-albuterol , morphine  injection, ondansetron  **OR** ondansetron  (ZOFRAN ) IV, oxyCODONE , promethazine  (PHENERGAN ) injection (IM or IVPB), traZODone   Assessment/ Plan:  Mr. Riley Martin is a 79 y.o.  male with past medical conditions including COPD, BPH, atrial fibrillation on Eliquis , IBS-C, pacemaker, anemia, and chronic kidney disease stage IV.  Patient presents to the emergency department due to abnormal labs per nephrology and has been admitted under observation for AKI (acute kidney injury) [N17.9] Symptomatic anemia [D64.9] Anemia due to chronic kidney disease, unspecified CKD stage [N18.9, D63.1]   Acute Kidney Injury with hyperkalemia on chronic kidney disease stage IV with baseline creatinine 4.4 and GFR of 130 on 04/25/24.  Acute kidney injury vs  progression of kidney disease.  CT abdomen pelvis shows nonobstructive left renal calculi.  Potassium 5.5, given Veltassa  25.2 g.  Discussed with patient and family that we feel this is a progression of kidney disease that has led to requiring renal replacement therapy.  Family is agreeable to proceed.  Vascular surgery consulted for placement of HD catheter.  Will initiate dialysis once placed.  Dialysis navigator aware of patient and we will proceed with outpatient dialysis clinic search.  Patient will need 3 consecutive dialysis treatments prior to discharge.  Hepatitis B panel and chest x-ray ordered for outpatient placement needs.   Lab Results  Component Value Date   CREATININE 5.01 (H) 05/22/2024   CREATININE 4.93 (H) 05/22/2024   CREATININE 5.12 (H) 05/21/2024    Intake/Output Summary (Last 24 hours) at 05/22/2024 1447 Last data filed at 05/22/2024 0800 Gross per 24 hour  Intake --  Output 400 ml  Net -400 ml   2. Anemia of chronic kidney disease Lab Results  Component Value Date   HGB 7.5 (L) 05/22/2024    Hemoglobin decreased.  Will initiate ESA with dialysis.  3. Secondary Hyperparathyroidism: with outpatient labs: PTH 66 on 03/04/2024, phosphorus 5.1, calcium  8.6 on 05/19/24.   Lab Results  Component Value Date   CALCIUM  8.4 (L) 05/22/2024   PHOS 4.5 02/24/2024    Will continue to monitor bone minerals during this admission.   LOS: 0 [employer] 1/15/20262:47  PM   "

## 2024-05-22 NOTE — Plan of Care (Signed)

## 2024-05-22 NOTE — H&P (View-Only) (Signed)
 " [facility]  ROUNDING NOTE   Subjective:   Riley Martin is a 79 year old male with past medical conditions including COPD, BPH, atrial fibrillation on Eliquis , IBS-C, pacemaker, anemia, and chronic kidney disease stage IV.  Patient presents to the emergency department due to abnormal labs per nephrology and has been admitted under observation for AKI (acute kidney injury) [N17.9] Symptomatic anemia [D64.9] Anemia due to chronic kidney disease, unspecified CKD stage [N18.9, D63.1]  Patient is known to our practice and receives outpatient follow-up with Dr. Korrapati.  Patient is seen resting in bed in emergency department, wife at bedside.Patient was seen at nephrology office on 1/14.  Concern arose with decreased renal function.  Patient states he feels weaker and more fatigued than usual.  Denies shortness of breath.  Labs on ED arrival concerning for potassium 5.5, BUN 58, creatinine 5.12 with GFR 11, and hemoglobin 7.8.  UA appears clear.  CT abdomen pelvis showed large stool burden and a nonobstructive 5 mm left renal calculi.   Objective:  Vital signs in last 24 hours:  Temp:  [97.5 F (36.4 C)-99.1 F (37.3 C)] 98.7 F (37.1 C) (01/15 1440) Pulse Rate:  [59-78] 70 (01/15 1440) Resp:  [11-23] 16 (01/15 1440) BP: (117-147)/(43-82) 146/70 (01/15 1440) SpO2:  [96 %-100 %] 100 % (01/15 1440) Weight:  [62.1 kg] 62.1 kg (01/14 1452)  Weight change:  Filed Weights   05/21/24 1452  Weight: 62.1 kg    Intake/Output: No intake/output data recorded.   Intake/Output this shift:  Total I/O In: -  Out: 400 [Urine:400]  Physical Exam: General: NAD  Head: Normocephalic, atraumatic. Moist oral mucosal membranes  Eyes: Anicteric  Lungs:  Clear to auscultation, normal effort  Heart: Regular rate and rhythm  Abdomen:  Soft, nontender  Extremities:  No peripheral edema.  Neurologic: Awake, alert, conversant  Skin: Warm,dry, no rash  Access: None    Basic  Metabolic Panel: Recent Labs  Lab 05/21/24 1456 05/22/24 0020 05/22/24 0321  NA 135 137 136  K 5.5* 5.3* 5.5*  CL 101 103 102  CO2 25 25 25   GLUCOSE 134* 132* 104*  BUN 58* 56* 56*  CREATININE 5.12* 4.93* 5.01*  CALCIUM  9.0 8.6* 8.4*    Liver Function Tests: Recent Labs  Lab 05/21/24 1456  AST 14*  ALT 9  ALKPHOS 63  BILITOT 0.2  PROT 6.7  ALBUMIN  4.1   No results for input(s): LIPASE, AMYLASE in the last 168 hours. No results for input(s): AMMONIA in the last 168 hours.  CBC: Recent Labs  Lab 05/21/24 1456 05/22/24 0321  WBC 5.4 5.7  HGB 7.8* 7.5*  HCT 24.6* 23.8*  MCV 107.0* 106.7*  PLT 163 159    Cardiac Enzymes: No results for input(s): CKTOTAL, CKMB, CKMBINDEX, TROPONINI in the last 168 hours.  BNP: Invalid input(s): POCBNP  CBG: No results for input(s): GLUCAP in the last 168 hours.  Microbiology: Results for orders placed or performed during the hospital encounter of 02/21/24  Resp panel by RT-PCR (RSV, Flu A&B, Covid) Anterior Nasal Swab     Status: None   Collection Time: 02/22/24 12:32 AM   Specimen: Anterior Nasal Swab  Result Value Ref Range Status   SARS Coronavirus 2 by RT PCR NEGATIVE NEGATIVE Final    Comment: (NOTE) SARS-CoV-2 target nucleic acids are NOT DETECTED.  The SARS-CoV-2 RNA is generally detectable in upper respiratory specimens during the acute phase of infection. The lowest concentration of SARS-CoV-2 viral copies this assay  can detect is 138 copies/mL. A negative result does not preclude SARS-Cov-2 infection and should not be used as the sole basis for treatment or other patient management decisions. A negative result may occur with  improper specimen collection/handling, submission of specimen other than nasopharyngeal swab, presence of viral mutation(s) within the areas targeted by this assay, and inadequate number of viral copies(<138 copies/mL). A negative result must be combined with clinical  observations, patient history, and epidemiological information. The expected result is Negative.  Fact Sheet for Patients:  bloggercourse.com  Fact Sheet for Healthcare Providers:  seriousbroker.it  This test is no t yet approved or cleared by the [place]  FDA and  has been authorized for detection and/or diagnosis of SARS-CoV-2 by FDA under an Emergency Use Authorization (EUA). This EUA will remain  in effect (meaning this test can be used) for the duration of the COVID-19 declaration under Section 564(b)(1) of the Act, 21 U.S.C.section 360bbb-3(b)(1), unless the authorization is terminated  or revoked sooner.       Influenza A by PCR NEGATIVE NEGATIVE Final   Influenza B by PCR NEGATIVE NEGATIVE Final    Comment: (NOTE) The Xpert Xpress SARS-CoV-2/FLU/RSV plus assay is intended as an aid in the diagnosis of influenza from Nasopharyngeal swab specimens and should not be used as a sole basis for treatment. Nasal washings and aspirates are unacceptable for Xpert Xpress SARS-CoV-2/FLU/RSV testing.  Fact Sheet for Patients: bloggercourse.com  Fact Sheet for Healthcare Providers: seriousbroker.it  This test is not yet approved or cleared by the [place]  FDA and has been authorized for detection and/or diagnosis of SARS-CoV-2 by FDA under an Emergency Use Authorization (EUA). This EUA will remain in effect (meaning this test can be used) for the duration of the COVID-19 declaration under Section 564(b)(1) of the Act, 21 U.S.C. section 360bbb-3(b)(1), unless the authorization is terminated or revoked.     Resp Syncytial Virus by PCR NEGATIVE NEGATIVE Final    Comment: (NOTE) Fact Sheet for Patients: bloggercourse.com  Fact Sheet for Healthcare Providers: seriousbroker.it  This test is not yet approved or cleared by  the [place]  FDA and has been authorized for detection and/or diagnosis of SARS-CoV-2 by FDA under an Emergency Use Authorization (EUA). This EUA will remain in effect (meaning this test can be used) for the duration of the COVID-19 declaration under Section 564(b)(1) of the Act, 21 U.S.C. section 360bbb-3(b)(1), unless the authorization is terminated or revoked.  Performed at [facility], [address] [place], [place] [address]     Coagulation Studies: No results for input(s): LABPROT, INR in the last 72 hours.  Urinalysis: Recent Labs    05/22/24 0020  COLORURINE STRAW*  LABSPEC 1.009  PHURINE 6.0  GLUCOSEU NEGATIVE  HGBUR NEGATIVE  BILIRUBINUR NEGATIVE  KETONESUR NEGATIVE  PROTEINUR NEGATIVE  NITRITE NEGATIVE  LEUKOCYTESUR NEGATIVE      Imaging: CT ABDOMEN PELVIS WO CONTRAST Result Date: 05/21/2024 CLINICAL DATA:  Nausea/vomiting/diarrhea for 3 weeks, dark stools, fatigue EXAM: CT ABDOMEN AND PELVIS WITHOUT CONTRAST TECHNIQUE: Multidetector CT imaging of the abdomen and pelvis was performed following the standard protocol without IV contrast. RADIATION DOSE REDUCTION: This exam was performed according to the departmental dose-optimization program which includes automated exposure control, adjustment of the mA and/or kV according to patient size and/or use of iterative reconstruction technique. COMPARISON:  04/25/2024 FINDINGS: Lower chest: No acute pleural or parenchymal lung disease. Hepatobiliary: Unremarkable unenhanced appearance of the liver and gallbladder. Pancreas: Unremarkable unenhanced appearance. Spleen: Unremarkable unenhanced  appearance. Adrenals/Urinary Tract: Nonobstructing 5 mm calculus lower pole right kidney. No right-sided calculi. No obstructive uropathy within either kidney. The adrenals and bladder are unremarkable. Stomach/Bowel: No bowel obstruction or ileus. Large amount of stool throughout the colon consistent with constipation.  The appendix, if still present, is not well visualized. Scattered sigmoid diverticulosis without diverticulitis. No bowel wall thickening or inflammatory change. Vascular/Lymphatic: Aortic atherosclerosis. No enlarged abdominal or pelvic lymph nodes. Reproductive: Prostate is unremarkable. Other: No free fluid or free intraperitoneal gas. Small fat containing umbilical hernia. Musculoskeletal: No acute or destructive bony abnormalities. Reconstructed images demonstrate no additional findings. IMPRESSION: 1. Large amount of retained stool throughout the colon, consistent with constipation. No bowel obstruction or ileus. 2. Nonobstructing 5 mm left renal calculus. 3.  Aortic Atherosclerosis (ICD10-I70.0). Electronically Signed   By: Riley Daring M.D.   On: 05/21/2024 20:27     Medications:    promethazine  (PHENERGAN ) injection (IM or IVPB)      amiodarone   200 mg Oral Daily   apixaban   2.5 mg Oral BID   escitalopram   5 mg Oral Daily   furosemide   20 mg Oral BID   lactulose   30 g Oral TID   patiromer   8.4 g Oral Daily   polyethylene glycol  17 g Oral Daily   tamsulosin   0.4 mg Oral Daily   acetaminophen  **OR** acetaminophen , ipratropium-albuterol , morphine  injection, ondansetron  **OR** ondansetron  (ZOFRAN ) IV, oxyCODONE , promethazine  (PHENERGAN ) injection (IM or IVPB), traZODone   Assessment/ Plan:  Mr. Riley Martin is a 79 y.o.  male with past medical conditions including COPD, BPH, atrial fibrillation on Eliquis , IBS-C, pacemaker, anemia, and chronic kidney disease stage IV.  Patient presents to the emergency department due to abnormal labs per nephrology and has been admitted under observation for AKI (acute kidney injury) [N17.9] Symptomatic anemia [D64.9] Anemia due to chronic kidney disease, unspecified CKD stage [N18.9, D63.1]   Acute Kidney Injury with hyperkalemia on chronic kidney disease stage IV with baseline creatinine 4.4 and GFR of 130 on 04/25/24.  Acute kidney injury vs  progression of kidney disease.  CT abdomen pelvis shows nonobstructive left renal calculi.  Potassium 5.5, given Veltassa  25.2 g.  Discussed with patient and family that we feel this is a progression of kidney disease that has led to requiring renal replacement therapy.  Family is agreeable to proceed.  Vascular surgery consulted for placement of HD catheter.  Will initiate dialysis once placed.  Dialysis navigator aware of patient and we will proceed with outpatient dialysis clinic search.  Patient will need 3 consecutive dialysis treatments prior to discharge.  Hepatitis B panel and chest x-ray ordered for outpatient placement needs.   Lab Results  Component Value Date   CREATININE 5.01 (H) 05/22/2024   CREATININE 4.93 (H) 05/22/2024   CREATININE 5.12 (H) 05/21/2024    Intake/Output Summary (Last 24 hours) at 05/22/2024 1447 Last data filed at 05/22/2024 0800 Gross per 24 hour  Intake --  Output 400 ml  Net -400 ml   2. Anemia of chronic kidney disease Lab Results  Component Value Date   HGB 7.5 (L) 05/22/2024    Hemoglobin decreased.  Will initiate ESA with dialysis.  3. Secondary Hyperparathyroidism: with outpatient labs: PTH 66 on 03/04/2024, phosphorus 5.1, calcium  8.6 on 05/19/24.   Lab Results  Component Value Date   CALCIUM  8.4 (L) 05/22/2024   PHOS 4.5 02/24/2024    Will continue to monitor bone minerals during this admission.   LOS: 0 [employer] 1/15/20262:47  PM   "

## 2024-05-22 NOTE — ED Notes (Signed)
 Pt said that he is missing his shoes. He had them when admitted and in a different room in the ED, but has not seen them in he's current room. Patient advocate, Larnell, made aware. He said he would come talk to the pt and look for the shoes.

## 2024-05-23 ENCOUNTER — Encounter: Admission: EM | Disposition: A | Payer: Self-pay | Source: Home / Self Care

## 2024-05-23 DIAGNOSIS — D649 Anemia, unspecified: Secondary | ICD-10-CM | POA: Diagnosis not present

## 2024-05-23 DIAGNOSIS — N186 End stage renal disease: Secondary | ICD-10-CM

## 2024-05-23 HISTORY — PX: DIALYSIS/PERMA CATHETER INSERTION: CATH118288

## 2024-05-23 LAB — RENAL FUNCTION PANEL
Albumin: 4 g/dL (ref 3.5–5.0)
Anion gap: 8 (ref 5–15)
BUN: 49 mg/dL — ABNORMAL HIGH (ref 8–23)
CO2: 27 mmol/L (ref 22–32)
Calcium: 9.2 mg/dL (ref 8.9–10.3)
Chloride: 101 mmol/L (ref 98–111)
Creatinine, Ser: 4.81 mg/dL — ABNORMAL HIGH (ref 0.61–1.24)
GFR, Estimated: 12 mL/min — ABNORMAL LOW
Glucose, Bld: 91 mg/dL (ref 70–99)
Phosphorus: 4.7 mg/dL — ABNORMAL HIGH (ref 2.5–4.6)
Potassium: 5.5 mmol/L — ABNORMAL HIGH (ref 3.5–5.1)
Sodium: 136 mmol/L (ref 135–145)

## 2024-05-23 LAB — HEPATITIS B SURFACE ANTIBODY, QUANTITATIVE: Hep B S AB Quant (Post): <3.5 m[IU]/mL — ABNORMAL LOW

## 2024-05-23 LAB — CBC
HCT: 25.1 % — ABNORMAL LOW (ref 39.0–52.0)
Hemoglobin: 8 g/dL — ABNORMAL LOW (ref 13.0–17.0)
MCH: 33.1 pg (ref 26.0–34.0)
MCHC: 31.9 g/dL (ref 30.0–36.0)
MCV: 103.7 fL — ABNORMAL HIGH (ref 80.0–100.0)
Platelets: 176 K/uL (ref 150–400)
RBC: 2.42 MIL/uL — ABNORMAL LOW (ref 4.22–5.81)
RDW: 12.1 % (ref 11.5–15.5)
WBC: 5.8 K/uL (ref 4.0–10.5)
nRBC: 0 % (ref 0.0–0.2)

## 2024-05-23 LAB — HEPATITIS B CORE ANTIBODY, TOTAL: HEP B CORE AB: NEGATIVE

## 2024-05-23 MED ORDER — OXYCODONE HCL 5 MG PO TABS
ORAL_TABLET | ORAL | Status: AC
  Filled 2024-05-23: qty 1

## 2024-05-23 MED ORDER — ONDANSETRON HCL 4 MG/2ML IJ SOLN
4.0000 mg | Freq: Once | INTRAMUSCULAR | Status: AC
  Administered 2024-05-23: 4 mg via INTRAVENOUS

## 2024-05-23 MED ORDER — FENTANYL CITRATE (PF) 100 MCG/2ML IJ SOLN
INTRAMUSCULAR | Status: DC | PRN
  Administered 2024-05-23: 50 ug via INTRAVENOUS

## 2024-05-23 MED ORDER — MIDAZOLAM HCL (PF) 2 MG/2ML IJ SOLN
INTRAMUSCULAR | Status: DC | PRN
  Administered 2024-05-23: 1 mg via INTRAVENOUS

## 2024-05-23 MED ORDER — LIDOCAINE-EPINEPHRINE (PF) 1 %-1:200000 IJ SOLN
INTRAMUSCULAR | Status: DC | PRN
  Administered 2024-05-23: 20 mL via INTRADERMAL

## 2024-05-23 MED ORDER — FENTANYL CITRATE (PF) 100 MCG/2ML IJ SOLN
INTRAMUSCULAR | Status: AC
  Filled 2024-05-23: qty 2

## 2024-05-23 MED ORDER — ACETAMINOPHEN 325 MG PO TABS
ORAL_TABLET | ORAL | Status: AC
  Filled 2024-05-23: qty 2

## 2024-05-23 MED ORDER — HEPARIN SODIUM (PORCINE) 1000 UNIT/ML IJ SOLN
INTRAMUSCULAR | Status: AC
  Filled 2024-05-23: qty 3

## 2024-05-23 MED ORDER — MORPHINE SULFATE (PF) 2 MG/ML IV SOLN
INTRAVENOUS | Status: AC
  Administered 2024-05-23: 2 mg via INTRAVENOUS
  Filled 2024-05-23: qty 1

## 2024-05-23 MED ORDER — HEPARIN SODIUM (PORCINE) 10000 UNIT/ML IJ SOLN
INTRAMUSCULAR | Status: DC | PRN
  Administered 2024-05-23: 10000 [IU]

## 2024-05-23 MED ORDER — MIDAZOLAM HCL 2 MG/2ML IJ SOLN
INTRAMUSCULAR | Status: AC
  Filled 2024-05-23: qty 2

## 2024-05-23 MED ORDER — HEPARIN (PORCINE) IN NACL 1000-0.9 UT/500ML-% IV SOLN
INTRAVENOUS | Status: DC | PRN
  Administered 2024-05-23: 500 mL

## 2024-05-23 MED ORDER — ONDANSETRON HCL 4 MG/2ML IJ SOLN
INTRAMUSCULAR | Status: AC
  Filled 2024-05-23: qty 2

## 2024-05-23 MED ORDER — SODIUM CHLORIDE 0.9 % IV SOLN: INTRAVENOUS | Status: DC

## 2024-05-23 MED ORDER — CEFAZOLIN SODIUM-DEXTROSE 1-4 GM/50ML-% IV SOLN
1.0000 g | INTRAVENOUS | Status: AC
  Administered 2024-05-23: 1 g via INTRAVENOUS

## 2024-05-23 NOTE — Progress Notes (Signed)
" °   05/23/24 1632  Vitals  Temp 98.7 F (37.1 C)  BP (!) 155/66  MAP (mmHg) 91  Pulse Rate 77  ECG Heart Rate 78  Resp 19  Weight 62.2 kg  Type of Weight Post-Dialysis  Oxygen Therapy  SpO2 100 %  O2 Device Nasal Cannula (4L)  O2 Flow Rate (L/min) 4 L/min  During Treatment Monitoring  Blood Flow Rate (mL/min) 0 mL/min  Arterial Pressure (mmHg) -3.84 mmHg  Venous Pressure (mmHg) -2.42 mmHg  TMP (mmHg) 14.34 mmHg  Ultrafiltration Rate (mL/min) 174 mL/min  Dialysate Flow Rate (mL/min) 299 ml/min  Dialysate Potassium Concentration 2  Dialysate Calcium  Concentration 2.5  Duration of HD Treatment -hour(s) 1.88 hour(s)  Cumulative Fluid Removed (mL) per Treatment  -79.64  Post Treatment  Dialyzer Clearance Clear  Hemodialysis Intake (mL) 0 mL  Liters Processed 22.5  Fluid Removed (mL) 0 mL  Tolerated HD Treatment Yes  Post-Hemodialysis Comments Tx tolerated initial treatment. Vs remained stable.Patient c/o head and site pain for which he was given pain medication. Patient is using 4L of oxyen at 100%.  AVG/AVF Arterial Site Held (minutes) 0 minutes  AVG/AVF Venous Site Held (minutes) 0 minutes  Hemodialysis Catheter Right Internal jugular Double lumen Permanent (Tunneled)  Placement Date/Time: 05/23/24 1244   Serial / Lot #: [id number]  Expiration Date: 01/06/27  Time Out: Correct patient;Correct site;Correct procedure  Maximum sterile barrier precautions: Hand hygiene;Cap;Mask;Sterile gown;Sterile gloves;Large sterile ...  Site Condition No complications  Blue Lumen Status Flushed;Dead end cap in place;Heparin  locked  Red Lumen Status Flushed;Dead end cap in place;Heparin  locked  Purple Lumen Status N/A  Catheter fill solution Heparin  1000 units/ml  Catheter fill volume (Arterial) 2200 cc  Catheter fill volume (Venous) 2400  Dressing Type Transparent;Tube stabilization device  Dressing Status Clean;Dry;Intact  Drainage Description None  Dressing Change Due 05/24/24  Post  treatment catheter status Capped and Clamped    "

## 2024-05-23 NOTE — Progress Notes (Signed)
 GI note  I have been asked to come and see this patient for anemia.  He has been in the Cath Lab for a few hours.  I have completed a chart review and have pended my note with my thoughts which will be finalized after I see the patient in person tomorrow.  Since he is on a blood thinner we cannot perform any endoscopy procedures for at least 3 days till the blood thinner has been held and washed out of his system ,in the interim if he has any overt blood loss consider a CT angiogram or a tagged RBC scan to evaluate any further   Dr Ruel Kung MD,MRCP [facility]) Gastroenterology/Hepatology Pager: [phone]

## 2024-05-23 NOTE — Progress Notes (Signed)
 " [facility]  ROUNDING NOTE   Subjective:   Riley Martin is a 79 year old male with past medical conditions including COPD, BPH, atrial fibrillation on Eliquis , IBS-C, pacemaker, anemia, and chronic kidney disease stage IV.  Patient presents to the emergency department due to abnormal labs per nephrology and has been admitted under observation for AKI (acute kidney injury) [N17.9] Symptomatic anemia [D64.9] Anemia due to chronic kidney disease, unspecified CKD stage [N18.9, D63.1]  Patient is known to our practice and receives outpatient follow-up with Dr. Korrapati.    Update Patient seen laying in bed Wife at bedside NPO for permcath placement   Objective:  Vital signs in last 24 hours:  Temp:  [97.7 F (36.5 C)-98.7 F (37.1 C)] 97.7 F (36.5 C) (01/16 0808) Pulse Rate:  [63-76] 75 (01/16 1254) Resp:  [9-30] 13 (01/16 1254) BP: (117-167)/(42-78) 141/53 (01/16 1254) SpO2:  [96 %-100 %] 100 % (01/16 1254)  Weight change:  Filed Weights   05/21/24 1452  Weight: 62.1 kg    Intake/Output: I/O last 3 completed shifts: In: -  Out: 700 [Urine:700]   Intake/Output this shift:  No intake/output data recorded.  Physical Exam: General: NAD  Head: Normocephalic, atraumatic. Moist oral mucosal membranes  Eyes: Anicteric  Lungs:  Clear to auscultation, normal effort  Heart: Regular rate and rhythm  Abdomen:  Soft, nontender  Extremities:  No peripheral edema.  Neurologic: Awake, alert, conversant  Skin: Warm,dry, no rash  Access: None    Basic Metabolic Panel: Recent Labs  Lab 05/21/24 1456 05/22/24 0020 05/22/24 0321 05/23/24 0717  NA 135 137 136 136  K 5.5* 5.3* 5.5* 5.5*  CL 101 103 102 101  CO2 25 25 25 27   GLUCOSE 134* 132* 104* 91  BUN 58* 56* 56* 49*  CREATININE 5.12* 4.93* 5.01* 4.81*  CALCIUM  9.0 8.6* 8.4* 9.2  PHOS  --   --   --  4.7*    Liver Function Tests: Recent Labs  Lab 05/21/24 1456 05/23/24 0717  AST 14*  --   ALT  9  --   ALKPHOS 63  --   BILITOT 0.2  --   PROT 6.7  --   ALBUMIN  4.1 4.0   No results for input(s): LIPASE, AMYLASE in the last 168 hours. No results for input(s): AMMONIA in the last 168 hours.  CBC: Recent Labs  Lab 05/21/24 1456 05/22/24 0321 05/23/24 0717  WBC 5.4 5.7 5.8  HGB 7.8* 7.5* 8.0*  HCT 24.6* 23.8* 25.1*  MCV 107.0* 106.7* 103.7*  PLT 163 159 176    Cardiac Enzymes: No results for input(s): CKTOTAL, CKMB, CKMBINDEX, TROPONINI in the last 168 hours.  BNP: Invalid input(s): POCBNP  CBG: No results for input(s): GLUCAP in the last 168 hours.  Microbiology: Results for orders placed or performed during the hospital encounter of 02/21/24  Resp panel by RT-PCR (RSV, Flu A&B, Covid) Anterior Nasal Swab     Status: None   Collection Time: 02/22/24 12:32 AM   Specimen: Anterior Nasal Swab  Result Value Ref Range Status   SARS Coronavirus 2 by RT PCR NEGATIVE NEGATIVE Final    Comment: (NOTE) SARS-CoV-2 target nucleic acids are NOT DETECTED.  The SARS-CoV-2 RNA is generally detectable in upper respiratory specimens during the acute phase of infection. The lowest concentration of SARS-CoV-2 viral copies this assay can detect is 138 copies/mL. A negative result does not preclude SARS-Cov-2 infection and should not be used as the sole basis for treatment  or other patient management decisions. A negative result may occur with  improper specimen collection/handling, submission of specimen other than nasopharyngeal swab, presence of viral mutation(s) within the areas targeted by this assay, and inadequate number of viral copies(<138 copies/mL). A negative result must be combined with clinical observations, patient history, and epidemiological information. The expected result is Negative.  Fact Sheet for Patients:  bloggercourse.com  Fact Sheet for Healthcare Providers:  seriousbroker.it  This  test is no t yet approved or cleared by the [place]  FDA and  has been authorized for detection and/or diagnosis of SARS-CoV-2 by FDA under an Emergency Use Authorization (EUA). This EUA will remain  in effect (meaning this test can be used) for the duration of the COVID-19 declaration under Section 564(b)(1) of the Act, 21 U.S.C.section 360bbb-3(b)(1), unless the authorization is terminated  or revoked sooner.       Influenza A by PCR NEGATIVE NEGATIVE Final   Influenza B by PCR NEGATIVE NEGATIVE Final    Comment: (NOTE) The Xpert Xpress SARS-CoV-2/FLU/RSV plus assay is intended as an aid in the diagnosis of influenza from Nasopharyngeal swab specimens and should not be used as a sole basis for treatment. Nasal washings and aspirates are unacceptable for Xpert Xpress SARS-CoV-2/FLU/RSV testing.  Fact Sheet for Patients: bloggercourse.com  Fact Sheet for Healthcare Providers: seriousbroker.it  This test is not yet approved or cleared by the [place]  FDA and has been authorized for detection and/or diagnosis of SARS-CoV-2 by FDA under an Emergency Use Authorization (EUA). This EUA will remain in effect (meaning this test can be used) for the duration of the COVID-19 declaration under Section 564(b)(1) of the Act, 21 U.S.C. section 360bbb-3(b)(1), unless the authorization is terminated or revoked.     Resp Syncytial Virus by PCR NEGATIVE NEGATIVE Final    Comment: (NOTE) Fact Sheet for Patients: bloggercourse.com  Fact Sheet for Healthcare Providers: seriousbroker.it  This test is not yet approved or cleared by the [place]  FDA and has been authorized for detection and/or diagnosis of SARS-CoV-2 by FDA under an Emergency Use Authorization (EUA). This EUA will remain in effect (meaning this test can be used) for the duration of the COVID-19 declaration under  Section 564(b)(1) of the Act, 21 U.S.C. section 360bbb-3(b)(1), unless the authorization is terminated or revoked.  Performed at [facility], [address] [place], [place] [address]     Coagulation Studies: No results for input(s): LABPROT, INR in the last 72 hours.  Urinalysis: Recent Labs    05/22/24 0020  COLORURINE STRAW*  LABSPEC 1.009  PHURINE 6.0  GLUCOSEU NEGATIVE  HGBUR NEGATIVE  BILIRUBINUR NEGATIVE  KETONESUR NEGATIVE  PROTEINUR NEGATIVE  NITRITE NEGATIVE  LEUKOCYTESUR NEGATIVE      Imaging: PERIPHERAL VASCULAR CATHETERIZATION Result Date: 05/23/2024 See surgical note for result.  DG Chest 1 View Result Date: 05/22/2024 EXAM: 1 VIEW(S) XRAY OF THE CHEST 05/22/2024 03:39:25 PM COMPARISON: 02/22/2024 CLINICAL HISTORY: Acute kidney failure FINDINGS: LINES, TUBES AND DEVICES: Leadless pacemaker in place. LUNGS AND PLEURA: Chronic hyperinflation. Emphysema. No focal pulmonary opacity. No pleural effusion. No pneumothorax. HEART AND MEDIASTINUM: Aortic atherosclerosis. No acute abnormality of the cardiac and mediastinal silhouettes. BONES AND SOFT TISSUES: No acute osseous abnormality. IMPRESSION: 1. No acute findings. 2. Emphysema. Electronically signed by: Waddell Calk MD 05/22/2024 04:09 PM EST RP Workstation: [id number]   CT ABDOMEN PELVIS WO CONTRAST Result Date: 05/21/2024 CLINICAL DATA:  Nausea/vomiting/diarrhea for 3 weeks, dark stools, fatigue EXAM: CT ABDOMEN AND PELVIS WITHOUT CONTRAST TECHNIQUE:  Multidetector CT imaging of the abdomen and pelvis was performed following the standard protocol without IV contrast. RADIATION DOSE REDUCTION: This exam was performed according to the departmental dose-optimization program which includes automated exposure control, adjustment of the mA and/or kV according to patient size and/or use of iterative reconstruction technique. COMPARISON:  04/25/2024 FINDINGS: Lower chest: No acute pleural or parenchymal lung  disease. Hepatobiliary: Unremarkable unenhanced appearance of the liver and gallbladder. Pancreas: Unremarkable unenhanced appearance. Spleen: Unremarkable unenhanced appearance. Adrenals/Urinary Tract: Nonobstructing 5 mm calculus lower pole right kidney. No right-sided calculi. No obstructive uropathy within either kidney. The adrenals and bladder are unremarkable. Stomach/Bowel: No bowel obstruction or ileus. Large amount of stool throughout the colon consistent with constipation. The appendix, if still present, is not well visualized. Scattered sigmoid diverticulosis without diverticulitis. No bowel wall thickening or inflammatory change. Vascular/Lymphatic: Aortic atherosclerosis. No enlarged abdominal or pelvic lymph nodes. Reproductive: Prostate is unremarkable. Other: No free fluid or free intraperitoneal gas. Small fat containing umbilical hernia. Musculoskeletal: No acute or destructive bony abnormalities. Reconstructed images demonstrate no additional findings. IMPRESSION: 1. Large amount of retained stool throughout the colon, consistent with constipation. No bowel obstruction or ileus. 2. Nonobstructing 5 mm left renal calculus. 3.  Aortic Atherosclerosis (ICD10-I70.0). Electronically Signed   By: Ozell Daring M.D.   On: 05/21/2024 20:27     Medications:    sodium chloride  20 mL/hr at 05/23/24 1223    ceFAZolin  (ANCEF ) IV 1 g (05/23/24 1253)   [MAR Hold] promethazine  (PHENERGAN ) injection (IM or IVPB) 12.5 mg (05/23/24 0704)    [MAR Hold] amiodarone   200 mg Oral Daily   [MAR Hold] apixaban   2.5 mg Oral BID   [MAR Hold] Chlorhexidine  Gluconate Cloth  6 each Topical Q0600   [MAR Hold] escitalopram   5 mg Oral Daily   [MAR Hold] furosemide   20 mg Oral BID   ondansetron        [MAR Hold] patiromer   8.4 g Oral Daily   [MAR Hold] polyethylene glycol  17 g Oral Daily   [MAR Hold] tamsulosin   0.4 mg Oral Daily   [MAR Hold] acetaminophen  **OR** [MAR Hold] acetaminophen , fentaNYL , Heparin   (Porcine) in NaCl, heparin , [MAR Hold] ipratropium-albuterol , lidocaine -EPINEPHrine  (PF), midazolam  PF, [MAR Hold]  morphine  injection, ondansetron , [MAR Hold] ondansetron  **OR** [MAR Hold] ondansetron  (ZOFRAN ) IV, [MAR Hold] oxyCODONE , [MAR Hold] promethazine  (PHENERGAN ) injection (IM or IVPB), [MAR Hold] traZODone   Assessment/ Plan:  Mr. Riley Martin is a 79 y.o.  male with past medical conditions including COPD, BPH, atrial fibrillation on Eliquis , IBS-C, pacemaker, anemia, and chronic kidney disease stage IV.  Patient presents to the emergency department due to abnormal labs per nephrology and has been admitted under observation for AKI (acute kidney injury) [N17.9] Symptomatic anemia [D64.9] Anemia due to chronic kidney disease, unspecified CKD stage [N18.9, D63.1]   Acute Kidney Injury with hyperkalemia on chronic kidney disease stage IV with baseline creatinine 4.4 and GFR of 130 on 04/25/24.  Acute kidney injury vs progression of kidney disease.  CT abdomen pelvis shows nonobstructive left renal calculi.  Potassium 5.5, given Veltassa  25.2 g.  Discussed with patient and family that we feel this is a progression of kidney disease that has led to requiring renal replacement therapy.  Family is agreeable to proceed.    Appreciate vascular placing permcath today. Will initiate dialysis today after placement. Will need treatment tomorrow as well. Will begin outpatient dialysis clinic search.    Lab Results  Component Value Date   CREATININE 4.81 (  H) 05/23/2024   CREATININE 5.01 (H) 05/22/2024   CREATININE 4.93 (H) 05/22/2024    Intake/Output Summary (Last 24 hours) at 05/23/2024 1306 Last data filed at 05/22/2024 2125 Gross per 24 hour  Intake --  Output 300 ml  Net -300 ml   2. Anemia of chronic kidney disease Lab Results  Component Value Date   HGB 8.0 (L) 05/23/2024    Hgb stable. Will order Retacirt IV with dialysis on Saturday.   3. Secondary Hyperparathyroidism: with  outpatient labs: PTH 66 on 03/04/2024, phosphorus 5.1, calcium  8.6 on 05/19/24.   Lab Results  Component Value Date   CALCIUM  9.2 05/23/2024   PHOS 4.7 (H) 05/23/2024    Bone minerals within optimal range.    LOS: 1 Maicy Filip 1/16/20261:06 PM   "

## 2024-05-23 NOTE — Plan of Care (Signed)

## 2024-05-23 NOTE — Progress Notes (Addendum)
 TOC Brief note  Patient is being followed for Anemia, CRF, Hypekalemia, and N/V. The medical record has been reviewed. The patient is from home. The patient has a payor and PCP on record. No SDOH interventions needed.   No recs at this time, please outreach to [facility] if needs are identified.

## 2024-05-23 NOTE — Consult Note (Signed)
 "   Ruel Kung MD, MRCP(U.K) [address]  [address]  [place], [place] [address]  Main: [phone]  Fax: [phone]   Gastroenterology Consultation  Referring Provider:     No ref. provider found Primary Care Physician:  Alla Amis, MD Primary Gastroenterologist:  Dr. Aundria  Reason for Consultation:     GI bleed        HPI:   Riley Martin is a 79 y.o. y/o male has been admitted in the hospital.  He has a history of paroxysmal atrial fibrillation on Eliquis  complete heart block status post permanent pacemaker, CKD stage IV, BPH IBS-C GERD.  Presented to the hospital with intractable nausea vomiting shortness of breath and dark stools of 10 days duration on 05/21/2024 and I have been consulted today for concerns of a GI bleed  Seen in May 2025 by Romero Antigua for weight loss.  This admission hemoglobin on presentation was 7.8 g with an MCV of 107 B12 normal, folate normal, ferritin normal, iron  studies does not show picture of iron  deficiency anemia reticulocyte count was not elevated.  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 Aundria large amount of food was present in the stomach otherwise appeared normal 05/21/2024 CT abdomen pelvis without contrast showed large amount of stool throughout the colon consistent with constipation  Past Medical History:  Diagnosis Date   A-fib (HCC)    Arthritis    BPH (benign prostatic hyperplasia)    Cancer (HCC)    skin cancer   Chronic pain    COPD (chronic obstructive pulmonary disease) (HCC)    COVID    GERD (gastroesophageal reflux disease)    Headache    History of insomnia    IBS (irritable bowel syndrome)    Iron  deficiency anemia due to chronic blood loss 07/12/2021   Orthostatic dizziness    PONV (postoperative  nausea and vomiting)    Spinal stenosis    Torn rotator cuff    left    Past Surgical History:  Procedure Laterality Date   COLONOSCOPY     COLONOSCOPY WITH PROPOFOL  N/A 05/13/2021   Procedure: COLONOSCOPY WITH PROPOFOL ;  Surgeon: Kung Ruel, MD;  Location: [facility] ENDOSCOPY;  Service: Gastroenterology;  Laterality: N/A;   ESOPHAGOGASTRODUODENOSCOPY N/A 03/14/2021   Procedure: ESOPHAGOGASTRODUODENOSCOPY (EGD);  Surgeon: Onita Elspeth Sharper, DO;  Location: [facility] ENDOSCOPY;  Service: Gastroenterology;  Laterality: N/A;   ESOPHAGOGASTRODUODENOSCOPY N/A 05/13/2021   Procedure: ESOPHAGOGASTRODUODENOSCOPY (EGD);  Surgeon: Kung Ruel, MD;  Location: [facility] ENDOSCOPY;  Service: Gastroenterology;  Laterality: N/A;   ESOPHAGOGASTRODUODENOSCOPY N/A 10/10/2023   Procedure: EGD (ESOPHAGOGASTRODUODENOSCOPY);  Surgeon: Toledo, Ladell POUR, MD;  Location: [facility] ENDOSCOPY;  Service: Gastroenterology;  Laterality: N/A;  Eliquis    EXCISION CHONCHA BULLOSA Bilateral 06/02/2015   Procedure: BILATERAL CHONCHA BULLOSA;  Surgeon: Carolee Hunter, MD;  Location: [facility]GERY [facility];  Service: ENT;  Laterality: Bilateral;   HERNIA REPAIR     MAXILLARY ANTROSTOMY Bilateral 06/02/2015   Procedure: MAXILLARY ANTROSTOMY;  Surgeon: Carolee Hunter, MD;  Location: [facility];  Service: ENT;  Laterality: Bilateral;   NASAL POLYP SURGERY     NECK SURGERY  05/08/2006   no limitations   PACEMAKER LEADLESS INSERTION N/A 12/06/2023   Procedure: PACEMAKER LEADLESS INSERTION;  Surgeon: Ammon Blunt, MD;  Location: [facility] INVASIVE CV LAB;  Service: Cardiovascular;  Laterality: N/A;   UPPER GASTROINTESTINAL ENDOSCOPY      Prior to Admission medications  Medication Sig Start Date End Date Taking? Authorizing Provider  albuterol  (PROVENTIL ) (2.5 MG/3ML) 0.083% nebulizer solution Take 3 mLs by nebulization every 6 (six) hours as needed for wheezing or shortness of breath. 10/08/22 05/22/24 Yes Viviann Pastor, MD  amiodarone   (PACERONE ) 200 MG tablet Take 1 tablet (200 mg total) by mouth daily. Reduced from twice daily.  Home med. 12/07/23  Yes Awanda City, MD  apixaban  (ELIQUIS ) 2.5 MG TABS tablet Take by mouth. 06/02/21  Yes [provider]  budesonide  (PULMICORT ) 1 MG/2ML nebulizer solution Take 1 mg by nebulization daily.   Yes [provider]  Cyanocobalamin  (B-12) 2500 MCG SUBL Place 1 tablet under the tongue daily. 10/12/21  Yes Babara Call, MD  dicyclomine  (BENTYL ) 10 MG capsule Take 10 mg by mouth 3 (three) times daily before meals. 04/30/24 04/30/25 Yes [provider]  erythromycin ophthalmic ointment Place 1 Application into both eyes at bedtime.   Yes [provider]  fluticasone  (FLONASE ) 50 MCG/ACT nasal spray SPRAY 2 SPRAYS INTO EACH NOSTRIL EVERY DAY 02/07/17  Yes Verdia Art, MD  [Paused] furosemide  (LASIX ) 20 MG tablet Take 1 tablet (20 mg total) by mouth 2 (two) times daily. Resume it after checking kidney function and after discussing with primary care doctor Wait to take this until your doctor or other care provider tells you to start again. 05/18/21  Yes Kc, Mennie, MD  ipratropium-albuterol  (DUONEB) 0.5-2.5 (3) MG/3ML SOLN Inhale 3 mLs into the lungs every 6 (six) hours as needed. 12/28/23  Yes [provider]  mometasone -formoterol  (DULERA ) 200-5 MCG/ACT AERO Inhale 2 puffs into the lungs 2 (two) times daily. 12/29/20  Yes Fausto Burnard LABOR, DO  Multiple Vitamin (MULTIVITAMIN WITH MINERALS) TABS tablet Take 1 tablet by mouth daily. 12/30/20  Yes Fausto Burnard A, DO  naloxone  (NARCAN ) nasal spray 4 mg/0.1 mL Place 0.4 mg into the nose once. CALL 911. SPR CONTENTS OF ONE SPRAYER (0.1ML) INTO ONE NOSTRIL. REPEAT IN 2-3 MIN IF SYMPTOMS OF OPIOID EMERGENCY PERSIST, ALTERNATE NOSTRILS 11/22/2021 11/22/21  Yes [provider]  ondansetron  (ZOFRAN -ODT) 4 MG disintegrating tablet Take 1 tablet (4 mg total) by mouth every 8 (eight) hours as needed for nausea or  vomiting. 07/04/23  Yes Babara Call, MD  oxyCODONE -acetaminophen  (PERCOCET) 10-325 MG tablet Take 1 tablet by mouth 5 (five) times daily as needed. 06/24/21  Yes [provider]  OXYCONTIN  10 MG 12 hr tablet Take 10 mg by mouth 3 (three) times daily. 12/22/20  Yes [provider]  sodium chloride  (OCEAN) 0.65 % SOLN nasal spray Place 1 spray into both nostrils as needed for congestion. 12/29/20  Yes Fausto Burnard A, DO  tamsulosin  (FLOMAX ) 0.4 MG CAPS capsule Take 0.4 mg by mouth daily. 09/03/16  Yes [provider]  traZODone  (DESYREL ) 50 MG tablet Take 25 mg by mouth at bedtime as needed.   Yes [provider]  VENTOLIN  HFA 108 (90 Base) MCG/ACT inhaler Inhale 2 puffs into the lungs every 6 (six) hours as needed. 08/07/16  Yes [provider]  escitalopram  (LEXAPRO ) 5 MG tablet Take 5 mg by mouth daily.    [provider]  hydrocortisone  2.5 % cream Apply topically. Patient not taking: Reported on 05/22/2024 06/29/21   [provider]  ipratropium (ATROVENT ) 0.02 % nebulizer solution Take 0.5 mg by nebulization 2 (two) times daily.    [provider]  mirtazapine  (  REMERON ) 15 MG tablet Take 15 mg by mouth at bedtime. Patient not taking: Reported on 05/22/2024 12/14/20   [provider]  Nutritional Supplements (FEEDING SUPPLEMENT, NEPRO CARB STEADY,) LIQD Take 237 mLs by mouth 3 (three) times daily between meals. 12/29/20   Fausto Burnard LABOR, DO  omeprazole (PRILOSEC) 40 MG capsule Take 40 mg by mouth 2 (two) times daily.    [provider]  OXYGEN Inhale 2 L into the lungs at bedtime as needed.    [provider]  [Paused] spironolactone (ALDACTONE) 25 MG tablet Take 25 mg by mouth daily. Patient not taking: Reported on 05/22/2024 Wait to take this until your doctor or other care provider tells you to start again. 05/03/21   [provider]    History reviewed. No pertinent family history.   Social  History[1]  Allergies as of 05/21/2024 - Review Complete 05/21/2024  Allergen Reaction Noted   Iodinated contrast media Other (See Comments) and Nausea Only 10/14/2014   Lisinopril  Swelling 07/12/2020   Tramadol Other (See Comments) and Shortness Of Breath 10/14/2014   Pregabalin Palpitations     Review of Systems:    All systems reviewed and negative except where noted in HPI.   Physical Exam:  BP (!) 125/42 (BP Location: Right Arm)   Pulse 63   Temp 97.7 F (36.5 C)   Resp 18   Ht 6' (1.829 m)   Wt 62.1 kg   SpO2 99%   BMI 18.58 kg/m  No LMP for male patient. Psych:  Alert and cooperative. Normal mood and affect. General:   Alert,  Well-developed, well-nourished, pleasant and cooperative in NAD Head:  Normocephalic and atraumatic. Eyes:  Sclera clear, no icterus.   Conjunctiva pink. Ears:  Normal auditory acuity. Neck:  Supple; no masses or thyromegaly. Lungs:  Respirations even and unlabored.  Clear throughout to auscultation.   No wheezes, crackles, or rhonchi. No acute distress. Heart:  Regular rate and rhythm; no murmurs, clicks, rubs, or gallops. Abdomen:  Normal bowel sounds.  No bruits.  Soft, non-tender and non-distended without masses, hepatosplenomegaly or hernias noted.  No guarding or rebound tenderness.    Neurologic:  Alert and oriented x3;  grossly normal neurologically. Psych:  Alert and cooperative. Normal mood and affect.  Imaging Studies: DG Chest 1 View Result Date: 05/22/2024 EXAM: 1 VIEW(S) XRAY OF THE CHEST 05/22/2024 03:39:25 PM COMPARISON: 02/22/2024 CLINICAL HISTORY: Acute kidney failure FINDINGS: LINES, TUBES AND DEVICES: Leadless pacemaker in place. LUNGS AND PLEURA: Chronic hyperinflation. Emphysema. No focal pulmonary opacity. No pleural effusion. No pneumothorax. HEART AND MEDIASTINUM: Aortic atherosclerosis. No acute abnormality of the cardiac and mediastinal silhouettes. BONES AND SOFT TISSUES: No acute osseous abnormality. IMPRESSION: 1. No  acute findings. 2. Emphysema. Electronically signed by: Waddell Calk MD 05/22/2024 04:09 PM EST RP Workstation: [id number]   CT ABDOMEN PELVIS WO CONTRAST Result Date: 05/21/2024 CLINICAL DATA:  Nausea/vomiting/diarrhea for 3 weeks, dark stools, fatigue EXAM: CT ABDOMEN AND PELVIS WITHOUT CONTRAST TECHNIQUE: Multidetector CT imaging of the abdomen and pelvis was performed following the standard protocol without IV contrast. RADIATION DOSE REDUCTION: This exam was performed according to the departmental dose-optimization program which includes automated exposure control, adjustment of the mA and/or kV according to patient size and/or use of iterative reconstruction technique. COMPARISON:  04/25/2024 FINDINGS: Lower chest: No acute pleural or parenchymal lung disease. Hepatobiliary: Unremarkable unenhanced appearance of the liver and gallbladder. Pancreas: Unremarkable unenhanced appearance. Spleen: Unremarkable unenhanced appearance. Adrenals/Urinary Tract: Nonobstructing 5 mm calculus lower  pole right kidney. No right-sided calculi. No obstructive uropathy within either kidney. The adrenals and bladder are unremarkable. Stomach/Bowel: No bowel obstruction or ileus. Large amount of stool throughout the colon consistent with constipation. The appendix, if still present, is not well visualized. Scattered sigmoid diverticulosis without diverticulitis. No bowel wall thickening or inflammatory change. Vascular/Lymphatic: Aortic atherosclerosis. No enlarged abdominal or pelvic lymph nodes. Reproductive: Prostate is unremarkable. Other: No free fluid or free intraperitoneal gas. Small fat containing umbilical hernia. Musculoskeletal: No acute or destructive bony abnormalities. Reconstructed images demonstrate no additional findings. IMPRESSION: 1. Large amount of retained stool throughout the colon, consistent with constipation. No bowel obstruction or ileus. 2. Nonobstructing 5 mm left renal calculus. 3.  Aortic  Atherosclerosis (ICD10-I70.0). Electronically Signed   By: Ozell Daring M.D.   On: 05/21/2024 20:27   CT ABDOMEN PELVIS WO CONTRAST Result Date: 04/25/2024 CLINICAL DATA:  General abdominal pain.  Nausea and vomiting. EXAM: CT ABDOMEN AND PELVIS WITHOUT CONTRAST TECHNIQUE: Multidetector CT imaging of the abdomen and pelvis was performed following the standard protocol without IV contrast. RADIATION DOSE REDUCTION: This exam was performed according to the departmental dose-optimization program which includes automated exposure control, adjustment of the mA and/or kV according to patient size and/or use of iterative reconstruction technique. COMPARISON:  CT scan abdomen and pelvis from 02/22/2024. FINDINGS: Lower chest: The lung bases are clear. No pleural effusion. The heart is normal in size. No pericardial effusion. Hepatobiliary: The liver is normal in size. Non-cirrhotic configuration. No suspicious mass. No intrahepatic or extrahepatic bile duct dilation. No calcified gallstones. Normal gallbladder wall thickness. No pericholecystic inflammatory changes. Pancreas: Unremarkable. No pancreatic ductal dilatation or surrounding inflammatory changes. Spleen: Within normal limits. No focal lesion. Adrenals/Urinary Tract: Adrenal glands are unremarkable. No suspicious renal mass within the limitations of this unenhanced exam. There is a 5 mm nonobstructing calculus in the left kidney lower pole calyx. No left ureterolithiasis or obstructive uropathy. There is persistent mild fullness in the right renal collecting system however, right pelvis and ureter are unremarkable. No right nephroureterolithiasis or obstructive uropathy. Unremarkable urinary bladder. Stomach/Bowel: No disproportionate dilation of the small or large bowel loops. No evidence of abnormal bowel wall thickening or inflammatory changes. The appendix is unremarkable. There are multiple diverticula mainly in the sigmoid colon, without imaging signs  of diverticulitis. Vascular/Lymphatic: No ascites or pneumoperitoneum. No abdominal or pelvic lymphadenopathy, by size criteria. No aneurysmal dilation of the major abdominal arteries. There are moderate peripheral atherosclerotic vascular calcifications of the aorta and its major branches. Reproductive: Normal size prostate. Slightly asymmetrically prominent right seminal vesicle in comparison to the left, similar to the prior study and of unknown clinical significance. Other: There is a tiny fat containing umbilical hernia. The soft tissues and abdominal wall are otherwise unremarkable. Musculoskeletal: No suspicious osseous lesions. There are moderate multilevel degenerative changes in the visualized spine. IMPRESSION: 1. No acute inflammatory process identified within the abdomen or pelvis. 2. There is a 5 mm nonobstructing calculus in the left kidney lower pole calyx. No obstructive uropathy on either side. 3. Multiple other nonacute observations, as described above. Aortic Atherosclerosis (ICD10-I70.0). Electronically Signed   By: Ree Molt M.D.   On: 04/25/2024 15:03    Assessment and Plan:   Riley Martin is a 79 y.o. 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  Due to concerns of melena,High risk for AVMs of the colon or GI tract to cause bleeding due to CKD.  On apixaban  will require it to be held for 3 days before any endoscopy procedure.   Plan 1.  Once nausea vomiting resolves and he is able to take a bowel prep, shortness of breath is improved after dialysis we can consider EGD/colonoscopy once he is off Apixaban  for3 days due to CKD otherwise its usually 2 days. Monitor and transfuse as needed   2.  The anemia  could be related to CKD but his reticulocyte count is not elevated indicating possibly either low erythropoietin  or bone marrow dysfunction recommend appropriate workup as needed     Dr Ruel Kung MD,MRCP(U.K)      [1]  Social History Tobacco Use   Smoking status: Former    Current packs/day: 0.00    Average packs/day: 1 pack/day for 57.0 years (57.0 ttl pk-yrs)    Types: Cigarettes    Start date: 1963    Quit date: 2020    Years since quitting: 6.0   Smokeless tobacco: Never  Vaping Use   Vaping status: Never Used  Substance Use Topics   Alcohol use: No   Drug use: No   "

## 2024-05-23 NOTE — Plan of Care (Signed)
?  Problem: Clinical Measurements: ?Goal: Will remain free from infection ?Outcome: Progressing ?Goal: Diagnostic test results will improve ?Outcome: Progressing ?  ?Problem: Activity: ?Goal: Risk for activity intolerance will decrease ?Outcome: Progressing ?  ?Problem: Coping: ?Goal: Level of anxiety will decrease ?Outcome: Progressing ?  ?

## 2024-05-23 NOTE — Progress Notes (Signed)
 " PROGRESS NOTE    Riley Martin Every  FMW:[id number] DOB: 1945/06/02 DOA: 05/21/2024 PCP: Alla Amis, MD    Brief Narrative:  The patient is an 79 year old male with PMHx of paroxysmal A-fib on Eliquis , complete heart block s/p PPM, CKD stage IV, COPD, PVD, BPH,, IBS-C, GERD, chronic pain syndrome who presented to the ED on 05/21/2024 from nephrology clinic after outpatient labs showed a drop in Hgb and worsening kidney function for the possibility of initiation RRT and GI evaluation.  Outpatient labs on 05/19/2024 showed a hemoglobin of 7.7 (down from baseline of 8.7) and creatinine of 5 (up from 4.4 on 12/19).  Notably, the patient had been complaining of several weeks of intractable nausea and vomiting, decreased p.o. intake, worsening shortness of breath and DOE, with dark stools for the last 10 days and an episode of bright red blood while straining.   In the ED, he was afebrile with temp of 99.1 F, HR 65, RR 18, BP 117/53, SpO2 100% on 3 L nasal cannula.  CBC showed Hgb 7.8.  CMP notable for potassium 5.5, creatinine 5.12.   CT abdomen pelvis showed large amount of retained stool throughout the colon consistent with constipation but no bowel obstruction or ileus and a nonobstructing 5 mm left renal calculus. The patient was given an NS bolus and IV Reglan  for nausea.  Admitted for further management.  Assessment and Plan:  Symptomatic anemia Macrocytic anemia, chronic Anemia of CKD Concern for melena - Patient presenting with generalized weakness, SOB, DOE, reports of dark stools for 10 days - Possibly progression of anemia of CKD vs GI blood loss - Last EGD (10/2023) showed gastritis - Last colonoscopy (05/2021) showed a single nonbleeding colonic angiectasia treated with APC - FOBT ordered in ED - Anemia panel c/w anemia of CKD.  - Unclear if patient's stool are truly melanotic. Patient instructed to inform RN staff of BM in order to confirm stool, had none today.  -  Discussed with Dr. Therisa with GI, will evaluate - Nephrology following - Hgb stable/improved at 8.0  AKI on CKD Stage IV - Follows with Dr. Dominica in nephrology clinic. Was sent to the hospital due to concern for AKI on CKD stage IV and possible initiation of RRT in the setting of several weeks of N/V and decreased PO intake - Cr 5.12 on admission - Nephrology following - Underwent placement of RIJ tunneled HD catheter placement - Underwent first HD session today  Hyperkalemia - in the setting of AKI on CKD stage IV - K 5.5  - Corrected with HD  Nausea and vomiting - Likely secondary to uremia given worsening renal function - PRN antiemetics  IBS-C - CT abd/pelvis showed large amount of retained stool throughout colon consistent with constipation - Received lactulose  30 g x 2 on 1/15 with multiple bowel movements  Spinal stenosis Chronic pain - On Oxycontin  10 mg TID at home - PRN oxycodone  and morphine   Scheduled Meds:  amiodarone   200 mg Oral Daily   apixaban   2.5 mg Oral BID   Chlorhexidine  Gluconate Cloth  6 each Topical Q0600   escitalopram   5 mg Oral Daily   furosemide   20 mg Oral BID   ondansetron        patiromer   8.4 g Oral Daily   polyethylene glycol  17 g Oral Daily   tamsulosin   0.4 mg Oral Daily   Continuous Infusions:  promethazine  (PHENERGAN ) injection (IM or IVPB) 12.5 mg (05/23/24 1331)   PRN Meds:.acetaminophen  **  OR** acetaminophen , ipratropium-albuterol , morphine  injection, ondansetron , ondansetron  **OR** ondansetron  (ZOFRAN ) IV, oxyCODONE , promethazine  (PHENERGAN ) injection (IM or IVPB), traZODone   Current Outpatient Medications  Medication Instructions   albuterol  (PROVENTIL ) (2.5 MG/3ML) 0.083% nebulizer solution 3 mLs, Nebulization, Every 6 hours PRN   amiodarone  (PACERONE ) 200 mg, Oral, Daily, Reduced from twice daily.  Home med.   apixaban  (ELIQUIS ) 2.5 MG TABS tablet Take by mouth.   budesonide  (PULMICORT ) 1 mg, Daily   Cyanocobalamin   (B-12) 2500 MCG SUBL 1 tablet, Sublingual, Daily   dicyclomine  (BENTYL ) 10 mg, Oral, 3 times daily before meals   erythromycin ophthalmic ointment 1 Application, Both Eyes, Daily at bedtime   escitalopram  (LEXAPRO ) 5 mg, Oral, Daily   fluticasone  (FLONASE ) 50 MCG/ACT nasal spray SPRAY 2 SPRAYS INTO EACH NOSTRIL EVERY DAY   [Paused] furosemide  (LASIX ) 20 mg, Oral, 2 times daily, Resume it after checking kidney function and after discussing with primary care doctor   hydrocortisone  2.5 % cream Apply topically.   ipratropium (ATROVENT ) 0.5 mg, 2 times daily   ipratropium-albuterol  (DUONEB) 0.5-2.5 (3) MG/3ML SOLN 3 mLs, Inhalation, Every 6 hours PRN   mirtazapine  (REMERON ) 15 mg, Daily at bedtime   mometasone -formoterol  (DULERA ) 200-5 MCG/ACT AERO 2 puffs, Inhalation, 2 times daily   Multiple Vitamin (MULTIVITAMIN WITH MINERALS) TABS tablet 1 tablet, Oral, Daily   naloxone  (NARCAN ) 0.4 mg,  Once   Nutritional Supplements (FEEDING SUPPLEMENT, NEPRO CARB STEADY,) LIQD 237 mLs, Oral, 3 times daily between meals   omeprazole (PRILOSEC) 40 mg, Oral, 2 times daily   ondansetron  (ZOFRAN -ODT) 4 mg, Oral, Every 8 hours PRN   oxyCODONE -acetaminophen  (PERCOCET) 10-325 MG tablet 1 tablet, 5 times daily PRN   OxyCONTIN  10 mg, 3 times daily   OXYGEN 2 L, Inhalation, At bedtime PRN   sodium chloride  (OCEAN) 0.65 % SOLN nasal spray 1 spray, Each Nare, As needed   [Paused] spironolactone (ALDACTONE) 25 mg, Daily   tamsulosin  (FLOMAX ) 0.4 mg, Daily   traZODone  (DESYREL ) 25 mg, At bedtime PRN   VENTOLIN  HFA 108 (90 Base) MCG/ACT inhaler 2 puffs, Every 6 hours PRN    DVT prophylaxis: apixaban  (ELIQUIS ) tablet 2.5 mg Start: 05/22/24 1000 apixaban  (ELIQUIS ) tablet 2.5 mg   Code Status:   Code Status: Full Code  Family Communication: Discussed with wife at bedside  Disposition Plan: Pending clinical improvement PT -   -   OT -   -    Level of care: Telemetry  Consultants:  Nephrology, GI, vascular  surgery  Procedures:  None  Antimicrobials: None   Subjective: Patient examined at bedside.  Tolerated HD today.  Main complaint is pain from tunneled RIJ catheter.  Has not eaten anything due to pain.  Objective: Vitals:   05/23/24 1624 05/23/24 1632 05/23/24 1729 05/23/24 1922  BP: (!) 153/73 (!) 155/66 (!) 154/63 (!) 145/95  Pulse: 81 77 80 81  Resp: 15 19 17 16   Temp:  98.7 F (37.1 C) 98.2 F (36.8 C) 98.2 F (36.8 C)  TempSrc:   Oral   SpO2: 100% 100% 96% 99%  Weight:  62.2 kg    Height:        Intake/Output Summary (Last 24 hours) at 05/23/2024 2044 Last data filed at 05/23/2024 1632 Gross per 24 hour  Intake --  Output 300 ml  Net -300 ml   Filed Weights   05/21/24 1452 05/23/24 1632  Weight: 62.1 kg 62.2 kg    Examination:  Gen: NAD, A&Ox3 HEENT: NCAT Neck: Supple, RIJ tunneled HD  catheter in place CV: RRR, no murmurs Resp: normal WOB, CTAB, no w/r/r Abd: Soft, NTND, no guarding, BS normoactive Ext: trace pedal edema bilaterally, pulses 2+ b/l Skin: Warm, dry, no rashes/lesions Neuro: no focal deficits Psych: Calm, cooperative, appropriate affect   Data Reviewed: I have personally reviewed following labs and imaging studies  CBC: Recent Labs  Lab 05/21/24 1456 05/22/24 0321 05/23/24 0717  WBC 5.4 5.7 5.8  HGB 7.8* 7.5* 8.0*  HCT 24.6* 23.8* 25.1*  MCV 107.0* 106.7* 103.7*  PLT 163 159 176   Basic Metabolic Panel: Recent Labs  Lab 05/21/24 1456 05/22/24 0020 05/22/24 0321 05/23/24 0717  NA 135 137 136 136  K 5.5* 5.3* 5.5* 5.5*  CL 101 103 102 101  CO2 25 25 25 27   GLUCOSE 134* 132* 104* 91  BUN 58* 56* 56* 49*  CREATININE 5.12* 4.93* 5.01* 4.81*  CALCIUM  9.0 8.6* 8.4* 9.2  PHOS  --   --   --  4.7*   GFR: Estimated Creatinine Clearance: 10.8 mL/min (A) (by C-G formula based on SCr of 4.81 mg/dL (H)). Liver Function Tests: Recent Labs  Lab 05/21/24 1456 05/23/24 0717  AST 14*  --   ALT 9  --   ALKPHOS 63  --   BILITOT  0.2  --   PROT 6.7  --   ALBUMIN  4.1 4.0   No results for input(s): LIPASE, AMYLASE in the last 168 hours. No results for input(s): AMMONIA in the last 168 hours. Coagulation Profile: No results for input(s): INR, PROTIME in the last 168 hours. Cardiac Enzymes: No results for input(s): CKTOTAL, CKMB, CKMBINDEX, TROPONINI in the last 168 hours. BNP (last 3 results) Recent Labs    04/09/24 1059  PROBNP 656.0*   HbA1C: No results for input(s): HGBA1C in the last 72 hours. CBG: No results for input(s): GLUCAP in the last 168 hours. Lipid Profile: No results for input(s): CHOL, HDL, LDLCALC, TRIG, CHOLHDL, LDLDIRECT in the last 72 hours. Thyroid  Function Tests: No results for input(s): TSH, T4TOTAL, FREET4, T3FREE, THYROIDAB in the last 72 hours. Anemia Panel: Recent Labs    05/22/24 0321 05/22/24 1033  VITAMINB12  --  738  FOLATE  --  9.5  FERRITIN 111  --   TIBC 242*  --   IRON  55  --   RETICCTPCT 1.1  --    Sepsis Labs: No results for input(s): PROCALCITON, LATICACIDVEN in the last 168 hours.  No results found for this or any previous visit (from the past 240 hours).   Radiology Studies: PERIPHERAL VASCULAR CATHETERIZATION Result Date: 05/23/2024 See surgical note for result.  DG Chest 1 View Result Date: 05/22/2024 EXAM: 1 VIEW(S) XRAY OF THE CHEST 05/22/2024 03:39:25 PM COMPARISON: 02/22/2024 CLINICAL HISTORY: Acute kidney failure FINDINGS: LINES, TUBES AND DEVICES: Leadless pacemaker in place. LUNGS AND PLEURA: Chronic hyperinflation. Emphysema. No focal pulmonary opacity. No pleural effusion. No pneumothorax. HEART AND MEDIASTINUM: Aortic atherosclerosis. No acute abnormality of the cardiac and mediastinal silhouettes. BONES AND SOFT TISSUES: No acute osseous abnormality. IMPRESSION: 1. No acute findings. 2. Emphysema. Electronically signed by: Waddell Calk MD 05/22/2024 04:09 PM EST RP Workstation: [id number]     Scheduled Meds:  amiodarone   200 mg Oral Daily   apixaban   2.5 mg Oral BID   Chlorhexidine  Gluconate Cloth  6 each Topical Q0600   escitalopram   5 mg Oral Daily   furosemide   20 mg Oral BID   ondansetron        patiromer   8.4 g  Oral Daily   polyethylene glycol  17 g Oral Daily   tamsulosin   0.4 mg Oral Daily   Continuous Infusions:  promethazine  (PHENERGAN ) injection (IM or IVPB) 12.5 mg (05/23/24 1331)     Unresulted Labs (From admission, onward)     Start     Ordered   05/24/24 0500  CBC  Tomorrow morning,   R        05/23/24 2049   05/24/24 0500  Renal function panel  Tomorrow morning,   R        05/23/24 2049             LOS:  LOS: 1 day   Time Spent: 35 minutes  Teresha Hanks Al-Sultani, MD [facility]  If 7PM-7AM, please contact night-coverage  05/23/2024, 8:44 PM      "

## 2024-05-23 NOTE — Op Note (Signed)
 OPERATIVE NOTE    PRE-OPERATIVE DIAGNOSIS: 1. ESRD   POST-OPERATIVE DIAGNOSIS: same as above  PROCEDURE: Ultrasound guidance for vascular access to the right internal jugular vein Fluoroscopic guidance for placement of catheter Placement of a 19 cm tip to cuff tunneled hemodialysis catheter via the right internal jugular vein  SURGEON: Selinda Gu, MD  ANESTHESIA:  Local with Moderate conscious sedation for approximately 12 minutes using 1 mg of Versed  and 50 mcg of Fentanyl   ESTIMATED BLOOD LOSS: 5 cc  FLUORO TIME: less than one minute  CONTRAST: none  FINDING(S): 1.  Patent right internal jugular vein  SPECIMEN(S):  None  INDICATIONS:   Riley Martin is a 79 y.o.male who presents with renal failure.  The patient needs long term dialysis access for their ESRD, and a Permcath is necessary.  Risks and benefits are discussed and informed consent is obtained.    DESCRIPTION: After obtaining full informed written consent, the patient was brought back to the vascular suited. The patient's right neck and chest were sterilely prepped and draped in a sterile surgical field was created. Moderate conscious sedation was administered during a face to face encounter with the patient throughout the procedure with my supervision of the RN administering medicines and monitoring the patient's vital signs, pulse oximetry, telemetry and mental status throughout from the start of the procedure until the patient was taken to the recovery room.  The right internal jugular vein was visualized with ultrasound and found to be patent. It was then accessed under direct ultrasound guidance and a permanent image was recorded. A wire was placed. After skin nick and dilatation, the peel-away sheath was placed over the wire. I then turned my attention to an area under the clavicle. Approximately 1-2 fingerbreadths below the clavicle a small counterincision was created and tunneled from the subclavicular  incision to the access site. Using fluoroscopic guidance, a 19 centimeter tip to cuff tunneled hemodialysis catheter was selected, and tunneled from the subclavicular incision to the access site. It was then placed through the peel-away sheath and the peel-away sheath was removed. Using fluoroscopic guidance the catheter tips were parked in the right atrium. The appropriate distal connectors were placed. It withdrew blood well and flushed easily with heparinized saline and a concentrated heparin  solution was then placed. It was secured to the chest wall with 2 Prolene sutures. The access incision was closed single 4-0 Monocryl. A 4-0 Monocryl pursestring suture was placed around the exit site. Sterile dressings were placed. The patient tolerated the procedure well and was taken to the recovery room in stable condition.  COMPLICATIONS: None  CONDITION: Stable  Selinda Gu, MD 05/23/2024 12:55 PM   This note was created with Dragon Medical transcription system. Any errors in dictation are purely unintentional.

## 2024-05-23 NOTE — Interval H&P Note (Signed)
 History and Physical Interval Note:  05/23/2024 11:43 AM  Riley Martin  has presented today for surgery, with the diagnosis of renal failure.  The various methods of treatment have been discussed with the patient and family. After consideration of risks, benefits and other options for treatment, the patient has consented to  Procedures: DIALYSIS/PERMA CATHETER INSERTION (N/A) as a surgical intervention.  The patient's history has been reviewed, patient examined, no change in status, stable for surgery.  I have reviewed the patient's chart and labs.  Questions were answered to the patient's satisfaction.     Katharyn Schauer

## 2024-05-24 DIAGNOSIS — N179 Acute kidney failure, unspecified: Secondary | ICD-10-CM | POA: Diagnosis not present

## 2024-05-24 DIAGNOSIS — N184 Chronic kidney disease, stage 4 (severe): Secondary | ICD-10-CM | POA: Diagnosis not present

## 2024-05-24 DIAGNOSIS — Z95828 Presence of other vascular implants and grafts: Secondary | ICD-10-CM

## 2024-05-24 DIAGNOSIS — J449 Chronic obstructive pulmonary disease, unspecified: Secondary | ICD-10-CM

## 2024-05-24 DIAGNOSIS — D649 Anemia, unspecified: Secondary | ICD-10-CM | POA: Diagnosis not present

## 2024-05-24 DIAGNOSIS — E875 Hyperkalemia: Secondary | ICD-10-CM | POA: Diagnosis not present

## 2024-05-24 DIAGNOSIS — Z9889 Other specified postprocedural states: Secondary | ICD-10-CM

## 2024-05-24 DIAGNOSIS — J9611 Chronic respiratory failure with hypoxia: Secondary | ICD-10-CM

## 2024-05-24 LAB — RENAL FUNCTION PANEL
Albumin: 4 g/dL (ref 3.5–5.0)
Anion gap: 8 (ref 5–15)
BUN: 33 mg/dL — ABNORMAL HIGH (ref 8–23)
CO2: 29 mmol/L (ref 22–32)
Calcium: 9.2 mg/dL (ref 8.9–10.3)
Chloride: 99 mmol/L (ref 98–111)
Creatinine, Ser: 3.82 mg/dL — ABNORMAL HIGH (ref 0.61–1.24)
GFR, Estimated: 15 mL/min — ABNORMAL LOW
Glucose, Bld: 95 mg/dL (ref 70–99)
Phosphorus: 4.2 mg/dL (ref 2.5–4.6)
Potassium: 5.2 mmol/L — ABNORMAL HIGH (ref 3.5–5.1)
Sodium: 136 mmol/L (ref 135–145)

## 2024-05-24 LAB — CBC
HCT: 26 % — ABNORMAL LOW (ref 39.0–52.0)
Hemoglobin: 8.5 g/dL — ABNORMAL LOW (ref 13.0–17.0)
MCH: 33.7 pg (ref 26.0–34.0)
MCHC: 32.7 g/dL (ref 30.0–36.0)
MCV: 103.2 fL — ABNORMAL HIGH (ref 80.0–100.0)
Platelets: 182 K/uL (ref 150–400)
RBC: 2.52 MIL/uL — ABNORMAL LOW (ref 4.22–5.81)
RDW: 12.1 % (ref 11.5–15.5)
WBC: 6.8 K/uL (ref 4.0–10.5)
nRBC: 0 % (ref 0.0–0.2)

## 2024-05-24 MED ORDER — SODIUM CHLORIDE 0.9 % IV SOLN: INTRAVENOUS | Status: DC

## 2024-05-24 MED ORDER — MORPHINE SULFATE (PF) 2 MG/ML IV SOLN
INTRAVENOUS | Status: AC
  Filled 2024-05-24: qty 1

## 2024-05-24 MED ORDER — PEG 3350-KCL-NA BICARB-NACL 420 G PO SOLR
4000.0000 mL | Freq: Once | ORAL | Status: DC
  Filled 2024-05-24: qty 4000

## 2024-05-24 MED ORDER — OXYCODONE HCL 5 MG PO TABS
ORAL_TABLET | ORAL | Status: AC
  Filled 2024-05-24: qty 1

## 2024-05-24 MED ORDER — HEPARIN SODIUM (PORCINE) 1000 UNIT/ML IJ SOLN
INTRAMUSCULAR | Status: AC
  Filled 2024-05-24: qty 5

## 2024-05-24 NOTE — Progress Notes (Signed)
 Mobility Specialist - Progress Note     05/24/24 1100  Mobility  Activity Ambulated with assistance;Stood with assistance;Dangled on edge of bed;Turned to right side;Turned to left side  Level of Assistance Standby assist, set-up cues, supervision of patient - no hands on  Assistive Device Front wheel walker  Distance Ambulated (ft) 360 ft  Range of Motion/Exercises Active  Activity Response Tolerated well  Mobility Referral Yes  Mobility visit 1 Mobility   Pt resting in bed on 3L upon entry. Pt STS and ambulates to hallway around NS SBA with RW for 2 laps. Pt returned to recliner and left with needs in reach. Wife present at bedside.   Guido Rumble Mobility Specialist 05/24/24, 2:23 PM

## 2024-05-24 NOTE — Progress Notes (Signed)
" °  °  °  Daily Progress Note   Assessment/Planning:   POD #1 s/p RIJV TDC placement  Reportedly TDC functioned without difficulty No complications involving TDC placement In pt with low BMI, there is less subcutaneous fat which makes the tunneling more difficult  There are NO major nerves in the route of the tunneling tunnel Pain rx prn   Subjective  - 1 Day Post-Op   C/o pain in tunnel   Objective   Vitals:   05/23/24 1922 05/23/24 2310 05/24/24 0337 05/24/24 0849  BP: (!) 145/95 (!) 144/59 (!) 146/61 119/61  Pulse: 81 66 65 69  Resp: 16 16 15 16   Temp: 98.2 F (36.8 C) 97.7 F (36.5 C) 98.1 F (36.7 C) 98.5 F (36.9 C)  TempSrc:  Oral Oral Oral  SpO2: 99% 100% 100% 97%  Weight:      Height:         Intake/Output Summary (Last 24 hours) at 05/24/2024 0855 Last data filed at 05/24/2024 0305 Gross per 24 hour  Intake 100 ml  Output 0 ml  Net 100 ml    VASC R neck: no hematoma, Exit site: bandaged without active bleeding, Tunnel: no hematoma, appropriate depth    Laboratory   CBC    Latest Ref Rng & Units 05/24/2024    7:00 AM 05/23/2024    7:17 AM 05/22/2024    3:21 AM  CBC  WBC 4.0 - 10.5 K/uL 6.8  5.8  5.7   Hemoglobin 13.0 - 17.0 g/dL 8.5  8.0  7.5   Hematocrit 39.0 - 52.0 % 26.0  25.1  23.8   Platelets 150 - 400 K/uL 182  176  159     BMET    Component Value Date/Time   NA 136 05/24/2024 0700   NA 140 06/14/2012 0656   K 5.2 (H) 05/24/2024 0700   K 3.9 06/14/2012 0656   CL 99 05/24/2024 0700   CL 103 06/14/2012 0656   CO2 29 05/24/2024 0700   CO2 31 06/14/2012 0656   GLUCOSE 95 05/24/2024 0700   GLUCOSE 91 06/14/2012 0656   BUN 33 (H) 05/24/2024 0700   BUN 22 (H) 06/14/2012 0656   CREATININE 3.82 (H) 05/24/2024 0700   CREATININE 1.24 06/14/2012 0656   CALCIUM  9.2 05/24/2024 0700   CALCIUM  8.6 06/14/2012 0656   GFRNONAA 15 (L) 05/24/2024 0700   GFRNONAA 59 (L) 06/14/2012 0656   GFRAA >60 12/24/2015 1327   GFRAA >60 06/14/2012 9343      Redell Door, MD, FACS, FSVS Covering for [facility]: [phone]  05/24/2024, 8:55 AM       "

## 2024-05-24 NOTE — Plan of Care (Signed)

## 2024-05-24 NOTE — Plan of Care (Signed)
 Patient has complained of headache, neck specifically at the cathater site. The  site with no sign and symptom of infection. Patient has required pain medication every 2 hours. Problem: Education: Goal: Knowledge of General Education information will improve Description: Including pain rating scale, medication(s)/side effects and non-pharmacologic comfort measures Outcome: Progressing   Problem: Health Behavior/Discharge Planning: Goal: Ability to manage health-related needs will improve Outcome: Progressing   Problem: Clinical Measurements: Goal: Ability to maintain clinical measurements within normal limits will improve Outcome: Progressing Goal: Will remain free from infection Outcome: Progressing Goal: Diagnostic test results will improve Outcome: Progressing Goal: Respiratory complications will improve Outcome: Progressing Goal: Cardiovascular complication will be avoided Outcome: Progressing   Problem: Elimination: Goal: Will not experience complications related to bowel motility Outcome: Progressing Goal: Will not experience complications related to urinary retention Outcome: Progressing   Problem: Pain Managment: Goal: General experience of comfort will improve and/or be controlled Outcome: Progressing

## 2024-05-24 NOTE — Progress Notes (Signed)
 " [facility]  PROGRESS NOTE   Subjective:   Patient seen at bedside.  His wife is in  attendance.  Had stable dialysis yesterday.  Complains of discomfort at the [facility] insertion site.  Still has shortness of breath secondary to COPD  Objective:  Vital signs: Blood pressure (!) 127/59, pulse 73, temperature 98.1 F (36.7 C), temperature source Oral, resp. rate 16, height 6' (1.829 m), weight 55.3 kg, SpO2 100%.  Intake/Output Summary (Last 24 hours) at 05/24/2024 1400 Last data filed at 05/24/2024 0305 Gross per 24 hour  Intake 100 ml  Output 0 ml  Net 100 ml   Filed Weights   05/21/24 1452 05/23/24 1632 05/24/24 1343  Weight: 62.1 kg 62.2 kg 55.3 kg     Physical Exam: General:  No acute distress  Head:  Normocephalic, atraumatic. Moist oral mucosal membranes  Eyes:  Anicteric  Neck:  Supple  Lungs:   Clear to auscultation, normal effort  Heart:  S1S2 no rubs  Abdomen:   Soft, nontender, bowel sounds present  Extremities:  peripheral edema.  Neurologic:  Awake, alert, following commands  Skin:  No lesions  Access:     Basic Metabolic Panel: Recent Labs  Lab 05/21/24 1456 05/22/24 0020 05/22/24 0321 05/23/24 0717 05/24/24 0700  NA 135 137 136 136 136  K 5.5* 5.3* 5.5* 5.5* 5.2*  CL 101 103 102 101 99  CO2 25 25 25 27 29   GLUCOSE 134* 132* 104* 91 95  BUN 58* 56* 56* 49* 33*  CREATININE 5.12* 4.93* 5.01* 4.81* 3.82*  CALCIUM  9.0 8.6* 8.4* 9.2 9.2  PHOS  --   --   --  4.7* 4.2   GFR: Estimated Creatinine Clearance: 12.1 mL/min (A) (by C-G formula based on SCr of 3.82 mg/dL (H)).  Liver Function Tests: Recent Labs  Lab 05/21/24 1456 05/23/24 0717 05/24/24 0700  AST 14*  --   --   ALT 9  --   --   ALKPHOS 63  --   --   BILITOT 0.2  --   --   PROT 6.7  --   --   ALBUMIN  4.1 4.0 4.0   No results for input(s): LIPASE, AMYLASE in the last 168 hours. No results for input(s): AMMONIA in the last 168 hours.  CBC: Recent Labs  Lab  05/21/24 1456 05/22/24 0321 05/23/24 0717 05/24/24 0700  WBC 5.4 5.7 5.8 6.8  HGB 7.8* 7.5* 8.0* 8.5*  HCT 24.6* 23.8* 25.1* 26.0*  MCV 107.0* 106.7* 103.7* 103.2*  PLT 163 159 176 182     HbA1C: Hgb A1c MFr Bld  Date/Time Value Ref Range Status  12/05/2023 04:36 PM 5.6 4.8 - 5.6 % Final    Comment:    (NOTE) Diagnosis of Diabetes The following HbA1c ranges recommended by the American Diabetes Association (ADA) may be used as an aid in the diagnosis of diabetes mellitus.  Hemoglobin             Suggested A1C NGSP%              Diagnosis  <5.7                   Non Diabetic  5.7-6.4                Pre-Diabetic  >6.4                   Diabetic  <7.0  Glycemic control for                       adults with diabetes.    05/10/2021 07:01 PM 5.6 4.8 - 5.6 % Final    Comment:    (NOTE) Pre diabetes:          5.7%-6.4%  Diabetes:              >6.4%  Glycemic control for   <7.0% adults with diabetes     Urinalysis: Recent Labs    05/22/24 0020  COLORURINE STRAW*  LABSPEC 1.009  PHURINE 6.0  GLUCOSEU NEGATIVE  HGBUR NEGATIVE  BILIRUBINUR NEGATIVE  KETONESUR NEGATIVE  PROTEINUR NEGATIVE  NITRITE NEGATIVE  LEUKOCYTESUR NEGATIVE      Imaging: PERIPHERAL VASCULAR CATHETERIZATION Result Date: 05/23/2024 See surgical note for result.  DG Chest 1 View Result Date: 05/22/2024 EXAM: 1 VIEW(S) XRAY OF THE CHEST 05/22/2024 03:39:25 PM COMPARISON: 02/22/2024 CLINICAL HISTORY: Acute kidney failure FINDINGS: LINES, TUBES AND DEVICES: Leadless pacemaker in place. LUNGS AND PLEURA: Chronic hyperinflation. Emphysema. No focal pulmonary opacity. No pleural effusion. No pneumothorax. HEART AND MEDIASTINUM: Aortic atherosclerosis. No acute abnormality of the cardiac and mediastinal silhouettes. BONES AND SOFT TISSUES: No acute osseous abnormality. IMPRESSION: 1. No acute findings. 2. Emphysema. Electronically signed by: Taylor Stroud MD 05/22/2024 04:09 PM EST  RP Workstation: [id number]     Medications:    sodium chloride  Stopped (05/24/24 1324)   promethazine  (PHENERGAN ) injection (IM or IVPB) 150 mL/hr at 05/24/24 0305    amiodarone   200 mg Oral Daily   apixaban   2.5 mg Oral BID   Chlorhexidine  Gluconate Cloth  6 each Topical Q0600   escitalopram   5 mg Oral Daily   polyethylene glycol  17 g Oral Daily   [START ON 05/25/2024] polyethylene glycol-electrolytes  4,000 mL Oral Once   tamsulosin   0.4 mg Oral Daily    Assessment/ Plan:     79 year old male with PMHx of paroxysmal A-fib on Eliquis , complete heart block s/p PPM, CKD stage IV, COPD, PVD, BPH,, IBS-C, GERD, chronic pain syndrome who presented to the ED on 05/21/2024 with worsening shortness of breath and weakness.  He also had bright red blood per rectum and his hemoglobin was around 7.  #1: Acute kidney injury on CKD: Since patient had symptoms of nausea, vomiting along with worsening renal indices he was initiated on dialysis.  Will dialyze him again today.  #2: COPD exacerbation: Will continue the inhalers as tolerated.  On nasal cannula oxygen.  #3: Anemia: Patient is status post blood transfusion.  GI workup in progress.  #4: Secondary hyperparathyroidism: Will check PTH, calcium  and phosphorus levels.  #5: Hyperkalemia: Potassium should improve with dialysis.  Advised on low potassium diet.  Spoke to patient's wife at bedside. Labs and medications reviewed. Will continue to follow along with you.   LOS: 2 Pinkey Edman, MD [facility] 1/17/20262:00 PM  "

## 2024-05-24 NOTE — Progress Notes (Signed)
 Full consult note under consults- pt was seen today not yesterday as he was in cath lab    Dr Ruel Kung MD,MRCP [facility]) Gastroenterology/Hepatology Pager: [phone]

## 2024-05-24 NOTE — Progress Notes (Signed)
 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  Outpatient labs on 05/19/2024 showed a hemoglobin of 7.7 (down from baseline of 8.7) and creatinine of 5 (up from 4.4 on 12/19).  Notably, the patient had been complaining of several weeks of intractable nausea and vomiting, decreased p.o. intake, worsening shortness of breath and DOE, with dark stools for the last 10 days and an episode of bright red blood while straining.   In the ED, he was afebrile with temp of 99.1 F, HR 65, RR 18, BP 117/53, SpO2 100% on 3 L nasal cannula.  CBC showed Hgb 7.8.  CMP notable for potassium 5.5, creatinine 5.12.   CT abdomen pelvis showed large amount of retained stool throughout the colon consistent with constipation but no bowel obstruction or ileus and a nonobstructing 5 mm left renal calculus. The patient was given an NS bolus and IV Reglan  for nausea.  Admitted for further management.  Assessment and Plan:  Symptomatic anemia Macrocytic anemia, chronic Anemia of CKD Concern for melena - Patient presenting with generalized weakness, SOB, DOE, reports of dark stools for 10 days - Possibly progression of anemia of CKD vs GI blood loss - Last EGD (10/2023) showed gastritis - Last colonoscopy (05/2021) showed a single nonbleeding colonic angiectasia treated with APC - FOBT ordered in ED - Anemia panel c/w anemia of CKD.  - Unclear if patient's stool are truly melanotic. Patient instructed to inform RN staff of BM in order to confirm stool, had  none today.  - GI following, tentatively planned for EGD and colonoscopy on Monday 1/19 - Nephrology following - Hgb stable/improved at 8.5  AKI on CKD Stage IV - Follows with Dr. Dominica in nephrology clinic. Was sent to the hospital due to concern for AKI on CKD stage IV and possible initiation of RRT in the setting of several weeks of N/V and decreased PO intake - Cr 5.12 on admission - Nephrology following - Underwent placement of RIJ tunneled HD catheter placement - Underwent second HD session today  Hyperkalemia - in the setting of AKI on CKD stage IV - K 5.2 - Correcting with HD  Nausea - Currently nauseous, but no vomiting - Likely secondary to uremia given worsening renal function - PRN antiemetics  Chronic hypoxic respiratory failure COPD - On 3L home O2 - Stable - Continue home   IBS-C - CT abd/pelvis showed large amount of retained stool throughout colon consistent with constipation - Received lactulose  30 g x 2 on 1/15 with multiple bowel movements  Spinal stenosis Chronic pain - On Oxycontin  10 mg TID at home - PRN oxycodone  and morphine   Scheduled Meds:  amiodarone   200 mg Oral Daily   apixaban   2.5 mg Oral BID   Chlorhexidine  Gluconate Cloth  6 each Topical Q0600   escitalopram   5 mg Oral Daily   furosemide   20 mg Oral BID   patiromer   8.4 g Oral Daily   polyethylene glycol  17 g Oral Daily   tamsulosin   0.4 mg  Oral Daily   Continuous Infusions:  promethazine  (PHENERGAN ) injection (IM or IVPB) 150 mL/hr at 05/24/24 0305   PRN Meds:.acetaminophen  **OR** acetaminophen , ipratropium-albuterol , morphine  injection, ondansetron  **OR** ondansetron  (ZOFRAN ) IV, oxyCODONE , promethazine  (PHENERGAN ) injection (IM or IVPB), traZODone   Current Outpatient Medications  Medication Instructions   albuterol  (PROVENTIL ) (2.5 MG/3ML) 0.083% nebulizer solution 3 mLs, Nebulization, Every 6 hours PRN   amiodarone  (PACERONE ) 200 mg, Oral, Daily, Reduced from twice  daily.  Home med.   apixaban  (ELIQUIS ) 2.5 MG TABS tablet Take by mouth.   budesonide  (PULMICORT ) 1 mg, Daily   Cyanocobalamin  (B-12) 2500 MCG SUBL 1 tablet, Sublingual, Daily   dicyclomine  (BENTYL ) 10 mg, Oral, 3 times daily before meals   erythromycin ophthalmic ointment 1 Application, Both Eyes, Daily at bedtime   escitalopram  (LEXAPRO ) 5 mg, Oral, Daily   fluticasone  (FLONASE ) 50 MCG/ACT nasal spray SPRAY 2 SPRAYS INTO EACH NOSTRIL EVERY DAY   [Paused] furosemide  (LASIX ) 20 mg, Oral, 2 times daily, Resume it after checking kidney function and after discussing with primary care doctor   hydrocortisone  2.5 % cream Apply topically.   ipratropium (ATROVENT ) 0.5 mg, 2 times daily   ipratropium-albuterol  (DUONEB) 0.5-2.5 (3) MG/3ML SOLN 3 mLs, Inhalation, Every 6 hours PRN   mirtazapine  (REMERON ) 15 mg, Daily at bedtime   mometasone -formoterol  (DULERA ) 200-5 MCG/ACT AERO 2 puffs, Inhalation, 2 times daily   Multiple Vitamin (MULTIVITAMIN WITH MINERALS) TABS tablet 1 tablet, Oral, Daily   naloxone  (NARCAN ) 0.4 mg,  Once   Nutritional Supplements (FEEDING SUPPLEMENT, NEPRO CARB STEADY,) LIQD 237 mLs, Oral, 3 times daily between meals   omeprazole (PRILOSEC) 40 mg, Oral, 2 times daily   ondansetron  (ZOFRAN -ODT) 4 mg, Oral, Every 8 hours PRN   oxyCODONE -acetaminophen  (PERCOCET) 10-325 MG tablet 1 tablet, 5 times daily PRN   OxyCONTIN  10 mg, 3 times daily   OXYGEN 2 L, Inhalation, At bedtime PRN   sodium chloride  (OCEAN) 0.65 % SOLN nasal spray 1 spray, Each Nare, As needed   [Paused] spironolactone (ALDACTONE) 25 mg, Daily   tamsulosin  (FLOMAX ) 0.4 mg, Daily   traZODone  (DESYREL ) 25 mg, At bedtime PRN   VENTOLIN  HFA 108 (90 Base) MCG/ACT inhaler 2 puffs, Every 6 hours PRN    DVT prophylaxis: apixaban  (ELIQUIS ) tablet 2.5 mg Start: 05/22/24 1000 apixaban  (ELIQUIS ) tablet 2.5 mg   Code Status:   Code Status: Full Code  Family Communication: Discussed with wife at bedside  Disposition  Plan: Nephrology planning for sequential HD sessions, then likely home after outpatient HD chair arranged PT -   -   OT -   -    Level of care: Telemetry  Consultants:  Nephrology, GI, vascular surgery  Procedures:  None  Antimicrobials: None   Subjective: Patient examined at bedside. Continues to have significant pain from tunneled RIJ catheter. No bowel movements today. Persistent nausea though not getting PRNs. No vomiting. No appetite.   Objective: Vitals:   05/23/24 1922 05/23/24 2310 05/24/24 0337 05/24/24 0849  BP: (!) 145/95 (!) 144/59 (!) 146/61 119/61  Pulse: 81 66 65 69  Resp: 16 16 15 16   Temp: 98.2 F (36.8 C) 97.7 F (36.5 C) 98.1 F (36.7 C) 98.5 F (36.9 C)  TempSrc:  Oral Oral Oral  SpO2: 99% 100% 100% 97%  Weight:      Height:        Intake/Output Summary (Last 24 hours) at 05/24/2024 1019 Last data filed at 05/24/2024 0305 Gross per 24 hour  Intake 100 ml  Output  0 ml  Net 100 ml   Filed Weights   05/21/24 1452 05/23/24 1632  Weight: 62.1 kg 62.2 kg    Examination:  Gen: NAD, A&Ox3 HEENT: NCAT Neck: Supple, RIJ tunneled HD catheter in place CV: RRR, no murmurs Resp: normal WOB, CTAB, no w/r/r Abd: Soft, NTND, no guarding, BS normoactive Ext: trace pedal edema bilaterally, pulses 2+ b/l Skin: Warm, dry, no rashes/lesions Neuro: no focal deficits Psych: Calm, cooperative, appropriate affect   Data Reviewed: I have personally reviewed following labs and imaging studies  CBC: Recent Labs  Lab 05/21/24 1456 05/22/24 0321 05/23/24 0717 05/24/24 0700  WBC 5.4 5.7 5.8 6.8  HGB 7.8* 7.5* 8.0* 8.5*  HCT 24.6* 23.8* 25.1* 26.0*  MCV 107.0* 106.7* 103.7* 103.2*  PLT 163 159 176 182   Basic Metabolic Panel: Recent Labs  Lab 05/21/24 1456 05/22/24 0020 05/22/24 0321 05/23/24 0717 05/24/24 0700  NA 135 137 136 136 136  K 5.5* 5.3* 5.5* 5.5* 5.2*  CL 101 103 102 101 99  CO2 25 25 25 27 29   GLUCOSE 134* 132* 104* 91 95  BUN 58*  56* 56* 49* 33*  CREATININE 5.12* 4.93* 5.01* 4.81* 3.82*  CALCIUM  9.0 8.6* 8.4* 9.2 9.2  PHOS  --   --   --  4.7* 4.2   GFR: Estimated Creatinine Clearance: 13.6 mL/min (A) (by C-G formula based on SCr of 3.82 mg/dL (H)). Liver Function Tests: Recent Labs  Lab 05/21/24 1456 05/23/24 0717 05/24/24 0700  AST 14*  --   --   ALT 9  --   --   ALKPHOS 63  --   --   BILITOT 0.2  --   --   PROT 6.7  --   --   ALBUMIN  4.1 4.0 4.0   No results for input(s): LIPASE, AMYLASE in the last 168 hours. No results for input(s): AMMONIA in the last 168 hours. Coagulation Profile: No results for input(s): INR, PROTIME in the last 168 hours. Cardiac Enzymes: No results for input(s): CKTOTAL, CKMB, CKMBINDEX, TROPONINI in the last 168 hours. BNP (last 3 results) Recent Labs    04/09/24 1059  PROBNP 656.0*   HbA1C: No results for input(s): HGBA1C in the last 72 hours. CBG: No results for input(s): GLUCAP in the last 168 hours. Lipid Profile: No results for input(s): CHOL, HDL, LDLCALC, TRIG, CHOLHDL, LDLDIRECT in the last 72 hours. Thyroid  Function Tests: No results for input(s): TSH, T4TOTAL, FREET4, T3FREE, THYROIDAB in the last 72 hours. Anemia Panel: Recent Labs    05/22/24 0321 05/22/24 1033  VITAMINB12  --  738  FOLATE  --  9.5  FERRITIN 111  --   TIBC 242*  --   IRON  55  --   RETICCTPCT 1.1  --    Sepsis Labs: No results for input(s): PROCALCITON, LATICACIDVEN in the last 168 hours.  No results found for this or any previous visit (from the past 240 hours).   Radiology Studies: PERIPHERAL VASCULAR CATHETERIZATION Result Date: 05/23/2024 See surgical note for result.  DG Chest 1 View Result Date: 05/22/2024 EXAM: 1 VIEW(S) XRAY OF THE CHEST 05/22/2024 03:39:25 PM COMPARISON: 02/22/2024 CLINICAL HISTORY: Acute kidney failure FINDINGS: LINES, TUBES AND DEVICES: Leadless pacemaker in place. LUNGS AND PLEURA: Chronic  hyperinflation. Emphysema. No focal pulmonary opacity. No pleural effusion. No pneumothorax. HEART AND MEDIASTINUM: Aortic atherosclerosis. No acute abnormality of the cardiac and mediastinal silhouettes. BONES AND SOFT TISSUES: No acute osseous abnormality. IMPRESSION: 1. No acute findings. 2.  Emphysema. Electronically signed by: Taylor Stroud MD 05/22/2024 04:09 PM EST RP Workstation: [id number]    Scheduled Meds:  amiodarone   200 mg Oral Daily   apixaban   2.5 mg Oral BID   Chlorhexidine  Gluconate Cloth  6 each Topical Q0600   escitalopram   5 mg Oral Daily   furosemide   20 mg Oral BID   patiromer   8.4 g Oral Daily   polyethylene glycol  17 g Oral Daily   tamsulosin   0.4 mg Oral Daily   Continuous Infusions:  promethazine  (PHENERGAN ) injection (IM or IVPB) 150 mL/hr at 05/24/24 0305     Unresulted Labs (From admission, onward)     Start     Ordered   05/25/24 0500  Renal function panel  Tomorrow morning,   R        05/24/24 1951   05/25/24 0500  CBC  Tomorrow morning,   R        05/24/24 1951             LOS:  LOS: 2 days   Time Spent: 35 minutes  Roland Prine Al-Sultani, MD [facility]  If 7PM-7AM, please contact night-coverage  05/24/2024, 10:19 AM      "

## 2024-05-24 NOTE — Progress Notes (Signed)
 Hemodialysis Note:  Received patient in bed to unit. Alert and oriented. Informed consent singed and in chart.  Treatment initiated: 1350 Treatment completed: 1626  Access used: Right internal jugular catheter Access issues: None  Patient tolerated well. Transported back to room, alert without acute distress. Report given to patient's RN.  Total UF removed: 500 ml Medications given: Morphine  2 mg IV  Post HD weight: 54.8 Kg  [facility]

## 2024-05-25 DIAGNOSIS — D649 Anemia, unspecified: Secondary | ICD-10-CM | POA: Diagnosis not present

## 2024-05-25 DIAGNOSIS — N179 Acute kidney failure, unspecified: Secondary | ICD-10-CM | POA: Diagnosis not present

## 2024-05-25 DIAGNOSIS — J9611 Chronic respiratory failure with hypoxia: Secondary | ICD-10-CM | POA: Diagnosis not present

## 2024-05-25 DIAGNOSIS — E875 Hyperkalemia: Secondary | ICD-10-CM | POA: Diagnosis not present

## 2024-05-25 DIAGNOSIS — N184 Chronic kidney disease, stage 4 (severe): Secondary | ICD-10-CM | POA: Diagnosis not present

## 2024-05-25 DIAGNOSIS — J449 Chronic obstructive pulmonary disease, unspecified: Secondary | ICD-10-CM | POA: Diagnosis not present

## 2024-05-25 LAB — RENAL FUNCTION PANEL
Albumin: 4 g/dL (ref 3.5–5.0)
Anion gap: 8 (ref 5–15)
BUN: 22 mg/dL (ref 8–23)
CO2: 29 mmol/L (ref 22–32)
Calcium: 9.3 mg/dL (ref 8.9–10.3)
Chloride: 97 mmol/L — ABNORMAL LOW (ref 98–111)
Creatinine, Ser: 2.84 mg/dL — ABNORMAL HIGH (ref 0.61–1.24)
GFR, Estimated: 22 mL/min — ABNORMAL LOW
Glucose, Bld: 97 mg/dL (ref 70–99)
Phosphorus: 4 mg/dL (ref 2.5–4.6)
Potassium: 5 mmol/L (ref 3.5–5.1)
Sodium: 134 mmol/L — ABNORMAL LOW (ref 135–145)

## 2024-05-25 LAB — CBC
HCT: 26.2 % — ABNORMAL LOW (ref 39.0–52.0)
Hemoglobin: 8.4 g/dL — ABNORMAL LOW (ref 13.0–17.0)
MCH: 33.5 pg (ref 26.0–34.0)
MCHC: 32.1 g/dL (ref 30.0–36.0)
MCV: 104.4 fL — ABNORMAL HIGH (ref 80.0–100.0)
Platelets: 184 K/uL (ref 150–400)
RBC: 2.51 MIL/uL — ABNORMAL LOW (ref 4.22–5.81)
RDW: 12 % (ref 11.5–15.5)
WBC: 6.8 K/uL (ref 4.0–10.5)
nRBC: 0 % (ref 0.0–0.2)

## 2024-05-25 MED ORDER — OXYCODONE-ACETAMINOPHEN 5-325 MG PO TABS
1.0000 | ORAL_TABLET | ORAL | Status: DC | PRN
  Administered 2024-05-25: 1 via ORAL
  Filled 2024-05-25: qty 1

## 2024-05-25 MED ORDER — PROCHLORPERAZINE EDISYLATE 10 MG/2ML IJ SOLN
10.0000 mg | Freq: Four times a day (QID) | INTRAMUSCULAR | Status: DC | PRN
  Administered 2024-05-25: 10 mg via INTRAVENOUS
  Filled 2024-05-25 (×3): qty 2

## 2024-05-25 MED ORDER — SENNA 8.6 MG PO TABS
2.0000 | ORAL_TABLET | Freq: Every day | ORAL | Status: DC
  Administered 2024-05-25 – 2024-05-28 (×4): 17.2 mg via ORAL
  Filled 2024-05-25 (×4): qty 2

## 2024-05-25 MED ORDER — HEPARIN SODIUM (PORCINE) 5000 UNIT/ML IJ SOLN
5000.0000 [IU] | Freq: Three times a day (TID) | INTRAMUSCULAR | Status: DC
  Administered 2024-05-25 – 2024-05-29 (×8): 5000 [IU] via SUBCUTANEOUS
  Filled 2024-05-25 (×9): qty 1

## 2024-05-25 MED ORDER — OXYCODONE HCL ER 10 MG PO T12A
10.0000 mg | EXTENDED_RELEASE_TABLET | Freq: Three times a day (TID) | ORAL | Status: DC
  Administered 2024-05-25 – 2024-05-29 (×11): 10 mg via ORAL
  Filled 2024-05-25 (×11): qty 1

## 2024-05-25 MED ORDER — PEG 3350-KCL-NA BICARB-NACL 420 G PO SOLR
4000.0000 mL | Freq: Once | ORAL | Status: DC
  Filled 2024-05-25: qty 4000

## 2024-05-25 MED ORDER — OXYCODONE HCL 5 MG PO TABS
5.0000 mg | ORAL_TABLET | ORAL | Status: DC | PRN
  Administered 2024-05-25 – 2024-05-27 (×2): 5 mg via ORAL
  Filled 2024-05-25 (×2): qty 1

## 2024-05-25 NOTE — Progress Notes (Signed)
 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  Outpatient labs on 05/19/2024 showed a hemoglobin of 7.7 (down from baseline of 8.7) and creatinine of 5 (up from 4.4 on 12/19).  Notably, the patient had been complaining of several weeks of intractable nausea and vomiting, decreased p.o. intake, worsening shortness of breath and DOE, with dark stools for the last 10 days and an episode of bright red blood while straining.   In the ED, he was afebrile with temp of 99.1 F, HR 65, RR 18, BP 117/53, SpO2 100% on 3 L nasal cannula.  CBC showed Hgb 7.8.  CMP notable for potassium 5.5, creatinine 5.12.   CT abdomen pelvis showed large amount of retained stool throughout the colon consistent with constipation but no bowel obstruction or ileus and a nonobstructing 5 mm left renal calculus. The patient was given an NS bolus and IV Reglan  for nausea.  Admitted for further management.  Assessment and Plan:  AKI on CKD Stage IV - Follows with Dr. Dominica in nephrology clinic. Was sent to the hospital due to concern for AKI on CKD stage IV and possible initiation of RRT in the setting of several weeks of N/V and decreased PO intake - Cr 5.12 on admission - Nephrology following - Underwent placement of RIJ tunneled HD catheter placement - Underwent second HD on 1/17  Macrocytic anemia, chronic Anemia of CKD Concern for melena - Patient presenting with generalized weakness, SOB, DOE, reports of dark stools for 10 days - Possibly progression of  anemia of CKD vs GI blood loss - Last EGD (10/2023) showed gastritis - Last colonoscopy (05/2021) showed a single nonbleeding colonic angiectasia treated with APC - FOBT ordered in ED, never collected - Anemia panel c/w anemia of CKD.  - Unclear if patient's stool are truly melanotic. Patient instructed to inform RN staff of BM in order to confirm stool, had none today.  - GI following, tentatively planned for EGD and colonoscopy on Monday 1/19 - Nephrology following - Hgb stable 8.4  Hyperkalemia - resolved - In the setting of AKI on CKD stage IV - K 5.2 - Correcting with HD  Nausea - Currently nauseous, but no vomiting - Has longstanding history of nausea with mostly negative workup previously including normal GES and normal EGD. Query where worsened by uremia given worsening renal function - PRN Zofran  and Phenergan  - Ordered PRN compazine   Chronic hypoxic respiratory failure COPD - On 3L home O2 - Stable - Continue home   Paroxysmal Afib - In NSR - Continue home amiodarone  200 mg daily - Eliquis  held for EGD/colonoscopy on 1/21  IBS-C - CT abd/pelvis showed large amount of retained stool throughout colon consistent with constipation - Received lactulose  30 g x 2 on 1/15 with multiple bowel movements - Miralax  daily, senna 2 tabs nightly  Spinal stenosis Chronic pain - On Oxycontin  10 mg TID and Percocet 10-325 q4h PRN - PDMP reviewed - Resumed home pain regimen  BPH - Continue home flomax  0.4 mg daily  Scheduled Meds:  amiodarone   200 mg Oral Daily   Chlorhexidine  Gluconate Cloth  6 each Topical Q0600   escitalopram   5 mg Oral Daily   polyethylene glycol  17 g Oral Daily   polyethylene glycol-electrolytes  4,000 mL Oral Once   tamsulosin   0.4 mg Oral Daily   Continuous Infusions:  sodium chloride  Stopped (05/24/24 1324)   promethazine  (PHENERGAN ) injection (IM or IVPB) 150 mL/hr at 05/24/24 0305   PRN Meds:.acetaminophen  **OR** acetaminophen ,  ipratropium-albuterol , morphine  injection, ondansetron  **OR** ondansetron  (ZOFRAN ) IV, oxyCODONE , promethazine  (PHENERGAN ) injection (IM or IVPB), traZODone   Current Outpatient Medications  Medication Instructions   albuterol  (PROVENTIL ) (2.5 MG/3ML) 0.083% nebulizer solution 3 mLs, Nebulization, Every 6 hours PRN   amiodarone  (PACERONE ) 200 mg, Oral, Daily, Reduced from twice daily.  Home med.   apixaban  (ELIQUIS ) 2.5 MG TABS tablet Take by mouth.   budesonide  (PULMICORT ) 1 mg, Daily   Cyanocobalamin  (B-12) 2500 MCG SUBL 1 tablet, Sublingual, Daily   dicyclomine  (BENTYL ) 10 mg, Oral, 3 times daily before meals   erythromycin ophthalmic ointment 1 Application, Both Eyes, Daily at bedtime   escitalopram  (LEXAPRO ) 5 mg, Oral, Daily   fluticasone  (FLONASE ) 50 MCG/ACT nasal spray SPRAY 2 SPRAYS INTO EACH NOSTRIL EVERY DAY   [Paused] furosemide  (LASIX ) 20 mg, Oral, 2 times daily, Resume it after checking kidney function and after discussing with primary care doctor   hydrocortisone  2.5 % cream Apply topically.   ipratropium (ATROVENT ) 0.5 mg, 2 times daily   ipratropium-albuterol  (DUONEB) 0.5-2.5 (3) MG/3ML SOLN 3 mLs, Inhalation, Every 6 hours PRN   mirtazapine  (REMERON ) 15 mg, Daily at bedtime   mometasone -formoterol  (DULERA ) 200-5 MCG/ACT AERO 2 puffs, Inhalation, 2 times daily   Multiple Vitamin (MULTIVITAMIN WITH MINERALS) TABS tablet 1 tablet, Oral, Daily   naloxone  (NARCAN ) 0.4 mg,  Once   Nutritional Supplements (FEEDING SUPPLEMENT, NEPRO CARB STEADY,) LIQD 237 mLs, Oral, 3 times daily between meals   omeprazole (PRILOSEC) 40 mg, Oral, 2 times daily   ondansetron  (ZOFRAN -ODT) 4 mg, Oral, Every 8 hours PRN   oxyCODONE -acetaminophen  (PERCOCET) 10-325 MG tablet 1 tablet, 5 times daily PRN   OxyCONTIN  10 mg, 3 times daily   OXYGEN 2 L, Inhalation, At bedtime PRN   sodium chloride  (OCEAN) 0.65 % SOLN nasal spray 1 spray, Each Nare, As needed   [Paused] spironolactone (ALDACTONE) 25 mg,  Daily   tamsulosin  (FLOMAX ) 0.4 mg, Daily   traZODone  (DESYREL ) 25 mg, At bedtime PRN   VENTOLIN  HFA 108 (90 Base) MCG/ACT inhaler 2 puffs, Every 6 hours PRN    DVT prophylaxis: Eliquis  held for colonoscopy/EGD on 1/21. heparin  injection 5,000 Units Start: 05/25/24 1730     Code Status:   Code Status: Full Code  Family Communication: Discussed with wife at bedside  Disposition Plan: Nephrology planning for sequential HD sessions, then likely home after outpatient HD chair arranged PT -   -   OT -   -    Level of care: Telemetry  Consultants:  Nephrology, GI, vascular surgery  Procedures:  None  Antimicrobials: None   Subjective: Patient examined at bedside. Pain at [facility] site is improved. Very nauseous today. Zofran  not helping. Eliquis  was given last night so will not be able to get EGD/colonoscopy until Thursday 1/21. States pain meds are not adequate, prefers home regimen. Wants to try a full liquid diet.   Objective: Vitals:   05/24/24 2335 05/25/24 0409 05/25/24 0559 05/25/24 0737  BP: (!) 111/39 124/60  (!) 110/54  Pulse: 68 65  74  Resp: 16 18  18   Temp: 97.9 F (36.6 C) 98 F (36.7 C)  98.3 F (36.8 C)  TempSrc: Oral   Oral  SpO2: 100% 100%  96%  Weight:   56.1 kg   Height:        Intake/Output Summary (Last 24 hours) at 05/25/2024 1009 Last data filed at 05/25/2024 0440 Gross per 24 hour  Intake 120.33 ml  Output 500 ml  Net -379.67 ml   Filed Weights   05/24/24 1343 05/24/24 1626 05/25/24 0559  Weight: 55.3 kg 54.8 kg 56.1 kg    Examination:  Gen: NAD, A&Ox3, fatigued  HEENT: NCAT Neck: Supple, RIJ tunneled HD catheter in place CV: RRR, no murmurs Resp: normal WOB, CTAB, no w/r/r Abd: Soft, NTND, no guarding Ext: trace pedal edema bilaterally, pulses 2+ b/l Skin: Warm, dry, no rashes/lesions Neuro: no focal deficits Psych: Calm, cooperative, appropriate affect   Data Reviewed: I have personally reviewed following labs and imaging  studies  CBC: Recent Labs  Lab 05/21/24 1456 05/22/24 0321 05/23/24 0717 05/24/24 0700 05/25/24 0513  WBC 5.4 5.7 5.8 6.8 6.8  HGB 7.8* 7.5* 8.0* 8.5* 8.4*  HCT 24.6* 23.8* 25.1* 26.0* 26.2*  MCV 107.0* 106.7* 103.7* 103.2* 104.4*  PLT 163 159 176 182 184   Basic Metabolic Panel: Recent Labs  Lab 05/22/24 0020 05/22/24 0321 05/23/24 0717 05/24/24 0700 05/25/24 0513  NA 137 136 136 136 134*  K 5.3* 5.5* 5.5* 5.2* 5.0  CL 103 102 101 99 97*  CO2 25 25 27 29 29   GLUCOSE 132* 104* 91 95 97  BUN 56* 56* 49* 33* 22  CREATININE 4.93* 5.01* 4.81* 3.82* 2.84*  CALCIUM  8.6* 8.4* 9.2 9.2 9.3  PHOS  --   --  4.7* 4.2 4.0   GFR: Estimated Creatinine Clearance: 16.5 mL/min (A) (by C-G formula based on SCr of 2.84 mg/dL (H)). Liver Function Tests: Recent Labs  Lab 05/21/24 1456 05/23/24 0717 05/24/24 0700 05/25/24 0513  AST 14*  --   --   --   ALT 9  --   --   --   ALKPHOS 63  --   --   --   BILITOT 0.2  --   --   --   PROT 6.7  --   --   --   ALBUMIN  4.1 4.0 4.0 4.0   No results for input(s): LIPASE, AMYLASE in the last 168 hours. No results for input(s): AMMONIA in the last 168 hours. Coagulation Profile: No results for input(s): INR, PROTIME in the last 168 hours. Cardiac Enzymes: No results for input(s): CKTOTAL, CKMB, CKMBINDEX, TROPONINI in the last 168 hours. BNP (last 3 results) Recent Labs    04/09/24 1059  PROBNP 656.0*   HbA1C: No results for input(s): HGBA1C in the last 72 hours. CBG: No results for input(s): GLUCAP in the last 168 hours. Lipid Profile: No results for input(s): CHOL, HDL, LDLCALC, TRIG, CHOLHDL, LDLDIRECT in the last 72 hours. Thyroid  Function Tests: No results for input(s): TSH, T4TOTAL, FREET4, T3FREE, THYROIDAB in the last 72 hours. Anemia Panel: Recent Labs    05/22/24 1033  VITAMINB12 738  FOLATE 9.5   Sepsis Labs: No results for input(s): PROCALCITON, LATICACIDVEN in the  last 168 hours.  No results found for this or any previous visit (from the past 240 hours).   Radiology Studies: PERIPHERAL VASCULAR CATHETERIZATION Result Date: 05/23/2024 See surgical note for result.   Scheduled Meds:  amiodarone   200 mg Oral Daily   Chlorhexidine  Gluconate Cloth  6 each Topical Q0600   escitalopram   5 mg Oral Daily   polyethylene glycol  17 g Oral Daily   polyethylene glycol-electrolytes  4,000 mL Oral Once   tamsulosin   0.4 mg Oral Daily   Continuous Infusions:  sodium chloride  Stopped (05/24/24 1324)   promethazine  (PHENERGAN ) injection (IM or IVPB) 150 mL/hr at 05/24/24 0305     Unresulted Labs (From admission, onward)    None        LOS:  LOS: 3 days   Time Spent: 35 minutes  Enis Riecke Al-Sultani, MD [facility]  If 7PM-7AM, please contact night-coverage  05/25/2024, 10:09 AM      "

## 2024-05-25 NOTE — Plan of Care (Signed)

## 2024-05-25 NOTE — Progress Notes (Signed)
 Was informed pt had a dose of eloquis yesterday . Hence need to reschedule procedures to wednesday - off eloquis for 3 days due to CKD.   Dr Ruel Kung MD,MRCP [facility]) Gastroenterology/Hepatology Pager: [phone]

## 2024-05-25 NOTE — Progress Notes (Signed)
 " [facility]  PROGRESS NOTE   Subjective:   Resting in bed.  Family at bedside.  Feels much better today.  Objective:  Vital signs: Blood pressure 128/64, pulse 69, temperature 98.3 F (36.8 C), temperature source Oral, resp. rate 17, height 6' (1.829 m), weight 56.1 kg, SpO2 100%.  Intake/Output Summary (Last 24 hours) at 05/25/2024 1556 Last data filed at 05/25/2024 1500 Gross per 24 hour  Intake 360.33 ml  Output 500 ml  Net -139.67 ml   Filed Weights   05/24/24 1343 05/24/24 1626 05/25/24 0559  Weight: 55.3 kg 54.8 kg 56.1 kg     Physical Exam: General:  No acute distress  Head:  Normocephalic, atraumatic. Moist oral mucosal membranes  Eyes:  Anicteric  Neck:  Supple  Lungs:   Clear to auscultation, normal effort  Heart:  S1S2 no rubs  Abdomen:   Soft, nontender, bowel sounds present  Extremities:  peripheral edema.  Neurologic:  Awake, alert, following commands  Skin:  No lesions  Access:     Basic Metabolic Panel: Recent Labs  Lab 05/22/24 0020 05/22/24 0321 05/23/24 0717 05/24/24 0700 05/25/24 0513  NA 137 136 136 136 134*  K 5.3* 5.5* 5.5* 5.2* 5.0  CL 103 102 101 99 97*  CO2 25 25 27 29 29   GLUCOSE 132* 104* 91 95 97  BUN 56* 56* 49* 33* 22  CREATININE 4.93* 5.01* 4.81* 3.82* 2.84*  CALCIUM  8.6* 8.4* 9.2 9.2 9.3  PHOS  --   --  4.7* 4.2 4.0   GFR: Estimated Creatinine Clearance: 16.5 mL/min (A) (by C-G formula based on SCr of 2.84 mg/dL (H)).  Liver Function Tests: Recent Labs  Lab 05/21/24 1456 05/23/24 0717 05/24/24 0700 05/25/24 0513  AST 14*  --   --   --   ALT 9  --   --   --   ALKPHOS 63  --   --   --   BILITOT 0.2  --   --   --   PROT 6.7  --   --   --   ALBUMIN  4.1 4.0 4.0 4.0   No results for input(s): LIPASE, AMYLASE in the last 168 hours. No results for input(s): AMMONIA in the last 168 hours.  CBC: Recent Labs  Lab 05/21/24 1456 05/22/24 0321 05/23/24 0717 05/24/24 0700 05/25/24 0513  WBC 5.4  5.7 5.8 6.8 6.8  HGB 7.8* 7.5* 8.0* 8.5* 8.4*  HCT 24.6* 23.8* 25.1* 26.0* 26.2*  MCV 107.0* 106.7* 103.7* 103.2* 104.4*  PLT 163 159 176 182 184     HbA1C: Hgb A1c MFr Bld  Date/Time Value Ref Range Status  12/05/2023 04:36 PM 5.6 4.8 - 5.6 % Final    Comment:    (NOTE) Diagnosis of Diabetes The following HbA1c ranges recommended by the American Diabetes Association (ADA) may be used as an aid in the diagnosis of diabetes mellitus.  Hemoglobin             Suggested A1C NGSP%              Diagnosis  <5.7                   Non Diabetic  5.7-6.4                Pre-Diabetic  >6.4                   Diabetic  <7.0  Glycemic control for                       adults with diabetes.    05/10/2021 07:01 PM 5.6 4.8 - 5.6 % Final    Comment:    (NOTE) Pre diabetes:          5.7%-6.4%  Diabetes:              >6.4%  Glycemic control for   <7.0% adults with diabetes     Urinalysis: No results for input(s): COLORURINE, LABSPEC, PHURINE, GLUCOSEU, HGBUR, BILIRUBINUR, KETONESUR, PROTEINUR, UROBILINOGEN, NITRITE, LEUKOCYTESUR in the last 72 hours.  Invalid input(s): APPERANCEUR    Imaging: No results found.   Medications:    promethazine  (PHENERGAN ) injection (IM or IVPB) 12.5 mg (05/25/24 1107)    amiodarone   200 mg Oral Daily   Chlorhexidine  Gluconate Cloth  6 each Topical Q0600   escitalopram   5 mg Oral Daily   oxyCODONE   10 mg Oral TID   polyethylene glycol  17 g Oral Daily   [START ON 05/28/2024] polyethylene glycol-electrolytes  4,000 mL Oral Once   senna  2 tablet Oral QHS   tamsulosin   0.4 mg Oral Daily    Assessment/ Plan:     79 year old male with PMHx of paroxysmal A-fib on Eliquis , complete heart block s/p PPM, CKD stage IV, COPD, PVD, BPH,, IBS-C, GERD, chronic pain syndrome who presented to the ED on 05/21/2024 with worsening shortness of breath and weakness.  He also had bright red blood per rectum and his  hemoglobin was around 7.   #1: Acute kidney injury on CKD: Since patient had symptoms of nausea, vomiting along with worsening renal indices he was initiated on dialysis.  Will dialyze him again tomorrow.   #2: COPD exacerbation: Will continue the inhalers as tolerated.  On nasal cannula oxygen.   #3: Anemia: Patient is status post blood transfusion.  GI workup in progress.  Possible scope this week.   #4: Secondary hyperparathyroidism: Will check PTH, calcium  and phosphorus levels.   #5: Hyperkalemia: Potassium should improve with dialysis.  Advised on low potassium diet.  Spoke to patient's wife at bedside. Labs and medications reviewed. Will continue to follow along with you.   LOS: 3 Pinkey Edman, MD [facility] 1/18/20263:56 PM  "

## 2024-05-26 ENCOUNTER — Encounter: Payer: Self-pay | Admitting: Vascular Surgery

## 2024-05-26 DIAGNOSIS — N179 Acute kidney failure, unspecified: Secondary | ICD-10-CM | POA: Diagnosis not present

## 2024-05-26 DIAGNOSIS — J449 Chronic obstructive pulmonary disease, unspecified: Secondary | ICD-10-CM | POA: Diagnosis not present

## 2024-05-26 DIAGNOSIS — N184 Chronic kidney disease, stage 4 (severe): Secondary | ICD-10-CM | POA: Diagnosis not present

## 2024-05-26 DIAGNOSIS — J9611 Chronic respiratory failure with hypoxia: Secondary | ICD-10-CM | POA: Diagnosis not present

## 2024-05-26 DIAGNOSIS — D649 Anemia, unspecified: Secondary | ICD-10-CM | POA: Diagnosis not present

## 2024-05-26 DIAGNOSIS — E875 Hyperkalemia: Secondary | ICD-10-CM | POA: Diagnosis not present

## 2024-05-26 LAB — CBC
HCT: 25.4 % — ABNORMAL LOW (ref 39.0–52.0)
Hemoglobin: 8.4 g/dL — ABNORMAL LOW (ref 13.0–17.0)
MCH: 33.9 pg (ref 26.0–34.0)
MCHC: 33.1 g/dL (ref 30.0–36.0)
MCV: 102.4 fL — ABNORMAL HIGH (ref 80.0–100.0)
Platelets: 164 K/uL (ref 150–400)
RBC: 2.48 MIL/uL — ABNORMAL LOW (ref 4.22–5.81)
RDW: 11.9 % (ref 11.5–15.5)
WBC: 5.6 K/uL (ref 4.0–10.5)
nRBC: 0 % (ref 0.0–0.2)

## 2024-05-26 LAB — RENAL FUNCTION PANEL
Albumin: 3.9 g/dL (ref 3.5–5.0)
Anion gap: 10 (ref 5–15)
BUN: 34 mg/dL — ABNORMAL HIGH (ref 8–23)
CO2: 28 mmol/L (ref 22–32)
Calcium: 8.5 mg/dL — ABNORMAL LOW (ref 8.9–10.3)
Chloride: 99 mmol/L (ref 98–111)
Creatinine, Ser: 3.68 mg/dL — ABNORMAL HIGH (ref 0.61–1.24)
GFR, Estimated: 16 mL/min — ABNORMAL LOW
Glucose, Bld: 91 mg/dL (ref 70–99)
Phosphorus: 4.2 mg/dL (ref 2.5–4.6)
Potassium: 4.8 mmol/L (ref 3.5–5.1)
Sodium: 136 mmol/L (ref 135–145)

## 2024-05-26 MED ORDER — EPOETIN ALFA-EPBX 4000 UNIT/ML IJ SOLN
4000.0000 [IU] | INTRAMUSCULAR | Status: DC
  Administered 2024-05-26 – 2024-05-29 (×3): 4000 [IU] via INTRAVENOUS
  Filled 2024-05-26: qty 1

## 2024-05-26 MED ORDER — MORPHINE SULFATE (PF) 2 MG/ML IV SOLN
INTRAVENOUS | Status: AC
  Filled 2024-05-26: qty 1

## 2024-05-26 MED ORDER — LACTULOSE 10 GM/15ML PO SOLN
20.0000 g | Freq: Two times a day (BID) | ORAL | Status: DC
  Administered 2024-05-26 – 2024-05-27 (×2): 20 g via ORAL
  Filled 2024-05-26 (×3): qty 30

## 2024-05-26 MED ORDER — FLUTICASONE FUROATE-VILANTEROL 200-25 MCG/ACT IN AEPB
1.0000 | INHALATION_SPRAY | Freq: Every day | RESPIRATORY_TRACT | Status: DC
  Administered 2024-05-26 – 2024-05-27 (×2): 1 via RESPIRATORY_TRACT
  Filled 2024-05-26: qty 28

## 2024-05-26 MED ORDER — EPOETIN ALFA 4000 UNIT/ML IJ SOLN
INTRAMUSCULAR | Status: AC
  Filled 2024-05-26: qty 1

## 2024-05-26 MED ORDER — ENSURE PLUS HIGH PROTEIN PO LIQD
237.0000 mL | Freq: Two times a day (BID) | ORAL | Status: DC
  Administered 2024-05-26 – 2024-05-27 (×2): 237 mL via ORAL

## 2024-05-26 NOTE — Progress Notes (Signed)
 Requested to see pt for outpt HD needs at d/c.  Met with pt bedside. Introduced self and explained role. Discussed HD options. Pt prefers [name] [name]. Referral submitted to  [facility] for review.  Suzen Satchel Dialysis Navigator [phone]

## 2024-05-26 NOTE — Procedures (Signed)
 Post Hemodialysis Report:   Treatment Date: 05/26/24   Treatment Length: 2.5 hours of a prescribed 3 hour tx  Patient Access: Right HD CVC  Treatment ended early due to patient request, patient stating that he didn't feel good and wanted to go back to his room. PRN Morphine  and tylenol  were given during treatment, blood pressure dropped to 70s after morphine  dose, 400ml saline bolus given, blood pressure stabilized. Patient remained alert/oriented during hypotensive episode.Scheduled Epogen  dose given of 4000units. Access functioned well. Net removal=1.2liters.    Post Vitals:  Temp: 97.8 Temp Source: oral Heart Rate: 52 Resp: 14 B/P: 113/44(64) O2: 99% Nasal Cannula 3 liters

## 2024-05-26 NOTE — Progress Notes (Signed)
 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  Outpatient labs on 05/19/2024 showed a hemoglobin of 7.7 (down from baseline of 8.7) and creatinine of 5 (up from 4.4 on 12/19).  Notably, the patient had been complaining of several weeks of intractable nausea and vomiting, decreased p.o. intake, worsening shortness of breath and DOE, with dark stools for the last 10 days and an episode of bright red blood while straining.   In the ED, he was afebrile with temp of 99.1 F, HR 65, RR 18, BP 117/53, SpO2 100% on 3 L nasal cannula.  CBC showed Hgb 7.8.  CMP notable for potassium 5.5, creatinine 5.12.   CT abdomen pelvis showed large amount of retained stool throughout the colon consistent with constipation but no bowel obstruction or ileus and a nonobstructing 5 mm left renal calculus. The patient was given an NS bolus and IV Reglan  for nausea.  Admitted for further management.  Assessment and Plan:  AKI on CKD Stage IV - Follows with Dr. Dominica in nephrology clinic. Was sent to the hospital due to concern for AKI on CKD stage IV and possible initiation of RRT in the setting of several weeks of N/V and decreased PO intake - Cr 5.12 on admission - Nephrology following - Underwent placement of RIJ tunneled HD catheter placement - Underwent second HD on 1/17 - Plan for HD today per nephrology  Macrocytic anemia, chronic Anemia of CKD Concern for melena - Patient presenting with generalized weakness, SOB, DOE, reports of dark stools  for 10 days - Possibly progression of anemia of CKD vs GI blood loss - Last EGD (10/2023) showed gastritis - Last colonoscopy (05/2021) showed a single nonbleeding colonic angiectasia treated with APC - FOBT ordered in ED, never collected - Anemia panel c/w anemia of CKD.  - Unclear if patient's stool are truly melanotic. Patient instructed to inform RN staff of BM in order to confirm stool, had none today.  - GI following, tentatively planned for EGD and colonoscopy on Monday 1/19 - Nephrology following - Hgb stable  Hyperkalemia - resolved - In the setting of AKI on CKD stage IV - Correcting with HD  Nausea - Currently nauseous, but no vomiting - Has longstanding history of nausea with mostly negative workup previously including normal GES and normal EGD. Query where worsened by uremia given worsening renal function - PRN Zofran , PRN Phenergan , PRN compazine   Chronic hypoxic respiratory failure COPD - On 3L home O2 - Stable - Continue home inhalers formulary equivalent  Paroxysmal Afib - In NSR - Continue home amiodarone  200 mg daily - Eliquis  held for EGD/colonoscopy on 1/21  IBS-C - CT abd/pelvis showed large amount of retained stool throughout colon consistent with constipation - Received lactulose  30 g x 2 on 1/15 with multiple bowel movements - Miralax  daily, senna 2 tabs nightly - Ordered lactulose  20 g BID until he has a bowel movement  Spinal stenosis Chronic pain - On Oxycontin  10 mg TID and Percocet 10-325 q4h PRN - PDMP reviewed -  Continue home pain regimen  BPH - Continue home flomax  0.4 mg daily  Scheduled Meds:  amiodarone   200 mg Oral Daily   Chlorhexidine  Gluconate Cloth  6 each Topical Q0600   escitalopram   5 mg Oral Daily   feeding supplement  237 mL Oral BID BM   heparin   5,000 Units Subcutaneous Q8H   oxyCODONE   10 mg Oral TID   polyethylene glycol  17 g Oral Daily   [START ON 05/28/2024] polyethylene glycol-electrolytes  4,000 mL Oral Once    senna  2 tablet Oral QHS   tamsulosin   0.4 mg Oral Daily   Continuous Infusions:  promethazine  (PHENERGAN ) injection (IM or IVPB) 12.5 mg (05/26/24 0501)   PRN Meds:.acetaminophen  **OR** acetaminophen , ipratropium-albuterol , morphine  injection, ondansetron  **OR** ondansetron  (ZOFRAN ) IV, oxyCODONE -acetaminophen  **AND** oxyCODONE , prochlorperazine , promethazine  (PHENERGAN ) injection (IM or IVPB), traZODone   Current Outpatient Medications  Medication Instructions   albuterol  (PROVENTIL ) (2.5 MG/3ML) 0.083% nebulizer solution 3 mLs, Nebulization, Every 6 hours PRN   amiodarone  (PACERONE ) 200 mg, Oral, Daily, Reduced from twice daily.  Home med.   apixaban  (ELIQUIS ) 2.5 MG TABS tablet Take by mouth.   budesonide  (PULMICORT ) 1 mg, Daily   Cyanocobalamin  (B-12) 2500 MCG SUBL 1 tablet, Sublingual, Daily   dicyclomine  (BENTYL ) 10 mg, Oral, 3 times daily before meals   erythromycin ophthalmic ointment 1 Application, Both Eyes, Daily at bedtime   escitalopram  (LEXAPRO ) 5 mg, Oral, Daily   fluticasone  (FLONASE ) 50 MCG/ACT nasal spray SPRAY 2 SPRAYS INTO EACH NOSTRIL EVERY DAY   [Paused] furosemide  (LASIX ) 20 mg, Oral, 2 times daily, Resume it after checking kidney function and after discussing with primary care doctor   hydrocortisone  2.5 % cream Apply topically.   ipratropium (ATROVENT ) 0.5 mg, 2 times daily   ipratropium-albuterol  (DUONEB) 0.5-2.5 (3) MG/3ML SOLN 3 mLs, Inhalation, Every 6 hours PRN   mirtazapine  (REMERON ) 15 mg, Daily at bedtime   mometasone -formoterol  (DULERA ) 200-5 MCG/ACT AERO 2 puffs, Inhalation, 2 times daily   Multiple Vitamin (MULTIVITAMIN WITH MINERALS) TABS tablet 1 tablet, Oral, Daily   naloxone  (NARCAN ) 0.4 mg,  Once   Nutritional Supplements (FEEDING SUPPLEMENT, NEPRO CARB STEADY,) LIQD 237 mLs, Oral, 3 times daily between meals   omeprazole (PRILOSEC) 40 mg, Oral, 2 times daily   ondansetron  (ZOFRAN -ODT) 4 mg, Oral, Every 8 hours PRN   oxyCODONE -acetaminophen   (PERCOCET) 10-325 MG tablet 1 tablet, 5 times daily PRN   OxyCONTIN  10 mg, 3 times daily   OXYGEN 2 L, Inhalation, At bedtime PRN   sodium chloride  (OCEAN) 0.65 % SOLN nasal spray 1 spray, Each Nare, As needed   [Paused] spironolactone (ALDACTONE) 25 mg, Daily   tamsulosin  (FLOMAX ) 0.4 mg, Daily   traZODone  (DESYREL ) 25 mg, At bedtime PRN   VENTOLIN  HFA 108 (90 Base) MCG/ACT inhaler 2 puffs, Every 6 hours PRN    DVT prophylaxis: heparin  injection 5,000 Units Start: 05/25/24 1730Eliquis held for colonoscopy/EGD on 1/21. heparin  injection 5,000 Units Start: 05/25/24 1730     Code Status:   Code Status: Full Code  Family Communication: Discussed with wife at bedside  Disposition Plan: Nephrology planning for sequential HD sessions, then likely home after outpatient HD chair arranged PT -   -   OT -   -    Level of care: Telemetry  Consultants:  Nephrology, GI, vascular surgery  Procedures:  None  Antimicrobials: None   Subjective: Patient examined at bedside. More comfortable today. Had improvement with nausea with med changes. Was able to eat some  tomato soup yesterday. Ate some eggs this morning and it didn't sit well with him. No bowel movements yet.   Objective: Vitals:   05/25/24 2050 05/25/24 2356 05/26/24 0340 05/26/24 0806  BP: (!) 112/42 (!) 120/57 126/61 (!) 125/59  Pulse: 63 66 69 65  Resp: 18 18 16 16   Temp: 98 F (36.7 C) 97.7 F (36.5 C) 97.8 F (36.6 C) 97.8 F (36.6 C)  TempSrc: Oral Oral Oral   SpO2: 99% 100% 100% 100%  Weight:      Height:        Intake/Output Summary (Last 24 hours) at 05/26/2024 1026 Last data filed at 05/25/2024 1500 Gross per 24 hour  Intake 240 ml  Output --  Net 240 ml   Filed Weights   05/24/24 1343 05/24/24 1626 05/25/24 0559  Weight: 55.3 kg 54.8 kg 56.1 kg    Examination:  Gen: NAD, A&Ox3, fatigued  HEENT: NCAT Neck: Supple, RIJ tunneled HD catheter in place CV: RRR, no murmurs Resp: normal WOB, CTAB, no  w/r/r Abd: Soft, NTND, no guarding Ext: trace pedal edema bilaterally, pulses 2+ b/l Skin: Warm, dry, no rashes/lesions Neuro: no focal deficits Psych: Calm, cooperative, appropriate affect   Data Reviewed: I have personally reviewed following labs and imaging studies  CBC: Recent Labs  Lab 05/21/24 1456 05/22/24 0321 05/23/24 0717 05/24/24 0700 05/25/24 0513  WBC 5.4 5.7 5.8 6.8 6.8  HGB 7.8* 7.5* 8.0* 8.5* 8.4*  HCT 24.6* 23.8* 25.1* 26.0* 26.2*  MCV 107.0* 106.7* 103.7* 103.2* 104.4*  PLT 163 159 176 182 184   Basic Metabolic Panel: Recent Labs  Lab 05/22/24 0020 05/22/24 0321 05/23/24 0717 05/24/24 0700 05/25/24 0513  NA 137 136 136 136 134*  K 5.3* 5.5* 5.5* 5.2* 5.0  CL 103 102 101 99 97*  CO2 25 25 27 29 29   GLUCOSE 132* 104* 91 95 97  BUN 56* 56* 49* 33* 22  CREATININE 4.93* 5.01* 4.81* 3.82* 2.84*  CALCIUM  8.6* 8.4* 9.2 9.2 9.3  PHOS  --   --  4.7* 4.2 4.0   GFR: Estimated Creatinine Clearance: 16.5 mL/min (A) (by C-G formula based on SCr of 2.84 mg/dL (H)). Liver Function Tests: Recent Labs  Lab 05/21/24 1456 05/23/24 0717 05/24/24 0700 05/25/24 0513  AST 14*  --   --   --   ALT 9  --   --   --   ALKPHOS 63  --   --   --   BILITOT 0.2  --   --   --   PROT 6.7  --   --   --   ALBUMIN  4.1 4.0 4.0 4.0   No results for input(s): LIPASE, AMYLASE in the last 168 hours. No results for input(s): AMMONIA in the last 168 hours. Coagulation Profile: No results for input(s): INR, PROTIME in the last 168 hours. Cardiac Enzymes: No results for input(s): CKTOTAL, CKMB, CKMBINDEX, TROPONINI in the last 168 hours. BNP (last 3 results) Recent Labs    04/09/24 1059  PROBNP 656.0*   HbA1C: No results for input(s): HGBA1C in the last 72 hours. CBG: No results for input(s): GLUCAP in the last 168 hours. Lipid Profile: No results for input(s): CHOL, HDL, LDLCALC, TRIG, CHOLHDL, LDLDIRECT in the last 72 hours. Thyroid   Function Tests: No results for input(s): TSH, T4TOTAL, FREET4, T3FREE, THYROIDAB in the last 72 hours. Anemia Panel: No results for input(s): VITAMINB12, FOLATE, FERRITIN, TIBC, IRON , RETICCTPCT in the last 72 hours.  Sepsis Labs:  No results for input(s): PROCALCITON, LATICACIDVEN in the last 168 hours.  No results found for this or any previous visit (from the past 240 hours).   Radiology Studies: No results found.   Scheduled Meds:  amiodarone   200 mg Oral Daily   Chlorhexidine  Gluconate Cloth  6 each Topical Q0600   escitalopram   5 mg Oral Daily   feeding supplement  237 mL Oral BID BM   heparin   5,000 Units Subcutaneous Q8H   oxyCODONE   10 mg Oral TID   polyethylene glycol  17 g Oral Daily   [START ON 05/28/2024] polyethylene glycol-electrolytes  4,000 mL Oral Once   senna  2 tablet Oral QHS   tamsulosin   0.4 mg Oral Daily   Continuous Infusions:  promethazine  (PHENERGAN ) injection (IM or IVPB) 12.5 mg (05/26/24 0501)     Unresulted Labs (From admission, onward)     Start     Ordered   05/27/24 0500  CBC  Tomorrow morning,   R        05/26/24 1027   05/27/24 0500  Renal function panel  Tomorrow morning,   R        05/26/24 1027   05/26/24 1028  CBC  Once,   R        05/26/24 1027   05/26/24 1028  Renal function panel  Once,   R        05/26/24 1027             LOS:  LOS: 4 days   Time Spent: 35 minutes  Riley Wynder Al-Sultani, MD [facility]  If 7PM-7AM, please contact night-coverage  05/26/2024, 10:26 AM      "

## 2024-05-26 NOTE — Progress Notes (Deleted)
 Pt accepted at [facility] on TTS@10 :30am. Pt can start 05/27/2024 and will need to arrive at 10:00am for new pt paperwork.                     SABRA Suzen Satchel Dialysis Navigator [phone]

## 2024-05-26 NOTE — Progress Notes (Signed)
 " [facility]  ROUNDING NOTE   Subjective:   Riley Martin is a 79 year old male with past medical conditions including COPD, BPH, atrial fibrillation on Eliquis , IBS-C, pacemaker, anemia, and chronic kidney disease stage IV.  Patient presents to the emergency department due to abnormal labs per nephrology and has been admitted under observation for AKI (acute kidney injury) [N17.9] Symptomatic anemia [D64.9] Anemia due to chronic kidney disease, unspecified CKD stage [N18.9, D63.1]  Patient is known to our practice and receives outpatient follow-up with Dr. Korrapati.    Update Patient seen resting in bed Alert No complaints of pain Room air   Objective:  Vital signs in last 24 hours:  Temp:  [97.7 F (36.5 C)-98 F (36.7 C)] 97.7 F (36.5 C) (01/19 1354) Pulse Rate:  [63-72] 72 (01/19 1500) Resp:  [11-18] 11 (01/19 1500) BP: (112-133)/(42-68) 112/62 (01/19 1500) SpO2:  [99 %-100 %] 99 % (01/19 1500) Weight:  [58.9 kg] 58.9 kg (01/19 1354)  Weight change:  Filed Weights   05/24/24 1626 05/25/24 0559 05/26/24 1354  Weight: 54.8 kg 56.1 kg 58.9 kg    Intake/Output: I/O last 3 completed shifts: In: 360.3 [P.O.:360; I.V.:0.3] Out: -    Intake/Output this shift:  Total I/O In: 240 [P.O.:240] Out: -   Physical Exam: General: NAD  Head: Normocephalic, atraumatic. Moist oral mucosal membranes  Eyes: Anicteric  Lungs:  Clear to auscultation, normal effort  Heart: Regular rate and rhythm  Abdomen:  Soft, nontender  Extremities:  No peripheral edema.  Neurologic: Awake, alert, conversant  Skin: Warm,dry, no rash  Access: None    Basic Metabolic Panel: Recent Labs  Lab 05/22/24 0321 05/23/24 0717 05/24/24 0700 05/25/24 0513 05/26/24 1400  NA 136 136 136 134* 136  K 5.5* 5.5* 5.2* 5.0 4.8  CL 102 101 99 97* 99  CO2 25 27 29 29 28   GLUCOSE 104* 91 95 97 91  BUN 56* 49* 33* 22 34*  CREATININE 5.01* 4.81* 3.82* 2.84* 3.68*  CALCIUM  8.4* 9.2  9.2 9.3 8.5*  PHOS  --  4.7* 4.2 4.0 4.2    Liver Function Tests: Recent Labs  Lab 05/21/24 1456 05/23/24 0717 05/24/24 0700 05/25/24 0513 05/26/24 1400  AST 14*  --   --   --   --   ALT 9  --   --   --   --   ALKPHOS 63  --   --   --   --   BILITOT 0.2  --   --   --   --   PROT 6.7  --   --   --   --   ALBUMIN  4.1 4.0 4.0 4.0 3.9   No results for input(s): LIPASE, AMYLASE in the last 168 hours. No results for input(s): AMMONIA in the last 168 hours.  CBC: Recent Labs  Lab 05/22/24 0321 05/23/24 0717 05/24/24 0700 05/25/24 0513 05/26/24 1400  WBC 5.7 5.8 6.8 6.8 5.6  HGB 7.5* 8.0* 8.5* 8.4* 8.4*  HCT 23.8* 25.1* 26.0* 26.2* 25.4*  MCV 106.7* 103.7* 103.2* 104.4* 102.4*  PLT 159 176 182 184 164    Cardiac Enzymes: No results for input(s): CKTOTAL, CKMB, CKMBINDEX, TROPONINI in the last 168 hours.  BNP: Invalid input(s): POCBNP  CBG: No results for input(s): GLUCAP in the last 168 hours.  Microbiology: Results for orders placed or performed during the hospital encounter of 02/21/24  Resp panel by RT-PCR (RSV, Flu A&B, Covid) Anterior Nasal Swab  Status: None   Collection Time: 02/22/24 12:32 AM   Specimen: Anterior Nasal Swab  Result Value Ref Range Status   SARS Coronavirus 2 by RT PCR NEGATIVE NEGATIVE Final    Comment: (NOTE) SARS-CoV-2 target nucleic acids are NOT DETECTED.  The SARS-CoV-2 RNA is generally detectable in upper respiratory specimens during the acute phase of infection. The lowest concentration of SARS-CoV-2 viral copies this assay can detect is 138 copies/mL. A negative result does not preclude SARS-Cov-2 infection and should not be used as the sole basis for treatment or other patient management decisions. A negative result may occur with  improper specimen collection/handling, submission of specimen other than nasopharyngeal swab, presence of viral mutation(s) within the areas targeted by this assay, and  inadequate number of viral copies(<138 copies/mL). A negative result must be combined with clinical observations, patient history, and epidemiological information. The expected result is Negative.  Fact Sheet for Patients:  bloggercourse.com  Fact Sheet for Healthcare Providers:  seriousbroker.it  This test is no t yet approved or cleared by the [place]  FDA and  has been authorized for detection and/or diagnosis of SARS-CoV-2 by FDA under an Emergency Use Authorization (EUA). This EUA will remain  in effect (meaning this test can be used) for the duration of the COVID-19 declaration under Section 564(b)(1) of the Act, 21 U.S.C.section 360bbb-3(b)(1), unless the authorization is terminated  or revoked sooner.       Influenza A by PCR NEGATIVE NEGATIVE Final   Influenza B by PCR NEGATIVE NEGATIVE Final    Comment: (NOTE) The Xpert Xpress SARS-CoV-2/FLU/RSV plus assay is intended as an aid in the diagnosis of influenza from Nasopharyngeal swab specimens and should not be used as a sole basis for treatment. Nasal washings and aspirates are unacceptable for Xpert Xpress SARS-CoV-2/FLU/RSV testing.  Fact Sheet for Patients: bloggercourse.com  Fact Sheet for Healthcare Providers: seriousbroker.it  This test is not yet approved or cleared by the [place]  FDA and has been authorized for detection and/or diagnosis of SARS-CoV-2 by FDA under an Emergency Use Authorization (EUA). This EUA will remain in effect (meaning this test can be used) for the duration of the COVID-19 declaration under Section 564(b)(1) of the Act, 21 U.S.C. section 360bbb-3(b)(1), unless the authorization is terminated or revoked.     Resp Syncytial Virus by PCR NEGATIVE NEGATIVE Final    Comment: (NOTE) Fact Sheet for Patients: bloggercourse.com  Fact Sheet for Healthcare  Providers: seriousbroker.it  This test is not yet approved or cleared by the [place]  FDA and has been authorized for detection and/or diagnosis of SARS-CoV-2 by FDA under an Emergency Use Authorization (EUA). This EUA will remain in effect (meaning this test can be used) for the duration of the COVID-19 declaration under Section 564(b)(1) of the Act, 21 U.S.C. section 360bbb-3(b)(1), unless the authorization is terminated or revoked.  Performed at [facility], [address] [place], [place] [address]     Coagulation Studies: No results for input(s): LABPROT, INR in the last 72 hours.  Urinalysis: No results for input(s): COLORURINE, LABSPEC, PHURINE, GLUCOSEU, HGBUR, BILIRUBINUR, KETONESUR, PROTEINUR, UROBILINOGEN, NITRITE, LEUKOCYTESUR in the last 72 hours.  Invalid input(s): APPERANCEUR     Imaging: No results found.    Medications:    promethazine  (PHENERGAN ) injection (IM or IVPB) 12.5 mg (05/26/24 0501)    amiodarone   200 mg Oral Daily   Chlorhexidine  Gluconate Cloth  6 each Topical Q0600   escitalopram   5 mg Oral Daily   feeding  supplement  237 mL Oral BID BM   fluticasone  furoate-vilanterol  1 puff Inhalation Daily   heparin   5,000 Units Subcutaneous Q8H   lactulose   20 g Oral BID   oxyCODONE   10 mg Oral TID   polyethylene glycol  17 g Oral Daily   [START ON 05/28/2024] polyethylene glycol-electrolytes  4,000 mL Oral Once   senna  2 tablet Oral QHS   tamsulosin   0.4 mg Oral Daily   acetaminophen  **OR** acetaminophen , ipratropium-albuterol , morphine  injection, ondansetron  **OR** ondansetron  (ZOFRAN ) IV, oxyCODONE -acetaminophen  **AND** oxyCODONE , prochlorperazine , promethazine  (PHENERGAN ) injection (IM or IVPB), traZODone   Assessment/ Plan:  Mr. Riley Martin is a 79 y.o.  male with past medical conditions including COPD, BPH, atrial fibrillation on Eliquis , IBS-C, pacemaker, anemia,  and chronic kidney disease stage IV.  Patient presents to the emergency department due to abnormal labs per nephrology and has been admitted under observation for AKI (acute kidney injury) [N17.9] Symptomatic anemia [D64.9] Anemia due to chronic kidney disease, unspecified CKD stage [N18.9, D63.1]   Acute Kidney Injury with hyperkalemia on chronic kidney disease stage IV with baseline creatinine 4.4 and GFR of 130 on 04/25/24.  Acute kidney injury vs progression of kidney disease.  CT abdomen pelvis shows nonobstructive left renal calculi.  Potassium 5.5, given Veltassa  25.2 g.  Discussed with patient and family that we feel this is a progression of kidney disease that has led to requiring renal replacement therapy.  Family is agreeable to proceed.    Scheduled for third treatment today. UF 1-2L as tolerated. Next treatment scheduled for Wednesday. Seeking outpatient dialysis clinic.    Lab Results  Component Value Date   CREATININE 3.68 (H) 05/26/2024   CREATININE 2.84 (H) 05/25/2024   CREATININE 3.82 (H) 05/24/2024    Intake/Output Summary (Last 24 hours) at 05/26/2024 1515 Last data filed at 05/26/2024 0900 Gross per 24 hour  Intake 240 ml  Output --  Net 240 ml   2. Anemia of chronic kidney disease Lab Results  Component Value Date   HGB 8.4 (L) 05/26/2024    Will order low dose Retacrit  with dialysis.   3. Secondary Hyperparathyroidism: with outpatient labs: PTH 66 on 03/04/2024, phosphorus 5.1, calcium  8.6 on 05/19/24.   Lab Results  Component Value Date   CALCIUM  8.5 (L) 05/26/2024   PHOS 4.2 05/26/2024    Bone minerals within optimal range.    LOS: 4 Rosea Dory 1/19/20263:15 PM   "

## 2024-05-26 NOTE — Plan of Care (Signed)
 SABRA

## 2024-05-27 DIAGNOSIS — D649 Anemia, unspecified: Secondary | ICD-10-CM | POA: Diagnosis not present

## 2024-05-27 LAB — RENAL FUNCTION PANEL
Albumin: 3.9 g/dL (ref 3.5–5.0)
Anion gap: 8 (ref 5–15)
BUN: 23 mg/dL (ref 8–23)
CO2: 29 mmol/L (ref 22–32)
Calcium: 8.6 mg/dL — ABNORMAL LOW (ref 8.9–10.3)
Chloride: 94 mmol/L — ABNORMAL LOW (ref 98–111)
Creatinine, Ser: 3.15 mg/dL — ABNORMAL HIGH (ref 0.61–1.24)
GFR, Estimated: 19 mL/min — ABNORMAL LOW
Glucose, Bld: 86 mg/dL (ref 70–99)
Phosphorus: 3.9 mg/dL (ref 2.5–4.6)
Potassium: 4.4 mmol/L (ref 3.5–5.1)
Sodium: 131 mmol/L — ABNORMAL LOW (ref 135–145)

## 2024-05-27 LAB — CBC
HCT: 25.8 % — ABNORMAL LOW (ref 39.0–52.0)
Hemoglobin: 8.4 g/dL — ABNORMAL LOW (ref 13.0–17.0)
MCH: 33.5 pg (ref 26.0–34.0)
MCHC: 32.6 g/dL (ref 30.0–36.0)
MCV: 102.8 fL — ABNORMAL HIGH (ref 80.0–100.0)
Platelets: 171 K/uL (ref 150–400)
RBC: 2.51 MIL/uL — ABNORMAL LOW (ref 4.22–5.81)
RDW: 12.1 % (ref 11.5–15.5)
WBC: 5.9 K/uL (ref 4.0–10.5)
nRBC: 0 % (ref 0.0–0.2)

## 2024-05-27 MED ORDER — PEG 3350-KCL-NA BICARB-NACL 420 G PO SOLR
4000.0000 mL | Freq: Once | ORAL | Status: AC
  Administered 2024-05-27: 4000 mL via ORAL
  Filled 2024-05-27: qty 4000

## 2024-05-27 MED ORDER — SODIUM CHLORIDE 0.9 % IV SOLN: INTRAVENOUS | Status: DC

## 2024-05-27 NOTE — Progress Notes (Signed)
" ° ° °  PROCEDURAL EXPEDITER PROGRESS NOTE  Patient Name: Riley Martin  DOB:1946/01/16 Date of Admission: 05/21/2024  Date of Assessment:05/27/24   -------------------------------------------------------------------------------------------------------------------   Brief clinical summary: pt is an 79Yr old male going for a colonscopu and EGD on 05/28/24 for Anemia   Orders in place:  Yes   Communication with surgical team if no orders: n/a  Labs, test, and orders reviewed: yes  Requires surgical clearance:  No  What type of clearance: n/a  Clearance received: n/a  Barriers noted:n/a   Intervention provided by [facility] team: n/a  Barrier resolved:  not applicable   -------------------------------------------------------------------------------------------------------------------  [employer], Ronal DELENA Bald Please contact us  directly via secure chat (search for [facility]) or by calling us  at [phone] [facility]).  "

## 2024-05-27 NOTE — Progress Notes (Signed)
 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  Outpatient labs on 05/19/2024 showed a hemoglobin of 7.7 (down from baseline of 8.7) and creatinine of 5 (up from 4.4 on 12/19).  Notably, the patient had been complaining of several weeks of intractable nausea and vomiting, decreased p.o. intake, worsening shortness of breath and DOE, with dark stools for the last 10 days and an episode of bright red blood while straining.   In the ED, he was afebrile with temp of 99.1 F, HR 65, RR 18, BP 117/53, SpO2 100% on 3 L nasal cannula.  CBC showed Hgb 7.8.  CMP notable for potassium 5.5, creatinine 5.12.   CT abdomen pelvis showed large amount of retained stool throughout the colon consistent with constipation but no bowel obstruction or ileus and a nonobstructing 5 mm left renal calculus. The patient was given an NS bolus and IV Reglan  for nausea.  Admitted for further management.  Assessment and Plan:  AKI on CKD Stage IV - Follows with Dr. Dominica in nephrology clinic. Was sent to the hospital due to concern for AKI on CKD stage IV and possible initiation of RRT in the setting of several weeks of N/V and decreased PO intake - Cr 5.12 on admission - Nephrology following - Underwent placement of RIJ tunneled HD catheter placement - Underwent second HD on 1/19, had to be cut short due to patient not feeling well - Cr improving to 3.15 - Plan for HD tomorrow per nephrology  Macrocytic anemia, chronic Anemia of CKD Concern for melena -  Patient presenting with generalized weakness, SOB, DOE, reports of dark stools for 10 days - Possibly progression of anemia of CKD vs GI blood loss - Last EGD (10/2023) showed gastritis - Last colonoscopy (05/2021) showed a single nonbleeding colonic angiectasia treated with APC - FOBT ordered in ED, never collected - Anemia panel c/w anemia of CKD.  - Unclear if patient's stool are truly melanotic. Patient instructed to inform RN staff of BM in order to confirm stool, had none today.  - GI following, tentatively planned for EGD and colonoscopy on Monday 1/19 - Hgb stable  Hyperkalemia - resolved - In the setting of AKI on CKD stage IV - Correcting with HD  Nausea - Currently nauseous, but no vomiting - Has longstanding history of nausea with mostly negative workup previously including normal GES and normal EGD. Query where worsened by uremia given worsening renal function - PRN Zofran , PRN Phenergan , PRN compazine  - PO intake is poor, will consult RD  Chronic hypoxic respiratory failure COPD - On 3L home O2 - Stable - Continue home inhalers formulary equivalent  Paroxysmal Afib - In NSR - Continue home amiodarone  200 mg daily - Eliquis  held for EGD/colonoscopy on 1/21  IBS-C - CT abd/pelvis showed large amount of retained stool throughout colon consistent with constipation - Received lactulose  30 g x 2 on 1/15 with multiple bowel movements - Miralax  daily, senna 2 tabs nightly - Will stop lactulose  20 g BID since he will  complete colonoscopy prep today  Spinal stenosis Chronic pain - On Oxycontin  10 mg TID and Percocet 10-325 q4h PRN - PDMP reviewed - Continue home pain regimen  BPH - Continue home flomax  0.4 mg daily  Scheduled Meds:  amiodarone   200 mg Oral Daily   Chlorhexidine  Gluconate Cloth  6 each Topical Q0600   epoetin  alfa-epbx (RETACRIT ) injection  4,000 Units Intravenous Q M,W,F-1800   escitalopram   5 mg Oral Daily   feeding supplement  237 mL Oral BID  BM   fluticasone  furoate-vilanterol  1 puff Inhalation Daily   heparin   5,000 Units Subcutaneous Q8H   lactulose   20 g Oral BID   oxyCODONE   10 mg Oral TID   polyethylene glycol  17 g Oral Daily   polyethylene glycol-electrolytes  4,000 mL Oral Once   senna  2 tablet Oral QHS   tamsulosin   0.4 mg Oral Daily   Continuous Infusions:  sodium chloride      promethazine  (PHENERGAN ) injection (IM or IVPB) 12.5 mg (05/26/24 0501)   PRN Meds:.acetaminophen  **OR** acetaminophen , ipratropium-albuterol , morphine  injection, ondansetron  **OR** ondansetron  (ZOFRAN ) IV, oxyCODONE -acetaminophen  **AND** oxyCODONE , prochlorperazine , promethazine  (PHENERGAN ) injection (IM or IVPB), traZODone   Current Outpatient Medications  Medication Instructions   albuterol  (PROVENTIL ) (2.5 MG/3ML) 0.083% nebulizer solution 3 mLs, Nebulization, Every 6 hours PRN   amiodarone  (PACERONE ) 200 mg, Oral, Daily, Reduced from twice daily.  Home med.   apixaban  (ELIQUIS ) 2.5 MG TABS tablet Take by mouth.   budesonide  (PULMICORT ) 1 mg, Daily   Cyanocobalamin  (B-12) 2500 MCG SUBL 1 tablet, Sublingual, Daily   dicyclomine  (BENTYL ) 10 mg, Oral, 3 times daily before meals   erythromycin ophthalmic ointment 1 Application, Both Eyes, Daily at bedtime   escitalopram  (LEXAPRO ) 5 mg, Oral, Daily   fluticasone  (FLONASE ) 50 MCG/ACT nasal spray SPRAY 2 SPRAYS INTO EACH NOSTRIL EVERY DAY   [Paused] furosemide  (LASIX ) 20 mg, Oral, 2 times daily, Resume it after checking kidney function and after discussing with primary care doctor   hydrocortisone  2.5 % cream Apply topically.   ipratropium (ATROVENT ) 0.5 mg, 2 times daily   ipratropium-albuterol  (DUONEB) 0.5-2.5 (3) MG/3ML SOLN 3 mLs, Inhalation, Every 6 hours PRN   mirtazapine  (REMERON ) 15 mg, Daily at bedtime   mometasone -formoterol  (DULERA ) 200-5 MCG/ACT AERO 2 puffs, Inhalation, 2 times daily   Multiple Vitamin (MULTIVITAMIN WITH MINERALS) TABS tablet 1 tablet, Oral, Daily   naloxone   (NARCAN ) 0.4 mg,  Once   Nutritional Supplements (FEEDING SUPPLEMENT, NEPRO CARB STEADY,) LIQD 237 mLs, Oral, 3 times daily between meals   omeprazole (PRILOSEC) 40 mg, Oral, 2 times daily   ondansetron  (ZOFRAN -ODT) 4 mg, Oral, Every 8 hours PRN   oxyCODONE -acetaminophen  (PERCOCET) 10-325 MG tablet 1 tablet, 5 times daily PRN   OxyCONTIN  10 mg, 3 times daily   OXYGEN 2 L, Inhalation, At bedtime PRN   sodium chloride  (OCEAN) 0.65 % SOLN nasal spray 1 spray, Each Nare, As needed   [Paused] spironolactone (ALDACTONE) 25 mg, Daily   tamsulosin  (FLOMAX ) 0.4 mg, Daily   traZODone  (DESYREL ) 25 mg, At bedtime PRN   VENTOLIN  HFA 108 (90 Base) MCG/ACT inhaler 2 puffs, Every 6 hours PRN    DVT prophylaxis: heparin  injection 5,000 Units Start: 05/25/24 1730Eliquis held for colonoscopy/EGD on 1/21. heparin  injection 5,000 Units Start: 05/25/24 1730     Code Status:   Code Status: Full Code  Family Communication: Discussed with wife at bedside  Disposition Plan: Nephrology planning for sequential HD sessions, then likely home after outpatient HD chair  arranged PT -   -   OT -   -    Level of care: Telemetry  Consultants:  Nephrology, GI, vascular surgery  Procedures:  None  Antimicrobials: None   Subjective: Patient examined at bedside. Doing okay today, appears comfortable. States HD yesterday was rough. Was able to eat some tomato soup but not much. No appetite still.   Objective: Vitals:   05/27/24 0420 05/27/24 0857 05/27/24 1155 05/27/24 1525  BP: (!) 109/53 (!) 115/51 (!) 102/51 (!) 103/48  Pulse: 65 78 73 68  Resp: 18 19  16   Temp: 97.8 F (36.6 C) 98.5 F (36.9 C) 98.1 F (36.7 C) 98.4 F (36.9 C)  TempSrc: Oral Oral Oral   SpO2: 99% 100% 98% 100%  Weight:      Height:        Intake/Output Summary (Last 24 hours) at 05/27/2024 1539 Last data filed at 05/27/2024 1300 Gross per 24 hour  Intake 1200 ml  Output 1200 ml  Net 0 ml   Filed Weights   05/24/24 1626  05/25/24 0559 05/26/24 1354  Weight: 54.8 kg 56.1 kg 58.9 kg    Examination:  Gen: NAD, A&Ox3, fatigued  HEENT: NCAT Neck: Supple, RIJ tunneled HD catheter in place CV: RRR, no murmurs Resp: normal WOB, CTAB, no w/r/r Abd: Soft, NTND, no guarding Ext: trace pedal edema bilaterally, pulses 2+ b/l Skin: Warm, dry, no rashes/lesions Neuro: no focal deficits Psych: Calm, cooperative, appropriate affect   Data Reviewed: I have personally reviewed following labs and imaging studies  CBC: Recent Labs  Lab 05/23/24 0717 05/24/24 0700 05/25/24 0513 05/26/24 1400 05/27/24 0450  WBC 5.8 6.8 6.8 5.6 5.9  HGB 8.0* 8.5* 8.4* 8.4* 8.4*  HCT 25.1* 26.0* 26.2* 25.4* 25.8*  MCV 103.7* 103.2* 104.4* 102.4* 102.8*  PLT 176 182 184 164 171   Basic Metabolic Panel: Recent Labs  Lab 05/23/24 0717 05/24/24 0700 05/25/24 0513 05/26/24 1400 05/27/24 0450  NA 136 136 134* 136 131*  K 5.5* 5.2* 5.0 4.8 4.4  CL 101 99 97* 99 94*  CO2 27 29 29 28 29   GLUCOSE 91 95 97 91 86  BUN 49* 33* 22 34* 23  CREATININE 4.81* 3.82* 2.84* 3.68* 3.15*  CALCIUM  9.2 9.2 9.3 8.5* 8.6*  PHOS 4.7* 4.2 4.0 4.2 3.9   GFR: Estimated Creatinine Clearance: 15.6 mL/min (A) (by C-G formula based on SCr of 3.15 mg/dL (H)). Liver Function Tests: Recent Labs  Lab 05/21/24 1456 05/23/24 0717 05/24/24 0700 05/25/24 0513 05/26/24 1400 05/27/24 0450  AST 14*  --   --   --   --   --   ALT 9  --   --   --   --   --   ALKPHOS 63  --   --   --   --   --   BILITOT 0.2  --   --   --   --   --   PROT 6.7  --   --   --   --   --   ALBUMIN  4.1 4.0 4.0 4.0 3.9 3.9   No results for input(s): LIPASE, AMYLASE in the last 168 hours. No results for input(s): AMMONIA in the last 168 hours. Coagulation Profile: No results for input(s): INR, PROTIME in the last 168 hours. Cardiac Enzymes: No results for input(s): CKTOTAL, CKMB, CKMBINDEX, TROPONINI in the last 168 hours. BNP (last 3 results) Recent Labs     04/09/24 1059  PROBNP  656.0*   HbA1C: No results for input(s): HGBA1C in the last 72 hours. CBG: No results for input(s): GLUCAP in the last 168 hours. Lipid Profile: No results for input(s): CHOL, HDL, LDLCALC, TRIG, CHOLHDL, LDLDIRECT in the last 72 hours. Thyroid  Function Tests: No results for input(s): TSH, T4TOTAL, FREET4, T3FREE, THYROIDAB in the last 72 hours. Anemia Panel: No results for input(s): VITAMINB12, FOLATE, FERRITIN, TIBC, IRON , RETICCTPCT in the last 72 hours.  Sepsis Labs: No results for input(s): PROCALCITON, LATICACIDVEN in the last 168 hours.  No results found for this or any previous visit (from the past 240 hours).   Radiology Studies: No results found.   Scheduled Meds:  amiodarone   200 mg Oral Daily   Chlorhexidine  Gluconate Cloth  6 each Topical Q0600   epoetin  alfa-epbx (RETACRIT ) injection  4,000 Units Intravenous Q M,W,F-1800   escitalopram   5 mg Oral Daily   feeding supplement  237 mL Oral BID BM   fluticasone  furoate-vilanterol  1 puff Inhalation Daily   heparin   5,000 Units Subcutaneous Q8H   lactulose   20 g Oral BID   oxyCODONE   10 mg Oral TID   polyethylene glycol  17 g Oral Daily   polyethylene glycol-electrolytes  4,000 mL Oral Once   senna  2 tablet Oral QHS   tamsulosin   0.4 mg Oral Daily   Continuous Infusions:  sodium chloride      promethazine  (PHENERGAN ) injection (IM or IVPB) 12.5 mg (05/26/24 0501)     Unresulted Labs (From admission, onward)     Start     Ordered   05/28/24 0500  CBC  Tomorrow morning,   R        05/27/24 1550   05/28/24 0500  Basic metabolic panel with GFR  Tomorrow morning,   R        05/27/24 1550             LOS:  LOS: 5 days   Time Spent: 35 minutes  Verlon Carcione Al-Sultani, MD [facility]  If 7PM-7AM, please contact night-coverage  05/27/2024, 3:39 PM      "

## 2024-05-27 NOTE — Care Management Important Message (Signed)
 Important Message  Patient Details  Name: Riley Martin MRN: [id number] Date of Birth: 1946/04/07   Important Message Given:  Yes - Medicare IM     Jovan Schickling W, CMA 05/27/2024, 11:50 AM

## 2024-05-27 NOTE — Plan of Care (Signed)
 Riley Martin

## 2024-05-27 NOTE — Progress Notes (Signed)
 " [facility]  ROUNDING NOTE   Subjective:   Riley Martin is a 79 year old male with past medical conditions including COPD, BPH, atrial fibrillation on Eliquis , IBS-C, pacemaker, anemia, and chronic kidney disease stage IV.  Patient presents to the emergency department due to abnormal labs per nephrology and has been admitted under observation for AKI (acute kidney injury) [N17.9] Symptomatic anemia [D64.9] Anemia due to chronic kidney disease, unspecified CKD stage [N18.9, D63.1]  Patient is known to our practice and receives outpatient follow-up with Dr. Korrapati.    Update Patient resting quietly No family present Easily aroused Complained of abd pain during the night   Objective:  Vital signs in last 24 hours:  Temp:  [97.5 F (36.4 C)-98.5 F (36.9 C)] 98.5 F (36.9 C) (01/20 0857) Pulse Rate:  [52-78] 78 (01/20 0857) Resp:  [11-19] 19 (01/20 0857) BP: (88-133)/(41-68) 115/51 (01/20 0857) SpO2:  [85 %-100 %] 100 % (01/20 0857) Weight:  [58.9 kg] 58.9 kg (01/19 1354)  Weight change:  Filed Weights   05/24/24 1626 05/25/24 0559 05/26/24 1354  Weight: 54.8 kg 56.1 kg 58.9 kg    Intake/Output: I/O last 3 completed shifts: In: 600 [P.O.:600] Out: 1200 [Other:1200]   Intake/Output this shift:  Total I/O In: 360 [P.O.:360] Out: -   Physical Exam: General: NAD  Head: Normocephalic, atraumatic. Moist oral mucosal membranes  Eyes: Anicteric  Lungs:  Clear to auscultation, normal effort  Heart: Regular rate and rhythm  Abdomen:  Soft, nontender  Extremities:  No peripheral edema.  Neurologic: Awake, alert, conversant  Skin: Warm,dry, no rash  Access: None    Basic Metabolic Panel: Recent Labs  Lab 05/23/24 0717 05/24/24 0700 05/25/24 0513 05/26/24 1400 05/27/24 0450  NA 136 136 134* 136 131*  K 5.5* 5.2* 5.0 4.8 4.4  CL 101 99 97* 99 94*  CO2 27 29 29 28 29   GLUCOSE 91 95 97 91 86  BUN 49* 33* 22 34* 23  CREATININE 4.81* 3.82*  2.84* 3.68* 3.15*  CALCIUM  9.2 9.2 9.3 8.5* 8.6*  PHOS 4.7* 4.2 4.0 4.2 3.9    Liver Function Tests: Recent Labs  Lab 05/21/24 1456 05/23/24 0717 05/24/24 0700 05/25/24 0513 05/26/24 1400 05/27/24 0450  AST 14*  --   --   --   --   --   ALT 9  --   --   --   --   --   ALKPHOS 63  --   --   --   --   --   BILITOT 0.2  --   --   --   --   --   PROT 6.7  --   --   --   --   --   ALBUMIN  4.1 4.0 4.0 4.0 3.9 3.9   No results for input(s): LIPASE, AMYLASE in the last 168 hours. No results for input(s): AMMONIA in the last 168 hours.  CBC: Recent Labs  Lab 05/23/24 0717 05/24/24 0700 05/25/24 0513 05/26/24 1400 05/27/24 0450  WBC 5.8 6.8 6.8 5.6 5.9  HGB 8.0* 8.5* 8.4* 8.4* 8.4*  HCT 25.1* 26.0* 26.2* 25.4* 25.8*  MCV 103.7* 103.2* 104.4* 102.4* 102.8*  PLT 176 182 184 164 171    Cardiac Enzymes: No results for input(s): CKTOTAL, CKMB, CKMBINDEX, TROPONINI in the last 168 hours.  BNP: Invalid input(s): POCBNP  CBG: No results for input(s): GLUCAP in the last 168 hours.  Microbiology: Results for orders placed or performed during the  hospital encounter of 02/21/24  Resp panel by RT-PCR (RSV, Flu A&B, Covid) Anterior Nasal Swab     Status: None   Collection Time: 02/22/24 12:32 AM   Specimen: Anterior Nasal Swab  Result Value Ref Range Status   SARS Coronavirus 2 by RT PCR NEGATIVE NEGATIVE Final    Comment: (NOTE) SARS-CoV-2 target nucleic acids are NOT DETECTED.  The SARS-CoV-2 RNA is generally detectable in upper respiratory specimens during the acute phase of infection. The lowest concentration of SARS-CoV-2 viral copies this assay can detect is 138 copies/mL. A negative result does not preclude SARS-Cov-2 infection and should not be used as the sole basis for treatment or other patient management decisions. A negative result may occur with  improper specimen collection/handling, submission of specimen other than nasopharyngeal swab,  presence of viral mutation(s) within the areas targeted by this assay, and inadequate number of viral copies(<138 copies/mL). A negative result must be combined with clinical observations, patient history, and epidemiological information. The expected result is Negative.  Fact Sheet for Patients:  bloggercourse.com  Fact Sheet for Healthcare Providers:  seriousbroker.it  This test is no t yet approved or cleared by the [place]  FDA and  has been authorized for detection and/or diagnosis of SARS-CoV-2 by FDA under an Emergency Use Authorization (EUA). This EUA will remain  in effect (meaning this test can be used) for the duration of the COVID-19 declaration under Section 564(b)(1) of the Act, 21 U.S.C.section 360bbb-3(b)(1), unless the authorization is terminated  or revoked sooner.       Influenza A by PCR NEGATIVE NEGATIVE Final   Influenza B by PCR NEGATIVE NEGATIVE Final    Comment: (NOTE) The Xpert Xpress SARS-CoV-2/FLU/RSV plus assay is intended as an aid in the diagnosis of influenza from Nasopharyngeal swab specimens and should not be used as a sole basis for treatment. Nasal washings and aspirates are unacceptable for Xpert Xpress SARS-CoV-2/FLU/RSV testing.  Fact Sheet for Patients: bloggercourse.com  Fact Sheet for Healthcare Providers: seriousbroker.it  This test is not yet approved or cleared by the [place]  FDA and has been authorized for detection and/or diagnosis of SARS-CoV-2 by FDA under an Emergency Use Authorization (EUA). This EUA will remain in effect (meaning this test can be used) for the duration of the COVID-19 declaration under Section 564(b)(1) of the Act, 21 U.S.C. section 360bbb-3(b)(1), unless the authorization is terminated or revoked.     Resp Syncytial Virus by PCR NEGATIVE NEGATIVE Final    Comment: (NOTE) Fact Sheet for  Patients: bloggercourse.com  Fact Sheet for Healthcare Providers: seriousbroker.it  This test is not yet approved or cleared by the [place]  FDA and has been authorized for detection and/or diagnosis of SARS-CoV-2 by FDA under an Emergency Use Authorization (EUA). This EUA will remain in effect (meaning this test can be used) for the duration of the COVID-19 declaration under Section 564(b)(1) of the Act, 21 U.S.C. section 360bbb-3(b)(1), unless the authorization is terminated or revoked.  Performed at [facility], [address] [place], [place] [address]     Coagulation Studies: No results for input(s): LABPROT, INR in the last 72 hours.  Urinalysis: No results for input(s): COLORURINE, LABSPEC, PHURINE, GLUCOSEU, HGBUR, BILIRUBINUR, KETONESUR, PROTEINUR, UROBILINOGEN, NITRITE, LEUKOCYTESUR in the last 72 hours.  Invalid input(s): APPERANCEUR     Imaging: No results found.    Medications:    promethazine  (PHENERGAN ) injection (IM or IVPB) 12.5 mg (05/26/24 0501)    amiodarone   200 mg Oral Daily  Chlorhexidine  Gluconate Cloth  6 each Topical Q0600   epoetin  alfa-epbx (RETACRIT ) injection  4,000 Units Intravenous Q M,W,F-1800   escitalopram   5 mg Oral Daily   feeding supplement  237 mL Oral BID BM   fluticasone  furoate-vilanterol  1 puff Inhalation Daily   heparin   5,000 Units Subcutaneous Q8H   lactulose   20 g Oral BID   oxyCODONE   10 mg Oral TID   polyethylene glycol  17 g Oral Daily   [START ON 05/28/2024] polyethylene glycol-electrolytes  4,000 mL Oral Once   senna  2 tablet Oral QHS   tamsulosin   0.4 mg Oral Daily   acetaminophen  **OR** acetaminophen , ipratropium-albuterol , morphine  injection, ondansetron  **OR** ondansetron  (ZOFRAN ) IV, oxyCODONE -acetaminophen  **AND** oxyCODONE , prochlorperazine , promethazine  (PHENERGAN ) injection (IM or IVPB),  traZODone   Assessment/ Plan:  Mr. Riley Martin is a 79 y.o.  male with past medical conditions including COPD, BPH, atrial fibrillation on Eliquis , IBS-C, pacemaker, anemia, and chronic kidney disease stage IV.  Patient presents to the emergency department due to abnormal labs per nephrology and has been admitted under observation for AKI (acute kidney injury) [N17.9] Symptomatic anemia [D64.9] Anemia due to chronic kidney disease, unspecified CKD stage [N18.9, D63.1]   Acute Kidney Injury with hyperkalemia on chronic kidney disease stage IV with baseline creatinine 4.4 and GFR of 130 on 04/25/24.  Acute kidney injury vs progression of kidney disease.  CT abdomen pelvis shows nonobstructive left renal calculi.  Potassium 5.5, given Veltassa  25.2 g.  Discussed with patient and family that we feel this is a progression of kidney disease that has led to requiring renal replacement therapy.  Family is agreeable to proceed.    Received dialysis yesterday, UF 1.2L achieved.  Next treatment scheduled for Wednesday. Seeking outpatient dialysis clinic.    Lab Results  Component Value Date   CREATININE 3.15 (H) 05/27/2024   CREATININE 3.68 (H) 05/26/2024   CREATININE 2.84 (H) 05/25/2024    Intake/Output Summary (Last 24 hours) at 05/27/2024 1100 Last data filed at 05/27/2024 0900 Gross per 24 hour  Intake 720 ml  Output 1200 ml  Net -480 ml   2. Anemia of chronic kidney disease Lab Results  Component Value Date   HGB 8.4 (L) 05/27/2024    Continue low dose Retacrit  with dialysis.   3. Secondary Hyperparathyroidism: with outpatient labs: PTH 66 on 03/04/2024, phosphorus 5.1, calcium  8.6 on 05/19/24.   Lab Results  Component Value Date   CALCIUM  8.6 (L) 05/27/2024   PHOS 3.9 05/27/2024    Calcium  and phos within optimal range   LOS: 5 Riley Martin 1/20/202611:00 AM   "

## 2024-05-27 NOTE — Progress Notes (Signed)
 "    Rogelia Copping, MD [facility]   [address], [address] [place], [place] [address] Phone: [phone] Fax : [phone]   Subjective: The patient was admitted with anemia and was tested to undergo a luminal evaluation but had been given anticoagulation.  The patient was told that we would have to wait to the anticoagulation wore off prior to proceeding with a luminal evaluation.  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   Objective: Vital signs in last 24 hours: Vitals:   05/26/24 1947 05/27/24 0027 05/27/24 0420 05/27/24 0857  BP: (!) 123/41 (!) 88/49 (!) 109/53 (!) 115/51  Pulse: (!) 56 69 65 78  Resp: 18 17 18 19   Temp: 97.6 F (36.4 C) 97.7 F (36.5 C) 97.8 F (36.6 C) 98.5 F (36.9 C)  TempSrc: Oral Oral Oral Oral  SpO2: (!) 85% 99% 99% 100%  Weight:      Height:       Weight change:   Intake/Output Summary (Last 24 hours) at 05/27/2024 1138 Last data filed at 05/27/2024 0900 Gross per 24 hour  Intake 720 ml  Output 1200 ml  Net -480 ml     Exam: Heart:: Regular rate and rhythm or without murmur or extra heart sounds Lungs: normal and clear to auscultation and percussion Abdomen: soft, nontender, normal bowel sounds   Lab Results: @LABTEST2 @ Micro Results: No results found for this or any previous visit (from the past 240 hours). Studies/Results: No results found. Medications: I have reviewed the patient's current medications. Scheduled Meds:  amiodarone   200 mg Oral Daily   Chlorhexidine  Gluconate Cloth  6 each Topical Q0600   epoetin  alfa-epbx (RETACRIT ) injection  4,000 Units Intravenous Q M,W,F-1800   escitalopram   5 mg Oral Daily   feeding supplement  237 mL Oral BID BM   fluticasone  furoate-vilanterol  1 puff Inhalation Daily   heparin   5,000 Units Subcutaneous Q8H   lactulose   20 g Oral BID   oxyCODONE   10 mg Oral TID    polyethylene glycol  17 g Oral Daily   [START ON 05/28/2024] polyethylene glycol-electrolytes  4,000 mL Oral Once   senna  2 tablet Oral QHS   tamsulosin   0.4 mg Oral Daily   Continuous Infusions:  promethazine  (PHENERGAN ) injection (IM or IVPB) 12.5 mg (05/26/24 0501)   PRN Meds:.acetaminophen  **OR** acetaminophen , ipratropium-albuterol , morphine  injection, ondansetron  **OR** ondansetron  (ZOFRAN ) IV, oxyCODONE -acetaminophen  **AND** oxyCODONE , prochlorperazine , promethazine  (PHENERGAN ) injection (IM or IVPB), traZODone    Assessment: Principal Problem:   Symptomatic anemia Active Problems:   BPH (benign prostatic hyperplasia)   History of anemia due to chronic kidney disease   Irritable bowel syndrome with constipation   Spinal stenosis   COPD (chronic obstructive pulmonary disease) (HCC)   Chronic pain   Type II diabetes mellitus with renal manifestations (HCC)   Acute renal failure superimposed on stage 4 chronic kidney disease (HCC)   Hyperkalemia   Chronic respiratory failure with hypoxia (HCC)   Nausea and vomiting   Paroxysmal atrial fibrillation (HCC)   History of cardiac pacemaker    Plan: This patient has anemia and has been off his anticoagulation in preparation for an EGD and colonoscopy.  The patient will be given an prep today and plan for an EGD and colonoscopy for tomorrow.  The patient has been explained the plan and agrees with it.   LOS: 5 days  Rogelia Copping, MD.FACG 05/27/2024, 11:38 AM Pager [phone] 7am-5pm  Check AMION for 5pm -7am coverage and on weekends  "

## 2024-05-28 ENCOUNTER — Encounter: Payer: Self-pay | Admitting: Internal Medicine

## 2024-05-28 ENCOUNTER — Encounter: Admission: EM | Disposition: A | Payer: Self-pay | Source: Home / Self Care

## 2024-05-28 ENCOUNTER — Inpatient Hospital Stay: Admitting: Certified Registered"

## 2024-05-28 DIAGNOSIS — D509 Iron deficiency anemia, unspecified: Secondary | ICD-10-CM | POA: Diagnosis not present

## 2024-05-28 DIAGNOSIS — N179 Acute kidney failure, unspecified: Secondary | ICD-10-CM | POA: Diagnosis not present

## 2024-05-28 DIAGNOSIS — K5521 Angiodysplasia of colon with hemorrhage: Secondary | ICD-10-CM

## 2024-05-28 DIAGNOSIS — D649 Anemia, unspecified: Secondary | ICD-10-CM | POA: Diagnosis not present

## 2024-05-28 DIAGNOSIS — E875 Hyperkalemia: Secondary | ICD-10-CM | POA: Diagnosis not present

## 2024-05-28 DIAGNOSIS — J9611 Chronic respiratory failure with hypoxia: Secondary | ICD-10-CM | POA: Diagnosis not present

## 2024-05-28 DIAGNOSIS — N184 Chronic kidney disease, stage 4 (severe): Secondary | ICD-10-CM | POA: Diagnosis not present

## 2024-05-28 HISTORY — PX: ESOPHAGOGASTRODUODENOSCOPY: SHX5428

## 2024-05-28 HISTORY — PX: COLONOSCOPY: SHX5424

## 2024-05-28 LAB — BASIC METABOLIC PANEL WITH GFR
Anion gap: 13 (ref 5–15)
BUN: 29 mg/dL — ABNORMAL HIGH (ref 8–23)
CO2: 25 mmol/L (ref 22–32)
Calcium: 9.2 mg/dL (ref 8.9–10.3)
Chloride: 92 mmol/L — ABNORMAL LOW (ref 98–111)
Creatinine, Ser: 3.73 mg/dL — ABNORMAL HIGH (ref 0.61–1.24)
GFR, Estimated: 16 mL/min — ABNORMAL LOW
Glucose, Bld: 87 mg/dL (ref 70–99)
Potassium: 4.4 mmol/L (ref 3.5–5.1)
Sodium: 130 mmol/L — ABNORMAL LOW (ref 135–145)

## 2024-05-28 LAB — CBC
HCT: 27.1 % — ABNORMAL LOW (ref 39.0–52.0)
Hemoglobin: 9.1 g/dL — ABNORMAL LOW (ref 13.0–17.0)
MCH: 34.1 pg — ABNORMAL HIGH (ref 26.0–34.0)
MCHC: 33.6 g/dL (ref 30.0–36.0)
MCV: 101.5 fL — ABNORMAL HIGH (ref 80.0–100.0)
Platelets: 211 K/uL (ref 150–400)
RBC: 2.67 MIL/uL — ABNORMAL LOW (ref 4.22–5.81)
RDW: 12.1 % (ref 11.5–15.5)
WBC: 8.3 K/uL (ref 4.0–10.5)
nRBC: 0 % (ref 0.0–0.2)

## 2024-05-28 LAB — OCCULT BLOOD X 1 CARD TO LAB, STOOL: Fecal Occult Bld: NEGATIVE

## 2024-05-28 SURGERY — EGD (ESOPHAGOGASTRODUODENOSCOPY)
Anesthesia: General

## 2024-05-28 MED ORDER — SODIUM CHLORIDE 0.9 % IV SOLN: INTRAVENOUS | Status: DC

## 2024-05-28 MED ORDER — RENA-VITE PO TABS
1.0000 | ORAL_TABLET | Freq: Every day | ORAL | Status: DC
  Administered 2024-05-28: 1 via ORAL
  Filled 2024-05-28: qty 1

## 2024-05-28 MED ORDER — GLYCOPYRROLATE 0.2 MG/ML IJ SOLN
INTRAMUSCULAR | Status: DC | PRN
  Administered 2024-05-28: .2 mg via INTRAVENOUS

## 2024-05-28 MED ORDER — BOOST PLUS PO LIQD
237.0000 mL | Freq: Three times a day (TID) | ORAL | Status: DC
  Administered 2024-05-29: 237 mL via ORAL

## 2024-05-28 MED ORDER — PROPOFOL 500 MG/50ML IV EMUL
INTRAVENOUS | Status: DC | PRN
  Administered 2024-05-28: 145 ug/kg/min via INTRAVENOUS

## 2024-05-28 MED ORDER — LIDOCAINE HCL (CARDIAC) PF 100 MG/5ML IV SOSY
PREFILLED_SYRINGE | INTRAVENOUS | Status: DC | PRN
  Administered 2024-05-28: 100 mg via INTRAVENOUS

## 2024-05-28 MED ORDER — PROPOFOL 10 MG/ML IV BOLUS
INTRAVENOUS | Status: DC | PRN
  Administered 2024-05-28: 60 mg via INTRAVENOUS

## 2024-05-28 MED ORDER — PHENYLEPHRINE 80 MCG/ML (10ML) SYRINGE FOR IV PUSH (FOR BLOOD PRESSURE SUPPORT)
PREFILLED_SYRINGE | INTRAVENOUS | Status: DC | PRN
  Administered 2024-05-28 (×3): 160 ug via INTRAVENOUS

## 2024-05-28 NOTE — Progress Notes (Signed)
 " [facility]  ROUNDING NOTE   Subjective:   Riley Martin is a 79 year old male with past medical conditions including COPD, BPH, atrial fibrillation on Eliquis , IBS-C, pacemaker, anemia, and chronic kidney disease stage IV.  Patient presents to the emergency department due to abnormal labs per nephrology and has been admitted under observation for AKI (acute kidney injury) [N17.9] Symptomatic anemia [D64.9] Anemia due to chronic kidney disease, unspecified CKD stage [N18.9, D63.1]  Patient is known to our practice and receives outpatient follow-up with Dr. Korrapati.    Update Patient seen resting quietly NPO for GI procedures today  Objective:  Vital signs in last 24 hours:  Temp:  [97.5 F (36.4 C)-98.9 F (37.2 C)] 97.5 F (36.4 C) (01/21 0803) Pulse Rate:  [64-73] 66 (01/21 0803) Resp:  [15-16] 15 (01/21 0013) BP: (102-129)/(38-55) 120/53 (01/21 0803) SpO2:  [96 %-100 %] 100 % (01/21 0803)  Weight change:  Filed Weights   05/24/24 1626 05/25/24 0559 05/26/24 1354  Weight: 54.8 kg 56.1 kg 58.9 kg    Intake/Output: I/O last 3 completed shifts: In: 1716.1 [P.O.:1440; I.V.:176.1; IV Piggyback:100] Out: -    Intake/Output this shift:  No intake/output data recorded.  Physical Exam: General: NAD  Head: Normocephalic, atraumatic. Moist oral mucosal membranes  Eyes: Anicteric  Lungs:  Clear to auscultation, normal effort  Heart: Regular rate and rhythm  Abdomen:  Soft, nontender  Extremities:  No peripheral edema.  Neurologic: Awake, alert, conversant  Skin: Warm,dry, no rash  Access: Rt internal jugular permcath    Basic Metabolic Panel: Recent Labs  Lab 05/23/24 0717 05/24/24 0700 05/25/24 0513 05/26/24 1400 05/27/24 0450 05/28/24 0456  NA 136 136 134* 136 131* 130*  K 5.5* 5.2* 5.0 4.8 4.4 4.4  CL 101 99 97* 99 94* 92*  CO2 27 29 29 28 29 25   GLUCOSE 91 95 97 91 86 87  BUN 49* 33* 22 34* 23 29*  CREATININE 4.81* 3.82* 2.84* 3.68*  3.15* 3.73*  CALCIUM  9.2 9.2 9.3 8.5* 8.6* 9.2  PHOS 4.7* 4.2 4.0 4.2 3.9  --     Liver Function Tests: Recent Labs  Lab 05/21/24 1456 05/23/24 0717 05/24/24 0700 05/25/24 0513 05/26/24 1400 05/27/24 0450  AST 14*  --   --   --   --   --   ALT 9  --   --   --   --   --   ALKPHOS 63  --   --   --   --   --   BILITOT 0.2  --   --   --   --   --   PROT 6.7  --   --   --   --   --   ALBUMIN  4.1 4.0 4.0 4.0 3.9 3.9   No results for input(s): LIPASE, AMYLASE in the last 168 hours. No results for input(s): AMMONIA in the last 168 hours.  CBC: Recent Labs  Lab 05/24/24 0700 05/25/24 0513 05/26/24 1400 05/27/24 0450 05/28/24 0456  WBC 6.8 6.8 5.6 5.9 8.3  HGB 8.5* 8.4* 8.4* 8.4* 9.1*  HCT 26.0* 26.2* 25.4* 25.8* 27.1*  MCV 103.2* 104.4* 102.4* 102.8* 101.5*  PLT 182 184 164 171 211    Cardiac Enzymes: No results for input(s): CKTOTAL, CKMB, CKMBINDEX, TROPONINI in the last 168 hours.  BNP: Invalid input(s): POCBNP  CBG: No results for input(s): GLUCAP in the last 168 hours.  Microbiology: Results for orders placed or performed during the  hospital encounter of 02/21/24  Resp panel by RT-PCR (RSV, Flu A&B, Covid) Anterior Nasal Swab     Status: None   Collection Time: 02/22/24 12:32 AM   Specimen: Anterior Nasal Swab  Result Value Ref Range Status   SARS Coronavirus 2 by RT PCR NEGATIVE NEGATIVE Final    Comment: (NOTE) SARS-CoV-2 target nucleic acids are NOT DETECTED.  The SARS-CoV-2 RNA is generally detectable in upper respiratory specimens during the acute phase of infection. The lowest concentration of SARS-CoV-2 viral copies this assay can detect is 138 copies/mL. A negative result does not preclude SARS-Cov-2 infection and should not be used as the sole basis for treatment or other patient management decisions. A negative result may occur with  improper specimen collection/handling, submission of specimen other than nasopharyngeal swab,  presence of viral mutation(s) within the areas targeted by this assay, and inadequate number of viral copies(<138 copies/mL). A negative result must be combined with clinical observations, patient history, and epidemiological information. The expected result is Negative.  Fact Sheet for Patients:  bloggercourse.com  Fact Sheet for Healthcare Providers:  seriousbroker.it  This test is no t yet approved or cleared by the [place]  FDA and  has been authorized for detection and/or diagnosis of SARS-CoV-2 by FDA under an Emergency Use Authorization (EUA). This EUA will remain  in effect (meaning this test can be used) for the duration of the COVID-19 declaration under Section 564(b)(1) of the Act, 21 U.S.C.section 360bbb-3(b)(1), unless the authorization is terminated  or revoked sooner.       Influenza A by PCR NEGATIVE NEGATIVE Final   Influenza B by PCR NEGATIVE NEGATIVE Final    Comment: (NOTE) The Xpert Xpress SARS-CoV-2/FLU/RSV plus assay is intended as an aid in the diagnosis of influenza from Nasopharyngeal swab specimens and should not be used as a sole basis for treatment. Nasal washings and aspirates are unacceptable for Xpert Xpress SARS-CoV-2/FLU/RSV testing.  Fact Sheet for Patients: bloggercourse.com  Fact Sheet for Healthcare Providers: seriousbroker.it  This test is not yet approved or cleared by the [place]  FDA and has been authorized for detection and/or diagnosis of SARS-CoV-2 by FDA under an Emergency Use Authorization (EUA). This EUA will remain in effect (meaning this test can be used) for the duration of the COVID-19 declaration under Section 564(b)(1) of the Act, 21 U.S.C. section 360bbb-3(b)(1), unless the authorization is terminated or revoked.     Resp Syncytial Virus by PCR NEGATIVE NEGATIVE Final    Comment: (NOTE) Fact Sheet for  Patients: bloggercourse.com  Fact Sheet for Healthcare Providers: seriousbroker.it  This test is not yet approved or cleared by the [place]  FDA and has been authorized for detection and/or diagnosis of SARS-CoV-2 by FDA under an Emergency Use Authorization (EUA). This EUA will remain in effect (meaning this test can be used) for the duration of the COVID-19 declaration under Section 564(b)(1) of the Act, 21 U.S.C. section 360bbb-3(b)(1), unless the authorization is terminated or revoked.  Performed at [facility], [address] [place], [place] [address]     Coagulation Studies: No results for input(s): LABPROT, INR in the last 72 hours.  Urinalysis: No results for input(s): COLORURINE, LABSPEC, PHURINE, GLUCOSEU, HGBUR, BILIRUBINUR, KETONESUR, PROTEINUR, UROBILINOGEN, NITRITE, LEUKOCYTESUR in the last 72 hours.  Invalid input(s): APPERANCEUR     Imaging: No results found.    Medications:    sodium chloride  Stopped (05/28/24 0310)   promethazine  (PHENERGAN ) injection (IM or IVPB) Stopped (05/26/24 0523)    amiodarone   200 mg Oral Daily   Chlorhexidine  Gluconate Cloth  6 each Topical Q0600   epoetin  alfa-epbx (RETACRIT ) injection  4,000 Units Intravenous Q M,W,F-1800   escitalopram   5 mg Oral Daily   feeding supplement  237 mL Oral BID BM   fluticasone  furoate-vilanterol  1 puff Inhalation Daily   heparin   5,000 Units Subcutaneous Q8H   oxyCODONE   10 mg Oral TID   polyethylene glycol  17 g Oral Daily   senna  2 tablet Oral QHS   tamsulosin   0.4 mg Oral Daily   acetaminophen  **OR** acetaminophen , ipratropium-albuterol , morphine  injection, ondansetron  **OR** ondansetron  (ZOFRAN ) IV, oxyCODONE -acetaminophen  **AND** oxyCODONE , prochlorperazine , promethazine  (PHENERGAN ) injection (IM or IVPB), traZODone   Assessment/ Plan:  Mr. Riley Martin is a 79 y.o.  male with past  medical conditions including COPD, BPH, atrial fibrillation on Eliquis , IBS-C, pacemaker, anemia, and chronic kidney disease stage IV.  Patient presents to the emergency department due to abnormal labs per nephrology and has been admitted under observation for AKI (acute kidney injury) [N17.9] Symptomatic anemia [D64.9] Anemia due to chronic kidney disease, unspecified CKD stage [N18.9, D63.1]   Acute Kidney Injury with hyperkalemia on chronic kidney disease stage IV with baseline creatinine 4.4 and GFR of 130 on 04/25/24.  Acute kidney injury vs progression of kidney disease.  CT abdomen pelvis shows nonobstructive left renal calculi.  Potassium 5.5, given Veltassa  25.2 g.  Discussed with patient and family that we feel this is a progression of kidney disease that has led to requiring renal replacement therapy.  Family is agreeable to proceed.    Scheduled to receive dialysis today after procedures. Awaiting outpatient clinic acceptance.    Lab Results  Component Value Date   CREATININE 3.73 (H) 05/28/2024   CREATININE 3.15 (H) 05/27/2024   CREATININE 3.68 (H) 05/26/2024    Intake/Output Summary (Last 24 hours) at 05/28/2024 1049 Last data filed at 05/28/2024 0417 Gross per 24 hour  Intake 996.11 ml  Output --  Net 996.11 ml   2. Anemia of chronic kidney disease Lab Results  Component Value Date   HGB 9.1 (L) 05/28/2024   Hgb acceptable Continue low dose Retacrit  with dialysis.   3. Secondary Hyperparathyroidism: with outpatient labs: PTH 66 on 03/04/2024, phosphorus 5.1, calcium  8.6 on 05/19/24.   Lab Results  Component Value Date   CALCIUM  9.2 05/28/2024   PHOS 3.9 05/27/2024    Bone minerals within optimal range.    LOS: 6 Riley Martin 1/21/202610:49 AM   "

## 2024-05-28 NOTE — Progress Notes (Signed)
 Pt completed drinking bowel prep at 2230.  Pt has only had one small loose stool. Night time provider made aware.

## 2024-05-28 NOTE — Transfer of Care (Signed)
 Immediate Anesthesia Transfer of Care Note  Patient: Riley Martin  Procedure(s) Performed: COLONOSCOPY EGD (ESOPHAGOGASTRODUODENOSCOPY) COLONOSCOPY, WITH ARGON PLASMA COAGULATION  Patient Location: Endoscopy Unit  Anesthesia Type:General  Level of Consciousness: drowsy and patient cooperative  Airway & Oxygen Therapy: Patient Spontanous Breathing and Patient connected to face mask oxygen  Post-op Assessment: Report given to RN and Post -op Vital signs reviewed and stable  Post vital signs: Reviewed and stable  Last Vitals:  Vitals Value Taken Time  BP 107/67 05/28/24 13:13  Temp    Pulse 81 05/28/24 13:15  Resp 11 05/28/24 13:15  SpO2 100 % 05/28/24 13:15  Vitals shown include unfiled device data.  Last Pain:  Vitals:   05/28/24 1202  TempSrc: Temporal  PainSc: 7       Patients Stated Pain Goal: 2 (05/24/24 2330)  Complications: No notable events documented.

## 2024-05-28 NOTE — Op Note (Signed)
 [facility]astroenterology Patient Name: Riley Martin Procedure Date: 05/28/2024 11:35 AM MRN: [id number] Account #: [id number] Date of Birth: 08-08-1945 Admit Type: Inpatient Age: 79 Room: [facility] ENDO ROOM 3 Gender: Male Note Status: Finalized Instrument Name: Colon Scope [id number] Procedure:             Colonoscopy Indications:           Iron  deficiency anemia Providers:             Rogelia Copping MD, MD Referring MD:          Alda Carpen (Referring MD) Medicines:             Propofol  per Anesthesia Complications:         No immediate complications. Procedure:             Pre-Anesthesia Assessment:                        - Prior to the procedure, a History and Physical was                         performed, and patient medications and allergies were                         reviewed. The patient's tolerance of previous                         anesthesia was also reviewed. The risks and benefits                         of the procedure and the sedation options and risks                         were discussed with the patient. All questions were                         answered, and informed consent was obtained. Prior                         Anticoagulants: The patient has taken anticoagulant                         medication, last dose was 3 days prior to procedure.                         ASA Grade Assessment: II - A patient with mild                         systemic disease. After reviewing the risks and                         benefits, the patient was deemed in satisfactory                         condition to undergo the procedure.                        After obtaining informed consent, the colonoscope was  passed under direct vision. Throughout the procedure,                         the patient's blood pressure, pulse, and oxygen                         saturations were monitored continuously. The                         Colonoscope  was introduced through the anus and                         advanced to the the cecum, identified by appendiceal                         orifice and ileocecal valve. The colonoscopy was                         performed without difficulty. The patient tolerated                         the procedure well. The quality of the bowel                         preparation was poor. Findings:      The perianal and digital rectal examinations were normal.      A single small angiodysplastic lesion with bleeding was found in the       transverse colon. Coagulation for hemostasis using argon plasma at 2       liters/minute and 20 watts was successful. Impression:            - Preparation of the colon was poor.                        - A single bleeding colonic angiodysplastic lesion.                         Treated with argon plasma coagulation (APC).                        - No specimens collected. Recommendation:        - Return patient to hospital [facility] for ongoing care.                        - Resume previous diet.                        - Continue present medications.                        - Return to GI office. Procedure Code(s):     --- Professional ---                        [id number], Colonoscopy, flexible; with control of                         bleeding, any method Diagnosis Code(s):     --- Professional ---  D50.9, Iron  deficiency anemia, unspecified                        K55.21, Angiodysplasia of colon with hemorrhage CPT copyright 2022 American Medical Association. All rights reserved. The codes documented in this report are preliminary and upon coder review may  be revised to meet current compliance requirements. Rogelia Copping MD, MD 05/28/2024 1:10:17 PM This report has been signed electronically. Number of Addenda: 0 Note Initiated On: 05/28/2024 11:35 AM Scope Withdrawal Time: 0 hours 7 minutes 59 seconds  Total Procedure Duration: 0 hours 23 minutes 14 seconds   Estimated Blood Loss:  Estimated blood loss: none.      [facility]

## 2024-05-28 NOTE — TOC Progression Note (Addendum)
 Transition of Care [facility]) - Progression Note    Patient Details  Name: Riley Martin MRN: [id number] Date of Birth: 05-May-1946  Transition of Care [facility]) CM/SW Contact  Dalia GORMAN Fuse, RN Phone Number: 05/28/2024, 3:17 PM  Clinical Narrative:    Patient received a dialysis chair at [facility] [place]. He is on a TTS schedule. Plan to discharge to home on Fri or when medically clear.                      Expected Discharge Plan and Services                                               Social Drivers of Health (SDOH) Interventions SDOH Screenings   Food Insecurity: No Food Insecurity (05/22/2024)  Housing: Low Risk (05/22/2024)  Transportation Needs: No Transportation Needs (05/22/2024)  Utilities: Not At Risk (05/22/2024)  Financial Resource Strain: Low Risk  (07/12/2023)   Received from [facility]  Social Connections: Socially Integrated (05/22/2024)  Tobacco Use: Medium Risk (05/28/2024)    Readmission Risk Interventions     No data to display

## 2024-05-28 NOTE — Progress Notes (Signed)
 [facility]  - [place] [facility]   PATIENT NAME: Riley Martin    MR#:  [id number]  DATE OF BIRTH:  [birth year]  SUBJECTIVE:   Wife at bedside. Patient got back from EGD and colonoscopy. Tolerating PO diet. Patient to get hemodialysis tomorrow for him to get back on Tuesday Thursday Saturday schedule. Has chair time.   VITALS:  Blood pressure (!) 150/67, pulse 65, temperature (!) 96.3 F (35.7 C), temperature source Temporal, resp. rate 12, height 6' (1.829 m), weight 58.9 kg, SpO2 100%.  PHYSICAL EXAMINATION:   GENERAL:  79 y.o.-year-old patient with no acute distress.  LUNGS: distant breath sounds bilaterally, no wheezing, right IJ tunneled dialysis cath CARDIOVASCULAR: S1, S2 normal. No murmur   ABDOMEN: Soft, nontender, nondistended. Bowel sounds present.  EXTREMITIES: No  edema b/l.    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 Outpatient labs on 05/19/2024 showed a hemoglobin of 7.7 (down from baseline of 8.7) and creatinine of 5 (up from 4.4 on 12/19). Notably, the patient had been complaining of several weeks of intractable nausea and vomiting, decreased p.o. intake, worsening shortness of breath and DOE, with dark stools for the last 10 days and an episode of bright red blood while straining.   CT abdomen  pelvis showed large amount of retained stool throughout the colon consistent with constipation but no bowel obstruction or ileus and a nonobstructing 5 mm left renal calculus.   AKI on CKD Stage IV - Follows with Dr. Dominica in nephrology clinic. Was sent to the hospital due to concern for AKI on CKD stage IV and possible initiation of RRT in the setting of several weeks of N/V and decreased PO intake - Cr 5.12 on admission - Nephrology following - Underwent placement of RIJ tunneled HD catheter placement - Underwent second HD on 1/19, had to be cut short due to patient not feeling well - Cr improving to 3.15 - Plan for HD tomorrow per nephrology and ok to d/c. Pt has chairtime TTS with [facility]   Macrocytic anemia, chronic Anemia of CKD Concern for melena - Patient presenting with generalized weakness, SOB, DOE, reports of dark stools for 10 days - Possibly progression of anemia of CKD vs GI blood loss - Last EGD (10/2023) showed gastritis - Last colonoscopy (05/2021) showed a single nonbleeding colonic angiectasia treated with APC - FOBT ordered in ED, never collected - Anemia panel c/w anemia of CKD.  - Unclear if patient's stool are truly melanotic. Patient instructed to inform RN staff of BM in order to confirm stool, had none today.  - GI following, tentatively planned for EGD and colonoscopy on Monday 1/19 - Hgb stable at 9.1 --EGD:- Normal esophagus.                        -  Normal stomach.                        - Normal examined duodenum. --Colonoscopy - A single bleeding colonic angiodysplastic lesion.                         Treated with argon plasma coagulation (APC).                        - No specimens collected.   Hyperkalemia - resolved - In the setting of AKI on CKD stage IV - Correcting with HD, K 4.4   Nausea - Currently nauseous, but no vomiting - Has longstanding history of nausea with mostly negative workup previously including normal GES and normal EGD.  Query where worsened by uremia given worsening renal function - PRN Zofran , PRN Phenergan , PRN compazine  - PO intake is poor, will consult RD   Chronic hypoxic respiratory failure COPD - On 3L home O2 - Stable - Continue home inhalers formulary equivalent   Paroxysmal Afib - In NSR - Continue home amiodarone  200 mg daily - Eliquis  held for EGD/colonoscopy on 1/21   IBS-C - CT abd/pelvis showed large amount of retained stool throughout colon consistent with constipation Received bowel prep for colonoscopy  Spinal stenosis Chronic pain - On Oxycontin  10 mg TID and Percocet 10-325 q4h PRN - PDMP reviewed - Continue home pain regimen   BPH - Continue home flomax  0.4 mg daily  Procedures: EGD, colonoscopy, in-house dialysis, right IJ tunnel dialysis catheter placement Family communication : wife at bedside Consults : nephrology, G.I., vascular CODE STATUS: full DVT Prophylaxis : heparin  Level of care: Telemetry Status is: Inpatient Remains inpatient appropriate because: patient to get hemodialysis tomorrow and then discharge to home    TOTAL TIME TAKING CARE OF THIS PATIENT: 40 minutes.  >50% time spent on counselling and coordination of care  Note: This dictation was prepared with Dragon dictation along with smaller phrase technology. Any transcriptional errors that result from this process are unintentional.  Leita Blanch M.D    [facility]   CC: Primary care physician; Alla Amis, MD

## 2024-05-28 NOTE — Anesthesia Procedure Notes (Signed)
 Procedure Name: General with mask airway Date/Time: 05/28/2024 12:36 PM  Performed by: Ledora Duncan, CRNAPre-anesthesia Checklist: Patient identified, Emergency Drugs available, Suction available and Patient being monitored Patient Re-evaluated:Patient Re-evaluated prior to induction Oxygen Delivery Method: Simple face mask Induction Type: IV induction Placement Confirmation: positive ETCO2 and breath sounds checked- equal and bilateral Dental Injury: Teeth and Oropharynx as per pre-operative assessment

## 2024-05-28 NOTE — Care Plan (Signed)
 Patient s/p procedure tolerated well MD into talk with family - report given Family at bedside Phoned report to RN on unit

## 2024-05-28 NOTE — Anesthesia Preprocedure Evaluation (Addendum)
 "                                  Anesthesia Evaluation  Patient identified by MRN, date of birth, ID band Patient awake    Reviewed: Allergy & Precautions, NPO status , Patient's Chart, lab work & pertinent test results  History of Anesthesia Complications (+) PONV and history of anesthetic complications  Airway Mallampati: II  TM Distance: >3 FB Neck ROM: Full    Dental  (+) Upper Dentures   Pulmonary COPD,  COPD inhaler, Patient abstained from smoking., former smoker   Pulmonary exam normal  + decreased breath sounds      Cardiovascular Exercise Tolerance: Poor hypertension, Pt. on medications + dysrhythmias Atrial Fibrillation + pacemaker  Rhythm:Regular     Neuro/Psych  Headaches   Depression    negative neurological ROS  negative psych ROS   GI/Hepatic negative GI ROS, Neg liver ROS,GERD  Medicated,,  Endo/Other  negative endocrine ROSdiabetes    Renal/GU negative Renal ROS  negative genitourinary   Musculoskeletal  (+) Arthritis ,    Abdominal   Peds negative pediatric ROS (+)  Hematology negative hematology ROS (+) Blood dyscrasia, anemia   Anesthesia Other Findings Past Medical History: No date: A-fib (HCC) No date: Arthritis No date: BPH (benign prostatic hyperplasia) No date: Cancer (HCC)     Comment:  skin cancer No date: Chronic pain No date: COPD (chronic obstructive pulmonary disease) (HCC) No date: COVID No date: GERD (gastroesophageal reflux disease) No date: Headache No date: History of insomnia No date: IBS (irritable bowel syndrome) 07/12/2021: Iron  deficiency anemia due to chronic blood loss No date: Orthostatic dizziness No date: PONV (postoperative nausea and vomiting) No date: Spinal stenosis No date: Torn rotator cuff     Comment:  left  Past Surgical History: No date: COLONOSCOPY 05/13/2021: COLONOSCOPY WITH PROPOFOL ; N/A     Comment:  Procedure: COLONOSCOPY WITH PROPOFOL ;  Surgeon: Therisa Bi, MD;  Location: [facility] ENDOSCOPY;  Service:               Gastroenterology;  Laterality: N/A; 05/23/2024: DIALYSIS/PERMA CATHETER INSERTION; N/A     Comment:  Procedure: DIALYSIS/PERMA CATHETER INSERTION;  Surgeon:               Marea Selinda RAMAN, MD;  Location: [facility] INVASIVE CV LAB;                Service: Cardiovascular;  Laterality: N/A; 03/14/2021: ESOPHAGOGASTRODUODENOSCOPY; N/A     Comment:  Procedure: ESOPHAGOGASTRODUODENOSCOPY (EGD);  Surgeon:               Onita Elspeth Sharper, DO;  Location: [facility] ENDOSCOPY;                Service: Gastroenterology;  Laterality: N/A; 05/13/2021: ESOPHAGOGASTRODUODENOSCOPY; N/A     Comment:  Procedure: ESOPHAGOGASTRODUODENOSCOPY (EGD);  Surgeon:               Therisa Bi, MD;  Location: [facility] ENDOSCOPY;  Service:               Gastroenterology;  Laterality: N/A; 10/10/2023: ESOPHAGOGASTRODUODENOSCOPY; N/A     Comment:  Procedure: EGD (ESOPHAGOGASTRODUODENOSCOPY);  Surgeon:               Toledo, Ladell POUR, MD;  Location: [facility] ENDOSCOPY;  Service: Gastroenterology;  Laterality: N/A;  Eliquis  06/02/2015: EXCISION CHONCHA BULLOSA; Bilateral     Comment:  Procedure: BILATERAL CHONCHA BULLOSA;  Surgeon:               Carolee Hunter, MD;  Location: [facility] SURGERY CNTR;                Service: ENT;  Laterality: Bilateral; No date: HERNIA REPAIR 06/02/2015: MAXILLARY ANTROSTOMY; Bilateral     Comment:  Procedure: MAXILLARY ANTROSTOMY;  Surgeon: Carolee Hunter, MD;  Location: [facility] SURGERY CNTR;  Service:               ENT;  Laterality: Bilateral; No date: NASAL POLYP SURGERY 05/08/2006: NECK SURGERY     Comment:  no limitations 12/06/2023: PACEMAKER LEADLESS INSERTION; N/A     Comment:  Procedure: PACEMAKER LEADLESS INSERTION;  Surgeon:               Ammon Blunt, MD;  Location: [facility] INVASIVE CV               LAB;  Service: Cardiovascular;  Laterality: N/A; No date: UPPER GASTROINTESTINAL ENDOSCOPY  BMI    Body  Mass Index: 17.61 kg/m      Reproductive/Obstetrics negative OB ROS                              Anesthesia Physical Anesthesia Plan  ASA: 3  Anesthesia Plan: General   Post-op Pain Management:    Induction: Intravenous  PONV Risk Score and Plan: Propofol  infusion and TIVA  Airway Management Planned: Natural Airway and Nasal Cannula  Additional Equipment:   Intra-op Plan:   Post-operative Plan:   Informed Consent: I have reviewed the patients History and Physical, chart, labs and discussed the procedure including the risks, benefits and alternatives for the proposed anesthesia with the patient or authorized representative who has indicated his/her understanding and acceptance.     Dental Advisory Given  Plan Discussed with: CRNA  Anesthesia Plan Comments:          Anesthesia Quick Evaluation  "

## 2024-05-28 NOTE — Progress Notes (Signed)
 EGD normal. Colonoscopy without any large old or new blood seen. Small AVM treated in transverse colon. Can restart anticoagulation if needed and follow up as outpatient with [facility] for further outpatient workup.  I will sign off.  Please call if any further GI concerns or questions.  We would like to thank you for the opportunity to participate in the care of [facility].

## 2024-05-28 NOTE — Progress Notes (Signed)
 Pt accepted at [facility] on TTS at 11:30am. Pt can start 06/03/24 and will need to arrive at  11:15am for new pt paperwork.                     SABRA Suzen Satchel Dialysis Navigator [phone]

## 2024-05-28 NOTE — Anesthesia Postprocedure Evaluation (Signed)
"   Anesthesia Post Note  Patient: Riley Martin  Procedure(s) Performed: COLONOSCOPY EGD (ESOPHAGOGASTRODUODENOSCOPY) COLONOSCOPY, WITH ARGON PLASMA COAGULATION  Patient location during evaluation: PACU Anesthesia Type: General Level of consciousness: awake Pain management: pain level controlled Vital Signs Assessment: post-procedure vital signs reviewed and stable Respiratory status: spontaneous breathing Cardiovascular status: stable Anesthetic complications: no   No notable events documented.   Last Vitals:  Vitals:   05/28/24 1328 05/28/24 1330  BP: 122/70   Pulse: 77 80  Resp: (!) 9 10  Temp:    SpO2: 100% 100%    Last Pain:  Vitals:   05/28/24 1202  TempSrc: Temporal  PainSc: 7                  VAN STAVEREN,Kaidon Kinker      "

## 2024-05-28 NOTE — Op Note (Signed)
 [facility] Gastroenterology Patient Name: Riley Martin Procedure Date: 05/28/2024 11:36 AM MRN: [id number] Account #: [id number] Date of Birth: [birth year] Admit Type: Inpatient Age: 79 Room: [facility] ENDO ROOM 3 Gender: Male Note Status: Finalized Instrument Name: Upper GI Scope [id number] Procedure:             Upper GI endoscopy Indications:           Iron  deficiency anemia Providers:             Rogelia Copping MD, MD Referring MD:          Alda Carpen (Referring MD) Medicines:             Propofol  per Anesthesia Complications:         No immediate complications. Procedure:             Pre-Anesthesia Assessment:                        - Prior to the procedure, a History and Physical was                         performed, and patient medications and allergies were                         reviewed. The patient's tolerance of previous                         anesthesia was also reviewed. The risks and benefits                         of the procedure and the sedation options and risks                         were discussed with the patient. All questions were                         answered, and informed consent was obtained. Prior                         Anticoagulants: The patient has taken no anticoagulant                         or antiplatelet agents. ASA Grade Assessment: II - A                         patient with mild systemic disease. After reviewing                         the risks and benefits, the patient was deemed in                         satisfactory condition to undergo the procedure.                        After obtaining informed consent, the endoscope was                         passed under direct vision. Throughout the procedure,  the patient's blood pressure, pulse, and oxygen                         saturations were monitored continuously. The Endoscope                         was introduced through the mouth, and  advanced to the                         fourth part of duodenum. The upper GI endoscopy was                         accomplished without difficulty. The patient tolerated                         the procedure well. Findings:      The examined esophagus was normal.      The entire examined stomach was normal.      The examined duodenum was normal. Impression:            - Normal esophagus.                        - Normal stomach.                        - Normal examined duodenum.                        - No specimens collected. Recommendation:        - Return patient to hospital [facility] for ongoing care.                        - Resume previous diet.                        - Continue present medications.                        - Perform a colonoscopy today. Procedure Code(s):     --- Professional ---                        [phone], Esophagogastroduodenoscopy, flexible,                         transoral; diagnostic, including collection of                         specimen(s) by brushing or washing, when performed                         (separate procedure) Diagnosis Code(s):     --- Professional ---                        D50.9, Iron  deficiency anemia, unspecified CPT copyright 2022 American Medical Association. All rights reserved. The codes documented in this report are preliminary and upon coder review may  be revised to meet current compliance requirements. Rogelia Copping MD, MD 05/28/2024 12:42:31 PM This report has been signed electronically. Number of Addenda: 0 Note Initiated On: 05/28/2024 11:36 AM Estimated Blood  Loss:  Estimated blood loss: none.      [facility]

## 2024-05-28 NOTE — Progress Notes (Signed)
 Initial Nutrition Assessment  DOCUMENTATION CODES:   Underweight, Severe malnutrition in context of chronic illness  INTERVENTION:   -Continue liberalized diet of regular for widest variety of meal selections -Renal MVI daily -Boost Plus TID, each supplement provides 360 kcals and 14 grams protein -Magic cup TID with meals, each supplement provides 290 kcal and 9 grams of protein  -RD provided Nutrition For Dialysis handout from [facility]; attached to AVS/ discharge summary; patient will also have access to RD at outpatient HD center  NUTRITION DIAGNOSIS:   Severe Malnutrition related to chronic illness (COPD, ESRD on HD) as evidenced by moderate fat depletion, severe fat depletion, moderate muscle depletion, severe muscle depletion.  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  Patient admitted with AKI on CKD.   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 Dominica); there were concerns for AKI on CKD stage IV; HD  initiated due to several weeks of nausea and vomiting and decreased oral intake.   Per GI notes, procedure were delayed secondary to patient receiving eliquis  (nned to be off eliquid for 3 days due to CKD). Procedures scheduled today due to this.   Spoke with patient and wife at bedside. Wife provided the history as patient was lethargic due to just returning from endoscopy lab. Patient wife expresses concern that he has not eaten all day and hopes that he will be able to eat prior to HD today.   Patient wife reports that pt is not tolerating HD treatments well and they make him sick. She also expressed concern over poor oral intake secondary to significant nausea. Per patient, patient has experienced a general decline in health over the past 2 years which she attributes to poor oral intake secondary to extreme nausea and gastroparesis. She shares that medications do provide little relief and he typically takes reglan  at home. Per wife, nausea is exacerbated with all foods and does not notice a difference regardless of foods and eating patterns. Overall health and nausea has worsened more over the past 2 months, which has been very concerning for wife. Meal completions variable; noted meal completions 10-100%.   Wife estimates patient has lost about 60 pounds over the past 2 years. She is unsure of UBW, but states weight loss has been progressive but more extreme over the past 2 months. Reviewed weight history; patient has experienced a 5.6% weight loss over the past 3 months. While this is not significant for time frame, it is concerning given prolonged oral intake.   Per TOC notes, patient has an HD chair  at [facility] on a TTS schedule. Per HD coordinator, dry weight is 101.5 kg.   Emotional support provided to wife. Discussed importance of good meal and supplement intake to promote healing. Patient does not like Ensure supplements, however, does like chocolate Boost. Patient also requested  chocolate ice cream at time of visit; will trial Magic Cups. Reviewed labs; due to patient's poor oral intake and malnutrition, agree with liberalized diet of regular.   Medications reviewed and include phenergan , senokot, and miralax .   Labs reviewed: K and Phos WDL. Na: 130.    NUTRITION - FOCUSED PHYSICAL EXAM:  Flowsheet Row Most Recent Value  Orbital Region Severe depletion  Upper Arm Region Severe depletion  Thoracic and Lumbar Region Moderate depletion  Buccal Region Moderate depletion  Temple Region Moderate depletion  Clavicle Bone Region Severe depletion  Clavicle and Acromion Bone Region Severe depletion  Scapular Bone Region Severe depletion  Dorsal Hand Moderate depletion  Patellar Region Moderate depletion  Anterior Thigh Region Moderate depletion  Posterior Calf Region Moderate depletion  Edema (RD Assessment) Mild  Hair Reviewed  Eyes Reviewed  Mouth Reviewed  Skin Reviewed  Nails Reviewed    Diet Order:   Diet Order             Diet regular Fluid consistency: Thin  Diet effective now                   EDUCATION NEEDS:   Education needs have been addressed  Skin:  Skin Assessment: Reviewed RN Assessment  Last BM:  05/28/24  Height:   Ht Readings from Last 1 Encounters:  05/21/24 6' (1.829 m)    Weight:   Wt Readings from Last 1 Encounters:  05/26/24 58.9 kg    Ideal Body Weight:  80.9 kg  BMI:  Body mass index is 17.61 kg/m.  Estimated Nutritional Needs:   Kcal:  1850-2050  Protein:  105-120 grams  Fluid:  1000 ml + UOP    Margery ORN, RD, LDN, CDCES Registered Dietitian III Certified Diabetes Care and Education Specialist If unable to reach this RD, please use RD Inpatient group chat on secure chat between hours of 8am-4 pm daily

## 2024-05-28 NOTE — Progress Notes (Signed)
Per Dr Posey Pronto, d.c tele monitoring

## 2024-05-29 DIAGNOSIS — D649 Anemia, unspecified: Secondary | ICD-10-CM | POA: Diagnosis not present

## 2024-05-29 DIAGNOSIS — E43 Unspecified severe protein-calorie malnutrition: Secondary | ICD-10-CM | POA: Insufficient documentation

## 2024-05-29 LAB — CBC WITH DIFFERENTIAL/PLATELET
Abs Immature Granulocytes: 0.02 K/uL (ref 0.00–0.07)
Basophils Absolute: 0 K/uL (ref 0.0–0.1)
Basophils Relative: 1 %
Eosinophils Absolute: 0.5 K/uL (ref 0.0–0.5)
Eosinophils Relative: 9 %
HCT: 23.6 % — ABNORMAL LOW (ref 39.0–52.0)
Hemoglobin: 7.7 g/dL — ABNORMAL LOW (ref 13.0–17.0)
Immature Granulocytes: 0 %
Lymphocytes Relative: 28 %
Lymphs Abs: 1.7 K/uL (ref 0.7–4.0)
MCH: 33.5 pg (ref 26.0–34.0)
MCHC: 32.6 g/dL (ref 30.0–36.0)
MCV: 102.6 fL — ABNORMAL HIGH (ref 80.0–100.0)
Monocytes Absolute: 0.5 K/uL (ref 0.1–1.0)
Monocytes Relative: 9 %
Neutro Abs: 3.2 K/uL (ref 1.7–7.7)
Neutrophils Relative %: 53 %
Platelets: 149 K/uL — ABNORMAL LOW (ref 150–400)
RBC: 2.3 MIL/uL — ABNORMAL LOW (ref 4.22–5.81)
RDW: 12.2 % (ref 11.5–15.5)
WBC: 6 K/uL (ref 4.0–10.5)
nRBC: 0 % (ref 0.0–0.2)

## 2024-05-29 LAB — RENAL FUNCTION PANEL
Albumin: 3.6 g/dL (ref 3.5–5.0)
Anion gap: 8 (ref 5–15)
BUN: 35 mg/dL — ABNORMAL HIGH (ref 8–23)
CO2: 28 mmol/L (ref 22–32)
Calcium: 8.8 mg/dL — ABNORMAL LOW (ref 8.9–10.3)
Chloride: 97 mmol/L — ABNORMAL LOW (ref 98–111)
Creatinine, Ser: 4 mg/dL — ABNORMAL HIGH (ref 0.61–1.24)
GFR, Estimated: 14 mL/min — ABNORMAL LOW
Glucose, Bld: 100 mg/dL — ABNORMAL HIGH (ref 70–99)
Phosphorus: 4.3 mg/dL (ref 2.5–4.6)
Potassium: 4.7 mmol/L (ref 3.5–5.1)
Sodium: 133 mmol/L — ABNORMAL LOW (ref 135–145)

## 2024-05-29 MED ORDER — TORSEMIDE 20 MG PO TABS
40.0000 mg | ORAL_TABLET | ORAL | Status: DC
  Filled 2024-05-29: qty 2

## 2024-05-29 MED ORDER — DICYCLOMINE HCL 10 MG PO CAPS
10.0000 mg | ORAL_CAPSULE | Freq: Once | ORAL | Status: AC
  Administered 2024-05-29: 10 mg via ORAL
  Filled 2024-05-29: qty 1

## 2024-05-29 MED ORDER — EPOETIN ALFA-EPBX 4000 UNIT/ML IJ SOLN
INTRAMUSCULAR | Status: AC
  Filled 2024-05-29: qty 1

## 2024-05-29 MED ORDER — FUROSEMIDE 40 MG PO TABS
40.0000 mg | ORAL_TABLET | ORAL | Status: DC
  Filled 2024-05-29: qty 1

## 2024-05-29 MED ORDER — HEPARIN SODIUM (PORCINE) 1000 UNIT/ML IJ SOLN
INTRAMUSCULAR | Status: AC
  Filled 2024-05-29: qty 5

## 2024-05-29 MED ORDER — MORPHINE SULFATE (PF) 2 MG/ML IV SOLN
INTRAVENOUS | Status: AC
  Filled 2024-05-29: qty 1

## 2024-05-29 MED ORDER — TORSEMIDE 20 MG PO TABS: ORAL_TABLET | ORAL | 1 refills | Status: AC

## 2024-05-29 NOTE — Discharge Instructions (Signed)
 Go to outpatient dialysis as per instructions given

## 2024-05-29 NOTE — Progress Notes (Addendum)
 " [facility]  ROUNDING NOTE   Subjective:   Riley Martin is a 79 year old male with past medical conditions including COPD, BPH, atrial fibrillation on Eliquis , IBS-C, pacemaker, anemia, and chronic kidney disease stage IV.  Patient presents to the emergency department due to abnormal labs per nephrology and has been admitted under observation for AKI (acute kidney injury) [N17.9] Symptomatic anemia [D64.9] Anemia due to chronic kidney disease, unspecified CKD stage [N18.9, D63.1]  Patient is known to our practice and receives outpatient follow-up with Dr. Korrapati.    Update  Patient seen and evaluated during dialysis   HEMODIALYSIS FLOWSHEET:  Blood Flow Rate (mL/min): 399 mL/min Arterial Pressure (mmHg): -153.32 mmHg Venous Pressure (mmHg): 174.33 mmHg TMP (mmHg): 10.1 mmHg Ultrafiltration Rate (mL/min): 693 mL/min Dialysate Flow Rate (mL/min): 299 ml/min Bolus Amount (mL): 400 mL  Complaining of abdominal pain, awaiting medication.  Objective:  Vital signs in last 24 hours:  Temp:  [96.3 F (35.7 C)-98.4 F (36.9 C)] 98.4 F (36.9 C) (01/22 0839) Pulse Rate:  [58-80] 69 (01/22 1030) Resp:  [9-17] 11 (01/22 1030) BP: (107-150)/(45-71) 117/71 (01/22 1030) SpO2:  [96 %-100 %] 100 % (01/22 1030)  Weight change:  Filed Weights   05/24/24 1626 05/25/24 0559 05/26/24 1354  Weight: 54.8 kg 56.1 kg 58.9 kg    Intake/Output: I/O last 3 completed shifts: In: 576.1 [I.V.:476.1; IV Piggyback:100] Out: -    Intake/Output this shift:  No intake/output data recorded.  Physical Exam: General: NAD  Head: Normocephalic, atraumatic. Moist oral mucosal membranes  Eyes: Anicteric  Lungs:  Clear to auscultation, normal effort  Heart: Regular rate and rhythm  Abdomen:  Soft, nontender  Extremities:  No peripheral edema.  Neurologic: Awake, alert, conversant  Skin: Warm,dry, no rash  Access: Rt internal jugular permcath    Basic Metabolic Panel: Recent  Labs  Lab 05/24/24 0700 05/25/24 0513 05/26/24 1400 05/27/24 0450 05/28/24 0456 05/29/24 0724  NA 136 134* 136 131* 130* 133*  K 5.2* 5.0 4.8 4.4 4.4 4.7  CL 99 97* 99 94* 92* 97*  CO2 29 29 28 29 25 28   GLUCOSE 95 97 91 86 87 100*  BUN 33* 22 34* 23 29* 35*  CREATININE 3.82* 2.84* 3.68* 3.15* 3.73* 4.00*  CALCIUM  9.2 9.3 8.5* 8.6* 9.2 8.8*  PHOS 4.2 4.0 4.2 3.9  --  4.3    Liver Function Tests: Recent Labs  Lab 05/24/24 0700 05/25/24 0513 05/26/24 1400 05/27/24 0450 05/29/24 0724  ALBUMIN  4.0 4.0 3.9 3.9 3.6   No results for input(s): LIPASE, AMYLASE in the last 168 hours. No results for input(s): AMMONIA in the last 168 hours.  CBC: Recent Labs  Lab 05/25/24 0513 05/26/24 1400 05/27/24 0450 05/28/24 0456 05/29/24 0724  WBC 6.8 5.6 5.9 8.3 6.0  NEUTROABS  --   --   --   --  3.2  HGB 8.4* 8.4* 8.4* 9.1* 7.7*  HCT 26.2* 25.4* 25.8* 27.1* 23.6*  MCV 104.4* 102.4* 102.8* 101.5* 102.6*  PLT 184 164 171 211 149*    Cardiac Enzymes: No results for input(s): CKTOTAL, CKMB, CKMBINDEX, TROPONINI in the last 168 hours.  BNP: Invalid input(s): POCBNP  CBG: No results for input(s): GLUCAP in the last 168 hours.  Microbiology: Results for orders placed or performed during the hospital encounter of 02/21/24  Resp panel by RT-PCR (RSV, Flu A&B, Covid) Anterior Nasal Swab     Status: None   Collection Time: 02/22/24 12:32 AM   Specimen: Anterior  Nasal Swab  Result Value Ref Range Status   SARS Coronavirus 2 by RT PCR NEGATIVE NEGATIVE Final    Comment: (NOTE) SARS-CoV-2 target nucleic acids are NOT DETECTED.  The SARS-CoV-2 RNA is generally detectable in upper respiratory specimens during the acute phase of infection. The lowest concentration of SARS-CoV-2 viral copies this assay can detect is 138 copies/mL. A negative result does not preclude SARS-Cov-2 infection and should not be used as the sole basis for treatment or other patient  management decisions. A negative result may occur with  improper specimen collection/handling, submission of specimen other than nasopharyngeal swab, presence of viral mutation(s) within the areas targeted by this assay, and inadequate number of viral copies(<138 copies/mL). A negative result must be combined with clinical observations, patient history, and epidemiological information. The expected result is Negative.  Fact Sheet for Patients:  bloggercourse.com  Fact Sheet for Healthcare Providers:  seriousbroker.it  This test is no t yet approved or cleared by the [place]  FDA and  has been authorized for detection and/or diagnosis of SARS-CoV-2 by FDA under an Emergency Use Authorization (EUA). This EUA will remain  in effect (meaning this test can be used) for the duration of the COVID-19 declaration under Section 564(b)(1) of the Act, 21 U.S.C.section 360bbb-3(b)(1), unless the authorization is terminated  or revoked sooner.       Influenza A by PCR NEGATIVE NEGATIVE Final   Influenza B by PCR NEGATIVE NEGATIVE Final    Comment: (NOTE) The Xpert Xpress SARS-CoV-2/FLU/RSV plus assay is intended as an aid in the diagnosis of influenza from Nasopharyngeal swab specimens and should not be used as a sole basis for treatment. Nasal washings and aspirates are unacceptable for Xpert Xpress SARS-CoV-2/FLU/RSV testing.  Fact Sheet for Patients: bloggercourse.com  Fact Sheet for Healthcare Providers: seriousbroker.it  This test is not yet approved or cleared by the [place]  FDA and has been authorized for detection and/or diagnosis of SARS-CoV-2 by FDA under an Emergency Use Authorization (EUA). This EUA will remain in effect (meaning this test can be used) for the duration of the COVID-19 declaration under Section 564(b)(1) of the Act, 21 U.S.C. section 360bbb-3(b)(1),  unless the authorization is terminated or revoked.     Resp Syncytial Virus by PCR NEGATIVE NEGATIVE Final    Comment: (NOTE) Fact Sheet for Patients: bloggercourse.com  Fact Sheet for Healthcare Providers: seriousbroker.it  This test is not yet approved or cleared by the [place]  FDA and has been authorized for detection and/or diagnosis of SARS-CoV-2 by FDA under an Emergency Use Authorization (EUA). This EUA will remain in effect (meaning this test can be used) for the duration of the COVID-19 declaration under Section 564(b)(1) of the Act, 21 U.S.C. section 360bbb-3(b)(1), unless the authorization is terminated or revoked.  Performed at [facility], [address] [place], [place] [address]     Coagulation Studies: No results for input(s): LABPROT, INR in the last 72 hours.  Urinalysis: No results for input(s): COLORURINE, LABSPEC, PHURINE, GLUCOSEU, HGBUR, BILIRUBINUR, KETONESUR, PROTEINUR, UROBILINOGEN, NITRITE, LEUKOCYTESUR in the last 72 hours.  Invalid input(s): APPERANCEUR     Imaging: No results found.    Medications:    promethazine  (PHENERGAN ) injection (IM or IVPB) Stopped (05/26/24 0523)    amiodarone   200 mg Oral Daily   Chlorhexidine  Gluconate Cloth  6 each Topical Q0600   epoetin  alfa-epbx (RETACRIT ) injection  4,000 Units Intravenous Q M,W,F-1800   escitalopram   5 mg Oral Daily   fluticasone  furoate-vilanterol  1 puff Inhalation Daily   heparin   5,000 Units Subcutaneous Q8H   lactose free nutrition  237 mL Oral TID WC   multivitamin  1 tablet Oral QHS   oxyCODONE   10 mg Oral TID   polyethylene glycol  17 g Oral Daily   senna  2 tablet Oral QHS   tamsulosin   0.4 mg Oral Daily   [START ON 05/30/2024] torsemide   40 mg Oral Once per day on Sunday Monday Wednesday Friday   acetaminophen  **OR** acetaminophen , ipratropium-albuterol , morphine  injection,  ondansetron  **OR** ondansetron  (ZOFRAN ) IV, oxyCODONE -acetaminophen  **AND** oxyCODONE , prochlorperazine , promethazine  (PHENERGAN ) injection (IM or IVPB), traZODone   Assessment/ Plan:  Mr. Riley Martin is a 79 y.o.  male with past medical conditions including COPD, BPH, atrial fibrillation on Eliquis , IBS-C, pacemaker, anemia, and chronic kidney disease stage IV.  Patient presents to the emergency department due to abnormal labs per nephrology and has been admitted under observation for AKI (acute kidney injury) [N17.9] Symptomatic anemia [D64.9] Anemia due to chronic kidney disease, unspecified CKD stage [N18.9, D63.1]   Acute Kidney Injury with hyperkalemia on chronic kidney disease stage IV with baseline creatinine 4.4 and GFR of 130 on 04/25/24.  Acute kidney injury vs progression of kidney disease.  CT abdomen pelvis shows nonobstructive left renal calculi.  Potassium 5.5, given Veltassa  25.2 g.  Discussed with patient and family that we feel this is a progression of kidney disease that has led to requiring renal replacement therapy.  Family is agreeable to proceed.    Receiving dialysis today, UF goal 1 to 1.5 L as tolerated.  If stable, patient cleared to discharge after dialysis and resume outpatient treatments on Tuesday. Recommend Torsemide  40mg  on nondialysis days.   Lab Results  Component Value Date   CREATININE 4.00 (H) 05/29/2024   CREATININE 3.73 (H) 05/28/2024   CREATININE 3.15 (H) 05/27/2024    Intake/Output Summary (Last 24 hours) at 05/29/2024 1118 Last data filed at 05/28/2024 1306 Gross per 24 hour  Intake 300 ml  Output --  Net 300 ml   2. Anemia of chronic kidney disease Lab Results  Component Value Date   HGB 7.7 (L) 05/29/2024   Hgb 7.7, May require increase in ESA. Continue low dose Retacrit  with dialysis.   3. Secondary Hyperparathyroidism: with outpatient labs: PTH 66 on 03/04/2024, phosphorus 5.1, calcium  8.6 on 05/19/24.   Lab Results  Component  Value Date   CALCIUM  8.8 (L) 05/29/2024   PHOS 4.3 05/29/2024    Calcium  and phosphorus within optimal range.  Will continue to monitor as outpatient.   LOS: 7 Ayson Cherubini 1/22/202611:18 AM   "

## 2024-05-29 NOTE — TOC CM/SW Note (Signed)
 Transition of Care Department [facility]) has reviewed patient and no TOC needs have been identified at this time.  If new patient transition needs arise, please place a TOC consult.  Patient lives in home with his wife in single family home.  He does drive before hospital admit. No issues obtaining or paying for medications. Has walker, cane, grab bars in shower, no DME needs.

## 2024-05-29 NOTE — Discharge Summary (Signed)
 " Physician Discharge Summary   Patient: Riley Martin MRN: [id number] DOB: 08/13/1945  Admit date:     05/21/2024  Discharge date: 05/29/24  Discharge Physician: Leita Blanch   PCP: Alla Amis, MD   Recommendations at discharge:   resume your outpatient hemodialysis as per instructions on Tuesday Thursday and Saturday with [facility] follow-up PCP in 1 to 2 week  Discharge Diagnoses: Principal Problem:   Symptomatic anemia Active Problems:   History of anemia due to chronic kidney disease   Acute renal failure superimposed on stage 4 chronic kidney disease (HCC)   Hyperkalemia   Nausea and vomiting   COPD (chronic obstructive pulmonary disease) (HCC)   Chronic respiratory failure with hypoxia (HCC)   Paroxysmal atrial fibrillation (HCC)   History of cardiac pacemaker   BPH (benign prostatic hyperplasia)   Type II diabetes mellitus with renal manifestations (HCC)   Irritable bowel syndrome with constipation   Spinal stenosis   Chronic pain   Angiodysplasia of intestine with hemorrhage   Protein-calorie malnutrition, severe   patient is an 79 year old male with PMHx of paroxysmal A-fib on Eliquis , complete heart block s/p PPM, CKD stage IV, COPD, 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 Outpatient labs on 05/19/2024 showed a hemoglobin of 7.7 (down from baseline of 8.7) and creatinine of 5 (up from 4.4 on 12/19). Notably, the patient had been complaining of several weeks of intractable nausea and vomiting, decreased p.o. intake, worsening shortness of breath and DOE, with dark stools for the last 10 days and an episode of bright red blood while straining.    CT abdomen pelvis showed large amount of retained stool throughout the colon consistent with constipation but  no bowel obstruction or ileus and a nonobstructing 5 mm left renal calculus.    AKI on CKD Stage IV - Follows with Dr. Dominica in nephrology clinic. Was sent to the hospital due to concern for AKI on CKD stage IV and possible initiation of RRT in the setting of several weeks of N/V and decreased PO intake - Cr 5.12 on admission - Nephrology following - Underwent placement of RIJ tunneled HD catheter placement - Underwent second HD on 1/19, had to be cut short due to patient not feeling well - Cr improving to 3.15 - Plan for HD tomorrow per nephrology and ok to d/c. Pt has chairtime TTS with [facility]   Macrocytic anemia, chronic Anemia of CKD Concern for melena - Patient presenting with generalized weakness, SOB, DOE, reports of dark stools for 10 days - Possibly progression of anemia of CKD vs GI blood loss - Last EGD (10/2023) showed gastritis - Last colonoscopy (05/2021) showed a single nonbleeding colonic angiectasia treated with APC - FOBT ordered in ED, never collected - Anemia panel c/w anemia of CKD.  - Unclear if patient's stool are truly melanotic. Patient instructed to inform RN staff of BM in order to confirm stool, had none today.  - GI following, tentatively planned for EGD and colonoscopy on Monday 1/19 - Hgb stable at 9.1 --EGD:- Normal esophagus.                        - Normal stomach.                        -  Normal examined duodenum. --Colonoscopy - A single bleeding colonic angiodysplastic lesion.                         Treated with argon plasma coagulation (APC).                        - No specimens collected.   Hyperkalemia - resolved - In the setting of AKI on CKD stage IV - Correcting with HD, K 4.4   Nausea - Currently nauseous, but no vomiting - Has longstanding history of nausea with mostly negative workup previously including normal GES and normal EGD. Query where worsened by uremia given worsening renal function - PRN Zofran , PRN Phenergan , PRN  compazine  - PO intake is poor, will consult RD   Chronic hypoxic respiratory failure COPD - On 3L home O2 - Stable - Continue home inhalers formulary equivalent   Paroxysmal Afib - In NSR - Continue home amiodarone  200 mg daily - Eliquis  held for EGD/colonoscopy on 1/21--now resumed at discharge   IBS-C - CT abd/pelvis showed large amount of retained stool throughout colon consistent with constipation Received bowel prep for colonoscopy   Spinal stenosis Chronic pain - On Oxycontin   and Percocet prn - PDMP reviewed - Continue home pain regimen--pt follows at pain clinic   BPH - Continue home flomax    Nutrition Status: Nutrition Problem: Severe Malnutrition Etiology: chronic illness (COPD, ESRD on HD) Signs/Symptoms: moderate fat depletion, severe fat depletion, moderate muscle depletion, severe muscle depletion Interventions: MVI, Boost Plus, Liberalize Diet    Overall improving slowly. D/w pt and wife d/c plans and they are agreeable   Procedures: EGD, colonoscopy, in-house dialysis, right IJ tunnel dialysis catheter placement Family communication : wife at bedside Consults : nephrology, G.I., vascular CODE STATUS: full DVT Prophylaxis : heparin      Pain control - [place]  Controlled Substance Reporting System database was reviewed. and patient was instructed, not to drive, operate heavy machinery, perform activities at heights, swimming or participation in water activities or provide baby-sitting services while on Pain, Sleep and Anxiety Medications; until their outpatient Physician has advised to do so again. Also recommended to not to take more than prescribed Pain, Sleep and Anxiety Medications.  Disposition: Home Diet recommendation:  Renal diet DISCHARGE MEDICATION: Allergies as of 05/29/2024       Reactions   Iodinated Contrast Media Other (See Comments), Nausea Only   Dry heaves, patient states he felt like he was on fire and about to pass out.   Pre-syncope, burning  Dry heaves, patient states he felt like he was on fire and about to pass out.    Lisinopril  Swelling   Tongue swelling, neck pain and Headaches    Tramadol Other (See Comments), Shortness Of Breath   Other reaction(s): Other (See Comments) Other Reaction: tachy Dizziness and unsteady gait.   Pregabalin Palpitations   Other reaction(s): Other (See Comments) Other Reaction: edema        Medication List     STOP taking these medications    furosemide  20 MG tablet Commonly known as: LASIX    hydrocortisone  2.5 % cream   mirtazapine  15 MG tablet Commonly known as: REMERON    spironolactone 25 MG tablet Commonly known as: ALDACTONE       TAKE these medications    amiodarone  200 MG tablet Commonly known as: PACERONE  Take 1 tablet (200 mg total) by mouth daily. Reduced from twice daily.  Home  med.   apixaban  2.5 MG Tabs tablet Commonly known as: ELIQUIS  Take by mouth.   B-12 2500 MCG Subl Place 1 tablet under the tongue daily.   budesonide  1 MG/2ML nebulizer solution Commonly known as: PULMICORT  Take 1 mg by nebulization daily.   dicyclomine  10 MG capsule Commonly known as: BENTYL  Take 10 mg by mouth 3 (three) times daily before meals.   erythromycin ophthalmic ointment Place 1 Application into both eyes at bedtime.   escitalopram  5 MG tablet Commonly known as: LEXAPRO  Take 5 mg by mouth daily.   feeding supplement (NEPRO CARB STEADY) Liqd Take 237 mLs by mouth 3 (three) times daily between meals.   fluticasone  50 MCG/ACT nasal spray Commonly known as: FLONASE  SPRAY 2 SPRAYS INTO EACH NOSTRIL EVERY DAY   ipratropium 0.02 % nebulizer solution Commonly known as: ATROVENT  Take 0.5 mg by nebulization 2 (two) times daily.   ipratropium-albuterol  0.5-2.5 (3) MG/3ML Soln Commonly known as: DUONEB Inhale 3 mLs into the lungs every 6 (six) hours as needed.   mometasone -formoterol  200-5 MCG/ACT Aero Commonly known as: DULERA  Inhale 2  puffs into the lungs 2 (two) times daily.   multivitamin with minerals Tabs tablet Take 1 tablet by mouth daily.   naloxone  4 MG/0.1ML Liqd nasal spray kit Commonly known as: NARCAN  Place 0.4 mg into the nose once. CALL 911. SPR CONTENTS OF ONE SPRAYER (0.1ML) INTO ONE NOSTRIL. REPEAT IN 2-3 MIN IF SYMPTOMS OF OPIOID EMERGENCY PERSIST, ALTERNATE NOSTRILS 11/22/2021   omeprazole 40 MG capsule Commonly known as: PRILOSEC Take 40 mg by mouth 2 (two) times daily.   ondansetron  4 MG disintegrating tablet Commonly known as: ZOFRAN -ODT Take 1 tablet (4 mg total) by mouth every 8 (eight) hours as needed for nausea or vomiting.   oxyCODONE -acetaminophen  10-325 MG tablet Commonly known as: PERCOCET Take 1 tablet by mouth 5 (five) times daily as needed.   OxyCONTIN  10 MG 12 hr tablet Generic drug: oxyCODONE  Take 10 mg by mouth 3 (three) times daily.   OXYGEN Inhale 2 L into the lungs at bedtime as needed.   sodium chloride  0.65 % Soln nasal spray Commonly known as: OCEAN Place 1 spray into both nostrils as needed for congestion.   tamsulosin  0.4 MG Caps capsule Commonly known as: FLOMAX  Take 0.4 mg by mouth daily.   torsemide  20 MG tablet Commonly known as: DEMADEX  Take 2 tablets (40 mg) by mouth once daily on Sunday, Monday, Wednesday, and Friday (Non-dialysis days).   traZODone  50 MG tablet Commonly known as: DESYREL  Take 25 mg by mouth at bedtime as needed.   Ventolin  HFA 108 (90 Base) MCG/ACT inhaler Generic drug: albuterol  Inhale 2 puffs into the lungs every 6 (six) hours as needed.   albuterol  (2.5 MG/3ML) 0.083% nebulizer solution Commonly known as: PROVENTIL  Take 3 mLs by nebulization every 6 (six) hours as needed for wheezing or shortness of breath.        Follow-up Information     Alla Amis, MD. Go on 06/05/2024.   Specialty: Family Medicine Why: 06/05/2024 11am Contact information: [address] [facility] [place]  [address] [phone]         [facility] Follow up on 05/27/2024.   Why: Pt Dialysis days are Tuesday, Thursday, Saturday with chair time at 10:30am. for pt first day pat would need to arrive at 10:00am for new pt paperwork. Contact information: [address] [place] [address] [phone]  Dialysis, [facility] Follow up on 06/03/2024.   Why: Pt dialysis days are Tuesday, Thursday, Saturday with a chair time of 11:30. Pt would need to arrive at 11:15am on first day for new patient paperwork Contact information: [address] [place] [address] [phone]                Discharge Exam: Filed Weights   05/24/24 1626 05/25/24 0559 05/26/24 1354  Weight: 54.8 kg 56.1 kg 58.9 kg   GENERAL:  79 y.o.-year-old patient with no acute distress.  LUNGS: distant breath sounds bilaterally, no wheezing, right IJ tunneled dialysis cath CARDIOVASCULAR: S1, S2 normal. No murmur   ABDOMEN: Soft, nontender, nondistended. Bowel sounds present.  EXTREMITIES: No  edema b/l.    NEUROLOGIC: nonfocal  patient is alert and awake  Condition at discharge: fair  The results of significant diagnostics from this hospitalization (including imaging, microbiology, ancillary and laboratory) are listed below for reference.   Imaging Studies: PERIPHERAL VASCULAR CATHETERIZATION Result Date: 05/23/2024 See surgical note for result.  DG Chest 1 View Result Date: 05/22/2024 EXAM: 1 VIEW(S) XRAY OF THE CHEST 05/22/2024 03:39:25 PM COMPARISON: 02/22/2024 CLINICAL HISTORY: Acute kidney failure FINDINGS: LINES, TUBES AND DEVICES: Leadless pacemaker in place. LUNGS AND PLEURA: Chronic hyperinflation. Emphysema. No focal pulmonary opacity. No pleural effusion. No pneumothorax. HEART AND MEDIASTINUM: Aortic atherosclerosis. No acute abnormality of the cardiac and mediastinal silhouettes. BONES AND SOFT TISSUES: No acute osseous abnormality. IMPRESSION: 1. No acute  findings. 2. Emphysema. Electronically signed by: Waddell Calk MD 05/22/2024 04:09 PM EST RP Workstation: [id number]   CT ABDOMEN PELVIS WO CONTRAST Result Date: 05/21/2024 CLINICAL DATA:  Nausea/vomiting/diarrhea for 3 weeks, dark stools, fatigue EXAM: CT ABDOMEN AND PELVIS WITHOUT CONTRAST TECHNIQUE: Multidetector CT imaging of the abdomen and pelvis was performed following the standard protocol without IV contrast. RADIATION DOSE REDUCTION: This exam was performed according to the departmental dose-optimization program which includes automated exposure control, adjustment of the mA and/or kV according to patient size and/or use of iterative reconstruction technique. COMPARISON:  04/25/2024 FINDINGS: Lower chest: No acute pleural or parenchymal lung disease. Hepatobiliary: Unremarkable unenhanced appearance of the liver and gallbladder. Pancreas: Unremarkable unenhanced appearance. Spleen: Unremarkable unenhanced appearance. Adrenals/Urinary Tract: Nonobstructing 5 mm calculus lower pole right kidney. No right-sided calculi. No obstructive uropathy within either kidney. The adrenals and bladder are unremarkable. Stomach/Bowel: No bowel obstruction or ileus. Large amount of stool throughout the colon consistent with constipation. The appendix, if still present, is not well visualized. Scattered sigmoid diverticulosis without diverticulitis. No bowel wall thickening or inflammatory change. Vascular/Lymphatic: Aortic atherosclerosis. No enlarged abdominal or pelvic lymph nodes. Reproductive: Prostate is unremarkable. Other: No free fluid or free intraperitoneal gas. Small fat containing umbilical hernia. Musculoskeletal: No acute or destructive bony abnormalities. Reconstructed images demonstrate no additional findings. IMPRESSION: 1. Large amount of retained stool throughout the colon, consistent with constipation. No bowel obstruction or ileus. 2. Nonobstructing 5 mm left renal calculus. 3.  Aortic  Atherosclerosis (ICD10-I70.0). Electronically Signed   By: Ozell Daring M.D.   On: 05/21/2024 20:27    Microbiology: Results for orders placed or performed during the hospital encounter of 02/21/24  Resp panel by RT-PCR (RSV, Flu A&B, Covid) Anterior Nasal Swab     Status: None   Collection Time: 02/22/24 12:32 AM   Specimen: Anterior Nasal Swab  Result Value Ref Range Status   SARS Coronavirus 2 by RT PCR NEGATIVE NEGATIVE Final    Comment: (NOTE) SARS-CoV-2 target nucleic acids  are NOT DETECTED.  The SARS-CoV-2 RNA is generally detectable in upper respiratory specimens during the acute phase of infection. The lowest concentration of SARS-CoV-2 viral copies this assay can detect is 138 copies/mL. A negative result does not preclude SARS-Cov-2 infection and should not be used as the sole basis for treatment or other patient management decisions. A negative result may occur with  improper specimen collection/handling, submission of specimen other than nasopharyngeal swab, presence of viral mutation(s) within the areas targeted by this assay, and inadequate number of viral copies(<138 copies/mL). A negative result must be combined with clinical observations, patient history, and epidemiological information. The expected result is Negative.  Fact Sheet for Patients:  bloggercourse.com  Fact Sheet for Healthcare Providers:  seriousbroker.it  This test is no t yet approved or cleared by the [place]  FDA and  has been authorized for detection and/or diagnosis of SARS-CoV-2 by FDA under an Emergency Use Authorization (EUA). This EUA will remain  in effect (meaning this test can be used) for the duration of the COVID-19 declaration under Section 564(b)(1) of the Act, 21 U.S.C.section 360bbb-3(b)(1), unless the authorization is terminated  or revoked sooner.       Influenza A by PCR NEGATIVE NEGATIVE Final   Influenza B by PCR  NEGATIVE NEGATIVE Final    Comment: (NOTE) The Xpert Xpress SARS-CoV-2/FLU/RSV plus assay is intended as an aid in the diagnosis of influenza from Nasopharyngeal swab specimens and should not be used as a sole basis for treatment. Nasal washings and aspirates are unacceptable for Xpert Xpress SARS-CoV-2/FLU/RSV testing.  Fact Sheet for Patients: bloggercourse.com  Fact Sheet for Healthcare Providers: seriousbroker.it  This test is not yet approved or cleared by the [place]  FDA and has been authorized for detection and/or diagnosis of SARS-CoV-2 by FDA under an Emergency Use Authorization (EUA). This EUA will remain in effect (meaning this test can be used) for the duration of the COVID-19 declaration under Section 564(b)(1) of the Act, 21 U.S.C. section 360bbb-3(b)(1), unless the authorization is terminated or revoked.     Resp Syncytial Virus by PCR NEGATIVE NEGATIVE Final    Comment: (NOTE) Fact Sheet for Patients: bloggercourse.com  Fact Sheet for Healthcare Providers: seriousbroker.it  This test is not yet approved or cleared by the [place]  FDA and has been authorized for detection and/or diagnosis of SARS-CoV-2 by FDA under an Emergency Use Authorization (EUA). This EUA will remain in effect (meaning this test can be used) for the duration of the COVID-19 declaration under Section 564(b)(1) of the Act, 21 U.S.C. section 360bbb-3(b)(1), unless the authorization is terminated or revoked.  Performed at [facility], [address] [place], [place] [address]     Labs: CBC: Recent Labs  Lab 05/25/24 [phone] 05/26/24 1400 05/27/24 0450 05/28/24 0456 05/29/24 0724  WBC 6.8 5.6 5.9 8.3 6.0  NEUTROABS  --   --   --   --  3.2  HGB 8.4* 8.4* 8.4* 9.1* 7.7*  HCT 26.2* 25.4* 25.8* 27.1* 23.6*  MCV 104.4* 102.4* 102.8* 101.5* 102.6*  PLT 184 164 171  211 149*   Basic Metabolic Panel: Recent Labs  Lab 05/24/24 0700 05/25/24 0513 05/26/24 1400 05/27/24 0450 05/28/24 0456 05/29/24 0724  NA 136 134* 136 131* 130* 133*  K 5.2* 5.0 4.8 4.4 4.4 4.7  CL 99 97* 99 94* 92* 97*  CO2 29 29 28 29 25 28   GLUCOSE 95 97 91 86 87 100*  BUN 33* 22 34* 23 29* 35*  CREATININE 3.82* 2.84* 3.68* 3.15* 3.73* 4.00*  CALCIUM  9.2 9.3 8.5* 8.6* 9.2 8.8*  PHOS 4.2 4.0 4.2 3.9  --  4.3   Liver Function Tests: Recent Labs  Lab 05/24/24 0700 05/25/24 0513 05/26/24 1400 05/27/24 0450 05/29/24 0724  ALBUMIN  4.0 4.0 3.9 3.9 3.6    Discharge time spent: greater than 30 minutes.  Signed: Leita Blanch, MD [facility] 05/29/2024 "

## 2024-05-29 NOTE — Plan of Care (Signed)

## 2024-05-30 NOTE — Progress Notes (Signed)
 05/30/2024 = 4 min

## 2024-06-03 ENCOUNTER — Emergency Department

## 2024-06-03 ENCOUNTER — Other Ambulatory Visit: Payer: Self-pay

## 2024-06-03 ENCOUNTER — Observation Stay
Admission: EM | Admit: 2024-06-03 | Discharge: 2024-06-05 | Disposition: A | Discharge status: Home Health | Attending: Student in an Organized Health Care Education/Training Program | Admitting: Student in an Organized Health Care Education/Training Program

## 2024-06-03 DIAGNOSIS — I480 Paroxysmal atrial fibrillation: Secondary | ICD-10-CM | POA: Insufficient documentation

## 2024-06-03 DIAGNOSIS — R7989 Other specified abnormal findings of blood chemistry: Secondary | ICD-10-CM | POA: Insufficient documentation

## 2024-06-03 DIAGNOSIS — E43 Unspecified severe protein-calorie malnutrition: Secondary | ICD-10-CM | POA: Diagnosis not present

## 2024-06-03 DIAGNOSIS — N400 Enlarged prostate without lower urinary tract symptoms: Secondary | ICD-10-CM | POA: Diagnosis not present

## 2024-06-03 DIAGNOSIS — Z85828 Personal history of other malignant neoplasm of skin: Secondary | ICD-10-CM | POA: Insufficient documentation

## 2024-06-03 DIAGNOSIS — E1122 Type 2 diabetes mellitus with diabetic chronic kidney disease: Secondary | ICD-10-CM | POA: Diagnosis not present

## 2024-06-03 DIAGNOSIS — Z7901 Long term (current) use of anticoagulants: Secondary | ICD-10-CM | POA: Insufficient documentation

## 2024-06-03 DIAGNOSIS — R112 Nausea with vomiting, unspecified: Secondary | ICD-10-CM | POA: Diagnosis not present

## 2024-06-03 DIAGNOSIS — R079 Chest pain, unspecified: Secondary | ICD-10-CM | POA: Diagnosis not present

## 2024-06-03 DIAGNOSIS — Z87891 Personal history of nicotine dependence: Secondary | ICD-10-CM | POA: Insufficient documentation

## 2024-06-03 DIAGNOSIS — R0602 Shortness of breath: Secondary | ICD-10-CM | POA: Insufficient documentation

## 2024-06-03 DIAGNOSIS — R1084 Generalized abdominal pain: Secondary | ICD-10-CM | POA: Diagnosis not present

## 2024-06-03 DIAGNOSIS — D500 Iron deficiency anemia secondary to blood loss (chronic): Secondary | ICD-10-CM | POA: Diagnosis not present

## 2024-06-03 DIAGNOSIS — Z681 Body mass index (BMI) 19 or less, adult: Secondary | ICD-10-CM | POA: Insufficient documentation

## 2024-06-03 DIAGNOSIS — I120 Hypertensive chronic kidney disease with stage 5 chronic kidney disease or end stage renal disease: Secondary | ICD-10-CM | POA: Diagnosis not present

## 2024-06-03 DIAGNOSIS — J449 Chronic obstructive pulmonary disease, unspecified: Secondary | ICD-10-CM | POA: Diagnosis not present

## 2024-06-03 DIAGNOSIS — F32A Depression, unspecified: Secondary | ICD-10-CM | POA: Diagnosis not present

## 2024-06-03 DIAGNOSIS — Z8616 Personal history of COVID-19: Secondary | ICD-10-CM | POA: Diagnosis not present

## 2024-06-03 DIAGNOSIS — D631 Anemia in chronic kidney disease: Secondary | ICD-10-CM | POA: Insufficient documentation

## 2024-06-03 DIAGNOSIS — Z992 Dependence on renal dialysis: Secondary | ICD-10-CM | POA: Insufficient documentation

## 2024-06-03 DIAGNOSIS — E875 Hyperkalemia: Secondary | ICD-10-CM | POA: Diagnosis not present

## 2024-06-03 DIAGNOSIS — R0789 Other chest pain: Principal | ICD-10-CM

## 2024-06-03 DIAGNOSIS — N186 End stage renal disease: Secondary | ICD-10-CM | POA: Insufficient documentation

## 2024-06-03 DIAGNOSIS — Z950 Presence of cardiac pacemaker: Secondary | ICD-10-CM | POA: Insufficient documentation

## 2024-06-03 DIAGNOSIS — Z79899 Other long term (current) drug therapy: Secondary | ICD-10-CM | POA: Insufficient documentation

## 2024-06-03 DIAGNOSIS — G8929 Other chronic pain: Secondary | ICD-10-CM | POA: Diagnosis not present

## 2024-06-03 LAB — BASIC METABOLIC PANEL WITH GFR
Anion gap: 12 (ref 5–15)
BUN: 56 mg/dL — ABNORMAL HIGH (ref 8–23)
CO2: 27 mmol/L (ref 22–32)
Calcium: 8.9 mg/dL (ref 8.9–10.3)
Chloride: 93 mmol/L — ABNORMAL LOW (ref 98–111)
Creatinine, Ser: 5.27 mg/dL — ABNORMAL HIGH (ref 0.61–1.24)
GFR, Estimated: 10 mL/min — ABNORMAL LOW
Glucose, Bld: 98 mg/dL (ref 70–99)
Potassium: 5.8 mmol/L — ABNORMAL HIGH (ref 3.5–5.1)
Sodium: 131 mmol/L — ABNORMAL LOW (ref 135–145)

## 2024-06-03 LAB — PROTIME-INR
INR: 1 (ref 0.8–1.2)
Prothrombin Time: 14 s (ref 11.4–15.2)

## 2024-06-03 LAB — PRO BRAIN NATRIURETIC PEPTIDE: Pro Brain Natriuretic Peptide: 2136 pg/mL — ABNORMAL HIGH

## 2024-06-03 LAB — CBC
HCT: 25.9 % — ABNORMAL LOW (ref 39.0–52.0)
Hemoglobin: 8.4 g/dL — ABNORMAL LOW (ref 13.0–17.0)
MCH: 33.7 pg (ref 26.0–34.0)
MCHC: 32.4 g/dL (ref 30.0–36.0)
MCV: 104 fL — ABNORMAL HIGH (ref 80.0–100.0)
Platelets: 167 10*3/uL (ref 150–400)
RBC: 2.49 MIL/uL — ABNORMAL LOW (ref 4.22–5.81)
RDW: 13 % (ref 11.5–15.5)
WBC: 6.1 10*3/uL (ref 4.0–10.5)
nRBC: 0 % (ref 0.0–0.2)

## 2024-06-03 LAB — TROPONIN T, HIGH SENSITIVITY
Troponin T High Sensitivity: 232 ng/L (ref 0–19)
Troponin T High Sensitivity: 201 ng/L (ref 0–19)

## 2024-06-03 MED ORDER — OXYCODONE HCL ER 10 MG PO T12A
10.0000 mg | EXTENDED_RELEASE_TABLET | Freq: Three times a day (TID) | ORAL | Status: DC
  Administered 2024-06-03 – 2024-06-05 (×5): 10 mg via ORAL
  Filled 2024-06-03 (×5): qty 1

## 2024-06-03 MED ORDER — SENNOSIDES-DOCUSATE SODIUM 8.6-50 MG PO TABS
1.0000 | ORAL_TABLET | Freq: Two times a day (BID) | ORAL | Status: DC
  Administered 2024-06-03: 1 via ORAL
  Filled 2024-06-03: qty 1

## 2024-06-03 MED ORDER — FENTANYL CITRATE (PF) 50 MCG/ML IJ SOSY
12.5000 ug | PREFILLED_SYRINGE | INTRAMUSCULAR | Status: DC | PRN
  Administered 2024-06-05: 12.5 ug via INTRAVENOUS
  Filled 2024-06-03: qty 1

## 2024-06-03 MED ORDER — PROMETHAZINE HCL 25 MG PO TABS
12.5000 mg | ORAL_TABLET | Freq: Four times a day (QID) | ORAL | Status: DC | PRN
  Administered 2024-06-04 (×2): 12.5 mg via ORAL
  Filled 2024-06-03 (×2): qty 1

## 2024-06-03 MED ORDER — VITAMIN B-12 1000 MCG PO TABS
2500.0000 ug | ORAL_TABLET | Freq: Every day | ORAL | Status: DC
  Administered 2024-06-04 – 2024-06-05 (×2): 2500 ug via ORAL
  Filled 2024-06-03: qty 3
  Filled 2024-06-03: qty 2.5

## 2024-06-03 MED ORDER — MORPHINE SULFATE (PF) 2 MG/ML IV SOLN: 1.0000 mg | INTRAVENOUS | Status: DC | PRN

## 2024-06-03 MED ORDER — TRAZODONE HCL 50 MG PO TABS
25.0000 mg | ORAL_TABLET | Freq: Every evening | ORAL | Status: DC | PRN
  Administered 2024-06-04: 25 mg via ORAL
  Filled 2024-06-03: qty 1

## 2024-06-03 MED ORDER — PANTOPRAZOLE SODIUM 40 MG PO TBEC
40.0000 mg | DELAYED_RELEASE_TABLET | Freq: Every day | ORAL | Status: DC
  Administered 2024-06-03 – 2024-06-05 (×3): 40 mg via ORAL
  Filled 2024-06-03 (×3): qty 1

## 2024-06-03 MED ORDER — ONDANSETRON HCL 4 MG/2ML IJ SOLN
4.0000 mg | Freq: Once | INTRAMUSCULAR | Status: AC
  Filled 2024-06-03: qty 2

## 2024-06-03 MED ORDER — NITROGLYCERIN 0.4 MG SL SUBL: 0.4000 mg | SUBLINGUAL_TABLET | SUBLINGUAL | Status: DC | PRN

## 2024-06-03 MED ORDER — OXYCODONE-ACETAMINOPHEN 5-325 MG PO TABS: 1.0000 | ORAL_TABLET | Freq: Every day | ORAL | Status: DC | PRN

## 2024-06-03 MED ORDER — TAMSULOSIN HCL 0.4 MG PO CAPS
0.4000 mg | ORAL_CAPSULE | Freq: Every day | ORAL | Status: DC
  Administered 2024-06-03 – 2024-06-05 (×3): 0.4 mg via ORAL
  Filled 2024-06-03 (×3): qty 1

## 2024-06-03 MED ORDER — ALUM & MAG HYDROXIDE-SIMETH 200-200-20 MG/5ML PO SUSP
30.0000 mL | Freq: Once | ORAL | Status: AC
  Administered 2024-06-03: 30 mL via ORAL
  Filled 2024-06-03: qty 30

## 2024-06-03 MED ORDER — OXYCODONE HCL 5 MG PO TABS
5.0000 mg | ORAL_TABLET | Freq: Every day | ORAL | Status: DC | PRN
  Administered 2024-06-04 – 2024-06-05 (×2): 5 mg via ORAL
  Filled 2024-06-03: qty 1

## 2024-06-03 MED ORDER — OXYCODONE-ACETAMINOPHEN 10-325 MG PO TABS: 1.0000 | ORAL_TABLET | Freq: Every day | ORAL | Status: DC | PRN

## 2024-06-03 MED ORDER — HYDRALAZINE HCL 20 MG/ML IJ SOLN: 5.0000 mg | INTRAMUSCULAR | Status: DC | PRN

## 2024-06-03 MED ORDER — ACETAMINOPHEN 325 MG PO TABS
650.0000 mg | ORAL_TABLET | Freq: Four times a day (QID) | ORAL | Status: DC | PRN
  Administered 2024-06-05: 650 mg via ORAL

## 2024-06-03 MED ORDER — POLYETHYLENE GLYCOL 3350 17 G PO PACK: 17.0000 g | PACK | Freq: Every day | ORAL | Status: DC | PRN

## 2024-06-03 MED ORDER — FLUTICASONE FUROATE-VILANTEROL 200-25 MCG/ACT IN AEPB
1.0000 | INHALATION_SPRAY | Freq: Every day | RESPIRATORY_TRACT | Status: DC
  Filled 2024-06-03: qty 28

## 2024-06-03 MED ORDER — SODIUM ZIRCONIUM CYCLOSILICATE 10 G PO PACK
10.0000 g | PACK | Freq: Once | ORAL | Status: AC
  Administered 2024-06-03: 10 g via ORAL
  Filled 2024-06-03: qty 1

## 2024-06-03 MED ORDER — ONDANSETRON HCL 4 MG/2ML IJ SOLN
INTRAMUSCULAR | Status: AC
  Administered 2024-06-03: 4 mg via INTRAVENOUS
  Filled 2024-06-03: qty 2

## 2024-06-03 MED ORDER — DICYCLOMINE HCL 10 MG PO CAPS
10.0000 mg | ORAL_CAPSULE | Freq: Three times a day (TID) | ORAL | Status: DC
  Administered 2024-06-04 – 2024-06-05 (×3): 10 mg via ORAL
  Filled 2024-06-03 (×5): qty 1

## 2024-06-03 MED ORDER — ATORVASTATIN CALCIUM 20 MG PO TABS
40.0000 mg | ORAL_TABLET | Freq: Every day | ORAL | Status: DC
  Administered 2024-06-03 – 2024-06-05 (×3): 40 mg via ORAL
  Filled 2024-06-03 (×3): qty 2

## 2024-06-03 MED ORDER — AMIODARONE HCL 200 MG PO TABS
100.0000 mg | ORAL_TABLET | Freq: Two times a day (BID) | ORAL | Status: DC
  Administered 2024-06-03 – 2024-06-05 (×4): 100 mg via ORAL
  Filled 2024-06-03 (×4): qty 1

## 2024-06-03 MED ORDER — LIDOCAINE VISCOUS HCL 2 % MT SOLN
15.0000 mL | Freq: Once | OROMUCOSAL | Status: AC
  Administered 2024-06-03: 15 mL via ORAL
  Filled 2024-06-03: qty 15

## 2024-06-03 MED ORDER — HEPARIN (PORCINE) 25000 UT/250ML-% IV SOLN
900.0000 [IU]/h | INTRAVENOUS | Status: DC
  Administered 2024-06-04: 600 [IU]/h via INTRAVENOUS
  Administered 2024-06-05: 900 [IU]/h via INTRAVENOUS
  Filled 2024-06-03 (×2): qty 250

## 2024-06-03 MED ORDER — METOCLOPRAMIDE HCL 5 MG/ML IJ SOLN
5.0000 mg | Freq: Three times a day (TID) | INTRAMUSCULAR | Status: DC
  Administered 2024-06-03 – 2024-06-05 (×5): 5 mg via INTRAVENOUS
  Filled 2024-06-03 (×5): qty 2

## 2024-06-03 MED ORDER — IPRATROPIUM BROMIDE 0.02 % IN SOLN
0.5000 mg | Freq: Two times a day (BID) | RESPIRATORY_TRACT | Status: DC
  Administered 2024-06-03 – 2024-06-05 (×4): 0.5 mg via RESPIRATORY_TRACT
  Filled 2024-06-03 (×4): qty 2.5

## 2024-06-03 MED ORDER — ALBUTEROL SULFATE (2.5 MG/3ML) 0.083% IN NEBU: 3.0000 mL | INHALATION_SOLUTION | RESPIRATORY_TRACT | Status: DC | PRN

## 2024-06-03 MED ORDER — ESCITALOPRAM OXALATE 10 MG PO TABS
5.0000 mg | ORAL_TABLET | Freq: Every day | ORAL | Status: DC
  Administered 2024-06-03 – 2024-06-05 (×3): 5 mg via ORAL
  Filled 2024-06-03 (×3): qty 1

## 2024-06-03 MED ORDER — NEPRO/CARBSTEADY PO LIQD
237.0000 mL | Freq: Three times a day (TID) | ORAL | Status: DC
  Administered 2024-06-03 – 2024-06-04 (×2): 237 mL via ORAL

## 2024-06-03 MED ORDER — DM-GUAIFENESIN ER 30-600 MG PO TB12: 1.0000 | ORAL_TABLET | Freq: Two times a day (BID) | ORAL | Status: DC | PRN

## 2024-06-03 MED ORDER — ONDANSETRON HCL 4 MG/2ML IJ SOLN: 4.0000 mg | Freq: Three times a day (TID) | INTRAMUSCULAR | Status: DC | PRN

## 2024-06-03 MED ORDER — TORSEMIDE 20 MG PO TABS
40.0000 mg | ORAL_TABLET | ORAL | Status: DC
  Administered 2024-06-03 – 2024-06-05 (×2): 40 mg via ORAL
  Filled 2024-06-03 (×2): qty 2

## 2024-06-03 NOTE — H&P (Signed)
 " History and Physical    Riley Martin FMW:[id number] DOB: May 07, 1946 DOA: 06/03/2024  Referring MD/NP/PA:   PCP: Alla Amis, MD   Patient coming from:  The patient is coming from home.     Chief Complaint: chest pain, nausea, vomiting, abdominal pain  HPI: Riley Martin is a 79 y.o. male with medical history significant of ESRD-HD (TTS), A fib on Eliquis , HTN, DM, COPD on 2 L oxygen,, s/p of PPM, depression, BPH, anemia, chronic pain, MGUS, GI bleeding, who presents with chest pain, nausea, vomiting, abdominal pain.  Patient was recently hospitalized from 1/14 - 1/22 due to melena. Pt had negative EGD, and colonoscopy showed a single bleeding colonic angiodysplastic lesion, treated with argon plasma coagulation (APC). His Eliquis  was resumed. No more dark stool or rectal bleeding.   Patient states that he has chronic abdominal pain and continues to have nausea and multiple episodes of nonbilious nonbloody vomiting today.  His abdominal pain is located in the upper abdomen, intermittent, aching, moderate, nonradiating.  Not aggravated or alleviated by any known factors.  He states that after vomiting, he developed chest pain, which is located in the front chest, dull, moderate, intermittent, nonradiating.  Not aggravated or alleviated by known factors.  Not associated with SOB or cough.  Patient does not have fever or chills. Patient reports mild burning and dysuria on urination.  No urinary frequency or hematuria.   Patient states that his last dialysis was 5 days ago.  He missed her dialysis today.  Data reviewed independently and ED Course: pt was found to have troponin 232 --> 201, WBC 6.1, potassium 5.8, bicarbonate 27, creatinine 5.27, BUN 56, GFR 10. Negative UA. Temperature normal, blood pressure 106/56, heart rate 82, RR 20, oxygen saturation 94% on room air.  Chest x-ray showed COPD without infiltration.  CT of abdomen/pelvis is negative for acute intra-abdominal  issues.  Patient is placed in telemetry bed for observation. Consulted Dr. Wilburn of cardiology and Dr. Marcelino of renal.   CT abdomen/pelvis: 1. No evidence of bowel obstruction. 2. Moderate stool burden, without obstruction. 3. Moderate bilateral renal cortical atrophy, in keeping with chronic renal insufficiency . 4. Nonobstructing 4 mm left lower pole renal calculus, without hydronephrosis or ureteral calculus. 5. Moderate sigmoid diverticulosis, without diverticulitis. 6. Moderate aortoiliac atherosclerotic calcification. 7. Moderate lumbar dextroscoliosis centered at L3. 8. Stable circumscribed lytic lesion in the left femoral head-neck junction with dense central calcification, most consistent with an intraosseous lipoma. 9. Partially visualized leadless pacer at the right ventricular apex, new since prior examination .   EKG: I have personally reviewed.  Paced rhythm.   Review of Systems:   General: no fevers, chills, no body weight gain, has poor appetite, has fatigue HEENT: no blurry vision, hearing changes or sore throat Respiratory: no dyspnea, coughing, wheezing CV: has chest pain, no palpitations GI: has nausea, vomiting, abdominal pain, no diarrhea GU: has dysuria, burning on urination, no increased urinary frequency, hematuria  Ext: no leg edema Neuro: no unilateral weakness, numbness, or tingling, no vision change or hearing loss Skin: no rash, no skin tear. MSK: No muscle spasm, no deformity, no limitation of range of movement in spin Heme: No easy bruising.  Travel history: No recent long distant travel.   Allergy: Allergies[1]  Past Medical History:  Diagnosis Date   A-fib [facility])    Arthritis    BPH (benign prostatic hyperplasia)    Cancer (HCC)    skin cancer   Chronic pain  COPD (chronic obstructive pulmonary disease) (HCC)    COVID    GERD (gastroesophageal reflux disease)    Headache    History of insomnia    IBS (irritable bowel syndrome)     Iron  deficiency anemia due to chronic blood loss 07/12/2021   Orthostatic dizziness    PONV (postoperative nausea and vomiting)    Spinal stenosis    Torn rotator cuff    left    Past Surgical History:  Procedure Laterality Date   COLONOSCOPY     COLONOSCOPY N/A 05/28/2024   Procedure: COLONOSCOPY;  Surgeon: Jinny Carmine, MD;  Location: [facility] ENDOSCOPY;  Service: Endoscopy;  Laterality: N/A;   COLONOSCOPY  05/28/2024   Procedure: COLONOSCOPY, WITH ARGON PLASMA COAGULATION;  Surgeon: Jinny Carmine, MD;  Location: [facility] ENDOSCOPY;  Service: Endoscopy;;   COLONOSCOPY WITH PROPOFOL  N/A 05/13/2021   Procedure: COLONOSCOPY WITH PROPOFOL ;  Surgeon: Therisa Bi, MD;  Location: [facility] ENDOSCOPY;  Service: Gastroenterology;  Laterality: N/A;   DIALYSIS/PERMA CATHETER INSERTION N/A 05/23/2024   Procedure: DIALYSIS/PERMA CATHETER INSERTION;  Surgeon: Marea Selinda RAMAN, MD;  Location: [facility] INVASIVE CV LAB;  Service: Cardiovascular;  Laterality: N/A;   ESOPHAGOGASTRODUODENOSCOPY N/A 03/14/2021   Procedure: ESOPHAGOGASTRODUODENOSCOPY (EGD);  Surgeon: Onita Elspeth Sharper, DO;  Location: [facility] ENDOSCOPY;  Service: Gastroenterology;  Laterality: N/A;   ESOPHAGOGASTRODUODENOSCOPY N/A 05/13/2021   Procedure: ESOPHAGOGASTRODUODENOSCOPY (EGD);  Surgeon: Therisa Bi, MD;  Location: [facility] ENDOSCOPY;  Service: Gastroenterology;  Laterality: N/A;   ESOPHAGOGASTRODUODENOSCOPY N/A 10/10/2023   Procedure: EGD (ESOPHAGOGASTRODUODENOSCOPY);  Surgeon: Toledo, Ladell POUR, MD;  Location: [facility] ENDOSCOPY;  Service: Gastroenterology;  Laterality: N/A;  Eliquis    ESOPHAGOGASTRODUODENOSCOPY N/A 05/28/2024   Procedure: EGD (ESOPHAGOGASTRODUODENOSCOPY);  Surgeon: Jinny Carmine, MD;  Location: [facility] ENDOSCOPY;  Service: Endoscopy;  Laterality: N/A;   EXCISION CHONCHA BULLOSA Bilateral 06/02/2015   Procedure: BILATERAL CHONCHA BULLOSA;  Surgeon: Carolee Hunter, MD;  Location: [facility]GERY [facility];  Service: ENT;  Laterality: Bilateral;   HERNIA  REPAIR     MAXILLARY ANTROSTOMY Bilateral 06/02/2015   Procedure: MAXILLARY ANTROSTOMY;  Surgeon: Carolee Hunter, MD;  Location: [facility];  Service: ENT;  Laterality: Bilateral;   NASAL POLYP SURGERY     NECK SURGERY  05/08/2006   no limitations   PACEMAKER LEADLESS INSERTION N/A 12/06/2023   Procedure: PACEMAKER LEADLESS INSERTION;  Surgeon: Ammon Blunt, MD;  Location: [facility] INVASIVE CV LAB;  Service: Cardiovascular;  Laterality: N/A;   UPPER GASTROINTESTINAL ENDOSCOPY      Social History:  reports that he quit smoking about 6 years ago. His smoking use included cigarettes. He started smoking about 63 years ago. He has a 57 pack-year smoking history. He has never used smokeless tobacco. He reports that he does not drink alcohol and does not use drugs.  Family History:  Family History  Problem Relation Age of Onset   Heart attack Brother      Prior to Admission medications  Medication Sig Start Date End Date Taking? Authorizing Provider  albuterol  (PROVENTIL ) (2.5 MG/3ML) 0.083% nebulizer solution Take 3 mLs by nebulization every 6 (six) hours as needed for wheezing or shortness of breath. 10/08/22 05/22/24  Viviann Pastor, MD  amiodarone  (PACERONE ) 200 MG tablet Take 1 tablet (200 mg total) by mouth daily. Reduced from twice daily.  Home med. 12/07/23   Awanda City, MD  apixaban  (ELIQUIS ) 2.5 MG TABS tablet Take by mouth. 06/02/21   [provider]  budesonide  (PULMICORT ) 1 MG/2ML nebulizer solution Take 1 mg by nebulization daily.    [provider]  Cyanocobalamin  (B-12) 2500 MCG SUBL Place 1 tablet under the tongue daily. 10/12/21   Babara Call, MD  dicyclomine  (BENTYL ) 10 MG capsule Take 10 mg by mouth 3 (three) times daily before meals. 04/30/24 04/30/25  [provider]  erythromycin ophthalmic ointment Place 1 Application into both eyes at bedtime.    [provider]  escitalopram  (LEXAPRO ) 5 MG tablet Take 5 mg by mouth daily.     [provider]  fluticasone  (FLONASE ) 50 MCG/ACT nasal spray SPRAY 2 SPRAYS INTO EACH NOSTRIL EVERY DAY 02/07/17   Verdia Art, MD  ipratropium (ATROVENT ) 0.02 % nebulizer solution Take 0.5 mg by nebulization 2 (two) times daily.    [provider]  ipratropium-albuterol  (DUONEB) 0.5-2.5 (3) MG/3ML SOLN Inhale 3 mLs into the lungs every 6 (six) hours as needed. 12/28/23   [provider]  mometasone -formoterol  (DULERA ) 200-5 MCG/ACT AERO Inhale 2 puffs into the lungs 2 (two) times daily. 12/29/20   Fausto Burnard LABOR, DO  Multiple Vitamin (MULTIVITAMIN WITH MINERALS) TABS tablet Take 1 tablet by mouth daily. 12/30/20   Fausto Burnard A, DO  naloxone  (NARCAN ) nasal spray 4 mg/0.1 mL Place 0.4 mg into the nose once. CALL 911. SPR CONTENTS OF ONE SPRAYER (0.1ML) INTO ONE NOSTRIL. REPEAT IN 2-3 MIN IF SYMPTOMS OF OPIOID EMERGENCY PERSIST, ALTERNATE NOSTRILS 11/22/2021 11/22/21   [provider]  Nutritional Supplements (FEEDING SUPPLEMENT, NEPRO CARB STEADY,) LIQD Take 237 mLs by mouth 3 (three) times daily between meals. 12/29/20   Fausto Burnard LABOR, DO  omeprazole (PRILOSEC) 40 MG capsule Take 40 mg by mouth 2 (two) times daily.    [provider]  ondansetron  (ZOFRAN -ODT) 4 MG disintegrating tablet Take 1 tablet (4 mg total) by mouth every 8 (eight) hours as needed for nausea or vomiting. 07/04/23   Babara Call, MD  oxyCODONE -acetaminophen  (PERCOCET) 10-325 MG tablet Take 1 tablet by mouth 5 (five) times daily as needed. 06/24/21   [provider]  OXYCONTIN  10 MG 12 hr tablet Take 10 mg by mouth 3 (three) times daily. 12/22/20   [provider]  OXYGEN Inhale 2 L into the lungs at bedtime as needed.    [provider]  sodium chloride  (OCEAN) 0.65 % SOLN nasal spray Place 1 spray into both nostrils as needed for congestion. 12/29/20   Fausto Burnard LABOR, DO  tamsulosin  (FLOMAX ) 0.4 MG CAPS capsule Take 0.4 mg by mouth daily. 09/03/16    [provider]  torsemide  (DEMADEX ) 20 MG tablet Take 2 tablets (40 mg) by mouth once daily on Sunday, Monday, Wednesday, and Friday (Non-dialysis days). 05/29/24   Patel, Sona, MD  traZODone  (DESYREL ) 50 MG tablet Take 25 mg by mouth at bedtime as needed.    [provider]  VENTOLIN  HFA 108 (90 Base) MCG/ACT inhaler Inhale 2 puffs into the lungs every 6 (six) hours as needed. 08/07/16   [provider]    Physical Exam: Vitals:   06/03/24 1910 06/03/24 1915 06/03/24 2118 06/04/24 0125  BP: 138/61  (!) 124/58 (!) 128/54  Pulse:   76 84  Resp:   (!) 25 15  Temp:  98.4 F (36.9 C) 98.5 F (36.9 C) 98.6 F (37 C)  TempSrc:  Oral Oral Oral  SpO2:   99% 98%  Weight:      Height:       General: Not in acute distress HEENT:       Eyes: PERRL, EOMI, no jaundice  ENT: No discharge from the ears and nose, no pharynx injection, no tonsillar enlargement.        Neck: No JVD, no bruit, no mass felt. Heme: No neck lymph node enlargement. Cardiac: S1/S2, RRR, No gallops or rubs. Respiratory: No rales, wheezing, rhonchi or rubs. GI: Soft, nondistended, has tenderness in upper abdomen, no rebound pain, no organomegaly, BS present. GU: No hematuria Ext: No pitting leg edema bilaterally. 1+DP/PT pulse bilaterally. Musculoskeletal: No joint deformities, No joint redness or warmth, no limitation of ROM in spin. Skin: No rashes.  Neuro: Alert, oriented X3, cranial nerves II-XII grossly intact, moves all extremities normally.  Psych: Patient is not psychotic, no suicidal or hemocidal ideation.  Labs on Admission: I have personally reviewed following labs and imaging studies  CBC: Recent Labs  Lab 05/28/24 0456 05/29/24 0724 06/03/24 1504  WBC 8.3 6.0 6.1  NEUTROABS  --  3.2  --   HGB 9.1* 7.7* 8.4*  HCT 27.1* 23.6* 25.9*  MCV 101.5* 102.6* 104.0*  PLT 211 149* 167   Basic Metabolic Panel: Recent Labs  Lab 05/28/24 0456 05/29/24 0724 06/03/24 1504  NA  130* 133* 131*  K 4.4 4.7 5.8*  CL 92* 97* 93*  CO2 25 28 27   GLUCOSE 87 100* 98  BUN 29* 35* 56*  CREATININE 3.73* 4.00* 5.27*  CALCIUM  9.2 8.8* 8.9  PHOS  --  4.3  --    GFR: Estimated Creatinine Clearance: 7.7 mL/min (A) (by C-G formula based on SCr of 5.27 mg/dL (H)). Liver Function Tests: Recent Labs  Lab 05/29/24 0724  ALBUMIN  3.6   No results for input(s): LIPASE, AMYLASE in the last 168 hours. No results for input(s): AMMONIA in the last 168 hours. Coagulation Profile: Recent Labs  Lab 06/03/24 1504  INR 1.0   Cardiac Enzymes: No results for input(s): CKTOTAL, CKMB, CKMBINDEX, TROPONINI in the last 168 hours. BNP (last 3 results) Recent Labs    04/09/24 1059 06/03/24 1504  PROBNP 656.0* 2,136.0*   HbA1C: No results for input(s): HGBA1C in the last 72 hours. CBG: No results for input(s): GLUCAP in the last 168 hours. Lipid Profile: No results for input(s): CHOL, HDL, LDLCALC, TRIG, CHOLHDL, LDLDIRECT in the last 72 hours. Thyroid  Function Tests: No results for input(s): TSH, T4TOTAL, FREET4, T3FREE, THYROIDAB in the last 72 hours. Anemia Panel: No results for input(s): VITAMINB12, FOLATE, FERRITIN, TIBC, IRON , RETICCTPCT in the last 72 hours. Urine analysis:    Component Value Date/Time   COLORURINE STRAW (A) 06/03/2024 2348   APPEARANCEUR CLEAR (A) 06/03/2024 2348   LABSPEC 1.006 06/03/2024 2348   PHURINE 7.0 06/03/2024 2348   GLUCOSEU NEGATIVE 06/03/2024 2348   HGBUR NEGATIVE 06/03/2024 2348   BILIRUBINUR NEGATIVE 06/03/2024 2348   KETONESUR NEGATIVE 06/03/2024 2348   PROTEINUR NEGATIVE 06/03/2024 2348   NITRITE NEGATIVE 06/03/2024 2348   LEUKOCYTESUR NEGATIVE 06/03/2024 2348   Sepsis Labs: @LABRCNTIP (procalcitonin:4,lacticidven:4) )No results found for this or any previous visit (from the past 240 hours).   Radiological Exams on Admission:   Assessment/Plan Principal Problem:   Chest  pain Active Problems:   ESRD on dialysis (HCC)   Hyperkalemia   Nausea and vomiting   COPD (chronic obstructive pulmonary disease) (HCC)   Iron  deficiency anemia due to chronic blood loss   Paroxysmal atrial fibrillation (HCC)   BPH (benign prostatic hyperplasia)   Chronic pain   Depression   Protein-calorie malnutrition, severe   Assessment and Plan:  Chest pain: trop 232 --> 201.  Possible  non-STEMI.  Consulted Dr. Wilburn of card.   - place to tele bed for observation - switch Eliquis  to IV heparin  - Trend Trop - prn fentanyl  for severe pain - prn Nitroglycerin  - Patient received 324 mg of aspirin  before arrival - Statin: Start Lipitor 40 mg daily - Risk factor stratification: will check FLP and A1C   ESRD on dialysis (TTS): Last dialysis was 5 days ago. -Continue home torsemide  - Consulted Dr. Marcelino of renal   Hyperkalemia: K 5.8 -will 10 g of Lokelma    Nausea and vomiting and chronic abdominal pain: Etiology is not clear.  CT of abdomen/pelvis negative for acute intra-abdominal issues. -Check lipase and liver function - Pain control as above - As needed Zofran  and scheduled Reglan  5 mg 3 times daily  COPD (chronic obstructive pulmonary disease) (HCC): Stable -Bronchodilators and prn Mucinex   Iron  deficiency anemia due to chronic blood loss: Hemoglobin 8.4 (7.7 on 05/29/2024) Follow-up repeat CBC  Paroxysmal atrial fibrillation (HCC): Heart rate 80s -Switched Eliquis  to IV heparin  as above - Continue amiodarone   BPH (benign prostatic hyperplasia) - Flomax   Chronic pain -Continue home OxyContin  and Percocet  Depression -Lexapro  and trazodone   Protein-calorie malnutrition, severe: Body weight 48.5 kg, BMI 14.51 - Nutrition supplement    DVT ppx: On IV Heparin      Code Status: Full code     Family Communication:   Yes, patient's wife at bed side.   Disposition Plan:  Anticipate discharge back to previous environment  Consults called: Consulted  Dr. Wilburn of cardiology and Dr. Marcelino of renal.    Admission status and Level of care: Telemetry:    for obs    Dispo: The patient is from: Home              Anticipated d/c is to: Home              Anticipated d/c date is: 1 day              Patient currently is not medically stable to d/c.    Severity of Illness:  The appropriate patient status for this patient is OBSERVATION. Observation status is judged to be reasonable and necessary in order to provide the required intensity of service to ensure the patient's safety. The patient's presenting symptoms, physical exam findings, and initial radiographic and laboratory data in the context of their medical condition is felt to place them at decreased risk for further clinical deterioration. Furthermore, it is anticipated that the patient will be medically stable for discharge from the hospital within 2 midnights of admission.        Date of Service 06/04/2024    Joli Koob [facility]   If 7PM-7AM, please contact night-coverage [email or web address] 06/04/2024, 2:20 AM     [1]  Allergies Allergen Reactions   Iodinated Contrast Media Other (See Comments) and Nausea Only    Dry heaves, patient states he felt like he was on fire and about to pass out.  Pre-syncope, burning  Dry heaves, patient states he felt like he was on fire and about to pass out.    Lisinopril  Swelling    Tongue swelling, neck pain and Headaches    Tramadol Other (See Comments) and Shortness Of Breath    Other reaction(s): Other (See Comments) Other Reaction: tachy Dizziness and unsteady gait.   Pregabalin Palpitations    Other reaction(s): Other (See Comments) Other Reaction: edema   "

## 2024-06-03 NOTE — ED Triage Notes (Signed)
 Pt arrives via EMS from home c/o vomiting and chest pain. Pt has ESRD and not had dialysis x5 days. Pt took 324mg  ASA PTA. Pt has not taken eliquis  or amiodarone  today. Pt has pacemaker. Pt normally goes to dialysis [facility]. Pt went this morning and was unable to run pt. Pt reports the chest pain came after he started vomiting.

## 2024-06-03 NOTE — ED Provider Notes (Signed)
 [name] Provider Note    Event Date/Time   First MD Initiated Contact with Patient 06/03/24 1601     (approximate)   History   Chest Pain   HPI  Riley Martin is a 79 y.o. 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  States that he has not had dialysis for 5 days.  Went to dialysis morning but they were unable to do dialysis on patient they are so backed up, asked him to come back at 2 PM but states that he has been having nausea vomiting and was unable to get back to dialysis.  States that he has not had a bowel movement since he was discharged from the hospital.  Has an intermittent cough.  Does note abdominal pain this he says is not new.  States that chest pain started after vomiting.  Feels like burning but does not feel like his typical acid reflux.  Denies any shortness of breath.  No fever.  Per independent history from EMS, he took 325 mg of aspirin  prior to arrival.  Did not take his Eliquis  or amiodarone  today.  Does a history of a pacemaker.  On independent review, he was admitted in January of this year for symptomatic anemia, noted to have intractable nausea vomiting and decreased p.o. intake as well as shortness of breath with dark schools for 10 days.  Had a CT that showed retained stool.  Was noted to have an AKI on CKD, had a tunneled catheter placed and done on HD.  His creatinine that was improving to 3.15.  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   Physical Exam   Triage Vital Signs: ED Triage Vitals  Encounter Vitals Group     BP  06/03/24 1503 (!) 106/56     Girls Systolic BP Percentile --      Girls Diastolic BP Percentile --      Boys Systolic BP Percentile --      Boys Diastolic BP Percentile --      Pulse Rate 06/03/24 1503 82     Resp 06/03/24 1503 20     Temp 06/03/24 1509 98.5 F (36.9 C)     Temp Source 06/03/24 1509 Oral     SpO2 06/03/24 1503 94 %     Weight 06/03/24 1501 107 lb (48.5 kg)     Height 06/03/24 1501 6' (1.829 m)     Head Circumference --      Peak Flow --      Pain Score 06/03/24 1501 8     Pain Loc --      Pain Education --      Exclude from Growth Chart --     Most recent vital signs: Vitals:   06/03/24 1910 06/03/24 1915  BP: 138/61   Pulse:    Resp:    Temp:  98.4 F (36.9 C)  SpO2:       General: Awake, no distress.  CV:  Good peripheral perfusion.  Resp:  Normal effort.  No tachypnea or respiratory distress, clear Abd:  No distention.  Soft, mild diffuse  tenderness without guarding Other:  He has a right tunneled cath.   ED Results / Procedures / Treatments   Labs (all labs ordered are listed, but only abnormal results are displayed) Labs Reviewed  BASIC METABOLIC PANEL WITH GFR - Abnormal; Notable for the following components:      Result Value   Sodium 131 (*)    Potassium 5.8 (*)    Chloride 93 (*)    BUN 56 (*)    Creatinine, Ser 5.27 (*)    GFR, Estimated 10 (*)    All other components within normal limits  CBC - Abnormal; Notable for the following components:   RBC 2.49 (*)    Hemoglobin 8.4 (*)    HCT 25.9 (*)    MCV 104.0 (*)    All other components within normal limits  PRO BRAIN NATRIURETIC PEPTIDE - Abnormal; Notable for the following components:   Pro Brain Natriuretic Peptide 2,136.0 (*)    All other components within normal limits  TROPONIN T, HIGH SENSITIVITY - Abnormal; Notable for the following components:   Troponin T High Sensitivity 232 (*)    All other components within normal limits  PROTIME-INR  TROPONIN T, HIGH SENSITIVITY      EKG  EKG shows, V paced rhythm, rate 60s, widened QRS, normal QTc, does not meet Sgarbossa's criteria, there are some artifact due to movement, this is changed compared to prior   RADIOLOGY On my independent interpretation, the obvious consolidation   PROCEDURES:  Critical Care performed: No  Procedures   MEDICATIONS ORDERED IN ED: Medications  sodium zirconium cyclosilicate  (LOKELMA ) packet 10 g (has no administration in time range)  polyethylene glycol (MIRALAX  / GLYCOLAX ) packet 17 g (has no administration in time range)  senna-docusate (Senokot-S) tablet 1 tablet (has no administration in time range)  alum & mag hydroxide-simeth (MAALOX/MYLANTA) 200-200-20 MG/5ML suspension 30 mL (30 mLs Oral Given 06/03/24 1701)    And  lidocaine  (XYLOCAINE ) 2 % viscous mouth solution 15 mL (15 mLs Oral Given 06/03/24 1701)  ondansetron  (ZOFRAN ) injection 4 mg (4 mg Intravenous Given 06/03/24 1908)     IMPRESSION / MDM / ASSESSMENT AND PLAN / ED COURSE  I reviewed the triage vital signs and the nursing notes.                              Differential diagnosis includes, but is not limited to, volume overload, CHF exacerbation, electrolyte derangements, ACS, angina, GERD, acid reflux.  Did consider constipation, SBO.  labs, EKG, troponin, chest x-ray.  GI cocktail, CT.  Will likely need admission.  Patient's presentation is most consistent with acute presentation with potential threat to life or bodily function.  Independent interpretation of labs and imaging below.  CT without obvious obstruction.  Given some Lokelma  here.  Given his nausea vomiting, chest pain with elevated troponin, having to be admitted for further management.  Will also likely need dialysis during admission since he did not get his dialysis today.  Consult to hospitalist.  He is admitted.    Clinical Course as of 06/03/24 1946  Tue Jun 03, 2024  1605 DG Chest 2 View IMPRESSION: 1. Hyperinflation and  interstitial thickening, likely related to COPD/chronic bronchitis. No acute superimposed process. 2. Aortic Atherosclerosis (ICD10-I70.0).   [TT]  1605 Independent review of labs, BNP and troponin are elevated, she is mildly hyponatremic, mildly hyperkalemic, creatinine is elevated but she history of ESRD on dialysis, no  leukocytosis. [TT]  1941 CT ABDOMEN PELVIS WO CONTRAST IMPRESSION: 1. No evidence of bowel obstruction. 2. Moderate stool burden, without obstruction. 3. Moderate bilateral renal cortical atrophy, in keeping with chronic renal insufficiency . 4. Nonobstructing 4 mm left lower pole renal calculus, without hydronephrosis or ureteral calculus. 5. Moderate sigmoid diverticulosis, without diverticulitis. 6. Moderate aortoiliac atherosclerotic calcification. 7. Moderate lumbar dextroscoliosis centered at L3. 8. Stable circumscribed lytic lesion in the left femoral head-neck junction with dense central calcification, most consistent with an intraosseous lipoma. 9. Partially visualized leadless pacer at the right ventricular apex, new since prior examination .   [TT]  1941 Put in for some Lokelma  for him [TT]    Clinical Course User Index [TT] Waymond Lorelle Cummins, MD     FINAL CLINICAL IMPRESSION(S) / ED DIAGNOSES   Final diagnoses:  Burning chest pain  Nausea and vomiting, unspecified vomiting type  Generalized abdominal pain  ESRD (end stage renal disease) on dialysis (HCC)  Elevated troponin     Rx / DC Orders   ED Discharge Orders     None        Note:  This document was prepared using Dragon voice recognition software and may include unintentional dictation errors.    Waymond Lorelle Cummins, MD 06/03/24 [phone]

## 2024-06-03 NOTE — ED Notes (Signed)
 Dr Clarine notified of trop 232. Orders to be placed as needed.

## 2024-06-03 NOTE — Progress Notes (Signed)
 PHARMACY - ANTICOAGULATION CONSULT NOTE  Pharmacy Consult for Heparin   Indication: chest pain/ACS, AFib   Allergies[1]  Patient Measurements: Height: 6' (182.9 cm) Weight: 48.5 kg (107 lb) IBW/kg (Calculated) : 77.6 HEPARIN  DW (KG): 48.5  Vital Signs: Temp: 98.5 F (36.9 C) (01/27 2118) Temp Source: Oral (01/27 2118) BP: 124/58 (01/27 2118) Pulse Rate: 76 (01/27 2118)  Labs: Recent Labs    06/03/24 1504  HGB 8.4*  HCT 25.9*  PLT 167  LABPROT 14.0  INR 1.0  CREATININE 5.27*    Estimated Creatinine Clearance: 7.7 mL/min (A) (by C-G formula based on SCr of 5.27 mg/dL (H)).   Medical History: Past Medical History:  Diagnosis Date   A-fib (HCC)    Arthritis    BPH (benign prostatic hyperplasia)    Cancer (HCC)    skin cancer   Chronic pain    COPD (chronic obstructive pulmonary disease) (HCC)    COVID    GERD (gastroesophageal reflux disease)    Headache    History of insomnia    IBS (irritable bowel syndrome)    Iron  deficiency anemia due to chronic blood loss 07/12/2021   Orthostatic dizziness    PONV (postoperative nausea and vomiting)    Spinal stenosis    Torn rotator cuff    left    Medications:  (Not in a hospital admission)   Assessment: Pharmacy consulted to dose heparin  in this 79 year old male admitted with ACS/NSTEMI.   Pt was on Eliquis  2.5 mg PO BID PTA, last dose on 1/26 PM.  CrCl = 7.7 ml/min   Goal of Therapy:  Heparin  level 0.3-0.7 units/ml aPTT 66 - 102 seconds Monitor platelets by anticoagulation protocol: Yes   Plan:  - Heparin  infusion to start @ 600 units/hr,  no bolus b/c recent Eliquis  use. - Will use aPTT to guide dosing until correlating with HL  - Will draw aPTT, HL 8 hrs after start of drip - CBC daily   Deztiny Sarra D 06/03/2024,11:12 PM      [1]  Allergies Allergen Reactions   Iodinated Contrast Media Other (See Comments) and Nausea Only    Dry heaves, patient states he felt like he was on fire and about  to pass out.  Pre-syncope, burning  Dry heaves, patient states he felt like he was on fire and about to pass out.    Lisinopril  Swelling    Tongue swelling, neck pain and Headaches    Tramadol Other (See Comments) and Shortness Of Breath    Other reaction(s): Other (See Comments) Other Reaction: tachy Dizziness and unsteady gait.   Pregabalin Palpitations    Other reaction(s): Other (See Comments) Other Reaction: edema

## 2024-06-04 ENCOUNTER — Observation Stay

## 2024-06-04 ENCOUNTER — Observation Stay: Admit: 2024-06-04 | Discharge: 2024-06-04 | Disposition: A | Attending: Student

## 2024-06-04 ENCOUNTER — Encounter: Payer: Self-pay | Admitting: Internal Medicine

## 2024-06-04 ENCOUNTER — Other Ambulatory Visit (HOSPITAL_COMMUNITY)

## 2024-06-04 DIAGNOSIS — N186 End stage renal disease: Secondary | ICD-10-CM | POA: Diagnosis not present

## 2024-06-04 DIAGNOSIS — R072 Precordial pain: Secondary | ICD-10-CM

## 2024-06-04 LAB — NM MYOCAR MULTI W/SPECT W/WALL MOTION / EF
Estimated workload: 1
Exercise duration (min): 1 min
Exercise duration (sec): 0 s
LV dias vol: 93 mL (ref 62–150)
LV sys vol: 13 mL
MPHR: 140 {beats}/min
Nuc Stress EF: 77 %
Peak HR: 83 {beats}/min
Percent HR: 59 %
Rest HR: 68 {beats}/min
Rest Nuclear Isotope Dose: 10.9 mCi
SDS: 2
SRS: 15
SSS: 13
ST Depression (mm): 0 mm
Stress Nuclear Isotope Dose: 32.9 mCi
TID: 0.93

## 2024-06-04 LAB — HEPARIN LEVEL (UNFRACTIONATED)
Heparin Unfractionated: >1.1 [IU]/mL — ABNORMAL HIGH (ref 0.30–0.70)
Heparin Unfractionated: >1.1 [IU]/mL — ABNORMAL HIGH (ref 0.30–0.70)

## 2024-06-04 LAB — BASIC METABOLIC PANEL WITH GFR
Anion gap: 13 (ref 5–15)
BUN: 57 mg/dL — ABNORMAL HIGH (ref 8–23)
CO2: 26 mmol/L (ref 22–32)
Calcium: 8.6 mg/dL — ABNORMAL LOW (ref 8.9–10.3)
Chloride: 94 mmol/L — ABNORMAL LOW (ref 98–111)
Creatinine, Ser: 5.6 mg/dL — ABNORMAL HIGH (ref 0.61–1.24)
GFR, Estimated: 10 mL/min — ABNORMAL LOW
Glucose, Bld: 94 mg/dL (ref 70–99)
Potassium: 4.9 mmol/L (ref 3.5–5.1)
Sodium: 133 mmol/L — ABNORMAL LOW (ref 135–145)

## 2024-06-04 LAB — CBC
HCT: 25 % — ABNORMAL LOW (ref 39.0–52.0)
Hemoglobin: 8.1 g/dL — ABNORMAL LOW (ref 13.0–17.0)
MCH: 33.5 pg (ref 26.0–34.0)
MCHC: 32.4 g/dL (ref 30.0–36.0)
MCV: 103.3 fL — ABNORMAL HIGH (ref 80.0–100.0)
Platelets: 168 10*3/uL (ref 150–400)
RBC: 2.42 MIL/uL — ABNORMAL LOW (ref 4.22–5.81)
RDW: 12.8 % (ref 11.5–15.5)
WBC: 5.2 10*3/uL (ref 4.0–10.5)
nRBC: 0 % (ref 0.0–0.2)

## 2024-06-04 LAB — URINALYSIS, COMPLETE (UACMP) WITH MICROSCOPIC
Bacteria, UA: NONE SEEN
Bilirubin Urine: NEGATIVE
Glucose, UA: NEGATIVE mg/dL
Hgb urine dipstick: NEGATIVE
Ketones, ur: NEGATIVE mg/dL
Leukocytes,Ua: NEGATIVE
Nitrite: NEGATIVE
Protein, ur: NEGATIVE mg/dL
Specific Gravity, Urine: 1.006 (ref 1.005–1.030)
Squamous Epithelial / HPF: 0 /HPF (ref 0–5)
pH: 7 (ref 5.0–8.0)

## 2024-06-04 LAB — HEPATIC FUNCTION PANEL
ALT: 6 U/L (ref 0–44)
AST: 12 U/L — ABNORMAL LOW (ref 15–41)
Albumin: 3.9 g/dL (ref 3.5–5.0)
Alkaline Phosphatase: 65 U/L (ref 38–126)
Bilirubin, Direct: 0.1 mg/dL (ref 0.0–0.2)
Indirect Bilirubin: 0.1 mg/dL — ABNORMAL LOW (ref 0.3–0.9)
Total Bilirubin: 0.2 mg/dL (ref 0.0–1.2)
Total Protein: 6.2 g/dL — ABNORMAL LOW (ref 6.5–8.1)

## 2024-06-04 LAB — APTT
aPTT: 35 s (ref 24–36)
aPTT: 36 s (ref 24–36)
aPTT: 42 s — ABNORMAL HIGH (ref 24–36)

## 2024-06-04 LAB — TROPONIN T, HIGH SENSITIVITY
Troponin T High Sensitivity: 176 ng/L (ref 0–19)
Troponin T High Sensitivity: 173 ng/L (ref 0–19)

## 2024-06-04 LAB — ECHOCARDIOGRAM COMPLETE
Height: 72 in
S' Lateral: 2.5 cm
Weight: 1712 [oz_av]

## 2024-06-04 LAB — LIPASE, BLOOD: Lipase: 11 U/L (ref 11–51)

## 2024-06-04 MED ORDER — REGADENOSON 0.4 MG/5ML IV SOLN
0.4000 mg | Freq: Once | INTRAVENOUS | Status: AC
  Administered 2024-06-04: 0.4 mg via INTRAVENOUS

## 2024-06-04 MED ORDER — HEPARIN BOLUS VIA INFUSION
1500.0000 [IU] | Freq: Once | INTRAVENOUS | Status: AC
  Administered 2024-06-04: 1500 [IU] via INTRAVENOUS
  Filled 2024-06-04: qty 1500

## 2024-06-04 MED ORDER — CHLORHEXIDINE GLUCONATE CLOTH 2 % EX PADS
6.0000 | MEDICATED_PAD | Freq: Every day | CUTANEOUS | Status: DC
  Filled 2024-06-04: qty 6

## 2024-06-04 MED ORDER — HEPARIN BOLUS VIA INFUSION
1450.0000 [IU] | Freq: Once | INTRAVENOUS | Status: AC
  Administered 2024-06-04: 1450 [IU] via INTRAVENOUS
  Filled 2024-06-04: qty 1450

## 2024-06-04 MED ORDER — TECHNETIUM TC 99M TETROFOSMIN IV KIT
10.9300 | PACK | Freq: Once | INTRAVENOUS | Status: AC | PRN
  Administered 2024-06-04: 10.93 via INTRAVENOUS

## 2024-06-04 MED ORDER — POLYETHYLENE GLYCOL 3350 17 G PO PACK
17.0000 g | PACK | Freq: Two times a day (BID) | ORAL | Status: DC
  Administered 2024-06-04: 17 g via ORAL
  Filled 2024-06-04 (×2): qty 1

## 2024-06-04 MED ORDER — TECHNETIUM TC 99M TETROFOSMIN IV KIT
32.9200 | PACK | Freq: Once | INTRAVENOUS | Status: AC | PRN
  Administered 2024-06-04: 32.92 via INTRAVENOUS

## 2024-06-04 NOTE — Procedures (Signed)
 I came by to get his echo at 8:35am and he was in stress test----I secure chatted [facility] and she said it would be fine to get it later if possible.

## 2024-06-04 NOTE — ED Notes (Signed)
 Pt given meal

## 2024-06-04 NOTE — ED Notes (Signed)
Pt going for stress test at this time  

## 2024-06-04 NOTE — Progress Notes (Signed)
 PHARMACY - ANTICOAGULATION CONSULT NOTE  Pharmacy Consult for Heparin   Indication: chest pain/ACS, AFib   Allergies[1]  Patient Measurements: Height: 6' (182.9 cm) Weight: 48.5 kg (107 lb) IBW/kg (Calculated) : 77.6 HEPARIN  DW (KG): 48.5  Vital Signs: Temp: 98.2 F (36.8 C) (01/28 1111) Temp Source: Oral (01/28 1111) BP: 120/56 (01/28 1111) Pulse Rate: 73 (01/28 1111)  Labs: Recent Labs    06/03/24 1504 06/03/24 2348 06/04/24 0419 06/04/24 1049  HGB 8.4*  --  8.1*  --   HCT 25.9*  --  25.0*  --   PLT 167  --  168  --   APTT  --  35  --  36  LABPROT 14.0  --   --   --   INR 1.0  --   --   --   HEPARINUNFRC  --  >1.10*  --  >1.10*  CREATININE 5.27*  --  5.60*  --     Estimated Creatinine Clearance: 7.2 mL/min (A) (by C-G formula based on SCr of 5.6 mg/dL (H)).   Medical History: Past Medical History:  Diagnosis Date   A-fib (HCC)    Arthritis    BPH (benign prostatic hyperplasia)    Cancer (HCC)    skin cancer   Chronic pain    COPD (chronic obstructive pulmonary disease) (HCC)    COVID    GERD (gastroesophageal reflux disease)    Headache    History of insomnia    IBS (irritable bowel syndrome)    Iron  deficiency anemia due to chronic blood loss 07/12/2021   Orthostatic dizziness    PONV (postoperative nausea and vomiting)    Spinal stenosis    Torn rotator cuff    left    Medications:  (Not in a hospital admission)   Assessment: Pharmacy consulted to dose heparin  in this 79 year old male admitted with ACS/NSTEMI.   Pt was on Eliquis  2.5 mg PO BID PTA, last dose on 1/26 PM.  CrCl = 7.7 ml/min   1/28 1049: aPTT 36, HL >1.1  Goal of Therapy:  Heparin  level 0.3-0.7 units/ml aPTT 66 - 102 seconds Monitor platelets by anticoagulation protocol: Yes   Plan:  - aPTT subtherapeutic, HL not correlating yet - Give 1500 unit bolus x 1 - Increase heparin  infusion rate to 750 units/hr,  no bolus b/c recent Eliquis  use. - Will use aPTT to guide dosing  until correlating with HL  - Will draw aPTT, HL 8 hrs after start of drip - CBC daily   Lum VEAR Mania, PharmD, BCPS 06/04/2024,11:40 AM       [1]  Allergies Allergen Reactions   Iodinated Contrast Media Other (See Comments) and Nausea Only    Dry heaves, patient states he felt like he was on fire and about to pass out.  Pre-syncope, burning  Dry heaves, patient states he felt like he was on fire and about to pass out.    Lisinopril  Swelling    Tongue swelling, neck pain and Headaches    Tramadol Other (See Comments) and Shortness Of Breath    Other reaction(s): Other (See Comments) Other Reaction: tachy Dizziness and unsteady gait.   Pregabalin Palpitations    Other reaction(s): Other (See Comments) Other Reaction: edema

## 2024-06-04 NOTE — ED Notes (Signed)
 PT still in stress test at this time

## 2024-06-04 NOTE — Progress Notes (Signed)
 " PROGRESS NOTE Riley Martin    DOB: Sep 29, 1945, 79 y.o.  FMW:[id number]    Code Status: Full Code   DOA: 06/03/2024   LOS: 0  Brief hospital course  Riley Martin is a 79 y.o. 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  Patient states that his last dialysis was 5 days ago.  He missed her dialysis today.   ED Course: troponin 232 --> 201, WBC 6.1, potassium 5.8, bicarbonate 27, creatinine 5.27, BUN 56, GFR 10. Negative UA. Temperature normal, blood pressure 106/56, heart rate 82, RR 20, oxygen saturation 94% on room air.  Chest x-ray showed COPD without infiltration.  CT of abdomen/pelvis is negative for acute intra-abdominal issues.     CT abdomen/pelvis: 1. No evidence of bowel obstruction. 2. Moderate stool burden, without obstruction. 3. Moderate bilateral renal cortical atrophy, in keeping with chronic renal insufficiency . 4. Nonobstructing 4 mm left lower pole renal calculus, without hydronephrosis or ureteral calculus. 5. Moderate sigmoid diverticulosis, without diverticulitis. 6. Moderate aortoiliac atherosclerotic calcification. 7. Moderate lumbar dextroscoliosis centered at L3. 8. Stable circumscribed lytic lesion in the left femoral head-neck junction with dense central calcification, most consistent with an intraosseous lipoma. 9. Partially visualized leadless pacer at the right ventricular apex, new since prior examination .  06/04/24 -cardiology consulted, nephrology consulted.  Assessment & Plan  Principal Problem:   Chest pain Active Problems:   ESRD on dialysis (HCC)   Hyperkalemia   Nausea and vomiting   COPD (chronic obstructive pulmonary disease) (HCC)   Iron  deficiency anemia due to chronic blood loss   Paroxysmal atrial fibrillation (HCC)   BPH (benign prostatic  hyperplasia)   Chronic pain   Depression   Protein-calorie malnutrition, severe  Chest pain: trop 232 --> 201.  Possible non-STEMI.  - f/u consult with cards to r/o ACS - remains on heparin  gtt and has no chest pain currently.  - stress test and echo today.  - prn fentanyl  for severe pain - prn Nitroglycerin  - Patient received 324 mg of aspirin  before arrival - Statin: Start Lipitor 40 mg daily   ESRD on dialysis (TTS): Last dialysis was 5 days ago with issues for his HD center with the weather. -Continue home torsemide  - Consulted Dr. Marcelino of renal. Will start back on HD tomorrow   Hyperkalemia: improved. K+ 5.8>4.9 -s/p 10 g of Lokelma     Nausea and vomiting and chronic abdominal pain: Etiology is not clear.  CT of abdomen/pelvis negative for acute intra-abdominal issues. - Pain control as above - As needed Zofran  and scheduled Reglan  5 mg 3 times daily   COPD (chronic obstructive pulmonary disease) (HCC): Stable -Bronchodilators and prn Mucinex    Iron  deficiency anemia due to chronic blood loss: Hemoglobin 8.4 (7.7 on 05/29/2024) Follow-up repeat CBC   Paroxysmal atrial fibrillation (HCC): Heart rate 80s -Switched Eliquis  to IV heparin  as above - Continue amiodarone    BPH (benign prostatic hyperplasia) - Flomax    Chronic pain -Continue home OxyContin  and Percocet   Depression -Lexapro  and trazodone    Protein-calorie malnutrition, severe: Body weight 48.5 kg, BMI 14.51 - Nutrition supplement  Body mass index is 14.51 kg/m.  VTE ppx: heparin  gtt  Diet:     Diet   Diet NPO time specified Except for: Sips with Meds, Ice Chips   Consultants:  Cardiology Nephrology   Subjective 06/04/24    Pt reports feeling improved. Chest pain has resolved. Has some mild nausea but feels that he is hungry and can eat.    Objective  Blood pressure (!) 122/56, pulse 72, temperature 98.4 F (36.9 C), temperature source Oral, resp. rate 12, height 6' (1.829 m), weight  48.5 kg, SpO2 100%. No intake or output data in the 24 hours ending 06/04/24 0823 Filed Weights   06/03/24 1501  Weight: 48.5 kg    Physical Exam: General: awake, alert, NAD HEENT: atraumatic, clear conjunctiva, anicteric sclera, MMM, hearing grossly normal Respiratory: normal respiratory effort. Cardiovascular: extremities well perfused, quick capillary refill, normal S1/S2, RRR, no JVD, murmurs. R tunneled cath in place Nervous: A&O x3. no gross focal neurologic deficits, normal speech Extremities: moves all equally, no edema, normal tone Skin: dry, intact, normal temperature, normal color. No rashes, lesions or ulcers on exposed skin Psychiatry: normal mood, congruent affect  Labs   I have personally reviewed the following labs and imaging studies CBC    Component Value Date/Time   WBC 5.2 06/04/2024 0419   RBC 2.42 (L) 06/04/2024 0419   HGB 8.1 (L) 06/04/2024 0419   HGB 8.7 (L) 04/09/2024 1058   HGB 12.7 (L) 06/14/2012 0656   HCT 25.0 (L) 06/04/2024 0419   HCT 38.5 (L) 06/14/2012 0656   PLT 168 06/04/2024 0419   PLT 207 04/09/2024 1058   PLT 290 06/14/2012 0656   MCV 103.3 (H) 06/04/2024 0419   MCV 97 06/14/2012 0656   MCH 33.5 06/04/2024 0419   MCHC 32.4 06/04/2024 0419   RDW 12.8 06/04/2024 0419   RDW 12.9 06/14/2012 0656   LYMPHSABS 1.7 05/29/2024 0724   MONOABS 0.5 05/29/2024 0724   EOSABS 0.5 05/29/2024 0724   BASOSABS 0.0 05/29/2024 0724      Latest Ref Rng & Units 06/04/2024    4:19 AM 06/03/2024    3:04 PM 05/29/2024    7:24 AM  BMP  Glucose 70 - 99 mg/dL 94  98  899   BUN 8 - 23 mg/dL 57  56  35   Creatinine 0.61 - 1.24 mg/dL 4.39  4.72  5.99   Sodium 135 - 145 mmol/L 133  131  133   Potassium 3.5 - 5.1 mmol/L 4.9  5.8  4.7   Chloride 98 - 111 mmol/L 94  93  97   CO2 22 - 32 mmol/L 26  27  28    Calcium  8.9 - 10.3 mg/dL 8.6  8.9  8.8     CT ABDOMEN PELVIS WO CONTRAST Result Date: 06/03/2024 EXAM: CT ABDOMEN AND PELVIS WITHOUT CONTRAST 06/03/2024  06:40:50 PM TECHNIQUE: CT of the abdomen and pelvis was performed without the administration of intravenous contrast. Multiplanar reformatted images are provided for review. Automated exposure control, iterative reconstruction, and/or weight-based adjustment of the mA/kV was utilized to reduce the radiation dose to as low as reasonably achievable. COMPARISON: 05/21/2024 and 02/22/2024. CLINICAL HISTORY: Bowel obstruction. Vomiting, chest pain. 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 FINDINGS: LOWER CHEST: New leadless pacer within the right ventricular apex, partially visualized. LIVER: The liver is unremarkable. GALLBLADDER AND BILE DUCTS: Gallbladder is unremarkable. No biliary ductal dilatation. SPLEEN: No acute abnormality. PANCREAS: No acute abnormality. ADRENAL GLANDS: No acute abnormality. KIDNEYS, URETERS AND BLADDER: Stable 4 mm nonobstructing calculus within the lower pole of the left kidney. No ureteral calculi. No hydronephrosis. No perinephric inflammatory stranding or fluid collections were identified. The bladder  is unremarkable. Moderate bilateral renal cortical atrophy. GI AND BOWEL: Moderate sigmoid diverticulosis. Appendix normal. The stomach, small bowel, and large bowel are otherwise unremarkable. Moderate stool burden without evidence of obstruction. PERITONEUM AND RETROPERITONEUM: No ascites. No free air. VASCULATURE: Aorta is normal in caliber. Moderate aortoiliac atherosclerotic calcification. LYMPH NODES: No lymphadenopathy. REPRODUCTIVE ORGANS: No acute abnormality. BONES AND SOFT TISSUES: Moderate lumbar dextroscoliosis, apex to the right at L3. Osseous structures are age appropriate. No acute bone abnormality. No suspicious lytic or blastic bone lesion. 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 No focal soft tissue abnormality. IMPRESSION: 1. No  evidence of bowel obstruction. 2. Moderate stool burden, without obstruction. 3. Moderate bilateral renal cortical atrophy, in keeping with chronic renal insufficiency . 4. Nonobstructing 4 mm left lower pole renal calculus, without hydronephrosis or ureteral calculus. 5. Moderate sigmoid diverticulosis, without diverticulitis. 6. Moderate aortoiliac atherosclerotic calcification. 7. Moderate lumbar dextroscoliosis centered at L3. 8. Stable circumscribed lytic lesion in the left femoral head-neck junction with dense central calcification, most consistent with an intraosseous lipoma. 9. Partially visualized leadless pacer at the right ventricular apex, new since prior examination . Electronically signed by: Dorethia Molt MD 06/03/2024 07:23 PM EST RP Workstation: [id number]   DG Chest 2 View Result Date: 06/03/2024 CLINICAL DATA:  Chest pain EXAM: CHEST - 2 VIEW COMPARISON:  05/22/2024 FINDINGS: Hyperinflation. A mild pectus excavatum deformity. Double-lumen right central line terminates at the low SVC and superior caval/atrial junction. Midline trachea. Normal heart size. Atherosclerosis in the transverse aorta. No pleural effusion or pneumothorax. Left-greater-than-right biapical pleuroparenchymal scarring. Lower lung predominant interstitial thickening is mild. No lobar consolidation. IMPRESSION: 1. Hyperinflation and interstitial thickening, likely related to COPD/chronic bronchitis. No acute superimposed process. 2. Aortic Atherosclerosis (ICD10-I70.0). Electronically Signed   By: Rockey Kilts M.D.   On: 06/03/2024 16:01   Disposition Plan & Communication  Patient status: Observation  Admitted From: Home Planned disposition location: Home Anticipated discharge date: 1/29 pending HD  Family Communication: wife at bedside    Author: Marien LITTIE Piety, DO [facility] 06/04/2024, 8:23 AM   Available by Epic secure chat 7AM-7PM. If 7PM-7AM, please contact night-coverage.  [facility] contact  information found on [email or web address].  "

## 2024-06-04 NOTE — Progress Notes (Signed)
*  PRELIMINARY RESULTS* Echocardiogram 2D Echocardiogram has been performed.  Floydene Harder 06/04/2024, 11:45 AM

## 2024-06-04 NOTE — Progress Notes (Signed)
 " [facility]  ROUNDING NOTE   Subjective:   Riley Martin is a 79 year old male with past medical conditions including COPD, BPH, atrial fibrillation on Eliquis , IBS-C, pacemaker, anemia, and chronic kidney disease stage IV.  Patient presents to the emergency department for vomiting and chest pain and has been admitted under observation for Chest pain [R07.9]  During a recent admission, patient was initiated on hemodialysis. He currently receives dialysis at [facility] on a TTIS schedule, was scheduled to start treatment on Tuesday however did not due to weather delay. States he began having chest pain after he started vomiting. Wife states he was well over the weekend. Continues to have weakness but appetite was improved.   Labs on ED arrival show potassium 5.8, BUN 56, creatinine 5.27 with GFR 10, BNP greater than 2100, troponin 232, and hemoglobin 8.4.  Urine appears clear.  Chest x-ray shows COPD/chronic bronchitis.  CT abdomen pelvis showed no evidence of bowel obstruction however moderate stool burden, and a 4 mm nonobstructive left renal stone.  We have been consulted to manage dialysis needs during this admission.  Objective:  Vital signs in last 24 hours:  Temp:  [97.9 F (36.6 C)-98.6 F (37 C)] 98.3 F (36.8 C) (01/28 1400) Pulse Rate:  [69-84] 80 (01/28 1400) Resp:  [12-25] 20 (01/28 1400) BP: (119-138)/(54-61) 119/57 (01/28 1400) SpO2:  [94 %-100 %] 100 % (01/28 1400)  Weight change:  Filed Weights   06/03/24 1501  Weight: 48.5 kg    Intake/Output: No intake/output data recorded.   Intake/Output this shift:  Total I/O In: 48.2 [I.V.:48.2] Out: -   Physical Exam: General: NAD  Head: Normocephalic, atraumatic. Moist oral mucosal membranes  Eyes: Anicteric  Lungs:  Clear to auscultation, normal effort  Heart: Regular rate and rhythm  Abdomen:  Soft, nontender  Extremities:  No peripheral edema.  Neurologic: Awake, alert, conversant   Skin: Warm,dry, no rash  Access: Rt internal jugular permcath    Basic Metabolic Panel: Recent Labs  Lab 05/29/24 0724 06/03/24 1504 06/04/24 0419  NA 133* 131* 133*  K 4.7 5.8* 4.9  CL 97* 93* 94*  CO2 28 27 26   GLUCOSE 100* 98 94  BUN 35* 56* 57*  CREATININE 4.00* 5.27* 5.60*  CALCIUM  8.8* 8.9 8.6*  PHOS 4.3  --   --     Liver Function Tests: Recent Labs  Lab 05/29/24 0724 06/04/24 0419  AST  --  12*  ALT  --  6  ALKPHOS  --  65  BILITOT  --  0.2  PROT  --  6.2*  ALBUMIN  3.6 3.9   Recent Labs  Lab 06/04/24 0419  LIPASE 11   No results for input(s): AMMONIA in the last 168 hours.  CBC: Recent Labs  Lab 05/29/24 0724 06/03/24 1504 06/04/24 0419  WBC 6.0 6.1 5.2  NEUTROABS 3.2  --   --   HGB 7.7* 8.4* 8.1*  HCT 23.6* 25.9* 25.0*  MCV 102.6* 104.0* 103.3*  PLT 149* 167 168    Cardiac Enzymes: No results for input(s): CKTOTAL, CKMB, CKMBINDEX, TROPONINI in the last 168 hours.  BNP: Invalid input(s): POCBNP  CBG: No results for input(s): GLUCAP in the last 168 hours.  Microbiology: Results for orders placed or performed during the hospital encounter of 02/21/24  Resp panel by RT-PCR (RSV, Flu A&B, Covid) Anterior Nasal Swab     Status: None   Collection Time: 02/22/24 12:32 AM   Specimen: Anterior Nasal Swab  Result Value Ref Range Status   SARS Coronavirus 2 by RT PCR NEGATIVE NEGATIVE Final    Comment: (NOTE) SARS-CoV-2 target nucleic acids are NOT DETECTED.  The SARS-CoV-2 RNA is generally detectable in upper respiratory specimens during the acute phase of infection. The lowest concentration of SARS-CoV-2 viral copies this assay can detect is 138 copies/mL. A negative result does not preclude SARS-Cov-2 infection and should not be used as the sole basis for treatment or other patient management decisions. A negative result may occur with  improper specimen collection/handling, submission of specimen other than  nasopharyngeal swab, presence of viral mutation(s) within the areas targeted by this assay, and inadequate number of viral copies(<138 copies/mL). A negative result must be combined with clinical observations, patient history, and epidemiological information. The expected result is Negative.  Fact Sheet for Patients:  bloggercourse.com  Fact Sheet for Healthcare Providers:  seriousbroker.it  This test is no t yet approved or cleared by the [place]  FDA and  has been authorized for detection and/or diagnosis of SARS-CoV-2 by FDA under an Emergency Use Authorization (EUA). This EUA will remain  in effect (meaning this test can be used) for the duration of the COVID-19 declaration under Section 564(b)(1) of the Act, 21 U.S.C.section 360bbb-3(b)(1), unless the authorization is terminated  or revoked sooner.       Influenza A by PCR NEGATIVE NEGATIVE Final   Influenza B by PCR NEGATIVE NEGATIVE Final    Comment: (NOTE) The Xpert Xpress SARS-CoV-2/FLU/RSV plus assay is intended as an aid in the diagnosis of influenza from Nasopharyngeal swab specimens and should not be used as a sole basis for treatment. Nasal washings and aspirates are unacceptable for Xpert Xpress SARS-CoV-2/FLU/RSV testing.  Fact Sheet for Patients: bloggercourse.com  Fact Sheet for Healthcare Providers: seriousbroker.it  This test is not yet approved or cleared by the [place]  FDA and has been authorized for detection and/or diagnosis of SARS-CoV-2 by FDA under an Emergency Use Authorization (EUA). This EUA will remain in effect (meaning this test can be used) for the duration of the COVID-19 declaration under Section 564(b)(1) of the Act, 21 U.S.C. section 360bbb-3(b)(1), unless the authorization is terminated or revoked.     Resp Syncytial Virus by PCR NEGATIVE NEGATIVE Final    Comment:  (NOTE) Fact Sheet for Patients: bloggercourse.com  Fact Sheet for Healthcare Providers: seriousbroker.it  This test is not yet approved or cleared by the [place]  FDA and has been authorized for detection and/or diagnosis of SARS-CoV-2 by FDA under an Emergency Use Authorization (EUA). This EUA will remain in effect (meaning this test can be used) for the duration of the COVID-19 declaration under Section 564(b)(1) of the Act, 21 U.S.C. section 360bbb-3(b)(1), unless the authorization is terminated or revoked.  Performed at [facility], [address] [place], [place] [address]     Coagulation Studies: Recent Labs    06/03/24 1504  LABPROT 14.0  INR 1.0    Urinalysis: Recent Labs    06/03/24 2348  COLORURINE STRAW*  LABSPEC 1.006  PHURINE 7.0  GLUCOSEU NEGATIVE  HGBUR NEGATIVE  BILIRUBINUR NEGATIVE  KETONESUR NEGATIVE  PROTEINUR NEGATIVE  NITRITE NEGATIVE  LEUKOCYTESUR NEGATIVE       Imaging: CT ABDOMEN PELVIS WO CONTRAST Result Date: 06/03/2024 EXAM: CT ABDOMEN AND PELVIS WITHOUT CONTRAST 06/03/2024 06:40:50 PM TECHNIQUE: CT of the abdomen and pelvis was performed without the administration of intravenous contrast. Multiplanar reformatted images are provided for review. Automated exposure control, iterative reconstruction, and/or weight-based  adjustment of the mA/kV was utilized to reduce the radiation dose to as low as reasonably achievable. COMPARISON: 05/21/2024 and 02/22/2024. CLINICAL HISTORY: Bowel obstruction. Vomiting, chest pain. 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 FINDINGS: LOWER CHEST: New leadless pacer within the right ventricular apex, partially visualized. LIVER: The liver is unremarkable. GALLBLADDER AND BILE DUCTS: Gallbladder is unremarkable. No biliary ductal dilatation. SPLEEN: No acute abnormality. PANCREAS: No acute abnormality. ADRENAL GLANDS: No acute  abnormality. KIDNEYS, URETERS AND BLADDER: Stable 4 mm nonobstructing calculus within the lower pole of the left kidney. No ureteral calculi. No hydronephrosis. No perinephric inflammatory stranding or fluid collections were identified. The bladder is unremarkable. Moderate bilateral renal cortical atrophy. GI AND BOWEL: Moderate sigmoid diverticulosis. Appendix normal. The stomach, small bowel, and large bowel are otherwise unremarkable. Moderate stool burden without evidence of obstruction. PERITONEUM AND RETROPERITONEUM: No ascites. No free air. VASCULATURE: Aorta is normal in caliber. Moderate aortoiliac atherosclerotic calcification. LYMPH NODES: No lymphadenopathy. REPRODUCTIVE ORGANS: No acute abnormality. BONES AND SOFT TISSUES: Moderate lumbar dextroscoliosis, apex to the right at L3. Osseous structures are age appropriate. No acute bone abnormality. No suspicious lytic or blastic bone lesion. 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 No focal soft tissue abnormality. IMPRESSION: 1. No evidence of bowel obstruction. 2. Moderate stool burden, without obstruction. 3. Moderate bilateral renal cortical atrophy, in keeping with chronic renal insufficiency . 4. Nonobstructing 4 mm left lower pole renal calculus, without hydronephrosis or ureteral calculus. 5. Moderate sigmoid diverticulosis, without diverticulitis. 6. Moderate aortoiliac atherosclerotic calcification. 7. Moderate lumbar dextroscoliosis centered at L3. 8. Stable circumscribed lytic lesion in the left femoral head-neck junction with dense central calcification, most consistent with an intraosseous lipoma. 9. Partially visualized leadless pacer at the right ventricular apex, new since prior examination . Electronically signed by: Dorethia Molt MD 06/03/2024 07:23 PM EST RP Workstation: [id number]   DG Chest 2 View Result  Date: 06/03/2024 CLINICAL DATA:  Chest pain EXAM: CHEST - 2 VIEW COMPARISON:  05/22/2024 FINDINGS: Hyperinflation. A mild pectus excavatum deformity. Double-lumen right central line terminates at the low SVC and superior caval/atrial junction. Midline trachea. Normal heart size. Atherosclerosis in the transverse aorta. No pleural effusion or pneumothorax. Left-greater-than-right biapical pleuroparenchymal scarring. Lower lung predominant interstitial thickening is mild. No lobar consolidation. IMPRESSION: 1. Hyperinflation and interstitial thickening, likely related to COPD/chronic bronchitis. No acute superimposed process. 2. Aortic Atherosclerosis (ICD10-I70.0). Electronically Signed   By: Rockey Kilts M.D.   On: 06/03/2024 16:01      Medications:    heparin  750 Units/hr (06/04/24 1145)    amiodarone   100 mg Oral BID   atorvastatin   40 mg Oral Daily   Chlorhexidine  Gluconate Cloth  6 each Topical Q0600   cyanocobalamin   2,500 mcg Oral Daily   dicyclomine   10 mg Oral TID AC   escitalopram   5 mg Oral Daily   feeding supplement (NEPRO CARB STEADY)  237 mL Oral TID BM   fluticasone  furoate-vilanterol  1 puff Inhalation Daily   ipratropium  0.5 mg Nebulization BID   metoCLOPramide  (REGLAN ) injection  5 mg Intravenous Q8H   oxyCODONE   10 mg Oral TID   pantoprazole   40 mg Oral Daily   polyethylene glycol  17 g Oral BID   tamsulosin   0.4 mg Oral Daily   torsemide   40 mg Oral Once per day on Sunday Tuesday Thursday Saturday   acetaminophen , albuterol , dextromethorphan -guaiFENesin , fentaNYL  (SUBLIMAZE )  injection, nitroGLYCERIN , oxyCODONE -acetaminophen  **AND** oxyCODONE , promethazine , traZODone   Assessment/ Plan:  Mr. Riley Martin is a 79 y.o.  male with past medical conditions including COPD, BPH, atrial fibrillation on Eliquis , IBS-C, pacemaker, anemia, and chronic kidney disease stage IV.  Patient presents to the emergency department for chest pain and vomiting, admitted under observation  for Chest pain [R07.9]   Acute Kidney Injury with hyperkalemia on chronic kidney disease stage IV with baseline creatinine 4.4 and GFR of 130 on 04/25/24.  Patient started on hemodialysis during recent admission.  Potassium 5.8, improved with temporizing measures.  Was scheduled to receive outpatient dialysis yesterday however unable to treat due to weather delay.  Patient was scheduled to receive dialysis today however due to volume of urgent cases and patient stability, will perform dialysis tomorrow.  Primary team has been notified.   Lab Results  Component Value Date   CREATININE 5.60 (H) 06/04/2024   CREATININE 5.27 (H) 06/03/2024   CREATININE 4.00 (H) 05/29/2024    Intake/Output Summary (Last 24 hours) at 06/04/2024 1540 Last data filed at 06/04/2024 0849 Gross per 24 hour  Intake 48.22 ml  Output --  Net 48.22 ml   2. Anemia of chronic kidney disease Lab Results  Component Value Date   HGB 8.1 (L) 06/04/2024   Hgb 8.1,  Continue low dose Retacrit  with dialysis.   3. Secondary Hyperparathyroidism: with outpatient labs: PTH 66 on 03/04/2024, phosphorus 5.1, calcium  8.6 on 05/19/24.   Lab Results  Component Value Date   CALCIUM  8.6 (L) 06/04/2024   PHOS 4.3 05/29/2024    Calcium  and phosphorus within optimal range.  Will continue to monitor as outpatient.   LOS: 0 Toni Demo 1/28/20263:40 PM   "

## 2024-06-04 NOTE — ED Notes (Signed)
Pt back from [facility] at this time.

## 2024-06-04 NOTE — Progress Notes (Incomplete)
 PHARMACY - ANTICOAGULATION CONSULT NOTE  Pharmacy Consult for Heparin   Indication: chest pain/ACS, AFib   Allergies[1]  Patient Measurements: Height: 6' (182.9 cm) Weight: 48.5 kg (107 lb) IBW/kg (Calculated) : 77.6 HEPARIN  DW (KG): 48.5  Vital Signs: Temp: 97.9 F (36.6 C) (01/28 0400) Temp Source: Oral (01/28 0125) BP: 130/60 (01/28 0400) Pulse Rate: 69 (01/28 0440)  Labs: Recent Labs    06/03/24 1504 06/03/24 2348 06/04/24 0419  HGB 8.4*  --  8.1*  HCT 25.9*  --  25.0*  PLT 167  --  168  APTT  --  35  --   LABPROT 14.0  --   --   INR 1.0  --   --   HEPARINUNFRC  --  >1.10*  --   CREATININE 5.27*  --  5.60*    Estimated Creatinine Clearance: 7.2 mL/min (A) (by C-G formula based on SCr of 5.6 mg/dL (H)).   Medical History: Past Medical History:  Diagnosis Date   A-fib (HCC)    Arthritis    BPH (benign prostatic hyperplasia)    Cancer (HCC)    skin cancer   Chronic pain    COPD (chronic obstructive pulmonary disease) (HCC)    COVID    GERD (gastroesophageal reflux disease)    Headache    History of insomnia    IBS (irritable bowel syndrome)    Iron  deficiency anemia due to chronic blood loss 07/12/2021   Orthostatic dizziness    PONV (postoperative nausea and vomiting)    Spinal stenosis    Torn rotator cuff    left    Medications:  Apixaban  2.5mg  BID (last dose 1/26 PM)   Assessment: Pharmacy consulted to dose heparin  in this 79 year old male admitted with ACS/NSTEMI.   Past medical history notable for ESRD-HD (TTS), A fib on Eliquis , anemia, and hx of GI bleeding. Pt was on Eliquis  2.5 mg PO BID PTA, last dose on 1/26 PM.  CrCl = 7.7 ml/min   1/28 Hgb 8.1 downtrending (bl 8-9), plt 168 stable   Goal of Therapy:  Heparin  level 0.3-0.7 units/ml aPTT 66 - 102 seconds Monitor platelets by anticoagulation protocol: Yes  Date   Time   aPTT   Rate/comment 1/28    Plan:  Heparin  infusion to start @ 600 units/hr,  no bolus b/c recent Eliquis   use. Will use aPTT to guide dosing until correlating with HL  Will draw aPTT, HL 8 hrs after start of drip CBC daily   Leonor JAYSON Argyle, PharmD Pharmacy Resident  06/04/2024 7:44 AM        [1]  Allergies Allergen Reactions   Iodinated Contrast Media Other (See Comments) and Nausea Only    Dry heaves, patient states he felt like he was on fire and about to pass out.  Pre-syncope, burning  Dry heaves, patient states he felt like he was on fire and about to pass out.    Lisinopril  Swelling    Tongue swelling, neck pain and Headaches    Tramadol Other (See Comments) and Shortness Of Breath    Other reaction(s): Other (See Comments) Other Reaction: tachy Dizziness and unsteady gait.   Pregabalin Palpitations    Other reaction(s): Other (See Comments) Other Reaction: edema

## 2024-06-04 NOTE — Consult Note (Signed)
 " Riley Martin       Patient ID: Riley Martin MRN: [id number] DOB/AGE: 1946-01-11 79 y.o.  Admit date: 06/03/2024 Referring Physician Dr. Caleb Exon Primary Physician Alla Amis, MD  Primary Cardiologist Dr. Wilburn Reason for Consultation chest pain  HPI: Riley Martin is a 79 y.o. 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 Cardiology was consulted for further evaluation.   Patient presented with complaints of abdominal pain, nausea and vomiting as well as chest pain. Had not had HD for 5 days. Workup in the ED notable for creatinine 5.27, potassium 5.8, hemoglobin 8.4, WBC 6.1. Troponins 232 > 201 > 176, BNP 2,136. EKG in the ED ventricular pacing rate 66 bpm. CXR without acute abnormality. CT abd/pelvis with no evidence of acute abnormality or bowel obstruction. Started on IV heparin  in the ED.  At the time of my evaluation this morning, he is resting in bed with wife at bedside. We discussed his symptoms in further detail. He states yesterday during the day he had issues with lower abdominal pain and nausea. Had a few episodes of vomiting and after this, developed chest pain. He describes this as dull, aching epigastric pain that was generalized. Has chronic SOB 2/2 COPD. Denies any exertional chest discomfort symptoms recently. Still complaining of nausea but denies CP.  Review of systems complete and found to be negative unless listed above    Past Medical History:  Diagnosis Date   A-fib (HCC)    Arthritis    BPH (benign prostatic hyperplasia)    Cancer (HCC)    skin cancer   Chronic pain    COPD (chronic obstructive pulmonary disease) (HCC)    COVID    GERD (gastroesophageal reflux disease)    Headache    History of insomnia    IBS  (irritable bowel syndrome)    Iron  deficiency anemia due to chronic blood loss 07/12/2021   Orthostatic dizziness    PONV (postoperative nausea and vomiting)    Spinal stenosis    Torn rotator cuff    left    Past Surgical History:  Procedure Laterality Date   COLONOSCOPY     COLONOSCOPY N/A 05/28/2024   Procedure: COLONOSCOPY;  Surgeon: Jinny Carmine, MD;  Location: [facility] ENDOSCOPY;  Service: Endoscopy;  Laterality: N/A;   COLONOSCOPY  05/28/2024   Procedure: COLONOSCOPY, WITH ARGON PLASMA COAGULATION;  Surgeon: Jinny Carmine, MD;  Location: [facility] ENDOSCOPY;  Service: Endoscopy;;   COLONOSCOPY WITH PROPOFOL  N/A 05/13/2021   Procedure: COLONOSCOPY WITH PROPOFOL ;  Surgeon: Therisa Bi, MD;  Location: [facility] ENDOSCOPY;  Service: Gastroenterology;  Laterality: N/A;   DIALYSIS/PERMA CATHETER INSERTION N/A 05/23/2024   Procedure: DIALYSIS/PERMA CATHETER INSERTION;  Surgeon: Marea Selinda RAMAN, MD;  Location: [facility] INVASIVE CV LAB;  Service: Cardiovascular;  Laterality: N/A;   ESOPHAGOGASTRODUODENOSCOPY N/A 03/14/2021   Procedure: ESOPHAGOGASTRODUODENOSCOPY (EGD);  Surgeon: Onita Elspeth Sharper, DO;  Location: [facility] ENDOSCOPY;  Service: Gastroenterology;  Laterality: N/A;   ESOPHAGOGASTRODUODENOSCOPY N/A 05/13/2021   Procedure: ESOPHAGOGASTRODUODENOSCOPY (EGD);  Surgeon: Therisa Bi, MD;  Location: [facility] ENDOSCOPY;  Service: Gastroenterology;  Laterality: N/A;   ESOPHAGOGASTRODUODENOSCOPY N/A 10/10/2023   Procedure: EGD (ESOPHAGOGASTRODUODENOSCOPY);  Surgeon: Toledo, Ladell POUR, MD;  Location: [facility] ENDOSCOPY;  Service: Gastroenterology;  Laterality: N/A;  Eliquis    ESOPHAGOGASTRODUODENOSCOPY N/A 05/28/2024  Procedure: EGD (ESOPHAGOGASTRODUODENOSCOPY);  Surgeon: Jinny Carmine, MD;  Location: [facility] ENDOSCOPY;  Service: Endoscopy;  Laterality: N/A;   EXCISION CHONCHA BULLOSA Bilateral 06/02/2015   Procedure: BILATERAL CHONCHA BULLOSA;  Surgeon: Carolee Hunter, MD;  Location: [facility];  Service: ENT;   Laterality: Bilateral;   HERNIA REPAIR     MAXILLARY ANTROSTOMY Bilateral 06/02/2015   Procedure: MAXILLARY ANTROSTOMY;  Surgeon: Carolee Hunter, MD;  Location: [facility];  Service: ENT;  Laterality: Bilateral;   NASAL POLYP SURGERY     NECK SURGERY  05/08/2006   no limitations   PACEMAKER LEADLESS INSERTION N/A 12/06/2023   Procedure: PACEMAKER LEADLESS INSERTION;  Surgeon: Ammon Blunt, MD;  Location: [facility] INVASIVE CV LAB;  Service: Cardiovascular;  Laterality: N/A;   UPPER GASTROINTESTINAL ENDOSCOPY      (Not in a hospital admission)  Social History   Socioeconomic History   Marital status: Married    Spouse name: Not on file   Number of children: Not on file   Years of education: Not on file   Highest education level: Not on file  Occupational History   Not on file  Tobacco Use   Smoking status: Former    Current packs/day: 0.00    Average packs/day: 1 pack/day for 57.0 years (57.0 ttl pk-yrs)    Types: Cigarettes    Start date: 69    Quit date: 2020    Years since quitting: 6.0   Smokeless tobacco: Never  Vaping Use   Vaping status: Never Used  Substance and Sexual Activity   Alcohol use: No   Drug use: No   Sexual activity: Not on file  Other Topics Concern   Not on file  Social History Narrative   Not on file   Social Drivers of Health   Tobacco Use: Medium Risk (05/28/2024)   Patient History    Smoking Tobacco Use: Former    Smokeless Tobacco Use: Never    Passive Exposure: Not on file  Financial Resource Strain: Low Risk  (07/12/2023)   Received from [facility]   Overall Financial Resource Strain (CARDIA)    Difficulty of Paying Living Expenses: Not hard at all  Food Insecurity: No Food Insecurity (05/22/2024)   Epic    Worried About Radiation Protection Practitioner of Food in the Last Year: Never true    Ran Out of Food in the Last Year: Never true  Transportation Needs: No Transportation Needs (05/22/2024)   Epic    Lack of  Transportation (Medical): No    Lack of Transportation (Non-Medical): No  Physical Activity: Not on file  Stress: Not on file  Social Connections: Socially Integrated (05/22/2024)   Social Connection and Isolation Panel    Frequency of Communication with Friends and Family: More than three times a week    Frequency of Social Gatherings with Friends and Family: More than three times a week    Attends Religious Services: More than 4 times per year    Active Member of [employer] or Organizations: Yes    Attends Banker Meetings: Never    Marital Status: Married  Catering Manager Violence: Not At Risk (05/22/2024)   Epic    Fear of Current or Ex-Partner: No    Emotionally Abused: No    Physically Abused: No    Sexually Abused: No  Depression (PHQ2-9): Not on file  Alcohol Screen: Not on file  Housing: Low Risk (05/22/2024)   Epic    Unable to Pay for Housing in  the Last Year: No    Number of Times Moved in the Last Year: 0    Homeless in the Last Year: No  Utilities: Not At Risk (05/22/2024)   Epic    Threatened with loss of utilities: No  Health Literacy: Not on file    Family History  Problem Relation Age of Onset   Heart attack Brother      Vitals:   06/03/24 2118 06/04/24 0125 06/04/24 0400 06/04/24 0440  BP: (!) 124/58 (!) 128/54 130/60   Pulse: 76 84 73 69  Resp: (!) 25 15 13 15   Temp: 98.5 F (36.9 C) 98.6 F (37 C) 97.9 F (36.6 C)   TempSrc: Oral Oral    SpO2: 99% 98% 94% 99%  Weight:      Height:        PHYSICAL EXAM General: Chronically ill appearing male, well nourished, in no acute distress. HEENT: Normocephalic and atraumatic. Neck: No JVD.  Lungs: Normal respiratory effort on 3L [place]. Clear bilaterally to auscultation. No wheezes, crackles, rhonchi.  Heart: HRRR. Normal S1 and S2 without gallops or murmurs.  Abdomen: Non-distended appearing.  Msk: Normal strength and tone for age. Extremities: Warm and well perfused. No clubbing, cyanosis. No  edema.  Neuro: Alert and oriented X 3. Psych: Answers questions appropriately.   Labs: Basic Metabolic Panel: Recent Labs    06/03/24 1504 06/04/24 0419  NA 131* 133*  K 5.8* 4.9  CL 93* 94*  CO2 27 26  GLUCOSE 98 94  BUN 56* 57*  CREATININE 5.27* 5.60*  CALCIUM  8.9 8.6*   Liver Function Tests: Recent Labs    06/04/24 0419  AST 12*  ALT 6  ALKPHOS 65  BILITOT 0.2  PROT 6.2*  ALBUMIN  3.9   Recent Labs    06/04/24 0419  LIPASE 11   CBC: Recent Labs    06/03/24 1504 06/04/24 0419  WBC 6.1 5.2  HGB 8.4* 8.1*  HCT 25.9* 25.0*  MCV 104.0* 103.3*  PLT 167 168   Cardiac Enzymes: No results for input(s): CKTOTAL, CKMB, CKMBINDEX, TROPONINIHS in the last 72 hours. BNP: No results for input(s): BNP in the last 72 hours. D-Dimer: No results for input(s): DDIMER in the last 72 hours. Hemoglobin A1C: No results for input(s): HGBA1C in the last 72 hours. Fasting Lipid Panel: No results for input(s): CHOL, HDL, LDLCALC, TRIG, CHOLHDL, LDLDIRECT in the last 72 hours. Thyroid  Function Tests: No results for input(s): TSH, T4TOTAL, T3FREE, THYROIDAB in the last 72 hours.  Invalid input(s): FREET3 Anemia Panel: No results for input(s): VITAMINB12, FOLATE, FERRITIN, TIBC, IRON , RETICCTPCT in the last 72 hours.   Radiology: CT ABDOMEN PELVIS WO CONTRAST Result Date: 06/03/2024 EXAM: CT ABDOMEN AND PELVIS WITHOUT CONTRAST 06/03/2024 06:40:50 PM TECHNIQUE: CT of the abdomen and pelvis was performed without the administration of intravenous contrast. Multiplanar reformatted images are provided for review. Automated exposure control, iterative reconstruction, and/or weight-based adjustment of the mA/kV was utilized to reduce the radiation dose to as low as reasonably achievable. COMPARISON: 05/21/2024 and 02/22/2024. CLINICAL HISTORY: Bowel obstruction. Vomiting, chest pain. 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  FINDINGS: LOWER CHEST: New leadless pacer within the right ventricular apex, partially visualized. LIVER: The liver is unremarkable. GALLBLADDER AND BILE DUCTS: Gallbladder is unremarkable. No biliary ductal dilatation. SPLEEN: No acute abnormality. PANCREAS: No acute abnormality. ADRENAL GLANDS: No acute abnormality. KIDNEYS, URETERS AND BLADDER: Stable 4 mm nonobstructing calculus within the lower pole of the left kidney. No  ureteral calculi. No hydronephrosis. No perinephric inflammatory stranding or fluid collections were identified. The bladder is unremarkable. Moderate bilateral renal cortical atrophy. GI AND BOWEL: Moderate sigmoid diverticulosis. Appendix normal. The stomach, small bowel, and large bowel are otherwise unremarkable. Moderate stool burden without evidence of obstruction. PERITONEUM AND RETROPERITONEUM: No ascites. No free air. VASCULATURE: Aorta is normal in caliber. Moderate aortoiliac atherosclerotic calcification. LYMPH NODES: No lymphadenopathy. REPRODUCTIVE ORGANS: No acute abnormality. BONES AND SOFT TISSUES: Moderate lumbar dextroscoliosis, apex to the right at L3. Osseous structures are age appropriate. No acute bone abnormality. No suspicious lytic or blastic bone lesion. 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 No focal soft tissue abnormality. IMPRESSION: 1. No evidence of bowel obstruction. 2. Moderate stool burden, without obstruction. 3. Moderate bilateral renal cortical atrophy, in keeping with chronic renal insufficiency . 4. Nonobstructing 4 mm left lower pole renal calculus, without hydronephrosis or ureteral calculus. 5. Moderate sigmoid diverticulosis, without diverticulitis. 6. Moderate aortoiliac atherosclerotic calcification. 7. Moderate lumbar dextroscoliosis centered at L3. 8. Stable circumscribed lytic lesion in the left femoral head-neck  junction with dense central calcification, most consistent with an intraosseous lipoma. 9. Partially visualized leadless pacer at the right ventricular apex, new since prior examination . Electronically signed by: Dorethia Molt MD 06/03/2024 07:23 PM EST RP Workstation: [id number]   DG Chest 2 View Result Date: 06/03/2024 CLINICAL DATA:  Chest pain EXAM: CHEST - 2 VIEW COMPARISON:  05/22/2024 FINDINGS: Hyperinflation. A mild pectus excavatum deformity. Double-lumen right central line terminates at the low SVC and superior caval/atrial junction. Midline trachea. Normal heart size. Atherosclerosis in the transverse aorta. No pleural effusion or pneumothorax. Left-greater-than-right biapical pleuroparenchymal scarring. Lower lung predominant interstitial thickening is mild. No lobar consolidation. IMPRESSION: 1. Hyperinflation and interstitial thickening, likely related to COPD/chronic bronchitis. No acute superimposed process. 2. Aortic Atherosclerosis (ICD10-I70.0). Electronically Signed   By: Rockey Kilts M.D.   On: 06/03/2024 16:01   PERIPHERAL VASCULAR CATHETERIZATION Result Date: 05/23/2024 See surgical Martin for result.  DG Chest 1 View Result Date: 05/22/2024 EXAM: 1 VIEW(S) XRAY OF THE CHEST 05/22/2024 03:39:25 PM COMPARISON: 02/22/2024 CLINICAL HISTORY: Acute kidney failure FINDINGS: LINES, TUBES AND DEVICES: Leadless pacemaker in place. LUNGS AND PLEURA: Chronic hyperinflation. Emphysema. No focal pulmonary opacity. No pleural effusion. No pneumothorax. HEART AND MEDIASTINUM: Aortic atherosclerosis. No acute abnormality of the cardiac and mediastinal silhouettes. BONES AND SOFT TISSUES: No acute osseous abnormality. IMPRESSION: 1. No acute findings. 2. Emphysema. Electronically signed by: Waddell Calk MD 05/22/2024 04:09 PM EST RP Workstation: [id number]   CT ABDOMEN PELVIS WO CONTRAST Result Date: 05/21/2024 CLINICAL DATA:  Nausea/vomiting/diarrhea for 3 weeks, dark stools, fatigue EXAM: CT  ABDOMEN AND PELVIS WITHOUT CONTRAST TECHNIQUE: Multidetector CT imaging of the abdomen and pelvis was performed following the standard protocol without IV contrast. RADIATION DOSE REDUCTION: This exam was performed according to the departmental dose-optimization program which includes automated exposure control, adjustment of the mA and/or kV according to patient size and/or use of iterative reconstruction technique. COMPARISON:  04/25/2024 FINDINGS: Lower chest: No acute pleural or parenchymal lung disease. Hepatobiliary: Unremarkable unenhanced appearance of the liver and gallbladder. Pancreas: Unremarkable unenhanced appearance. Spleen: Unremarkable unenhanced appearance. Adrenals/Urinary Tract: Nonobstructing 5 mm calculus lower pole right kidney. No right-sided calculi. No obstructive uropathy within either kidney. The adrenals and bladder are unremarkable. Stomach/Bowel: No bowel obstruction or ileus. Large amount of stool throughout the colon consistent with constipation. The appendix, if still  present, is not well visualized. Scattered sigmoid diverticulosis without diverticulitis. No bowel wall thickening or inflammatory change. Vascular/Lymphatic: Aortic atherosclerosis. No enlarged abdominal or pelvic lymph nodes. Reproductive: Prostate is unremarkable. Other: No free fluid or free intraperitoneal gas. Small fat containing umbilical hernia. Musculoskeletal: No acute or destructive bony abnormalities. Reconstructed images demonstrate no additional findings. IMPRESSION: 1. Large amount of retained stool throughout the colon, consistent with constipation. No bowel obstruction or ileus. 2. Nonobstructing 5 mm left renal calculus. 3.  Aortic Atherosclerosis (ICD10-I70.0). Electronically Signed   By: Ozell Daring M.D.   On: 05/21/2024 20:27    ECHO ordered  TELEMETRY (personally reviewed): ventricular pacing rate 70s  EKG (personally reviewed): ventricular pacing rate 66 bpm  Data reviewed by me  06/04/2024: last 24h vitals tele labs imaging I/O ED provider Martin, admission H&P  Principal Problem:   Chest pain Active Problems:   BPH (benign prostatic hyperplasia)   COPD (chronic obstructive pulmonary disease) (HCC)   Chronic pain   Depression   Hyperkalemia   Iron  deficiency anemia due to chronic blood loss   Nausea and vomiting   Paroxysmal atrial fibrillation (HCC)   Protein-calorie malnutrition, severe   ESRD on dialysis (HCC)    ASSESSMENT AND PLAN:  Riley Martin is a 79 y.o. male  with a past medical history of  paroxsymal atrial fibrillation (on Eliquis , amio), severe COPD (on home O2), ESRD on HD (TTS), hypertension who presented to the ED on 06/03/2024 for abdominal pain and chest pain. Cardiology was consulted for further evaluation.   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 Missed dialysis for 5 days. EKG with ventricular pacing. Troponins 232 > 201 > 176. Started on IV heparin .  - Echo ordered, further recommendations pending these results.  - Nuclear stress test ordered for additional evaluation.  - Continue IV heparin .  - Continue atorvastatin  40 mg daily.  - Continue amiodarone  100 mg twice daily.  - Continue torsemide  40 mg on non-dialysis days. - Minimal troponin elevation most consistent with demand/supply mismatch and not ACS.   This patient's plan of care was discussed and created with Dr. Wilburn and he is in agreement.  Signed: Danita Bloch, Riley Martin  06/04/2024, 7:20 AM [facility]      "

## 2024-06-05 ENCOUNTER — Ambulatory Visit (INDEPENDENT_AMBULATORY_CARE_PROVIDER_SITE_OTHER): Admitting: Vascular Surgery

## 2024-06-05 ENCOUNTER — Encounter: Payer: Self-pay | Admitting: Oncology

## 2024-06-05 ENCOUNTER — Encounter (INDEPENDENT_AMBULATORY_CARE_PROVIDER_SITE_OTHER)

## 2024-06-05 ENCOUNTER — Other Ambulatory Visit: Payer: Self-pay

## 2024-06-05 DIAGNOSIS — Z992 Dependence on renal dialysis: Secondary | ICD-10-CM

## 2024-06-05 DIAGNOSIS — R112 Nausea with vomiting, unspecified: Secondary | ICD-10-CM | POA: Diagnosis not present

## 2024-06-05 DIAGNOSIS — N186 End stage renal disease: Secondary | ICD-10-CM | POA: Diagnosis not present

## 2024-06-05 DIAGNOSIS — R7989 Other specified abnormal findings of blood chemistry: Secondary | ICD-10-CM

## 2024-06-05 LAB — BASIC METABOLIC PANEL WITH GFR
Anion gap: 12 (ref 5–15)
BUN: 58 mg/dL — ABNORMAL HIGH (ref 8–23)
CO2: 27 mmol/L (ref 22–32)
Calcium: 8.4 mg/dL — ABNORMAL LOW (ref 8.9–10.3)
Chloride: 98 mmol/L (ref 98–111)
Creatinine, Ser: 5.5 mg/dL — ABNORMAL HIGH (ref 0.61–1.24)
GFR, Estimated: 10 mL/min — ABNORMAL LOW
Glucose, Bld: 128 mg/dL — ABNORMAL HIGH (ref 70–99)
Potassium: 5.1 mmol/L (ref 3.5–5.1)
Sodium: 137 mmol/L (ref 135–145)

## 2024-06-05 LAB — CBC
HCT: 25.2 % — ABNORMAL LOW (ref 39.0–52.0)
Hemoglobin: 8.2 g/dL — ABNORMAL LOW (ref 13.0–17.0)
MCH: 33.7 pg (ref 26.0–34.0)
MCHC: 32.5 g/dL (ref 30.0–36.0)
MCV: 103.7 fL — ABNORMAL HIGH (ref 80.0–100.0)
Platelets: 162 10*3/uL (ref 150–400)
RBC: 2.43 MIL/uL — ABNORMAL LOW (ref 4.22–5.81)
RDW: 12.8 % (ref 11.5–15.5)
WBC: 6 10*3/uL (ref 4.0–10.5)
nRBC: 0 % (ref 0.0–0.2)

## 2024-06-05 MED ORDER — OXYCODONE HCL 5 MG PO TABS
ORAL_TABLET | ORAL | Status: AC
  Filled 2024-06-05: qty 1

## 2024-06-05 MED ORDER — EPOETIN ALFA-EPBX 10000 UNIT/ML IJ SOLN
10000.0000 [IU] | INTRAMUSCULAR | Status: DC
  Administered 2024-06-05: 10000 [IU] via INTRAVENOUS

## 2024-06-05 MED ORDER — APIXABAN 2.5 MG PO TABS
2.5000 mg | ORAL_TABLET | Freq: Two times a day (BID) | ORAL | Status: DC
  Administered 2024-06-05: 2.5 mg via ORAL
  Filled 2024-06-05: qty 1

## 2024-06-05 MED ORDER — ACETAMINOPHEN 325 MG PO TABS
ORAL_TABLET | ORAL | Status: AC
  Filled 2024-06-05: qty 1

## 2024-06-05 MED ORDER — EPOETIN ALFA-EPBX 10000 UNIT/ML IJ SOLN
INTRAMUSCULAR | Status: AC
  Filled 2024-06-05: qty 1

## 2024-06-05 MED ORDER — ATORVASTATIN CALCIUM 40 MG PO TABS
40.0000 mg | ORAL_TABLET | Freq: Every day | ORAL | 0 refills | Status: AC
  Filled 2024-06-05: qty 30, 30d supply, fill #0

## 2024-06-05 MED ORDER — HEPARIN SODIUM (PORCINE) 1000 UNIT/ML IJ SOLN
INTRAMUSCULAR | Status: AC
  Filled 2024-06-05: qty 5

## 2024-06-05 NOTE — Discharge Summary (Signed)
 " Physician Discharge Summary  Patient: Riley Martin FMW:[id number] DOB: 1946/02/20   Code Status: Full Code Admit date: 06/03/2024 Discharge date: 06/05/2024 Disposition: Home health, PT, OT, and nurse aid PCP: Alla Amis, MD  Recommendations for Outpatient Follow-up:  Follow up with PCP within 1-2 weeks Regarding general hospital follow up and preventative care Recommend CBC, BMP Follow up with cardiology Follow up with nephrology   Discharge Diagnoses:  Principal Problem:   Chest pain Active Problems:   ESRD on dialysis (HCC)   Hyperkalemia   Nausea and vomiting   COPD (chronic obstructive pulmonary disease) (HCC)   Iron  deficiency anemia due to chronic blood loss   Paroxysmal atrial fibrillation (HCC)   BPH (benign prostatic hyperplasia)   Chronic pain   Depression   Protein-calorie malnutrition, severe   Elevated troponin   ESRD (end stage renal disease) on dialysis [facility])  Brief Hospital Course Summary: Riley Martin is a 79 y.o. 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  Patient states that his last dialysis was 5 days ago.  He missed her dialysis today.   ED Course: troponin 232 --> 201, WBC 6.1, potassium 5.8, bicarbonate 27, creatinine 5.27, BUN 56, GFR 10. Negative UA. Temperature normal, blood pressure 106/56, heart rate 82, RR 20, oxygen saturation 94% on room air.  Chest x-ray showed COPD without infiltration.  CT of abdomen/pelvis is negative for acute intra-abdominal issues.     CT abdomen/pelvis: 1. No evidence of bowel obstruction. 2. Moderate stool burden, without obstruction. 3. Moderate bilateral renal cortical atrophy, in keeping with chronic renal insufficiency . 4. Nonobstructing 4 mm left lower pole renal calculus, without hydronephrosis or ureteral  calculus. 5. Moderate sigmoid diverticulosis, without diverticulitis. 6. Moderate aortoiliac atherosclerotic calcification. 7. Moderate lumbar dextroscoliosis centered at L3. 8. Stable circumscribed lytic lesion in the left femoral head-neck junction with dense central calcification, most consistent with an intraosseous lipoma. 9. Partially visualized leadless pacer at the right ventricular apex, new since prior examination .   06/05/24 -cardiology consulted, nephrology consulted  Chest pain: trop 232 --> 201.  Possible non-STEMI.  - f/u consult with cards to r/o ACS - remains on heparin  gtt and has no chest pain currently.  - stress test and echo today.  - prn fentanyl  for severe pain - prn Nitroglycerin  - Patient received 324 mg of aspirin  before arrival - Statin: Start Lipitor 40 mg daily   ESRD on dialysis (TTS): Last dialysis was 5 days ago with issues for his HD center with the weather. -Continue home torsemide  - Consulted Dr. Marcelino of renal. Will start back on HD tomorrow   Hyperkalemia: improved. K+ 5.8>4.9 -s/p 10 g of Lokelma     Nausea and vomiting and chronic abdominal pain: Etiology is not clear.  CT of abdomen/pelvis negative for acute intra-abdominal issues. - Pain control as above - As needed Zofran  and scheduled Reglan  5 mg 3 times daily   COPD (chronic obstructive pulmonary disease) (HCC): Stable -Bronchodilators and prn Mucinex    Iron  deficiency anemia due to chronic blood loss: Hemoglobin 8.4 (7.7 on 05/29/2024) Follow-up repeat CBC   Paroxysmal atrial fibrillation (HCC): Heart rate 80s -Switched Eliquis  to IV heparin  as above - Continue amiodarone    BPH (benign prostatic hyperplasia) - Flomax    Chronic pain -Continue home OxyContin  and Percocet  Depression -Lexapro  and trazodone    Protein-calorie malnutrition, severe: Body weight 48.5 kg, BMI 14.51 - Nutrition supplement  All other chronic conditions were treated with home medications.     Discharge Condition: Stable, improved Recommended discharge diet: Cardiac diet  Consultations: Nephrology  Cardiology   Procedures/Studies: HD Stress test Echo   Allergies as of 06/05/2024       Reactions   Iodinated Contrast Media Other (See Comments), Nausea Only   Dry heaves, patient states he felt like he was on fire and about to pass out.  Pre-syncope, burning  Dry heaves, patient states he felt like he was on fire and about to pass out.    Lisinopril  Swelling   Tongue swelling, neck pain and Headaches    Tramadol Other (See Comments), Shortness Of Breath   Other reaction(s): Other (See Comments) Other Reaction: tachy Dizziness and unsteady gait.   Pregabalin Palpitations   Other reaction(s): Other (See Comments) Other Reaction: edema        Medication List     STOP taking these medications    budesonide  1 MG/2ML nebulizer solution Commonly known as: PULMICORT    erythromycin ophthalmic ointment       TAKE these medications    amiodarone  200 MG tablet Commonly known as: PACERONE  Take 1 tablet (200 mg total) by mouth daily. Reduced from twice daily.  Home med. What changed:  how much to take when to take this additional instructions   apixaban  2.5 MG Tabs tablet Commonly known as: ELIQUIS  Take 2.5 mg by mouth 2 (two) times daily.   atorvastatin  40 MG tablet Commonly known as: LIPITOR Take 1 tablet (40 mg total) by mouth daily.   B-12 2500 MCG Subl Place 1 tablet under the tongue daily.   dicyclomine  10 MG capsule Commonly known as: BENTYL  Take 10 mg by mouth 3 (three) times daily before meals.   escitalopram  5 MG tablet Commonly known as: LEXAPRO  Take 5 mg by mouth daily.   feeding supplement (NEPRO CARB STEADY) Liqd Take 237 mLs by mouth 3 (three) times daily between meals.   fluticasone  50 MCG/ACT nasal spray Commonly known as: FLONASE  SPRAY 2 SPRAYS INTO EACH NOSTRIL EVERY DAY   ipratropium 0.02 % nebulizer solution Commonly  known as: ATROVENT  Take 0.5 mg by nebulization 2 (two) times daily.   ipratropium-albuterol  0.5-2.5 (3) MG/3ML Soln Commonly known as: DUONEB Inhale 3 mLs into the lungs every 6 (six) hours as needed.   midodrine 5 MG tablet Commonly known as: PROAMATINE Take 5 mg by mouth 3 (three) times daily with meals.   mometasone -formoterol  200-5 MCG/ACT Aero Commonly known as: DULERA  Inhale 2 puffs into the lungs 2 (two) times daily.   multivitamin with minerals Tabs tablet Take 1 tablet by mouth daily.   naloxone  4 MG/0.1ML Liqd nasal spray kit Commonly known as: NARCAN  Place 0.4 mg into the nose once. CALL 911. SPR CONTENTS OF ONE SPRAYER (0.1ML) INTO ONE NOSTRIL. REPEAT IN 2-3 MIN IF SYMPTOMS OF OPIOID EMERGENCY PERSIST, ALTERNATE NOSTRILS 11/22/2021   omeprazole 40 MG capsule Commonly known as: PRILOSEC Take 40 mg by mouth 2 (two) times daily.   ondansetron  4 MG disintegrating tablet Commonly known as: ZOFRAN -ODT Take 1 tablet (4 mg total) by mouth every 8 (eight) hours as needed for nausea or vomiting.   oxyCODONE -acetaminophen  10-325 MG tablet Commonly known as: PERCOCET Take 1 tablet by mouth 5 (five) times daily as needed.   OxyCONTIN  10 MG 12 hr tablet Generic drug: oxyCODONE  Take 10 mg  by mouth 3 (three) times daily.   OXYGEN Inhale 2 L into the lungs at bedtime as needed.   sodium chloride  0.65 % Soln nasal spray Commonly known as: OCEAN Place 1 spray into both nostrils as needed for congestion.   tamsulosin  0.4 MG Caps capsule Commonly known as: FLOMAX  Take 0.4 mg by mouth daily.   torsemide  20 MG tablet Commonly known as: DEMADEX  Take 2 tablets (40 mg) by mouth once daily on "Sunday, Monday, Wednesday, and Friday (Non-dialysis days).   traZODone 50 MG tablet Commonly known as: DESYREL Take 25 mg by mouth at bedtime as needed.   Ventolin HFA 108 (90 Base) MCG/ACT inhaler Generic drug: albuterol Inhale 2 puffs into the lungs every 6 (six) hours as needed.    albuterol (2.5 MG/3ML) 0.083% nebulizer solution Commonly known as: PROVENTIL Take 3 mLs by nebulization every 6 (six) hours as needed for wheezing or shortness of breath.        Follow-up Information     Alluri, Krishna C, MD. Go in 2 week(s).   Specialty: Cardiology Contact information: [address] [place] [address] [phone]         Linthavong, Kanhka, MD. Schedule an appointment as soon as possible for a visit in 1 week(s).   Specialty: Family Medicine Contact information: [address] [facility] [place] [address] [phone]                 Subjective   Pt reports feeling well. Denies chest pain. Tolerating HD well.   All questions and concerns were addressed at time of discharge.  Objective  Blood pressure (!) 109/50, pulse 72, temperature 97.7 F (36.5 C), temperature source Oral, resp. rate 13, height 6' (1.829 m), weight 60.6 kg, SpO2 91%.   General: Pt is alert, awake, not in acute distress Cardiovascular: RRR, S1/S2 +, no rubs, no gallops. Aging ecchymosis near tunneled cath insertion site.  Respiratory: CTA bilaterally, no wheezing, no rhonchi Abdominal: Soft, NT, ND, bowel sounds + Extremities: no edema, no cyanosis  The results of significant diagnostics from this hospitalization (including imaging, microbiology, ancillary and laboratory) are listed below for reference.   Imaging studies: NM Myocar Multi W/Spect W/Wall Motion / EF Result Date: 06/04/2024   The study is normal. The study is low risk.   No ST deviation was noted.   Left ventricular function is normal. End diastolic cavity size is normal.   Coronary calcium was present on the attenuation correction CT images.   ECHOCARDIOGRAM COMPLETE Result Date: 06/04/2024    ECHOCARDIOGRAM REPORT   Patient Name:   Riley Martin Date of Exam: 06/04/2024 Medical Rec #:  [id number]          Height:       72.0 in Accession #:    [id number]         Weight:        107.0 lb Date of Birth:  03/23/1946          BSA:          1.633 m Patient Age:    78 years           BP:           122/56 mmHg Patient Gender: M                  HR:           72"  bpm. Exam Location:  [facility] Procedure: 2D Echo, Cardiac Doppler  and Color Doppler (Both Spectral and Color            Flow Doppler were utilized during procedure). Indications:     Chest pain R07.9  History:         Patient has prior history of Echocardiogram examinations, most                  recent 12/25/2020. Arrythmias:Atrial Fibrillation.  Sonographer:     Christopher Furnace Referring Phys:  [id number] CARALYN HUDSON Diagnosing Phys: Keller Paterson  Sonographer Comments: Technically challenging study due to limited acoustic windows, no parasternal window and no apical window. Image acquisition challenging due to patient body habitus and The only view obtainable was subcostal due to body habitus of pectus excavatum. IMPRESSIONS  1. Technically very difficult study with no parasternal or subcoastal views.  2. Left ventricle poorly visualized. Preserved LV systolic function based on limited subcoastal views. Cannot assess for accurate LVEF or wall motion on this study.  3. Right ventricular systolic function is normal. The right ventricular size is normal.  4. The mitral valve was not well visualized.  5. The aortic valve was not well visualized. FINDINGS  Left Ventricle: Left ventricle poorly visualized. Preserved LV systolic function based on limited subcoastal views. Cannot assess for accurate LVEF or wall motion on this study. Right Ventricle: The right ventricular size is normal. No increase in right ventricular wall thickness. Right ventricular systolic function is normal. Left Atrium: Left atrial size was not well visualized. Right Atrium: Right atrial size was not well visualized. Pericardium: There is no evidence of pericardial effusion. Mitral Valve: The mitral valve was not well visualized. Tricuspid Valve: The tricuspid valve is not  well visualized. Aortic Valve: The aortic valve was not well visualized. Pulmonic Valve: The pulmonic valve was not well visualized. Aorta: Aortic root could not be assessed. IAS/Shunts: The interatrial septum was not well visualized.  LEFT VENTRICLE PLAX 2D LVIDd:         3.40 cm LVIDs:         2.50 cm LV PW:         1.20 cm LV IVS:        0.90 cm  Keller Paterson Electronically signed by Keller Paterson Signature Date/Time: 06/04/2024/4:43:04 PM    Final    CT ABDOMEN PELVIS WO CONTRAST Result Date: 06/03/2024 EXAM: CT ABDOMEN AND PELVIS WITHOUT CONTRAST 06/03/2024 06:40:50 PM TECHNIQUE: CT of the abdomen and pelvis was performed without the administration of intravenous contrast. Multiplanar reformatted images are provided for review. Automated exposure control, iterative reconstruction, and/or weight-based adjustment of the mA/kV was utilized to reduce the radiation dose to as low as reasonably achievable. COMPARISON: 05/21/2024 and 02/22/2024. CLINICAL HISTORY: Bowel obstruction. Vomiting, chest pain. 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 FINDINGS: LOWER CHEST: New leadless pacer within the right ventricular apex, partially visualized. LIVER: The liver is unremarkable. GALLBLADDER AND BILE DUCTS: Gallbladder is unremarkable. No biliary ductal dilatation. SPLEEN: No acute abnormality. PANCREAS: No acute abnormality. ADRENAL GLANDS: No acute abnormality. KIDNEYS, URETERS AND BLADDER: Stable 4 mm nonobstructing calculus within the lower pole of the left kidney. No ureteral calculi. No hydronephrosis. No perinephric inflammatory stranding or fluid collections were identified. The bladder is unremarkable. Moderate bilateral renal cortical atrophy. GI AND BOWEL: Moderate sigmoid diverticulosis. Appendix normal. The stomach, small bowel, and large bowel are otherwise unremarkable. Moderate stool burden without evidence of obstruction. PERITONEUM AND RETROPERITONEUM: No ascites. No free air.  VASCULATURE: Aorta is normal  in caliber. Moderate aortoiliac atherosclerotic calcification. LYMPH NODES: No lymphadenopathy. REPRODUCTIVE ORGANS: No acute abnormality. BONES AND SOFT TISSUES: Moderate lumbar dextroscoliosis, apex to the right at L3. Osseous structures are age appropriate. No acute bone abnormality. No suspicious lytic or blastic bone lesion. 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 No focal soft tissue abnormality. IMPRESSION: 1. No evidence of bowel obstruction. 2. Moderate stool burden, without obstruction. 3. Moderate bilateral renal cortical atrophy, in keeping with chronic renal insufficiency . 4. Nonobstructing 4 mm left lower pole renal calculus, without hydronephrosis or ureteral calculus. 5. Moderate sigmoid diverticulosis, without diverticulitis. 6. Moderate aortoiliac atherosclerotic calcification. 7. Moderate lumbar dextroscoliosis centered at L3. 8. Stable circumscribed lytic lesion in the left femoral head-neck junction with dense central calcification, most consistent with an intraosseous lipoma. 9. Partially visualized leadless pacer at the right ventricular apex, new since prior examination . Electronically signed by: Dorethia Molt MD 06/03/2024 07:23 PM EST RP Workstation: [id number]   DG Chest 2 View Result Date: 06/03/2024 CLINICAL DATA:  Chest pain EXAM: CHEST - 2 VIEW COMPARISON:  05/22/2024 FINDINGS: Hyperinflation. A mild pectus excavatum deformity. Double-lumen right central line terminates at the low SVC and superior caval/atrial junction. Midline trachea. Normal heart size. Atherosclerosis in the transverse aorta. No pleural effusion or pneumothorax. Left-greater-than-right biapical pleuroparenchymal scarring. Lower lung predominant interstitial thickening is mild. No lobar consolidation. IMPRESSION: 1. Hyperinflation and interstitial thickening,  likely related to COPD/chronic bronchitis. No acute superimposed process. 2. Aortic Atherosclerosis (ICD10-I70.0). Electronically Signed   By: Rockey Kilts M.D.   On: 06/03/2024 16:01   PERIPHERAL VASCULAR CATHETERIZATION Result Date: 05/23/2024 See surgical note for result.  DG Chest 1 View Result Date: 05/22/2024 EXAM: 1 VIEW(S) XRAY OF THE CHEST 05/22/2024 03:39:25 PM COMPARISON: 02/22/2024 CLINICAL HISTORY: Acute kidney failure FINDINGS: LINES, TUBES AND DEVICES: Leadless pacemaker in place. LUNGS AND PLEURA: Chronic hyperinflation. Emphysema. No focal pulmonary opacity. No pleural effusion. No pneumothorax. HEART AND MEDIASTINUM: Aortic atherosclerosis. No acute abnormality of the cardiac and mediastinal silhouettes. BONES AND SOFT TISSUES: No acute osseous abnormality. IMPRESSION: 1. No acute findings. 2. Emphysema. Electronically signed by: Waddell Calk MD 05/22/2024 04:09 PM EST RP Workstation: [id number]   CT ABDOMEN PELVIS WO CONTRAST Result Date: 05/21/2024 CLINICAL DATA:  Nausea/vomiting/diarrhea for 3 weeks, dark stools, fatigue EXAM: CT ABDOMEN AND PELVIS WITHOUT CONTRAST TECHNIQUE: Multidetector CT imaging of the abdomen and pelvis was performed following the standard protocol without IV contrast. RADIATION DOSE REDUCTION: This exam was performed according to the departmental dose-optimization program which includes automated exposure control, adjustment of the mA and/or kV according to patient size and/or use of iterative reconstruction technique. COMPARISON:  04/25/2024 FINDINGS: Lower chest: No acute pleural or parenchymal lung disease. Hepatobiliary: Unremarkable unenhanced appearance of the liver and gallbladder. Pancreas: Unremarkable unenhanced appearance. Spleen: Unremarkable unenhanced appearance. Adrenals/Urinary Tract: Nonobstructing 5 mm calculus lower pole right kidney. No right-sided calculi. No obstructive uropathy within either kidney. The adrenals and bladder are  unremarkable. Stomach/Bowel: No bowel obstruction or ileus. Large amount of stool throughout the colon consistent with constipation. The appendix, if still present, is not well visualized. Scattered sigmoid diverticulosis without diverticulitis. No bowel wall thickening or inflammatory change. Vascular/Lymphatic: Aortic atherosclerosis. No enlarged abdominal or pelvic lymph nodes. Reproductive: Prostate is unremarkable. Other: No free fluid or free intraperitoneal gas. Small fat containing umbilical hernia. Musculoskeletal: No acute or destructive bony abnormalities. Reconstructed images demonstrate no additional findings. IMPRESSION: 1.  Large amount of retained stool throughout the colon, consistent with constipation. No bowel obstruction or ileus. 2. Nonobstructing 5 mm left renal calculus. 3.  Aortic Atherosclerosis (ICD10-I70.0). Electronically Signed   By: Ozell Daring M.D.   On: 05/21/2024 20:27    Labs: Basic Metabolic Panel: Recent Labs  Lab 06/03/24 1504 06/04/24 0419 06/05/24 0459  NA 131* 133* 137  K 5.8* 4.9 5.1  CL 93* 94* 98  CO2 27 26 27   GLUCOSE 98 94 128*  BUN 56* 57* 58*  CREATININE 5.27* 5.60* 5.50*  CALCIUM  8.9 8.6* 8.4*   CBC: Recent Labs  Lab 06/03/24 1504 06/04/24 0419 06/05/24 0459  WBC 6.1 5.2 6.0  HGB 8.4* 8.1* 8.2*  HCT 25.9* 25.0* 25.2*  MCV 104.0* 103.3* 103.7*  PLT 167 168 162   Microbiology: Results for orders placed or performed during the hospital encounter of 02/21/24  Resp panel by RT-PCR (RSV, Flu A&B, Covid) Anterior Nasal Swab     Status: None   Collection Time: 02/22/24 12:32 AM   Specimen: Anterior Nasal Swab  Result Value Ref Range Status   SARS Coronavirus 2 by RT PCR NEGATIVE NEGATIVE Final    Comment: (NOTE) SARS-CoV-2 target nucleic acids are NOT DETECTED.  The SARS-CoV-2 RNA is generally detectable in upper respiratory specimens during the acute phase of infection. The lowest concentration of SARS-CoV-2 viral copies this assay  can detect is 138 copies/mL. A negative result does not preclude SARS-Cov-2 infection and should not be used as the sole basis for treatment or other patient management decisions. A negative result may occur with  improper specimen collection/handling, submission of specimen other than nasopharyngeal swab, presence of viral mutation(s) within the areas targeted by this assay, and inadequate number of viral copies(<138 copies/mL). A negative result must be combined with clinical observations, patient history, and epidemiological information. The expected result is Negative.  Fact Sheet for Patients:  bloggercourse.com  Fact Sheet for Healthcare Providers:  seriousbroker.it  This test is no t yet approved or cleared by the [place]  FDA and  has been authorized for detection and/or diagnosis of SARS-CoV-2 by FDA under an Emergency Use Authorization (EUA). This EUA will remain  in effect (meaning this test can be used) for the duration of the COVID-19 declaration under Section 564(b)(1) of the Act, 21 U.S.C.section 360bbb-3(b)(1), unless the authorization is terminated  or revoked sooner.       Influenza A by PCR NEGATIVE NEGATIVE Final   Influenza B by PCR NEGATIVE NEGATIVE Final    Comment: (NOTE) The Xpert Xpress SARS-CoV-2/FLU/RSV plus assay is intended as an aid in the diagnosis of influenza from Nasopharyngeal swab specimens and should not be used as a sole basis for treatment. Nasal washings and aspirates are unacceptable for Xpert Xpress SARS-CoV-2/FLU/RSV testing.  Fact Sheet for Patients: bloggercourse.com  Fact Sheet for Healthcare Providers: seriousbroker.it  This test is not yet approved or cleared by the [place]  FDA and has been authorized for detection and/or diagnosis of SARS-CoV-2 by FDA under an Emergency Use Authorization (EUA). This EUA will  remain in effect (meaning this test can be used) for the duration of the COVID-19 declaration under Section 564(b)(1) of the Act, 21 U.S.C. section 360bbb-3(b)(1), unless the authorization is terminated or revoked.     Resp Syncytial Virus by PCR NEGATIVE NEGATIVE Final    Comment: (NOTE) Fact Sheet for Patients: bloggercourse.com  Fact Sheet for Healthcare Providers: seriousbroker.it  This test is not yet approved or cleared by the  [place]  FDA and has been authorized for detection and/or diagnosis of SARS-CoV-2 by FDA under an Emergency Use Authorization (EUA). This EUA will remain in effect (meaning this test can be used) for the duration of the COVID-19 declaration under Section 564(b)(1) of the Act, 21 U.S.C. section 360bbb-3(b)(1), unless the authorization is terminated or revoked.  Performed at [facility], [address], [place], [place] [address]     Time coordinating discharge: Over 30 minutes  Marien LITTIE Piety, MD  [facility] 06/05/2024, 12:07 PM  "

## 2024-06-05 NOTE — Progress Notes (Signed)
 " [facility] CLINIC CARDIOLOGY PROGRESS NOTE       Patient ID: Riley Martin MRN: [id number] DOB/AGE: 06-12-1945 79 y.o.  Admit date: 06/03/2024 Referring Physician Dr. Caleb Exon Primary Physician Alla Amis, MD  Primary Cardiologist Dr. Wilburn Reason for Consultation chest pain  HPI: Riley Martin is a 79 y.o. 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 Cardiology was consulted for further evaluation.   Interval history: - Patient seen and examined this morning, resting comfortably in bed during dialysis. - Denies any recurrence of chest discomfort.  Also denies any shortness of breath. - Discussed echo and stress test results with the patient this morning, overall both were reassuring.  Review of systems complete and found to be negative unless listed above    Past Medical History:  Diagnosis Date   A-fib (HCC)    Arthritis    BPH (benign prostatic hyperplasia)    Cancer (HCC)    skin cancer   Chronic pain    COPD (chronic obstructive pulmonary disease) (HCC)    COVID    GERD (gastroesophageal reflux disease)    Headache    History of insomnia    IBS (irritable bowel syndrome)    Iron  deficiency anemia due to chronic blood loss 07/12/2021   Orthostatic dizziness    PONV (postoperative nausea and vomiting)    Spinal stenosis    Torn rotator cuff    left    Past Surgical History:  Procedure Laterality Date   COLONOSCOPY     COLONOSCOPY N/A 05/28/2024   Procedure: COLONOSCOPY;  Surgeon: Jinny Carmine, MD;  Location: [facility] ENDOSCOPY;  Service: Endoscopy;  Laterality: N/A;   COLONOSCOPY  05/28/2024   Procedure: COLONOSCOPY, WITH ARGON PLASMA COAGULATION;  Surgeon: Jinny Carmine, MD;  Location: [facility] ENDOSCOPY;  Service: Endoscopy;;   COLONOSCOPY WITH PROPOFOL  N/A  05/13/2021   Procedure: COLONOSCOPY WITH PROPOFOL ;  Surgeon: Therisa Bi, MD;  Location: [facility] ENDOSCOPY;  Service: Gastroenterology;  Laterality: N/A;   DIALYSIS/PERMA CATHETER INSERTION N/A 05/23/2024   Procedure: DIALYSIS/PERMA CATHETER INSERTION;  Surgeon: Marea Selinda RAMAN, MD;  Location: [facility] INVASIVE CV LAB;  Service: Cardiovascular;  Laterality: N/A;   ESOPHAGOGASTRODUODENOSCOPY N/A 03/14/2021   Procedure: ESOPHAGOGASTRODUODENOSCOPY (EGD);  Surgeon: Onita Elspeth Sharper, DO;  Location: [facility] ENDOSCOPY;  Service: Gastroenterology;  Laterality: N/A;   ESOPHAGOGASTRODUODENOSCOPY N/A 05/13/2021   Procedure: ESOPHAGOGASTRODUODENOSCOPY (EGD);  Surgeon: Therisa Bi, MD;  Location: [facility] ENDOSCOPY;  Service: Gastroenterology;  Laterality: N/A;   ESOPHAGOGASTRODUODENOSCOPY N/A 10/10/2023   Procedure: EGD (ESOPHAGOGASTRODUODENOSCOPY);  Surgeon: Toledo, Ladell POUR, MD;  Location: [facility] ENDOSCOPY;  Service: Gastroenterology;  Laterality: N/A;  Eliquis    ESOPHAGOGASTRODUODENOSCOPY N/A 05/28/2024   Procedure: EGD (ESOPHAGOGASTRODUODENOSCOPY);  Surgeon: Jinny Carmine, MD;  Location: [facility] ENDOSCOPY;  Service: Endoscopy;  Laterality: N/A;   EXCISION CHONCHA BULLOSA Bilateral 06/02/2015   Procedure: BILATERAL CHONCHA BULLOSA;  Surgeon: Carolee Hunter, MD;  Location: [facility] SURGERY CNTR;  Service: ENT;  Laterality: Bilateral;   HERNIA REPAIR     MAXILLARY ANTROSTOMY Bilateral 06/02/2015   Procedure: MAXILLARY ANTROSTOMY;  Surgeon: Carolee Hunter, MD;  Location: [facility] SURGERY CNTR;  Service: ENT;  Laterality: Bilateral;   NASAL POLYP SURGERY     NECK SURGERY  05/08/2006   no limitations   PACEMAKER LEADLESS INSERTION N/A 12/06/2023   Procedure: PACEMAKER LEADLESS INSERTION;  Surgeon:  Ammon Blunt, MD;  Location: [facility] INVASIVE CV LAB;  Service: Cardiovascular;  Laterality: N/A;   UPPER GASTROINTESTINAL ENDOSCOPY      Medications Prior to Admission  Medication Sig Dispense Refill Last Dose/Taking   albuterol   (PROVENTIL ) (2.5 MG/3ML) 0.083% nebulizer solution Take 3 mLs by nebulization every 6 (six) hours as needed for wheezing or shortness of breath. 75 mL 0 Unknown   amiodarone  (PACERONE ) 200 MG tablet Take 1 tablet (200 mg total) by mouth daily. Reduced from twice daily.  Home med. (Patient taking differently: Take 100 mg by mouth 2 (two) times daily.)   06/02/2024   apixaban  (ELIQUIS ) 2.5 MG TABS tablet Take 2.5 mg by mouth 2 (two) times daily.   06/02/2024 Bedtime   Cyanocobalamin  (B-12) 2500 MCG SUBL Place 1 tablet under the tongue daily. 90 tablet 1 06/02/2024   dicyclomine  (BENTYL ) 10 MG capsule Take 10 mg by mouth 3 (three) times daily before meals.   06/02/2024   escitalopram  (LEXAPRO ) 5 MG tablet Take 5 mg by mouth daily.   06/02/2024   fluticasone  (FLONASE ) 50 MCG/ACT nasal spray SPRAY 2 SPRAYS INTO EACH NOSTRIL EVERY DAY 16 g 0 Unknown   ipratropium (ATROVENT ) 0.02 % nebulizer solution Take 0.5 mg by nebulization 2 (two) times daily.   Unknown   ipratropium-albuterol  (DUONEB) 0.5-2.5 (3) MG/3ML SOLN Inhale 3 mLs into the lungs every 6 (six) hours as needed.   Unknown   midodrine (PROAMATINE) 5 MG tablet Take 5 mg by mouth 3 (three) times daily with meals.   Taking   mometasone -formoterol  (DULERA ) 200-5 MCG/ACT AERO Inhale 2 puffs into the lungs 2 (two) times daily. 1 each 1 Unknown   naloxone  (NARCAN ) nasal spray 4 mg/0.1 mL Place 0.4 mg into the nose once. CALL 911. SPR CONTENTS OF ONE SPRAYER (0.1ML) INTO ONE NOSTRIL. REPEAT IN 2-3 MIN IF SYMPTOMS OF OPIOID EMERGENCY PERSIST, ALTERNATE NOSTRILS 11/22/2021   Unknown   omeprazole (PRILOSEC) 40 MG capsule Take 40 mg by mouth 2 (two) times daily.   06/02/2024   ondansetron  (ZOFRAN -ODT) 4 MG disintegrating tablet Take 1 tablet (4 mg total) by mouth every 8 (eight) hours as needed for nausea or vomiting. 30 tablet 0 Unknown   oxyCODONE -acetaminophen  (PERCOCET) 10-325 MG tablet Take 1 tablet by mouth 5 (five) times daily as needed.   06/03/2024    OXYCONTIN  10 MG 12 hr tablet Take 10 mg by mouth 3 (three) times daily.   06/03/2024   sodium chloride  (OCEAN) 0.65 % SOLN nasal spray Place 1 spray into both nostrils as needed for congestion. 30 mL 1 Unknown   tamsulosin  (FLOMAX ) 0.4 MG CAPS capsule Take 0.4 mg by mouth daily.   06/02/2024   torsemide  (DEMADEX ) 20 MG tablet Take 2 tablets (40 mg) by mouth once daily on Sunday, Monday, Wednesday, and Friday (Non-dialysis days). 32 tablet 1 06/02/2024   traZODone  (DESYREL ) 50 MG tablet Take 25 mg by mouth at bedtime as needed.   Unknown   VENTOLIN  HFA 108 (90 Base) MCG/ACT inhaler Inhale 2 puffs into the lungs every 6 (six) hours as needed.   Unknown   budesonide  (PULMICORT ) 1 MG/2ML nebulizer solution Take 1 mg by nebulization daily. (Patient not taking: Reported on 06/03/2024)   Not Taking   erythromycin ophthalmic ointment Place 1 Application into both eyes at bedtime. (Patient not taking: Reported on 06/03/2024)   Not Taking   Multiple Vitamin (MULTIVITAMIN WITH MINERALS) TABS tablet Take 1 tablet by mouth daily. (Patient not taking: Reported on 06/03/2024)  Not Taking   Nutritional Supplements (FEEDING SUPPLEMENT, NEPRO CARB STEADY,) LIQD Take 237 mLs by mouth 3 (three) times daily between meals.  0    OXYGEN Inhale 2 L into the lungs at bedtime as needed.      Social History   Socioeconomic History   Marital status: Married    Spouse name: Not on file   Number of children: Not on file   Years of education: Not on file   Highest education level: Not on file  Occupational History   Not on file  Tobacco Use   Smoking status: Former    Current packs/day: 0.00    Average packs/day: 1 pack/day for 57.0 years (57.0 ttl pk-yrs)    Types: Cigarettes    Start date: 94    Quit date: 2020    Years since quitting: 6.0   Smokeless tobacco: Never  Vaping Use   Vaping status: Never Used  Substance and Sexual Activity   Alcohol use: No   Drug use: No   Sexual activity: Not on file  Other  Topics Concern   Not on file  Social History Narrative   Not on file   Social Drivers of Health   Tobacco Use: Medium Risk (05/28/2024)   Patient History    Smoking Tobacco Use: Former    Smokeless Tobacco Use: Never    Passive Exposure: Not on file  Financial Resource Strain: Low Risk  (07/12/2023)   Received from [facility]   Overall Financial Resource Strain (CARDIA)    Difficulty of Paying Living Expenses: Not hard at all  Food Insecurity: No Food Insecurity (05/22/2024)   Epic    Worried About Radiation Protection Practitioner of Food in the Last Year: Never true    Ran Out of Food in the Last Year: Never true  Transportation Needs: No Transportation Needs (05/22/2024)   Epic    Lack of Transportation (Medical): No    Lack of Transportation (Non-Medical): No  Physical Activity: Not on file  Stress: Not on file  Social Connections: Socially Integrated (05/22/2024)   Social Connection and Isolation Panel    Frequency of Communication with Friends and Family: More than three times a week    Frequency of Social Gatherings with Friends and Family: More than three times a week    Attends Religious Services: More than 4 times per year    Active Member of [employer] or Organizations: Yes    Attends Banker Meetings: Never    Marital Status: Married  Catering Manager Violence: Not At Risk (05/22/2024)   Epic    Fear of Current or Ex-Partner: No    Emotionally Abused: No    Physically Abused: No    Sexually Abused: No  Depression (PHQ2-9): Not on file  Alcohol Screen: Not on file  Housing: Low Risk (05/22/2024)   Epic    Unable to Pay for Housing in the Last Year: No    Number of Times Moved in the Last Year: 0    Homeless in the Last Year: No  Utilities: Not At Risk (05/22/2024)   Epic    Threatened with loss of utilities: No  Health Literacy: Not on file    Family History  Problem Relation Age of Onset   Heart attack Brother      Vitals:   06/05/24 0737 06/05/24  0754 06/05/24 0814 06/05/24 0830  BP:  129/75 (!) 140/64 119/67  Pulse:  73 73 73  Resp:  14 15  11  Temp:  98 F (36.7 C)    TempSrc:  Oral    SpO2: 93% 94% 93% 90%  Weight:  61.9 kg    Height:        PHYSICAL EXAM General: Chronically ill appearing male, well nourished, in no acute distress. HEENT: Normocephalic and atraumatic. Neck: No JVD.  Lungs: Normal respiratory effort on room air. Clear bilaterally to auscultation. No wheezes, crackles, rhonchi.  Heart: HRRR. Normal S1 and S2 without gallops or murmurs.  Abdomen: Non-distended appearing.  Msk: Normal strength and tone for age. Extremities: Warm and well perfused. No clubbing, cyanosis. No edema.  Neuro: Alert and oriented X 3. Psych: Answers questions appropriately.   Labs: Basic Metabolic Panel: Recent Labs    06/04/24 0419 06/05/24 0459  NA 133* 137  K 4.9 5.1  CL 94* 98  CO2 26 27  GLUCOSE 94 128*  BUN 57* 58*  CREATININE 5.60* 5.50*  CALCIUM  8.6* 8.4*   Liver Function Tests: Recent Labs    06/04/24 0419  AST 12*  ALT 6  ALKPHOS 65  BILITOT 0.2  PROT 6.2*  ALBUMIN  3.9   Recent Labs    06/04/24 0419  LIPASE 11   CBC: Recent Labs    06/04/24 0419 06/05/24 0459  WBC 5.2 6.0  HGB 8.1* 8.2*  HCT 25.0* 25.2*  MCV 103.3* 103.7*  PLT 168 162   Cardiac Enzymes: No results for input(s): CKTOTAL, CKMB, CKMBINDEX, TROPONINIHS in the last 72 hours. BNP: No results for input(s): BNP in the last 72 hours. D-Dimer: No results for input(s): DDIMER in the last 72 hours. Hemoglobin A1C: No results for input(s): HGBA1C in the last 72 hours. Fasting Lipid Panel: No results for input(s): CHOL, HDL, LDLCALC, TRIG, CHOLHDL, LDLDIRECT in the last 72 hours. Thyroid  Function Tests: No results for input(s): TSH, T4TOTAL, T3FREE, THYROIDAB in the last 72 hours.  Invalid input(s): FREET3 Anemia Panel: No results for input(s): VITAMINB12, FOLATE, FERRITIN, TIBC,  IRON , RETICCTPCT in the last 72 hours.   Radiology: NM Myocar Multi W/Spect W/Wall Motion / EF Result Date: 06/04/2024   The study is normal. The study is low risk.   No ST deviation was noted.   Left ventricular function is normal. End diastolic cavity size is normal.   Coronary calcium  was present on the attenuation correction CT images.   ECHOCARDIOGRAM COMPLETE Result Date: 06/04/2024    ECHOCARDIOGRAM REPORT   Patient Name:   Riley Martin Date of Exam: 06/04/2024 Medical Rec #:  [id number]          Height:       72.0 in Accession #:    [id number]         Weight:       107.0 lb Date of Birth:  04/15/1946          BSA:          1.633 m Patient Age:    78 years           BP:           122/56 mmHg Patient Gender: M                  HR:           72 bpm. Exam Location:  [facility] Procedure: 2D Echo, Cardiac Doppler and Color Doppler (Both Spectral and Color            Flow Doppler were utilized during procedure). Indications:  Chest pain R07.9  History:         Patient has prior history of Echocardiogram examinations, most                  recent 12/25/2020. Arrythmias:Atrial Fibrillation.  Sonographer:     Christopher Furnace Referring Phys:  [id number] Maitri Schnoebelen Diagnosing Phys: Keller Paterson  Sonographer Comments: Technically challenging study due to limited acoustic windows, no parasternal window and no apical window. Image acquisition challenging due to patient body habitus and The only view obtainable was subcostal due to body habitus of pectus excavatum. IMPRESSIONS  1. Technically very difficult study with no parasternal or subcoastal views.  2. Left ventricle poorly visualized. Preserved LV systolic function based on limited subcoastal views. Cannot assess for accurate LVEF or wall motion on this study.  3. Right ventricular systolic function is normal. The right ventricular size is normal.  4. The mitral valve was not well visualized.  5. The aortic valve was not well visualized. FINDINGS  Left  Ventricle: Left ventricle poorly visualized. Preserved LV systolic function based on limited subcoastal views. Cannot assess for accurate LVEF or wall motion on this study. Right Ventricle: The right ventricular size is normal. No increase in right ventricular wall thickness. Right ventricular systolic function is normal. Left Atrium: Left atrial size was not well visualized. Right Atrium: Right atrial size was not well visualized. Pericardium: There is no evidence of pericardial effusion. Mitral Valve: The mitral valve was not well visualized. Tricuspid Valve: The tricuspid valve is not well visualized. Aortic Valve: The aortic valve was not well visualized. Pulmonic Valve: The pulmonic valve was not well visualized. Aorta: Aortic root could not be assessed. IAS/Shunts: The interatrial septum was not well visualized.  LEFT VENTRICLE PLAX 2D LVIDd:         3.40 cm LVIDs:         2.50 cm LV PW:         1.20 cm LV IVS:        0.90 cm  Keller Paterson Electronically signed by Keller Paterson Signature Date/Time: 06/04/2024/4:43:04 PM    Final    CT ABDOMEN PELVIS WO CONTRAST Result Date: 06/03/2024 EXAM: CT ABDOMEN AND PELVIS WITHOUT CONTRAST 06/03/2024 06:40:50 PM TECHNIQUE: CT of the abdomen and pelvis was performed without the administration of intravenous contrast. Multiplanar reformatted images are provided for review. Automated exposure control, iterative reconstruction, and/or weight-based adjustment of the mA/kV was utilized to reduce the radiation dose to as low as reasonably achievable. COMPARISON: 05/21/2024 and 02/22/2024. CLINICAL HISTORY: Bowel obstruction. Vomiting, chest pain. 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 FINDINGS: LOWER CHEST: New leadless pacer within the right ventricular apex, partially visualized. LIVER: The liver is unremarkable. GALLBLADDER AND BILE DUCTS: Gallbladder is unremarkable. No biliary ductal dilatation. SPLEEN: No acute abnormality. PANCREAS: No acute  abnormality. ADRENAL GLANDS: No acute abnormality. KIDNEYS, URETERS AND BLADDER: Stable 4 mm nonobstructing calculus within the lower pole of the left kidney. No ureteral calculi. No hydronephrosis. No perinephric inflammatory stranding or fluid collections were identified. The bladder is unremarkable. Moderate bilateral renal cortical atrophy. GI AND BOWEL: Moderate sigmoid diverticulosis. Appendix normal. The stomach, small bowel, and large bowel are otherwise unremarkable. Moderate stool burden without evidence of obstruction. PERITONEUM AND RETROPERITONEUM: No ascites. No free air. VASCULATURE: Aorta is normal in caliber. Moderate aortoiliac atherosclerotic calcification. LYMPH NODES: No lymphadenopathy. REPRODUCTIVE ORGANS: No acute abnormality. BONES AND SOFT TISSUES: Moderate lumbar dextroscoliosis, apex to the right at L3. Osseous  structures are age appropriate. No acute bone abnormality. No suspicious lytic or blastic bone lesion. 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 No focal soft tissue abnormality. IMPRESSION: 1. No evidence of bowel obstruction. 2. Moderate stool burden, without obstruction. 3. Moderate bilateral renal cortical atrophy, in keeping with chronic renal insufficiency . 4. Nonobstructing 4 mm left lower pole renal calculus, without hydronephrosis or ureteral calculus. 5. Moderate sigmoid diverticulosis, without diverticulitis. 6. Moderate aortoiliac atherosclerotic calcification. 7. Moderate lumbar dextroscoliosis centered at L3. 8. Stable circumscribed lytic lesion in the left femoral head-neck junction with dense central calcification, most consistent with an intraosseous lipoma. 9. Partially visualized leadless pacer at the right ventricular apex, new since prior examination . Electronically signed by: Dorethia Molt MD 06/03/2024 07:23 PM EST RP Workstation:  [id number]   DG Chest 2 View Result Date: 06/03/2024 CLINICAL DATA:  Chest pain EXAM: CHEST - 2 VIEW COMPARISON:  05/22/2024 FINDINGS: Hyperinflation. A mild pectus excavatum deformity. Double-lumen right central line terminates at the low SVC and superior caval/atrial junction. Midline trachea. Normal heart size. Atherosclerosis in the transverse aorta. No pleural effusion or pneumothorax. Left-greater-than-right biapical pleuroparenchymal scarring. Lower lung predominant interstitial thickening is mild. No lobar consolidation. IMPRESSION: 1. Hyperinflation and interstitial thickening, likely related to COPD/chronic bronchitis. No acute superimposed process. 2. Aortic Atherosclerosis (ICD10-I70.0). Electronically Signed   By: Rockey Kilts M.D.   On: 06/03/2024 16:01   PERIPHERAL VASCULAR CATHETERIZATION Result Date: 05/23/2024 See surgical note for result.  DG Chest 1 View Result Date: 05/22/2024 EXAM: 1 VIEW(S) XRAY OF THE CHEST 05/22/2024 03:39:25 PM COMPARISON: 02/22/2024 CLINICAL HISTORY: Acute kidney failure FINDINGS: LINES, TUBES AND DEVICES: Leadless pacemaker in place. LUNGS AND PLEURA: Chronic hyperinflation. Emphysema. No focal pulmonary opacity. No pleural effusion. No pneumothorax. HEART AND MEDIASTINUM: Aortic atherosclerosis. No acute abnormality of the cardiac and mediastinal silhouettes. BONES AND SOFT TISSUES: No acute osseous abnormality. IMPRESSION: 1. No acute findings. 2. Emphysema. Electronically signed by: Waddell Calk MD 05/22/2024 04:09 PM EST RP Workstation: [id number]   CT ABDOMEN PELVIS WO CONTRAST Result Date: 05/21/2024 CLINICAL DATA:  Nausea/vomiting/diarrhea for 3 weeks, dark stools, fatigue EXAM: CT ABDOMEN AND PELVIS WITHOUT CONTRAST TECHNIQUE: Multidetector CT imaging of the abdomen and pelvis was performed following the standard protocol without IV contrast. RADIATION DOSE REDUCTION: This exam was performed according to the departmental dose-optimization program  which includes automated exposure control, adjustment of the mA and/or kV according to patient size and/or use of iterative reconstruction technique. COMPARISON:  04/25/2024 FINDINGS: Lower chest: No acute pleural or parenchymal lung disease. Hepatobiliary: Unremarkable unenhanced appearance of the liver and gallbladder. Pancreas: Unremarkable unenhanced appearance. Spleen: Unremarkable unenhanced appearance. Adrenals/Urinary Tract: Nonobstructing 5 mm calculus lower pole right kidney. No right-sided calculi. No obstructive uropathy within either kidney. The adrenals and bladder are unremarkable. Stomach/Bowel: No bowel obstruction or ileus. Large amount of stool throughout the colon consistent with constipation. The appendix, if still present, is not well visualized. Scattered sigmoid diverticulosis without diverticulitis. No bowel wall thickening or inflammatory change. Vascular/Lymphatic: Aortic atherosclerosis. No enlarged abdominal or pelvic lymph nodes. Reproductive: Prostate is unremarkable. Other: No free fluid or free intraperitoneal gas. Small fat containing umbilical hernia. Musculoskeletal: No acute or destructive bony abnormalities. Reconstructed images demonstrate no additional findings. IMPRESSION: 1. Large amount of retained stool throughout the colon, consistent with constipation. No bowel obstruction or ileus. 2. Nonobstructing 5 mm left renal calculus. 3.  Aortic Atherosclerosis (ICD10-I70.0). Electronically  Signed   By: Ozell Daring M.D.   On: 05/21/2024 20:27    ECHO as above  TELEMETRY (personally reviewed): ventricular pacing rate 70s  EKG (personally reviewed): ventricular pacing rate 66 bpm  Data reviewed by me 06/05/2024: last 24h vitals tele labs imaging I/O ED provider note, admission H&P, hospitalist progress note  Principal Problem:   Chest pain Active Problems:   BPH (benign prostatic hyperplasia)   COPD (chronic obstructive pulmonary disease) (HCC)   Chronic pain    Depression   Hyperkalemia   Iron  deficiency anemia due to chronic blood loss   Nausea and vomiting   Paroxysmal atrial fibrillation (HCC)   Protein-calorie malnutrition, severe   ESRD on dialysis (HCC)    ASSESSMENT AND PLAN:  Riley Martin is a 79 y.o. male  with a past medical history of  paroxsymal atrial fibrillation (on Eliquis , amio), severe COPD (on home O2), ESRD on HD (TTS), hypertension who presented to the ED on 06/03/2024 for abdominal pain and chest pain. Cardiology was consulted for further evaluation.   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 Missed dialysis for 5 days. EKG with ventricular pacing. Troponins 232 > 201 > 176. Started on IV heparin .  Nuclear stress test without evidence of ischemia or infarction.  Echo this admission with EF preserved, limited study overall but without obvious abnormality. - Recommend transitioning back to Eliquis  2.5 mg twice daily today (dose appropriate given age, renal function). - Continue atorvastatin  40 mg daily.  - Continue amiodarone  100 mg twice daily.  - Continue torsemide  40 mg on non-dialysis days. - Minimal troponin elevation most consistent with demand/supply mismatch and not ACS.   Cardiology will sign off. Please haiku with questions or re-engage if needed. Follow up with Dr. Wilburn in 2 weeks.   This patient's plan of care was discussed and created with Dr. Wilburn and he is in agreement.  Signed: Danita Bloch, PA-C  06/05/2024, 9:01 AM [facility] Card[facility]      "

## 2024-06-05 NOTE — Progress Notes (Signed)
 D/C order noted. Contacted [facility]      to be advised of pt and d/c today. Requested documents faxed to clinic for continuation of care.  Suzen Satchel Dialysis Navigator [phone]

## 2024-06-05 NOTE — Progress Notes (Signed)
 PHARMACY - ANTICOAGULATION CONSULT NOTE  Pharmacy Consult for Heparin   Indication: chest pain/ACS, AFib   Allergies[1]  Patient Measurements: Height: 6' (182.9 cm) Weight: 48.5 kg (107 lb) IBW/kg (Calculated) : 77.6 HEPARIN  DW (KG): 48.5  Vital Signs: Temp: 97.5 F (36.4 C) (01/28 2340) Temp Source: Oral (01/28 1655) BP: 121/51 (01/28 2340) Pulse Rate: 73 (01/28 2340)  Labs: Recent Labs    06/03/24 1504 06/03/24 2348 06/04/24 0419 06/04/24 1049 06/04/24 2237  HGB 8.4*  --  8.1*  --   --   HCT 25.9*  --  25.0*  --   --   PLT 167  --  168  --   --   APTT  --  35  --  36 42*  LABPROT 14.0  --   --   --   --   INR 1.0  --   --   --   --   HEPARINUNFRC  --  >1.10*  --  >1.10*  --   CREATININE 5.27*  --  5.60*  --   --     Estimated Creatinine Clearance: 7.2 mL/min (A) (by C-G formula based on SCr of 5.6 mg/dL (H)).   Medical History: Past Medical History:  Diagnosis Date   A-fib (HCC)    Arthritis    BPH (benign prostatic hyperplasia)    Cancer (HCC)    skin cancer   Chronic pain    COPD (chronic obstructive pulmonary disease) (HCC)    COVID    GERD (gastroesophageal reflux disease)    Headache    History of insomnia    IBS (irritable bowel syndrome)    Iron  deficiency anemia due to chronic blood loss 07/12/2021   Orthostatic dizziness    PONV (postoperative nausea and vomiting)    Spinal stenosis    Torn rotator cuff    left    Medications:  Medications Prior to Admission  Medication Sig Dispense Refill Last Dose/Taking   albuterol  (PROVENTIL ) (2.5 MG/3ML) 0.083% nebulizer solution Take 3 mLs by nebulization every 6 (six) hours as needed for wheezing or shortness of breath. 75 mL 0 Unknown   amiodarone  (PACERONE ) 200 MG tablet Take 1 tablet (200 mg total) by mouth daily. Reduced from twice daily.  Home med. (Patient taking differently: Take 100 mg by mouth 2 (two) times daily.)   06/02/2024   apixaban  (ELIQUIS ) 2.5 MG TABS tablet Take 2.5 mg by mouth 2  (two) times daily.   06/02/2024 Bedtime   Cyanocobalamin  (B-12) 2500 MCG SUBL Place 1 tablet under the tongue daily. 90 tablet 1 06/02/2024   dicyclomine  (BENTYL ) 10 MG capsule Take 10 mg by mouth 3 (three) times daily before meals.   06/02/2024   escitalopram  (LEXAPRO ) 5 MG tablet Take 5 mg by mouth daily.   06/02/2024   fluticasone  (FLONASE ) 50 MCG/ACT nasal spray SPRAY 2 SPRAYS INTO EACH NOSTRIL EVERY DAY 16 g 0 Unknown   ipratropium (ATROVENT ) 0.02 % nebulizer solution Take 0.5 mg by nebulization 2 (two) times daily.   Unknown   ipratropium-albuterol  (DUONEB) 0.5-2.5 (3) MG/3ML SOLN Inhale 3 mLs into the lungs every 6 (six) hours as needed.   Unknown   midodrine (PROAMATINE) 5 MG tablet Take 5 mg by mouth 3 (three) times daily with meals.   Taking   mometasone -formoterol  (DULERA ) 200-5 MCG/ACT AERO Inhale 2 puffs into the lungs 2 (two) times daily. 1 each 1 Unknown   naloxone  (NARCAN ) nasal spray 4 mg/0.1 mL Place 0.4 mg into the nose  once. CALL 911. SPR CONTENTS OF ONE SPRAYER (0.1ML) INTO ONE NOSTRIL. REPEAT IN 2-3 MIN IF SYMPTOMS OF OPIOID EMERGENCY PERSIST, ALTERNATE NOSTRILS 11/22/2021   Unknown   omeprazole (PRILOSEC) 40 MG capsule Take 40 mg by mouth 2 (two) times daily.   06/02/2024   ondansetron  (ZOFRAN -ODT) 4 MG disintegrating tablet Take 1 tablet (4 mg total) by mouth every 8 (eight) hours as needed for nausea or vomiting. 30 tablet 0 Unknown   oxyCODONE -acetaminophen  (PERCOCET) 10-325 MG tablet Take 1 tablet by mouth 5 (five) times daily as needed.   06/03/2024   OXYCONTIN  10 MG 12 hr tablet Take 10 mg by mouth 3 (three) times daily.   06/03/2024   sodium chloride  (OCEAN) 0.65 % SOLN nasal spray Place 1 spray into both nostrils as needed for congestion. 30 mL 1 Unknown   tamsulosin  (FLOMAX ) 0.4 MG CAPS capsule Take 0.4 mg by mouth daily.   06/02/2024   torsemide  (DEMADEX ) 20 MG tablet Take 2 tablets (40 mg) by mouth once daily on Sunday, Monday, Wednesday, and Friday (Non-dialysis days). 32  tablet 1 06/02/2024   traZODone  (DESYREL ) 50 MG tablet Take 25 mg by mouth at bedtime as needed.   Unknown   VENTOLIN  HFA 108 (90 Base) MCG/ACT inhaler Inhale 2 puffs into the lungs every 6 (six) hours as needed.   Unknown   budesonide  (PULMICORT ) 1 MG/2ML nebulizer solution Take 1 mg by nebulization daily. (Patient not taking: Reported on 06/03/2024)   Not Taking   erythromycin ophthalmic ointment Place 1 Application into both eyes at bedtime. (Patient not taking: Reported on 06/03/2024)   Not Taking   Multiple Vitamin (MULTIVITAMIN WITH MINERALS) TABS tablet Take 1 tablet by mouth daily. (Patient not taking: Reported on 06/03/2024)   Not Taking   Nutritional Supplements (FEEDING SUPPLEMENT, NEPRO CARB STEADY,) LIQD Take 237 mLs by mouth 3 (three) times daily between meals.  0    OXYGEN Inhale 2 L into the lungs at bedtime as needed.       Assessment: Pharmacy consulted to dose heparin  in this 79 year old male admitted with ACS/NSTEMI.   Pt was on Eliquis  2.5 mg PO BID PTA, last dose on 1/26 PM.  CrCl = 7.7 ml/min   1/28 1049: aPTT 36, HL >1.1 1/28 2237: aPTT 42  Goal of Therapy:  Heparin  level 0.3-0.7 units/ml aPTT 66 - 102 seconds Monitor platelets by anticoagulation protocol: Yes   Plan:  1/28:  aPTT @ 2237 = 42, SUBtherapeutic - Will order heparin  1450 units IV X 1 bolus and increase drip rate to 900 units/hr - Will use aPTT to guide dosing until correlating with HL  - Will draw aPTT, HL 8 hrs after rate change - CBC daily   Nakaila Freeze D, PharmD 06/05/2024,12:59 AM        [1]  Allergies Allergen Reactions   Iodinated Contrast Media Other (See Comments) and Nausea Only    Dry heaves, patient states he felt like he was on fire and about to pass out.  Pre-syncope, burning  Dry heaves, patient states he felt like he was on fire and about to pass out.    Lisinopril  Swelling    Tongue swelling, neck pain and Headaches    Tramadol Other (See Comments) and Shortness Of Breath     Other reaction(s): Other (See Comments) Other Reaction: tachy Dizziness and unsteady gait.   Pregabalin Palpitations    Other reaction(s): Other (See Comments) Other Reaction: edema

## 2024-06-05 NOTE — Progress Notes (Signed)
 Hemodialysis Note:  Received patient in bed to unit. Alert and oriented. Informed consent singed and in chart.  Treatment initiated: 0814 Treatment completed: 1121  Access used: Right IJ catheter Access issues: None  100 ml saline bolus given due to hypotension. Transported back to room, alert without acute distress. Report given to patient's RN.  Total UF removed: 1.3 Liter Medications given: Oxycodone  5 mg Tablet, Tylenol  650 mg tablet, Retacrit  10000 units IV  Post HD weight: 60.6 Kg  [facility]

## 2024-06-05 NOTE — Progress Notes (Signed)
 " [facility]  ROUNDING NOTE   Subjective:   Riley Martin is a 79 year old male with past medical conditions including COPD, BPH, atrial fibrillation on Eliquis , IBS-C, pacemaker, anemia, and chronic kidney disease stage IV.  Patient presents to the emergency department for vomiting and chest pain and has been admitted under observation for Generalized abdominal pain [R10.84] Burning chest pain [R07.89] Elevated troponin [R79.89] ESRD (end stage renal disease) on dialysis (HCC) [N18.6, Z99.2] Chest pain [R07.9] Nausea and vomiting, unspecified vomiting type [R11.2]  During a recent admission, patient was initiated on hemodialysis. He currently receives dialysis at [facility] on a TTIS schedule.  Update: Patient seen and evaluated during dialysis   HEMODIALYSIS FLOWSHEET:  Blood Flow Rate (mL/min): 399 mL/min Arterial Pressure (mmHg): -162.41 mmHg Venous Pressure (mmHg): 135.75 mmHg TMP (mmHg): 4.85 mmHg Ultrafiltration Rate (mL/min): 0 mL/min Dialysate Flow Rate (mL/min): 300 ml/min Bolus Amount (mL): 100 mL  Denies nausea or vomiting Room air  Objective:  Vital signs in last 24 hours:  Temp:  [97.5 F (36.4 C)-98.3 F (36.8 C)] 98 F (36.7 C) (01/29 0754) Pulse Rate:  [25-85] 61 (01/29 1030) Resp:  [11-20] 14 (01/29 1030) BP: (98-140)/(46-75) 98/46 (01/29 1030) SpO2:  [90 %-100 %] 92 % (01/29 1030) Weight:  [61.9 kg-66.4 kg] 61.9 kg (01/29 0754)  Weight change: 17.9 kg Filed Weights   06/03/24 1501 06/05/24 0500 06/05/24 0754  Weight: 48.5 kg 66.4 kg 61.9 kg    Intake/Output: I/O last 3 completed shifts: In: 721.7 [P.O.:480; I.V.:241.7] Out: 500 [Urine:500]   Intake/Output this shift:  No intake/output data recorded.  Physical Exam: General: NAD  Head: Normocephalic, atraumatic. Moist oral mucosal membranes  Eyes: Anicteric  Lungs:  Clear to auscultation, normal effort  Heart: Regular rate and rhythm  Abdomen:  Soft, nontender   Extremities:  No peripheral edema.  Neurologic: Awake, alert, conversant  Skin: Warm,dry, no rash  Access: Rt internal jugular permcath    Basic Metabolic Panel: Recent Labs  Lab 06/03/24 1504 06/04/24 0419 06/05/24 0459  NA 131* 133* 137  K 5.8* 4.9 5.1  CL 93* 94* 98  CO2 27 26 27   GLUCOSE 98 94 128*  BUN 56* 57* 58*  CREATININE 5.27* 5.60* 5.50*  CALCIUM  8.9 8.6* 8.4*    Liver Function Tests: Recent Labs  Lab 06/04/24 0419  AST 12*  ALT 6  ALKPHOS 65  BILITOT 0.2  PROT 6.2*  ALBUMIN  3.9   Recent Labs  Lab 06/04/24 0419  LIPASE 11   No results for input(s): AMMONIA in the last 168 hours.  CBC: Recent Labs  Lab 06/03/24 1504 06/04/24 0419 06/05/24 0459  WBC 6.1 5.2 6.0  HGB 8.4* 8.1* 8.2*  HCT 25.9* 25.0* 25.2*  MCV 104.0* 103.3* 103.7*  PLT 167 168 162    Cardiac Enzymes: No results for input(s): CKTOTAL, CKMB, CKMBINDEX, TROPONINI in the last 168 hours.  BNP: Invalid input(s): POCBNP  CBG: No results for input(s): GLUCAP in the last 168 hours.  Microbiology: Results for orders placed or performed during the hospital encounter of 02/21/24  Resp panel by RT-PCR (RSV, Flu A&B, Covid) Anterior Nasal Swab     Status: None   Collection Time: 02/22/24 12:32 AM   Specimen: Anterior Nasal Swab  Result Value Ref Range Status   SARS Coronavirus 2 by RT PCR NEGATIVE NEGATIVE Final    Comment: (NOTE) SARS-CoV-2 target nucleic acids are NOT DETECTED.  The SARS-CoV-2 RNA is generally detectable in upper respiratory specimens  during the acute phase of infection. The lowest concentration of SARS-CoV-2 viral copies this assay can detect is 138 copies/mL. A negative result does not preclude SARS-Cov-2 infection and should not be used as the sole basis for treatment or other patient management decisions. A negative result may occur with  improper specimen collection/handling, submission of specimen other than nasopharyngeal swab, presence  of viral mutation(s) within the areas targeted by this assay, and inadequate number of viral copies(<138 copies/mL). A negative result must be combined with clinical observations, patient history, and epidemiological information. The expected result is Negative.  Fact Sheet for Patients:  bloggercourse.com  Fact Sheet for Healthcare Providers:  seriousbroker.it  This test is no t yet approved or cleared by the [place]  FDA and  has been authorized for detection and/or diagnosis of SARS-CoV-2 by FDA under an Emergency Use Authorization (EUA). This EUA will remain  in effect (meaning this test can be used) for the duration of the COVID-19 declaration under Section 564(b)(1) of the Act, 21 U.S.C.section 360bbb-3(b)(1), unless the authorization is terminated  or revoked sooner.       Influenza A by PCR NEGATIVE NEGATIVE Final   Influenza B by PCR NEGATIVE NEGATIVE Final    Comment: (NOTE) The Xpert Xpress SARS-CoV-2/FLU/RSV plus assay is intended as an aid in the diagnosis of influenza from Nasopharyngeal swab specimens and should not be used as a sole basis for treatment. Nasal washings and aspirates are unacceptable for Xpert Xpress SARS-CoV-2/FLU/RSV testing.  Fact Sheet for Patients: bloggercourse.com  Fact Sheet for Healthcare Providers: seriousbroker.it  This test is not yet approved or cleared by the [place]  FDA and has been authorized for detection and/or diagnosis of SARS-CoV-2 by FDA under an Emergency Use Authorization (EUA). This EUA will remain in effect (meaning this test can be used) for the duration of the COVID-19 declaration under Section 564(b)(1) of the Act, 21 U.S.C. section 360bbb-3(b)(1), unless the authorization is terminated or revoked.     Resp Syncytial Virus by PCR NEGATIVE NEGATIVE Final    Comment: (NOTE) Fact Sheet for  Patients: bloggercourse.com  Fact Sheet for Healthcare Providers: seriousbroker.it  This test is not yet approved or cleared by the [place]  FDA and has been authorized for detection and/or diagnosis of SARS-CoV-2 by FDA under an Emergency Use Authorization (EUA). This EUA will remain in effect (meaning this test can be used) for the duration of the COVID-19 declaration under Section 564(b)(1) of the Act, 21 U.S.C. section 360bbb-3(b)(1), unless the authorization is terminated or revoked.  Performed at [facility], [address] [place], [place] [address]     Coagulation Studies: Recent Labs    06/03/24 1504  LABPROT 14.0  INR 1.0    Urinalysis: Recent Labs    06/03/24 2348  COLORURINE STRAW*  LABSPEC 1.006  PHURINE 7.0  GLUCOSEU NEGATIVE  HGBUR NEGATIVE  BILIRUBINUR NEGATIVE  KETONESUR NEGATIVE  PROTEINUR NEGATIVE  NITRITE NEGATIVE  LEUKOCYTESUR NEGATIVE       Imaging: NM Myocar Multi W/Spect W/Wall Motion / EF Result Date: 06/04/2024   The study is normal. The study is low risk.   No ST deviation was noted.   Left ventricular function is normal. End diastolic cavity size is normal.   Coronary calcium  was present on the attenuation correction CT images.   ECHOCARDIOGRAM COMPLETE Result Date: 06/04/2024    ECHOCARDIOGRAM REPORT   Patient Name:   Riley Martin Felty Date of Exam: 06/04/2024 Medical Rec #:  [id number]  Height:       72.0 in Accession #:    [id number]         Weight:       107.0 lb Date of Birth:  08/06/1945          BSA:          1.633 m Patient Age:    78 years           BP:           122/56 mmHg Patient Gender: M                  HR:           72 bpm. Exam Location:  [facility] Procedure: 2D Echo, Cardiac Doppler and Color Doppler (Both Spectral and Color            Flow Doppler were utilized during procedure). Indications:     Chest pain R07.9  History:         Patient has prior  history of Echocardiogram examinations, most                  recent 12/25/2020. Arrythmias:Atrial Fibrillation.  Sonographer:     Christopher Furnace Referring Phys:  [id number] CARALYN HUDSON Diagnosing Phys: Keller Paterson  Sonographer Comments: Technically challenging study due to limited acoustic windows, no parasternal window and no apical window. Image acquisition challenging due to patient body habitus and The only view obtainable was subcostal due to body habitus of pectus excavatum. IMPRESSIONS  1. Technically very difficult study with no parasternal or subcoastal views.  2. Left ventricle poorly visualized. Preserved LV systolic function based on limited subcoastal views. Cannot assess for accurate LVEF or wall motion on this study.  3. Right ventricular systolic function is normal. The right ventricular size is normal.  4. The mitral valve was not well visualized.  5. The aortic valve was not well visualized. FINDINGS  Left Ventricle: Left ventricle poorly visualized. Preserved LV systolic function based on limited subcoastal views. Cannot assess for accurate LVEF or wall motion on this study. Right Ventricle: The right ventricular size is normal. No increase in right ventricular wall thickness. Right ventricular systolic function is normal. Left Atrium: Left atrial size was not well visualized. Right Atrium: Right atrial size was not well visualized. Pericardium: There is no evidence of pericardial effusion. Mitral Valve: The mitral valve was not well visualized. Tricuspid Valve: The tricuspid valve is not well visualized. Aortic Valve: The aortic valve was not well visualized. Pulmonic Valve: The pulmonic valve was not well visualized. Aorta: Aortic root could not be assessed. IAS/Shunts: The interatrial septum was not well visualized.  LEFT VENTRICLE PLAX 2D LVIDd:         3.40 cm LVIDs:         2.50 cm LV PW:         1.20 cm LV IVS:        0.90 cm  Keller Paterson Electronically signed by Keller Paterson Signature  Date/Time: 06/04/2024/4:43:04 PM    Final    CT ABDOMEN PELVIS WO CONTRAST Result Date: 06/03/2024 EXAM: CT ABDOMEN AND PELVIS WITHOUT CONTRAST 06/03/2024 06:40:50 PM TECHNIQUE: CT of the abdomen and pelvis was performed without the administration of intravenous contrast. Multiplanar reformatted images are provided for review. Automated exposure control, iterative reconstruction, and/or weight-based adjustment of the mA/kV was utilized to reduce the radiation dose to as low as reasonably achievable. COMPARISON: 05/21/2024 and 02/22/2024. CLINICAL HISTORY: Bowel obstruction. Vomiting,  chest pain. 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 FINDINGS: LOWER CHEST: New leadless pacer within the right ventricular apex, partially visualized. LIVER: The liver is unremarkable. GALLBLADDER AND BILE DUCTS: Gallbladder is unremarkable. No biliary ductal dilatation. SPLEEN: No acute abnormality. PANCREAS: No acute abnormality. ADRENAL GLANDS: No acute abnormality. KIDNEYS, URETERS AND BLADDER: Stable 4 mm nonobstructing calculus within the lower pole of the left kidney. No ureteral calculi. No hydronephrosis. No perinephric inflammatory stranding or fluid collections were identified. The bladder is unremarkable. Moderate bilateral renal cortical atrophy. GI AND BOWEL: Moderate sigmoid diverticulosis. Appendix normal. The stomach, small bowel, and large bowel are otherwise unremarkable. Moderate stool burden without evidence of obstruction. PERITONEUM AND RETROPERITONEUM: No ascites. No free air. VASCULATURE: Aorta is normal in caliber. Moderate aortoiliac atherosclerotic calcification. LYMPH NODES: No lymphadenopathy. REPRODUCTIVE ORGANS: No acute abnormality. BONES AND SOFT TISSUES: Moderate lumbar dextroscoliosis, apex to the right at L3. Osseous structures are age appropriate. No acute bone abnormality. No suspicious lytic or blastic bone lesion. 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 No focal soft tissue abnormality. IMPRESSION: 1. No evidence of bowel obstruction. 2. Moderate stool burden, without obstruction. 3. Moderate bilateral renal cortical atrophy, in keeping with chronic renal insufficiency . 4. Nonobstructing 4 mm left lower pole renal calculus, without hydronephrosis or ureteral calculus. 5. Moderate sigmoid diverticulosis, without diverticulitis. 6. Moderate aortoiliac atherosclerotic calcification. 7. Moderate lumbar dextroscoliosis centered at L3. 8. Stable circumscribed lytic lesion in the left femoral head-neck junction with dense central calcification, most consistent with an intraosseous lipoma. 9. Partially visualized leadless pacer at the right ventricular apex, new since prior examination . Electronically signed by: Dorethia Molt MD 06/03/2024 07:23 PM EST RP Workstation: [id number]   DG Chest 2 View Result Date: 06/03/2024 CLINICAL DATA:  Chest pain EXAM: CHEST - 2 VIEW COMPARISON:  05/22/2024 FINDINGS: Hyperinflation. A mild pectus excavatum deformity. Double-lumen right central line terminates at the low SVC and superior caval/atrial junction. Midline trachea. Normal heart size. Atherosclerosis in the transverse aorta. No pleural effusion or pneumothorax. Left-greater-than-right biapical pleuroparenchymal scarring. Lower lung predominant interstitial thickening is mild. No lobar consolidation. IMPRESSION: 1. Hyperinflation and interstitial thickening, likely related to COPD/chronic bronchitis. No acute superimposed process. 2. Aortic Atherosclerosis (ICD10-I70.0). Electronically Signed   By: Rockey Kilts M.D.   On: 06/03/2024 16:01      Medications:      amiodarone   100 mg Oral BID   apixaban   2.5 mg Oral BID   atorvastatin   40 mg Oral Daily   Chlorhexidine  Gluconate Cloth  6 each Topical Q0600   cyanocobalamin   2,500 mcg Oral Daily   dicyclomine    10 mg Oral TID AC   escitalopram   5 mg Oral Daily   feeding supplement (NEPRO CARB STEADY)  237 mL Oral TID BM   fluticasone  furoate-vilanterol  1 puff Inhalation Daily   ipratropium  0.5 mg Nebulization BID   metoCLOPramide  (REGLAN ) injection  5 mg Intravenous Q8H   oxyCODONE   10 mg Oral TID   pantoprazole   40 mg Oral Daily   polyethylene glycol  17 g Oral BID   tamsulosin   0.4 mg Oral Daily   torsemide   40 mg Oral Once per day on Sunday Tuesday Thursday Saturday   acetaminophen , albuterol , dextromethorphan -guaiFENesin , fentaNYL  (SUBLIMAZE ) injection, nitroGLYCERIN , oxyCODONE -acetaminophen  **AND** oxyCODONE , promethazine , traZODone   Assessment/ Plan:  Mr. Kanishk Stroebel Perlow is a 79 y.o.  male with past medical  conditions including COPD, BPH, atrial fibrillation on Eliquis , IBS-C, pacemaker, anemia, and chronic kidney disease stage IV.  Patient presents to the emergency department for chest pain and vomiting, admitted under observation for Generalized abdominal pain [R10.84] Burning chest pain [R07.89] Elevated troponin [R79.89] ESRD (end stage renal disease) on dialysis (HCC) [N18.6, Z99.2] Chest pain [R07.9] Nausea and vomiting, unspecified vomiting type [R11.2]   End-stage renal disease requiring hemodialysis. Patient started on hemodialysis during recent admission.  Patient receiving dialysis today, UF goal 1.5 L as tolerated.Next treatment scheduled for Saturday.    Lab Results  Component Value Date   CREATININE 5.50 (H) 06/05/2024   CREATININE 5.60 (H) 06/04/2024   CREATININE 5.27 (H) 06/03/2024    Intake/Output Summary (Last 24 hours) at 06/05/2024 1042 Last data filed at 06/05/2024 0636 Gross per 24 hour  Intake 673.46 ml  Output 500 ml  Net 173.46 ml   2. Anemia of chronic kidney disease Lab Results  Component Value Date   HGB 8.2 (L) 06/05/2024   Hgb 8.2,  Continue IV Retacrit  10000 units with dialysis.   3. Secondary Hyperparathyroidism: with outpatient labs:  PTH 66 on 03/04/2024, phosphorus 5.1, calcium  8.6 on 05/19/24.   Lab Results  Component Value Date   CALCIUM  8.4 (L) 06/05/2024   PHOS 4.3 05/29/2024    Will continue to monitor as outpatient.   LOS: 0 Riley Martin 1/29/202610:42 AM   "

## 2024-06-05 NOTE — Discharge Instructions (Addendum)
 I'm glad you are feeling better.  The evaluation by cardiology showed that your heart is functioning normally.  Please continue your heart medications with the addition of atorvastatin  for cholesterol to help prevent future heart issues.   Please reach out to your dialysis center to get back on the schedule for outpatient dialysis.   Make an appointment with your PCP in 1-2 weeks for hospital follow up  I have ordered home health to come to your home and work with you for improving your mobility.

## 2024-06-05 NOTE — Progress Notes (Signed)
 PT Cancellation Note  Patient Details Name: Riley Martin MRN: [id number] DOB: 04/11/1946   Cancelled Treatment:    Reason Eval/Treat Not Completed: PT screened, no needs identified, will sign off (Chart reviewed, evalautiona attempted. RN reports pt is in process fo DC at this time and that no PT eval needed. Will sign off.)  1:17 PM, 06/05/24 [name], PT, DPT Physical Therapist - [facility]  [phone] (ASCOM)     [name] C 06/05/2024, 1:17 PM

## 2024-06-05 NOTE — Progress Notes (Incomplete)
 " PROGRESS NOTE Riley Martin    DOB: 1946-04-27, 79 y.o.  FMW:[id number]    Code Status: Full Code   DOA: 06/03/2024   LOS: 0  Brief hospital course  Riley Martin is a 79 y.o. 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  Patient states that his last dialysis was 5 days ago.  He missed her dialysis today.   ED Course: troponin 232 --> 201, WBC 6.1, potassium 5.8, bicarbonate 27, creatinine 5.27, BUN 56, GFR 10. Negative UA. Temperature normal, blood pressure 106/56, heart rate 82, RR 20, oxygen saturation 94% on room air.  Chest x-ray showed COPD without infiltration.  CT of abdomen/pelvis is negative for acute intra-abdominal issues.     CT abdomen/pelvis: 1. No evidence of bowel obstruction. 2. Moderate stool burden, without obstruction. 3. Moderate bilateral renal cortical atrophy, in keeping with chronic renal insufficiency . 4. Nonobstructing 4 mm left lower pole renal calculus, without hydronephrosis or ureteral calculus. 5. Moderate sigmoid diverticulosis, without diverticulitis. 6. Moderate aortoiliac atherosclerotic calcification. 7. Moderate lumbar dextroscoliosis centered at L3. 8. Stable circumscribed lytic lesion in the left femoral head-neck junction with dense central calcification, most consistent with an intraosseous lipoma. 9. Partially visualized leadless pacer at the right ventricular apex, new since prior examination .  06/05/24 -cardiology consulted, nephrology consulted.  Assessment & Plan  Principal Problem:   Chest pain Active Problems:   ESRD on dialysis (HCC)   Hyperkalemia   Nausea and vomiting   COPD (chronic obstructive pulmonary disease) (HCC)   Iron  deficiency anemia due to chronic blood loss   Paroxysmal atrial fibrillation (HCC)   BPH (benign prostatic  hyperplasia)   Chronic pain   Depression   Protein-calorie malnutrition, severe  Chest pain: trop 232 --> 201.  Possible non-STEMI.  - f/u consult with cards to r/o ACS - remains on heparin  gtt and has no chest pain currently.  - stress test and echo today.  - prn fentanyl  for severe pain - prn Nitroglycerin  - Patient received 324 mg of aspirin  before arrival - Statin: Start Lipitor 40 mg daily   ESRD on dialysis (TTS): Last dialysis was 5 days ago with issues for his HD center with the weather. -Continue home torsemide  - Consulted Dr. Marcelino of renal. Will start back on HD tomorrow   Hyperkalemia: improved. K+ 5.8>4.9 -s/p 10 g of Lokelma     Nausea and vomiting and chronic abdominal pain: Etiology is not clear.  CT of abdomen/pelvis negative for acute intra-abdominal issues. - Pain control as above - As needed Zofran  and scheduled Reglan  5 mg 3 times daily   COPD (chronic obstructive pulmonary disease) (HCC): Stable -Bronchodilators and prn Mucinex    Iron  deficiency anemia due to chronic blood loss: Hemoglobin 8.4 (7.7 on 05/29/2024) Follow-up repeat CBC   Paroxysmal atrial fibrillation (HCC): Heart rate 80s -Switched Eliquis  to IV heparin  as above - Continue amiodarone    BPH (benign prostatic hyperplasia) - Flomax    Chronic pain -Continue home OxyContin  and Percocet   Depression -Lexapro  and trazodone    Protein-calorie malnutrition, severe: Body weight 48.5 kg, BMI 14.51 - Nutrition supplement  Body mass index is 19.85 kg/m.  VTE ppx: heparin  gtt  Diet:     Diet   Diet renal with fluid restriction Fluid restriction: 1500 mL Fluid; Room service appropriate? Yes;  Fluid consistency: Thin   Consultants: Cardiology Nephrology   Subjective 06/05/24    Pt reports feeling improved. Chest pain has resolved. Has some mild nausea but feels that he is hungry and can eat.    Objective  Blood pressure (!) 122/56, pulse 72, temperature 98.4 F (36.9 C),  temperature source Oral, resp. rate 12, height 6' (1.829 m), weight 48.5 kg, SpO2 100%.  Intake/Output Summary (Last 24 hours) at 06/05/2024 0720 Last data filed at 06/05/2024 0636 Gross per 24 hour  Intake 721.68 ml  Output 500 ml  Net 221.68 ml   Filed Weights   06/03/24 1501 06/05/24 0500  Weight: 48.5 kg 66.4 kg    Physical Exam: General: awake, alert, NAD HEENT: atraumatic, clear conjunctiva, anicteric sclera, MMM, hearing grossly normal Respiratory: normal respiratory effort. Cardiovascular: extremities well perfused, quick capillary refill, normal S1/S2, RRR, no JVD, murmurs. R tunneled cath in place Nervous: A&O x3. no gross focal neurologic deficits, normal speech Extremities: moves all equally, no edema, normal tone Skin: dry, intact, normal temperature, normal color. No rashes, lesions or ulcers on exposed skin Psychiatry: normal mood, congruent affect  Labs   I have personally reviewed the following labs and imaging studies CBC    Component Value Date/Time   WBC 6.0 06/05/2024 0459   RBC 2.43 (L) 06/05/2024 0459   HGB 8.2 (L) 06/05/2024 0459   HGB 8.7 (L) 04/09/2024 1058   HGB 12.7 (L) 06/14/2012 0656   HCT 25.2 (L) 06/05/2024 0459   HCT 38.5 (L) 06/14/2012 0656   PLT 162 06/05/2024 0459   PLT 207 04/09/2024 1058   PLT 290 06/14/2012 0656   MCV 103.7 (H) 06/05/2024 0459   MCV 97 06/14/2012 0656   MCH 33.7 06/05/2024 0459   MCHC 32.5 06/05/2024 0459   RDW 12.8 06/05/2024 0459   RDW 12.9 06/14/2012 0656   LYMPHSABS 1.7 05/29/2024 0724   MONOABS 0.5 05/29/2024 0724   EOSABS 0.5 05/29/2024 0724   BASOSABS 0.0 05/29/2024 0724      Latest Ref Rng & Units 06/05/2024    4:59 AM 06/04/2024    4:19 AM 06/03/2024    3:04 PM  BMP  Glucose 70 - 99 mg/dL 871  94  98   BUN 8 - 23 mg/dL 58  57  56   Creatinine 0.61 - 1.24 mg/dL 4.49  4.39  4.72   Sodium 135 - 145 mmol/L 137  133  131   Potassium 3.5 - 5.1 mmol/L 5.1  4.9  5.8   Chloride 98 - 111 mmol/L 98  94  93    CO2 22 - 32 mmol/L 27  26  27    Calcium  8.9 - 10.3 mg/dL 8.4  8.6  8.9     NM Myocar Multi W/Spect W/Wall Motion / EF Result Date: 06/04/2024   The study is normal. The study is low risk.   No ST deviation was noted.   Left ventricular function is normal. End diastolic cavity size is normal.   Coronary calcium  was present on the attenuation correction CT images.   ECHOCARDIOGRAM COMPLETE Result Date: 06/04/2024    ECHOCARDIOGRAM REPORT   Patient Name:   [facility] Date of Exam: 06/04/2024 Medical Rec #:  [id number]          Height:       72.0 in Accession #:    [id number]         Weight:       107.0 lb Date of Birth:  [birth year]          BSA:          1.633 m Patient Age:    78 years           BP:           122/56 mmHg Patient Gender: M                  HR:           72 bpm. Exam Location:  [facility] Procedure: 2D Echo, Cardiac Doppler and Color Doppler (Both Spectral and Color            Flow Doppler were utilized during procedure). Indications:     Chest pain R07.9  History:         Patient has prior history of Echocardiogram examinations, most                  recent 12/25/2020. Arrythmias:Atrial Fibrillation.  Sonographer:     Christopher Furnace Referring Phys:  [id number] CARALYN HUDSON Diagnosing Phys: Keller Paterson  Sonographer Comments: Technically challenging study due to limited acoustic windows, no parasternal window and no apical window. Image acquisition challenging due to patient body habitus and The only view obtainable was subcostal due to body habitus of pectus excavatum. IMPRESSIONS  1. Technically very difficult study with no parasternal or subcoastal views.  2. Left ventricle poorly visualized. Preserved LV systolic function based on limited subcoastal views. Cannot assess for accurate LVEF or wall motion on this study.  3. Right ventricular systolic function is normal. The right ventricular size is normal.  4. The mitral valve was not well visualized.  5. The aortic valve was not well  visualized. FINDINGS  Left Ventricle: Left ventricle poorly visualized. Preserved LV systolic function based on limited subcoastal views. Cannot assess for accurate LVEF or wall motion on this study. Right Ventricle: The right ventricular size is normal. No increase in right ventricular wall thickness. Right ventricular systolic function is normal. Left Atrium: Left atrial size was not well visualized. Right Atrium: Right atrial size was not well visualized. Pericardium: There is no evidence of pericardial effusion. Mitral Valve: The mitral valve was not well visualized. Tricuspid Valve: The tricuspid valve is not well visualized. Aortic Valve: The aortic valve was not well visualized. Pulmonic Valve: The pulmonic valve was not well visualized. Aorta: Aortic root could not be assessed. IAS/Shunts: The interatrial septum was not well visualized.  LEFT VENTRICLE PLAX 2D LVIDd:         3.40 cm LVIDs:         2.50 cm LV PW:         1.20 cm LV IVS:        0.90 cm  Keller Paterson Electronically signed by Keller Paterson Signature Date/Time: 06/04/2024/4:43:04 PM    Final    CT ABDOMEN PELVIS WO CONTRAST Result Date: 06/03/2024 EXAM: CT ABDOMEN AND PELVIS WITHOUT CONTRAST 06/03/2024 06:40:50 PM TECHNIQUE: CT of the abdomen and pelvis was performed without the administration of intravenous contrast. Multiplanar reformatted images are provided for review. Automated exposure control, iterative reconstruction, and/or weight-based adjustment of the mA/kV was utilized to reduce the radiation dose to as low as reasonably achievable. COMPARISON: 05/21/2024 and 02/22/2024. CLINICAL HISTORY: Bowel obstruction. Vomiting, chest pain. 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 FINDINGS: LOWER CHEST: New leadless pacer within the right ventricular apex, partially visualized. LIVER: The liver is unremarkable. GALLBLADDER AND BILE DUCTS: Gallbladder is unremarkable. No  biliary ductal dilatation. SPLEEN: No acute  abnormality. PANCREAS: No acute abnormality. ADRENAL GLANDS: No acute abnormality. KIDNEYS, URETERS AND BLADDER: Stable 4 mm nonobstructing calculus within the lower pole of the left kidney. No ureteral calculi. No hydronephrosis. No perinephric inflammatory stranding or fluid collections were identified. The bladder is unremarkable. Moderate bilateral renal cortical atrophy. GI AND BOWEL: Moderate sigmoid diverticulosis. Appendix normal. The stomach, small bowel, and large bowel are otherwise unremarkable. Moderate stool burden without evidence of obstruction. PERITONEUM AND RETROPERITONEUM: No ascites. No free air. VASCULATURE: Aorta is normal in caliber. Moderate aortoiliac atherosclerotic calcification. LYMPH NODES: No lymphadenopathy. REPRODUCTIVE ORGANS: No acute abnormality. BONES AND SOFT TISSUES: Moderate lumbar dextroscoliosis, apex to the right at L3. Osseous structures are age appropriate. No acute bone abnormality. No suspicious lytic or blastic bone lesion. 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 No focal soft tissue abnormality. IMPRESSION: 1. No evidence of bowel obstruction. 2. Moderate stool burden, without obstruction. 3. Moderate bilateral renal cortical atrophy, in keeping with chronic renal insufficiency . 4. Nonobstructing 4 mm left lower pole renal calculus, without hydronephrosis or ureteral calculus. 5. Moderate sigmoid diverticulosis, without diverticulitis. 6. Moderate aortoiliac atherosclerotic calcification. 7. Moderate lumbar dextroscoliosis centered at L3. 8. Stable circumscribed lytic lesion in the left femoral head-neck junction with dense central calcification, most consistent with an intraosseous lipoma. 9. Partially visualized leadless pacer at the right ventricular apex, new since prior examination . Electronically signed by: Dorethia Molt MD 06/03/2024  07:23 PM EST RP Workstation: [id number]   DG Chest 2 View Result Date: 06/03/2024 CLINICAL DATA:  Chest pain EXAM: CHEST - 2 VIEW COMPARISON:  05/22/2024 FINDINGS: Hyperinflation. A mild pectus excavatum deformity. Double-lumen right central line terminates at the low SVC and superior caval/atrial junction. Midline trachea. Normal heart size. Atherosclerosis in the transverse aorta. No pleural effusion or pneumothorax. Left-greater-than-right biapical pleuroparenchymal scarring. Lower lung predominant interstitial thickening is mild. No lobar consolidation. IMPRESSION: 1. Hyperinflation and interstitial thickening, likely related to COPD/chronic bronchitis. No acute superimposed process. 2. Aortic Atherosclerosis (ICD10-I70.0). Electronically Signed   By: Rockey Kilts M.D.   On: 06/03/2024 16:01   Disposition Plan & Communication  Patient status: Observation  Admitted From: Home Planned disposition location: Home Anticipated discharge date: 1/29 pending HD  Family Communication: wife at bedside    Author: Marien LITTIE Piety, DO [facility] 06/05/2024, 7:20 AM   Available by Epic secure chat 7AM-7PM. If 7PM-7AM, please contact night-coverage.  [facility] contact information found on [email or web address].  "
